# Patient Record
Sex: Female | Born: 1975 | Race: Black or African American | Hispanic: No | Marital: Single | Smoking: Current every day smoker
Health system: Southern US, Community
[De-identification: ages and names within clinical notes are randomized; demographics above are authoritative.]

## PROBLEM LIST (undated history)

## (undated) DIAGNOSIS — R51 Headache: Secondary | ICD-10-CM

## (undated) DIAGNOSIS — K859 Acute pancreatitis without necrosis or infection, unspecified: Secondary | ICD-10-CM

## (undated) DIAGNOSIS — K219 Gastro-esophageal reflux disease without esophagitis: Secondary | ICD-10-CM

## (undated) DIAGNOSIS — F32A Depression, unspecified: Secondary | ICD-10-CM

## (undated) DIAGNOSIS — F419 Anxiety disorder, unspecified: Secondary | ICD-10-CM

## (undated) DIAGNOSIS — D649 Anemia, unspecified: Secondary | ICD-10-CM

## (undated) DIAGNOSIS — R519 Headache, unspecified: Secondary | ICD-10-CM

## (undated) DIAGNOSIS — J45909 Unspecified asthma, uncomplicated: Secondary | ICD-10-CM

## (undated) DIAGNOSIS — M5430 Sciatica, unspecified side: Secondary | ICD-10-CM

## (undated) DIAGNOSIS — I10 Essential (primary) hypertension: Secondary | ICD-10-CM

## (undated) DIAGNOSIS — F40240 Claustrophobia: Secondary | ICD-10-CM

## (undated) DIAGNOSIS — M1990 Unspecified osteoarthritis, unspecified site: Secondary | ICD-10-CM

## (undated) DIAGNOSIS — C801 Malignant (primary) neoplasm, unspecified: Secondary | ICD-10-CM

## (undated) DIAGNOSIS — F329 Major depressive disorder, single episode, unspecified: Secondary | ICD-10-CM

## (undated) DIAGNOSIS — T7840XA Allergy, unspecified, initial encounter: Secondary | ICD-10-CM

## (undated) DIAGNOSIS — L0591 Pilonidal cyst without abscess: Secondary | ICD-10-CM

## (undated) HISTORY — DX: Allergy, unspecified, initial encounter: T78.40XA

## (undated) HISTORY — PX: SENTINEL LYMPH NODE BIOPSY: SHX2392

## (undated) HISTORY — PX: TUBAL LIGATION: SHX77

## (undated) HISTORY — PX: CYST EXCISION: SHX5701

## (undated) HISTORY — PX: CHOLECYSTECTOMY: SHX55

## (undated) HISTORY — DX: Essential (primary) hypertension: I10

## (undated) HISTORY — PX: ABDOMINAL HYSTERECTOMY: SHX81

## (undated) HISTORY — PX: HERNIA REPAIR: SHX51

---

## 1998-03-09 ENCOUNTER — Emergency Department (HOSPITAL_COMMUNITY): Admission: EM | Admit: 1998-03-09 | Discharge: 1998-03-09 | Payer: Self-pay | Admitting: Emergency Medicine

## 1998-03-09 ENCOUNTER — Encounter: Payer: Self-pay | Admitting: Emergency Medicine

## 1998-07-06 ENCOUNTER — Emergency Department (HOSPITAL_COMMUNITY): Admission: EM | Admit: 1998-07-06 | Discharge: 1998-07-06 | Payer: Self-pay | Admitting: Emergency Medicine

## 2008-11-05 ENCOUNTER — Emergency Department: Payer: Self-pay | Admitting: Emergency Medicine

## 2009-02-08 ENCOUNTER — Emergency Department: Payer: Self-pay | Admitting: Emergency Medicine

## 2009-05-13 ENCOUNTER — Emergency Department: Payer: Self-pay | Admitting: Emergency Medicine

## 2009-08-01 ENCOUNTER — Ambulatory Visit: Payer: Self-pay | Admitting: Family Medicine

## 2009-09-27 ENCOUNTER — Emergency Department: Payer: Self-pay | Admitting: Emergency Medicine

## 2012-02-03 ENCOUNTER — Emergency Department: Payer: Self-pay | Admitting: Emergency Medicine

## 2012-02-03 LAB — BASIC METABOLIC PANEL
Anion Gap: 6 — ABNORMAL LOW (ref 7–16)
BUN: 11 mg/dL (ref 7–18)
Calcium, Total: 9 mg/dL (ref 8.5–10.1)
Chloride: 105 mmol/L (ref 98–107)
Co2: 27 mmol/L (ref 21–32)
Creatinine: 1.14 mg/dL (ref 0.60–1.30)
EGFR (African American): >60
EGFR (Non-African Amer.): >60
Glucose: 89 mg/dL (ref 65–99)
Osmolality: 275 (ref 275–301)
Potassium: 3.4 mmol/L — ABNORMAL LOW (ref 3.5–5.1)
Sodium: 138 mmol/L (ref 136–145)

## 2012-02-03 LAB — CBC
HCT: 38.8 % (ref 35.0–47.0)
HGB: 13.1 g/dL (ref 12.0–16.0)
MCH: 29.4 pg (ref 26.0–34.0)
MCHC: 33.8 g/dL (ref 32.0–36.0)
MCV: 87 fL (ref 80–100)
Platelet: 503 10*3/uL — ABNORMAL HIGH (ref 150–440)
RBC: 4.46 10*6/uL (ref 3.80–5.20)
RDW: 13.5 % (ref 11.5–14.5)
WBC: 9.4 10*3/uL (ref 3.6–11.0)

## 2012-02-03 LAB — CK TOTAL AND CKMB (NOT AT ARMC)
CK, Total: 260 U/L — ABNORMAL HIGH (ref 21–215)
CK-MB: <0.5 ng/mL — ABNORMAL LOW (ref 0.5–3.6)

## 2012-02-03 LAB — TROPONIN I: Troponin-I: <0.02 ng/mL

## 2012-08-25 ENCOUNTER — Emergency Department: Payer: Self-pay | Admitting: Emergency Medicine

## 2012-08-25 LAB — URINALYSIS, COMPLETE
Bacteria: NONE SEEN
Bilirubin,UR: NEGATIVE
Blood: NEGATIVE
Glucose,UR: NEGATIVE mg/dL (ref 0–75)
Ketone: NEGATIVE
Leukocyte Esterase: NEGATIVE
Nitrite: NEGATIVE
Ph: 7 (ref 4.5–8.0)
Protein: NEGATIVE
RBC,UR: <1 /HPF (ref 0–5)
Specific Gravity: 1.004 (ref 1.003–1.030)
Squamous Epithelial: 1
WBC UR: <1 /HPF (ref 0–5)

## 2012-08-25 LAB — CBC
HCT: 35.9 % (ref 35.0–47.0)
HGB: 12.7 g/dL (ref 12.0–16.0)
MCH: 30.7 pg (ref 26.0–34.0)
MCHC: 35.3 g/dL (ref 32.0–36.0)
MCV: 87 fL (ref 80–100)
Platelet: 374 10*3/uL (ref 150–440)
RBC: 4.12 10*6/uL (ref 3.80–5.20)
RDW: 13 % (ref 11.5–14.5)
WBC: 7.6 10*3/uL (ref 3.6–11.0)

## 2012-08-25 LAB — COMPREHENSIVE METABOLIC PANEL
Albumin: 4.1 g/dL (ref 3.4–5.0)
Alkaline Phosphatase: 62 U/L (ref 50–136)
Anion Gap: 5 — ABNORMAL LOW (ref 7–16)
BUN: 9 mg/dL (ref 7–18)
Bilirubin,Total: 0.3 mg/dL (ref 0.2–1.0)
Calcium, Total: 9.4 mg/dL (ref 8.5–10.1)
Chloride: 103 mmol/L (ref 98–107)
Co2: 30 mmol/L (ref 21–32)
Creatinine: 0.8 mg/dL (ref 0.60–1.30)
EGFR (African American): >60
EGFR (Non-African Amer.): >60
Glucose: 77 mg/dL (ref 65–99)
Osmolality: 273 (ref 275–301)
Potassium: 3.2 mmol/L — ABNORMAL LOW (ref 3.5–5.1)
SGOT(AST): 32 U/L (ref 15–37)
SGPT (ALT): 27 U/L (ref 12–78)
Sodium: 138 mmol/L (ref 136–145)
Total Protein: 8.1 g/dL (ref 6.4–8.2)

## 2012-08-25 LAB — TSH: Thyroid Stimulating Horm: 1.16 u[IU]/mL

## 2012-08-25 LAB — TROPONIN I: Troponin-I: <0.02 ng/mL

## 2012-12-29 ENCOUNTER — Emergency Department: Payer: Self-pay | Admitting: Emergency Medicine

## 2013-06-19 ENCOUNTER — Emergency Department: Payer: Self-pay | Admitting: Emergency Medicine

## 2013-10-13 ENCOUNTER — Emergency Department: Payer: Self-pay

## 2014-01-30 ENCOUNTER — Emergency Department: Payer: Self-pay | Admitting: Emergency Medicine

## 2014-03-23 ENCOUNTER — Emergency Department: Payer: Self-pay | Admitting: Emergency Medicine

## 2014-06-03 ENCOUNTER — Emergency Department: Payer: Medicaid Other

## 2014-06-03 ENCOUNTER — Emergency Department
Admission: EM | Admit: 2014-06-03 | Discharge: 2014-06-03 | Disposition: A | Discharge status: Home / Self Care | Payer: Medicaid Other | Attending: Emergency Medicine | Admitting: Emergency Medicine

## 2014-06-03 DIAGNOSIS — S93401A Sprain of unspecified ligament of right ankle, initial encounter: Secondary | ICD-10-CM | POA: Insufficient documentation

## 2014-06-03 DIAGNOSIS — Y9289 Other specified places as the place of occurrence of the external cause: Secondary | ICD-10-CM | POA: Diagnosis not present

## 2014-06-03 DIAGNOSIS — Y998 Other external cause status: Secondary | ICD-10-CM | POA: Diagnosis not present

## 2014-06-03 DIAGNOSIS — Y9389 Activity, other specified: Secondary | ICD-10-CM | POA: Diagnosis not present

## 2014-06-03 DIAGNOSIS — S82445A Nondisplaced spiral fracture of shaft of left fibula, initial encounter for closed fracture: Secondary | ICD-10-CM | POA: Diagnosis not present

## 2014-06-03 DIAGNOSIS — W109XXA Fall (on) (from) unspecified stairs and steps, initial encounter: Secondary | ICD-10-CM | POA: Diagnosis not present

## 2014-06-03 DIAGNOSIS — S99912A Unspecified injury of left ankle, initial encounter: Secondary | ICD-10-CM | POA: Diagnosis present

## 2014-06-03 MED ORDER — OXYCODONE-ACETAMINOPHEN 5-325 MG PO TABS: 1.0000 | ORAL_TABLET | Freq: Four times a day (QID) | ORAL | Status: DC | PRN

## 2014-06-03 MED ORDER — OXYCODONE-ACETAMINOPHEN 5-325 MG PO TABS
ORAL_TABLET | ORAL | Status: AC
  Administered 2014-06-03: 1 via ORAL
  Filled 2014-06-03: qty 1

## 2014-06-03 MED ORDER — OXYCODONE-ACETAMINOPHEN 5-325 MG PO TABS
1.0000 | ORAL_TABLET | Freq: Once | ORAL | Status: AC
  Administered 2014-06-03: 1 via ORAL

## 2014-06-03 NOTE — Discharge Instructions (Signed)
Fibular Fracture, Ankle, Adult, Undisplaced, Treated With Immobilization °A simple fracture of the bone below the knee on the outside of your leg (fibula) usually heals without problems. °CAUSES °Typically, a fibular fracture occurs as a result of trauma. A blow to the side of your leg or a powerful twisting movement can cause a fracture. Fibular fractures are often seen as a result of football, soccer, or skiing injuries. °SYMPTOMS °Symptoms of a fibular fracture can include: °· Pain. °· Shortening or abnormal alignment of your lower leg (angulation). °DIAGNOSIS °A health care provider will need to examine the leg. X-ray exams will be ordered for further to confirm the fracture and evaluate the extent and of the injury. °TREATMENT  °Typically, a cast or immobilizer is applied. Sometimes a splint is placed on these fractures if it is needed for comfort or if the bones are badly out of place. Crutches may be needed to help you get around.  °HOME CARE INSTRUCTIONS  °· Apply ice to the injured area: °¨ Put ice in a plastic bag. °¨ Place a towel between your skin and the bag. °¨ Leave the ice on for 20 minutes, 2-3 times a day. °· Use crutches as directed. Resume walking without crutches as directed by your health care provider or when comfortable doing so. °· Only take over-the-counter or prescription medicines for pain, discomfort, or fever as directed by your health care provider. °· Keeping your leg raised may lessen swelling. °· If you have a removable splint or boot, do not remove the boot unless directed by your health care provider. °· Do not not drive a car or operate a motor vehicle until your health care provider specifically tells you it is safe to do so. °SEEK IMMEDIATE MEDICAL CARE IF:  °· Your cast gets damaged or breaks. °· You have continued severe pain or more swelling than you did before the cast was put on, or the pain is not controlled with medications. °· Your skin or nails below the injury turn  blue or grey, or feel cold or numb. °· There is a bad smell or pus coming from under the cast. °· You develop severe pain in ankle or foot. °MAKE SURE YOU:  °· Understand these instructions. °· Will watch your condition. °· Will get help right away if you are not doing well or get worse. °Document Released: 10/03/2001 Document Revised: 11/01/2012 Document Reviewed: 08/23/2012 °[employer]® Patient Information ©2015 [employer]. This information is not intended to replace advice given to you by your health care provider. Make sure you discuss any questions you have with your health care provider. ° °

## 2014-06-03 NOTE — ED Notes (Signed)
Called patient to go back to a room

## 2014-06-03 NOTE — ED Notes (Signed)
Pt c/o left ankle pain , states she fell down some step yesterday

## 2014-06-03 NOTE — ED Provider Notes (Signed)
[facility] Emergency Department Provider Note  ____________________________________________  Time seen: 1:45 PM  I have reviewed the triage vital signs and the nursing notes.   HISTORY  Chief Complaint Ankle Pain      HPI Aylin L Parrales is a 39 y.o. female who presents with bilateral ankle pain after reportedly falling down the stairs last night. She is able to walk but it is painful. Her left ankle is swollen and more painful than the right. She describes the pain as moderate to severe and sharp in nature.     No past medical history on file.  There are no active problems to display for this patient.   No past surgical history on file.  No current outpatient prescriptions on file.  Allergies Review of patient's allergies indicates not on file.  No family history on file.  Social History History  Substance Use Topics  . Smoking status: Not on file  . Smokeless tobacco: Not on file  . Alcohol Use: Not on file    Review of Systems  Constitutional: Negative for fever. Eyes: Negative for visual changes. ENT: Negative for sore throat. Cardiovascular: Negative for chest pain. Respiratory: Negative for shortness of breath. Gastrointestinal: Negative for abdominal pain, vomiting and diarrhea. Genitourinary: Negative for dysuria. Musculoskeletal: Negative for back pain. Positive for ankle pain Skin: Negative for rash. Neurological: Negative for headaches, focal weakness or numbness. Psychiatric: No anxiety  10-point ROS otherwise negative.  ____________________________________________   PHYSICAL EXAM:  VITAL SIGNS: ED Triage Vitals  Enc Vitals Group     BP 06/03/14 1218 138/97 mmHg     Pulse Rate 06/03/14 1218 68     Resp 06/03/14 1218 16     Temp 06/03/14 1218 98.6 F (37 C)     Temp Source 06/03/14 1218 Oral     SpO2 06/03/14 1218 97 %     Weight 06/03/14 1218 180 lb (81.647 kg)     Height 06/03/14 1218 5\' 5"  (1.651 m)      Head Cir --      Peak Flow --      Pain Score 06/03/14 1220 10     Pain Loc --      Pain Edu? --      Excl. in [place]? --      Constitutional: Alert and oriented. Well appearing and in no distress. Eyes: Conjunctivae are normal. PERRL. Normal extraocular movements. ENT   Head: Normocephalic and atraumatic.   Nose: No congestion/rhinnorhea.   Mouth/Throat: Mucous membranes are moist.   Neck: No stridor. Hematological/Lymphatic/Immunilogical: No cervical lymphadenopathy. Cardiovascular: Normal rate, regular rhythm. Normal and symmetric distal pulses are present in all extremities. No murmurs, rubs, or gallops. Respiratory: Normal respiratory effort without tachypnea nor retractions. Breath sounds are clear and equal bilaterally. No wheezes/rales/rhonchi. Gastrointestinal: Soft and nontender. No distention. There is no CVA tenderness. Genitourinary: deferred Musculoskeletal: Left ankle with swelling and tenderness to palpation over the  lateral ankle. Right ankle no swelling but tenderness to palpation of the lateral ankle. Full range of motion although painful able to bear weight. Neurologic:  Normal speech and language. No gross focal neurologic deficits are appreciated. Speech is normal.  Skin:  Skin is warm, dry and intact. No rash noted. Psychiatric: Mood and affect are normal. Speech and behavior are normal. Patient exhibits appropriate insight and judgment.  ____________________________________________    LABS (pertinent positives/negatives)    ____________________________________________   EKG    ____________________________________________    RADIOLOGY  Right ankle  no acute distress Left ankle spiral fracture  ____________________________________________   PROCEDURES  Procedure(s) performed: yes SPLINT APPLICATION Date/Time: 0:37 PM Authorized by: Lavonia Drafts Consent: Verbal consent obtained. Risks and benefits: risks, benefits and  alternatives were discussed Consent given by: patient Splint applied by: me Location details: left ankle Splint type: posterior and sugar tong Supplies used: orthoglass Post-procedure: The splinted body part was neurovascularly unchanged following the procedure. Patient tolerance: Patient tolerated the procedure well with no immediate complications.     Critical Care performed: no  ____________________________________________   INITIAL IMPRESSION / ASSESSMENT AND PLAN / ED COURSE  Pertinent labs & imaging results that were available during my care of the patient were reviewed by me and considered in my medical decision making (see chart for details).  Patient with left ankle fracture, will require splint and crutches and orthopedic follow-up as well as analgesics. Patient made aware  ____________________________________________   FINAL CLINICAL IMPRESSION(S) / ED DIAGNOSES  Final diagnoses:  Right ankle sprain, initial encounter  Nondisplaced spiral fracture of shaft of left fibula, closed, initial encounter     Lavonia Drafts, MD 06/03/14 1444

## 2014-06-03 NOTE — ED Notes (Signed)
Pt unable to sign d/c instructions.  No e signature available in room 30

## 2015-04-13 ENCOUNTER — Emergency Department: Payer: Medicaid Other

## 2015-04-13 ENCOUNTER — Emergency Department
Admission: EM | Admit: 2015-04-13 | Discharge: 2015-04-13 | Disposition: A | Discharge status: Home / Self Care | Payer: Medicaid Other | Attending: Emergency Medicine | Admitting: Emergency Medicine

## 2015-04-13 DIAGNOSIS — R1033 Periumbilical pain: Secondary | ICD-10-CM | POA: Diagnosis not present

## 2015-04-13 DIAGNOSIS — Y998 Other external cause status: Secondary | ICD-10-CM | POA: Insufficient documentation

## 2015-04-13 DIAGNOSIS — Y9389 Activity, other specified: Secondary | ICD-10-CM | POA: Insufficient documentation

## 2015-04-13 DIAGNOSIS — S6992XA Unspecified injury of left wrist, hand and finger(s), initial encounter: Secondary | ICD-10-CM | POA: Diagnosis not present

## 2015-04-13 DIAGNOSIS — W228XXA Striking against or struck by other objects, initial encounter: Secondary | ICD-10-CM | POA: Insufficient documentation

## 2015-04-13 DIAGNOSIS — R1013 Epigastric pain: Secondary | ICD-10-CM | POA: Insufficient documentation

## 2015-04-13 DIAGNOSIS — Z3202 Encounter for pregnancy test, result negative: Secondary | ICD-10-CM | POA: Diagnosis not present

## 2015-04-13 DIAGNOSIS — Y9289 Other specified places as the place of occurrence of the external cause: Secondary | ICD-10-CM | POA: Diagnosis not present

## 2015-04-13 LAB — COMPREHENSIVE METABOLIC PANEL
ALT: 19 U/L (ref 14–54)
ANION GAP: 5 (ref 5–15)
AST: 20 U/L (ref 15–41)
Albumin: 4.2 g/dL (ref 3.5–5.0)
Alkaline Phosphatase: 54 U/L (ref 38–126)
BUN: 9 mg/dL (ref 6–20)
CHLORIDE: 109 mmol/L (ref 101–111)
CO2: 23 mmol/L (ref 22–32)
CREATININE: 0.89 mg/dL (ref 0.44–1.00)
Calcium: 8.8 mg/dL — ABNORMAL LOW (ref 8.9–10.3)
GFR calc non Af Amer: >60 mL/min (ref >60)
Glucose, Bld: 90 mg/dL (ref 65–99)
POTASSIUM: 3.7 mmol/L (ref 3.5–5.1)
Sodium: 137 mmol/L (ref 135–145)
Total Bilirubin: <0.1 mg/dL — ABNORMAL LOW (ref 0.3–1.2)
Total Protein: 7.5 g/dL (ref 6.5–8.1)

## 2015-04-13 LAB — CBC
HCT: 34.9 % — ABNORMAL LOW (ref 35.0–47.0)
HEMOGLOBIN: 11.9 g/dL — AB (ref 12.0–16.0)
MCH: 30.3 pg (ref 26.0–34.0)
MCHC: 34.1 g/dL (ref 32.0–36.0)
MCV: 88.9 fL (ref 80.0–100.0)
Platelets: 352 10*3/uL (ref 150–440)
RBC: 3.93 MIL/uL (ref 3.80–5.20)
RDW: 13.8 % (ref 11.5–14.5)
WBC: 9.9 10*3/uL (ref 3.6–11.0)

## 2015-04-13 LAB — URINALYSIS COMPLETE WITH MICROSCOPIC (ARMC ONLY)
BACTERIA UA: NONE SEEN
Bilirubin Urine: NEGATIVE
Glucose, UA: NEGATIVE mg/dL
HGB URINE DIPSTICK: NEGATIVE
Ketones, ur: NEGATIVE mg/dL
Nitrite: NEGATIVE
PH: 6 (ref 5.0–8.0)
Protein, ur: NEGATIVE mg/dL
Specific Gravity, Urine: 1.019 (ref 1.005–1.030)

## 2015-04-13 LAB — POCT PREGNANCY, URINE: Preg Test, Ur: NEGATIVE

## 2015-04-13 LAB — LIPASE, BLOOD: LIPASE: 20 U/L (ref 11–51)

## 2015-04-13 NOTE — Discharge Instructions (Signed)

## 2015-04-13 NOTE — ED Provider Notes (Signed)
Time Seen: Approximately ----------------------------------------- 7:36 PM on 04/13/2015 -----------------------------------------    I have reviewed the triage notes  Chief Complaint: Abdominal Pain   History of Present Illness: Cassidy Mathis is a 40 y.o. female who presents with multiple concerns including some. Umbilical abdominal pain toward the epigastric area that's been occurring now for the last couple weeks. She states she's noticed the pain more with lifting and straining. She states she's also noticed a knot over this area which seems to come and go and was concerned because of some consistent pain. Patient states the pain was worse last night and actually at the time of our evaluation is feeling symptomatically improved. He denies any persistent nausea, vomiting, fever. She denies any lower abdominal pain.  Patient's secondary complaint is some pain in her middle finger. She states she punched something approximately a week ago and is noticed some persistent pain and swelling over the proximal interphalangeal joint of her left hand.   No past medical history on file.  There are no active problems to display for this patient.   No past surgical history on file. None No past surgical history on file.  Current Outpatient Rx  Name  Route  Sig  Dispense  Refill  . oxyCODONE-acetaminophen (PERCOCET/ROXICET) 5-325 MG per tablet   Oral   Take 1 tablet by mouth every 6 (six) hours as needed for severe pain.   24 tablet   0     Allergies:  Review of patient's allergies indicates no known allergies.  Family History: Mother had a history of gallstones Social History: Social History  Substance Use Topics  . Smoking status: Not on file  . Smokeless tobacco: Not on file  . Alcohol Use: Not on file    Review of Systems:   10 point review of systems was performed and was otherwise negative:  Constitutional: No fever Eyes: No visual disturbances ENT: No sore  throat, ear pain Cardiac: No chest pain Respiratory: No shortness of breath, wheezing, or stridor Abdomen: Abdominal pain is primarily toward an area above the umbilicus without any radiation of pain to the back or flank area. Endocrine: No weight loss, No night sweats Extremities: No peripheral edema, cyanosis Skin: No rashes, easy bruising Neurologic: No focal weakness, trouble with speech or swollowing Urologic: No dysuria, Hematuria, or urinary frequency   Physical Exam:  ED Triage Vitals  Enc Vitals Group     BP 04/13/15 1644 166/99 mmHg     Pulse Rate 04/13/15 1644 70     Resp 04/13/15 1644 18     Temp 04/13/15 1644 98.8 F (37.1 C)     Temp Source 04/13/15 1644 Oral     SpO2 04/13/15 1644 100 %     Weight 04/13/15 1644 180 lb (81.647 kg)     Height 04/13/15 1644 5\' 5"  (1.651 m)     Head Cir --      Peak Flow --      Pain Score 04/13/15 1645 8     Pain Loc --      Pain Edu? --      Excl. in [place]? --     General: Awake , Alert , and Oriented times 3; GCS 15 Head: Normal cephalic , atraumatic Eyes: Pupils equal , round, reactive to light Nose/Throat: No nasal drainage, patent upper airway without erythema or exudate.  Neck: Supple, Full range of motion, No anterior adenopathy or palpable thyroid masses Lungs: Clear to ascultation without wheezes , rhonchi,  or rales Heart: Regular rate, regular rhythm without murmurs , gallops , or rubs Abdomen: Soft, mild tenderness toward the left upper and epigastric area without  rebound, guarding , or rigidity; bowel sounds positive and symmetric in all 4 quadrants. No organomegaly .       Negative Murphy's sign. No tenderness over McBurney's point. No palpable hernia at this time Extremities: Patient has some tenderness over the left middle finger at the proximal joint without any crepitus or step-off noted and neurovascularly intact. Tenderness does extend to the distal interphalangeal joint. Neurologic: normal ambulation, Motor  symmetric without deficits, sensory intact Skin: warm, dry, no rashes   Labs:   All laboratory work was reviewed including any pertinent negatives or positives listed below:  Labs Reviewed  COMPREHENSIVE METABOLIC PANEL - Abnormal; Notable for the following:    Calcium 8.8 (*)    Total Bilirubin <0.1 (*)    All other components within normal limits  CBC - Abnormal; Notable for the following:    Hemoglobin 11.9 (*)    HCT 34.9 (*)    All other components within normal limits  URINALYSIS COMPLETEWITH MICROSCOPIC ([facility] ONLY) - Abnormal; Notable for the following:    Color, Urine YELLOW (*)    APPearance HAZY (*)    Leukocytes, UA TRACE (*)    Squamous Epithelial / LPF 6-30 (*)    All other components within normal limits  LIPASE, BLOOD  POC URINE PREG, ED  POCT PREGNANCY, URINE  Laboratory work was reviewed and showed no clinically significant abnormalities.     Radiology:  CLINICAL DATA: Epigastric pain  EXAM: US ABDOMEN LIMITED - RIGHT UPPER QUADRANT  COMPARISON: None.  FINDINGS: Gallbladder:  Increased echogenicity is noted along the margin of the gallbladder consistent with a contracted gallbladder with stones. No pericholecystic fluid is noted. Negative sonographic Murphy's sign.  Common bile duct:  Diameter: 4 mm.  Liver:  No focal lesion identified. Within normal limits in parenchymal echogenicity.  IMPRESSION: Echogenic gallstones within a decompressed gallbladder.   Electronically Signed By: Inez Catalina M.D. On: 04/13/2015 18:33          DG Finger Middle Left (Final result) Result time: 04/13/15 18:21:25   Final result by Rad Results In Interface (04/13/15 18:21:25)   Narrative:   CLINICAL DATA: Middle finger pain after punching a wall several days ago.  EXAM: LEFT MIDDLE FINGER 2+V  COMPARISON: None.  FINDINGS: Faint oblique linear lucency along the radial metaphysis of the middle phalanx of the middle finger on the  oblique view only is not corroborated on the other views and there is no significant adjacent soft tissue swelling, so I suspect this is likely incidental/artifact rather than a fracture. Correlate with point tenderness.  No dislocation or foreign body. No additional significant findings.  IMPRESSION: 1. I do not see a definite fracture. Faint linear lucency along the distal metaphysis of the middle phalanx is only suggested on a single view and not corroborated on the other views or by adjacent soft tissue swelling, so is probably artifact. If the patient happens to have point tenderness exactly in this location, then the possibility of a nondisplaced fracture might be increased in likelihood.           I personally reviewed the radiologic studies     ED Course: * Patient's stay was uneventful and she really didn't have much in way of significant pain on exam. I felt given her clinical history she may have an umbilical type hernia  that may be intermittent and reducible. There certainly doesn't appear to be any signs of entrapment or incarceration. Have gallstones on her ultrasound but no evidence of acute cholecystitis at this time. The patient was referred to general surgery due to both of her concerns. She was advised to return here if she develops a fever increased pain that's unrelieved with over-the-counter pain medications, bloody stool, or any other concerns. She was discharged with instructions on abdominal pain in a female patient along with instructions on hernia and gallstones.  Fourth finger if there is a fracture she was advised for pain control to buddy tape her fingers together at this point. He felt she did not require splint or casting. The bones are well aligned.    Assessment: * Acute unspecified epigastric abdominal pain Possible nondisplaced finger fracture  Final Clinical Impression  Final diagnoses:  Epigastric abdominal pain     Plan:    Outpatient management Patient was advised to return immediately if condition worsens. Patient was advised to follow up with their primary care physician or other specialized physicians involved in their outpatient care. The patient and/or family member/power of attorney had laboratory results reviewed at the bedside. All questions and concerns were addressed and appropriate discharge instructions were distributed by the nursing staff. Patient was consulted on the logistics of not prescribing narcotic pain medication just using over-the-counter pain meds at this time. We discussed the creation of constipation and side effects with using narcotic pain medication.           Daymon Larsen, MD 04/13/15 [phone]

## 2015-04-13 NOTE — ED Notes (Signed)
Pt states a couple weeks ago she noticed a large knot to the lower right side of her abd, pt states it has been painful intermittently. Pt also reports diarrhea intermittently.

## 2015-04-13 NOTE — ED Notes (Signed)
Pt back from x-ray.

## 2015-04-14 ENCOUNTER — Ambulatory Visit: Payer: Self-pay | Admitting: Surgery

## 2015-04-15 ENCOUNTER — Ambulatory Visit (INDEPENDENT_AMBULATORY_CARE_PROVIDER_SITE_OTHER): Payer: Medicaid Other | Admitting: Surgery

## 2015-04-15 ENCOUNTER — Encounter: Payer: Self-pay | Admitting: Surgery

## 2015-04-15 VITALS — BP 146/98 | HR 70 | Temp 98.4°F | Ht 65.0 in | Wt 188.0 lb

## 2015-04-15 DIAGNOSIS — K436 Other and unspecified ventral hernia with obstruction, without gangrene: Secondary | ICD-10-CM | POA: Diagnosis not present

## 2015-04-15 NOTE — Progress Notes (Signed)
Patient ID: Cassidy Mathis, female   DOB: 12-21-1975, 40 y.o.   MRN: [id number]  History of Present Illness Cassidy Mathis is a 40 y.o. female with acute onset of abdominal pain for last couple weeks. She recently went to the emergency room where workup revealed some evidence of gallstones. She describes that her pain is actually on top of her umbilicus, is sharp and constant and is moderate to severe in intensity. And there is increasing pain when she moves around and there is no specific alleviating factors. She is able to perform more than 4 Mets of activity without shortness of breath or chest pain. She denies any previous abdominal surgeries. She denies any evidence of biliary obstruction or gallbladder related symptoms.  Past Medical History Past Medical History  Diagnosis Date  . Hypertension      Past Surgical History  Procedure Laterality Date  . Cyst excision Left 1998    wrist  . Cyst excision  1998    tongue    No Known Allergies  Current Outpatient Prescriptions  Medication Sig Dispense Refill  . lisinopril-hydrochlorothiazide (PRINZIDE,ZESTORETIC) 20-25 MG tablet Take 1 tablet by mouth 1 day or 1 dose.  0   No current facility-administered medications for this visit.    Family History Family History  Problem Relation Age of Onset  . Hypertension Mother   . Kidney Stones Mother   . Gallstones Mother   . Hypertension Father   . Gout Father   . Cancer Paternal Uncle     lung  . Cancer Maternal Grandmother     not sure what type of cancer  . Cancer Maternal Grandfather     not sure what type of cancer  . Cancer Paternal Grandmother     not sure what type of cancer  . Cancer Paternal Grandfather     not sure what type of cancer     Social History Social History  Substance Use Topics  . Smoking status: Current Every Day Smoker  . Smokeless tobacco: Never Used  . Alcohol Use: 0.0 oz/week    0 Standard drinks or equivalent per week     Comment: Barely       ROS 10 point  review of system was performed otherwise negative other than what is stated in the history of present illness   Physical Exam Blood pressure 146/98, pulse 70, temperature 98.4 F (36.9 C), temperature source Oral, height 5\' 5"  (1.651 m), weight 85.276 kg (188 lb), last menstrual period 04/15/2015.  CONSTITUTIONAL: No acute distress awake alert well-developed EYES: Pupils equal, round, and reactive to light, Sclera non-icteric. EARS, NOSE, MOUTH AND THROAT: The oropharynx is clear. Oral mucosa is pink and moist. Hearing is intact to voice.  NECK: Trachea is midline, and there is no jugular venous distension. Thyroid is without palpable abnormalities. LYMPH NODES:  Lymph nodes in the neck are not enlarged. RESPIRATORY:  Lungs are clear, and breath sounds are equal bilaterally. Normal respiratory effort without pathologic use of accessory muscles. CARDIOVASCULAR: Heart is regular without murmurs, gallops, or rubs. GI: The abdomen is  soft, tender incarcerated Epigastric hernia , non distended, no peritonitis. There was no hepatosplenomegaly. There were normal bowel sounds. MUSCULOSKELETAL:  Normal muscle strength and tone in all four extremities.    SKIN: Skin turgor is normal. There are no pathologic skin lesions.  NEUROLOGIC:  Motor and sensation is grossly normal.  Cranial nerves are grossly intact. PSYCH:  Alert and oriented to person, place and time.  Affect is normal.  Data Reviewed  I have personally reviewed the patient's imaging and medical records.    Assessment/Plan Incarcerated epigastric hernia likely omentum or preperitoneal fat given her clinical presentation. Oh evidence of bowel compromise. She has been hurting for a couple weeks and had a normal white count. Now she does have a history of cholelithiasis but these are asymptomatic. Her current symptoms are definitely related to the epigastric hernia and not to her gallbladder. Had a lengthy discussion about  the patient about options and one of the doing simultaneous laparoscopic cholecystectomy and hernia repair versus doing only the hernia repair. I did explain to her that this symptomatic issue is the epigastric hernia and actually recommended to have this performed only so we can safely placed mesh. She and she understands that in the future her gallstones may become symptomatic at that time we'll we'll address that with a cholecystectomy. At this point I only thing that she needs the epigastric hernia repair. Since it counseling provided. I have explained the procedure, risks, and aftercare of  hernia repair to Cassidy Mathis.   Risks include but are not limited to bleeding, infection, wound problems, anesthesia, recurrence, bladder or intestine injury, urinary retention, chronic pain, mesh problems.  She  seems to understand and agrees to proceed.  Questions were answered to her stated satisfaction.   Cassidy Hamman, MD [place] 04/15/2015, 3:17 PM

## 2015-04-15 NOTE — Patient Instructions (Signed)
We will arrange for your surgery either 3/24 or 4/4. We are waiting to hear back from the Operating Room. As soon as your surgery date has been confirmed, our office will call you to let you know when to be here.  Please send your FMLA paperwork to our office. Fax: [phone]. We will fill this out the day of your surgery and return to your job. You are going to need to be out of work for 6 weeks following this surgery.  Please call with any questions or concerns.

## 2015-04-16 ENCOUNTER — Encounter: Payer: Self-pay | Admitting: *Deleted

## 2015-04-16 ENCOUNTER — Other Ambulatory Visit: Payer: Medicaid Other

## 2015-04-16 ENCOUNTER — Telehealth: Payer: Self-pay | Admitting: Surgery

## 2015-04-16 NOTE — Patient Instructions (Signed)
  Your procedure is scheduled on: 04-18-15 (FRIDAY) Report to [place] To find out your arrival time please call [phone] between 1PM - 3PM on 04-17-15 (THURSDAY)  Remember: Instructions that are not followed completely may result in serious medical risk, up to and including death, or upon the discretion of your surgeon and anesthesiologist your surgery may need to be rescheduled.    _X___ 1. Do not eat food or drink liquids after midnight. No gum chewing or hard candies.     _X___ 2. No Alcohol for 24 hours before or after surgery.   ____ 3. Bring all medications with you on the day of surgery if instructed.    _X___ 4. Notify your doctor if there is any change in your medical condition     (cold, fever, infections).     Do not wear jewelry, make-up, hairpins, clips or nail polish.  Do not wear lotions, powders, or perfumes. You may wear deodorant.  Do not shave 48 hours prior to surgery. Men may shave face and neck.  Do not bring valuables to the hospital.    [facility] is not responsible for any belongings or valuables.               Contacts, dentures or bridgework may not be worn into surgery.  Leave your suitcase in the car. After surgery it may be brought to your room.  For patients admitted to the hospital, discharge time is determined by your treatment team.   Patients discharged the day of surgery will not be allowed to drive home.   Please read over the following fact sheets that you were given:      ____ Take these medicines the morning of surgery with A SIP OF WATER:    1. NONE  2.   3.   4.  5.  6.  ____ Fleet Enema (as directed)   _X___ Use CHG Soap as directed  ____ Use inhalers on the day of surgery  ____ Stop metformin 2 days prior to surgery    ____ Take 1/2 of usual insulin dose the night before surgery and none on the morning of surgery.   ____ Stop Coumadin/Plavix/aspirin-N/A  ____ Stop Anti-inflammatories-NO  NSAIDS OR ASA PRODUCTS-TYLENOL OK TO TAKE   ____ Stop supplements until after surgery.    ____ Bring C-Pap to the hospital.

## 2015-04-16 NOTE — Telephone Encounter (Signed)
Pt advised of pre op date/time and sx date. Sx: 04/18/15 with Dr Carolin Coy epigastric hernia repair.  Pre op: 04/16/15 between 1-5:00pm--Phone.   Patient made aware to call [phone], between 1-3:00pm the day before surgery, to find out what time to arrive.

## 2015-04-17 ENCOUNTER — Encounter
Admission: RE | Admit: 2015-04-17 | Discharge: 2015-04-17 | Disposition: A | Discharge status: Home / Self Care | Payer: Medicaid Other | Source: Ambulatory Visit | Attending: Surgery | Admitting: Surgery

## 2015-04-17 DIAGNOSIS — I10 Essential (primary) hypertension: Secondary | ICD-10-CM | POA: Insufficient documentation

## 2015-04-17 DIAGNOSIS — Z01810 Encounter for preprocedural cardiovascular examination: Secondary | ICD-10-CM | POA: Insufficient documentation

## 2015-04-18 ENCOUNTER — Ambulatory Visit: Payer: Medicaid Other | Admitting: Anesthesiology

## 2015-04-18 ENCOUNTER — Encounter
Admission: RE | Disposition: A | Discharge status: Home / Self Care | Payer: Self-pay | Source: Ambulatory Visit | Attending: Surgery

## 2015-04-18 ENCOUNTER — Ambulatory Visit
Admission: RE | Admit: 2015-04-18 | Discharge: 2015-04-18 | Disposition: A | Discharge status: Home / Self Care | Payer: Medicaid Other | Source: Ambulatory Visit | Attending: Surgery | Admitting: Surgery

## 2015-04-18 ENCOUNTER — Encounter: Payer: Self-pay | Admitting: *Deleted

## 2015-04-18 DIAGNOSIS — Z8249 Family history of ischemic heart disease and other diseases of the circulatory system: Secondary | ICD-10-CM | POA: Insufficient documentation

## 2015-04-18 DIAGNOSIS — Z79899 Other long term (current) drug therapy: Secondary | ICD-10-CM | POA: Insufficient documentation

## 2015-04-18 DIAGNOSIS — K439 Ventral hernia without obstruction or gangrene: Secondary | ICD-10-CM | POA: Insufficient documentation

## 2015-04-18 DIAGNOSIS — Z841 Family history of disorders of kidney and ureter: Secondary | ICD-10-CM | POA: Insufficient documentation

## 2015-04-18 DIAGNOSIS — K436 Other and unspecified ventral hernia with obstruction, without gangrene: Secondary | ICD-10-CM | POA: Diagnosis not present

## 2015-04-18 DIAGNOSIS — Z801 Family history of malignant neoplasm of trachea, bronchus and lung: Secondary | ICD-10-CM | POA: Diagnosis not present

## 2015-04-18 DIAGNOSIS — Z809 Family history of malignant neoplasm, unspecified: Secondary | ICD-10-CM | POA: Insufficient documentation

## 2015-04-18 DIAGNOSIS — Z833 Family history of diabetes mellitus: Secondary | ICD-10-CM | POA: Insufficient documentation

## 2015-04-18 DIAGNOSIS — Z8349 Family history of other endocrine, nutritional and metabolic diseases: Secondary | ICD-10-CM | POA: Insufficient documentation

## 2015-04-18 DIAGNOSIS — F17200 Nicotine dependence, unspecified, uncomplicated: Secondary | ICD-10-CM | POA: Insufficient documentation

## 2015-04-18 DIAGNOSIS — K219 Gastro-esophageal reflux disease without esophagitis: Secondary | ICD-10-CM | POA: Diagnosis not present

## 2015-04-18 DIAGNOSIS — I10 Essential (primary) hypertension: Secondary | ICD-10-CM | POA: Insufficient documentation

## 2015-04-18 HISTORY — DX: Headache, unspecified: R51.9

## 2015-04-18 HISTORY — DX: Anemia, unspecified: D64.9

## 2015-04-18 HISTORY — DX: Major depressive disorder, single episode, unspecified: F32.9

## 2015-04-18 HISTORY — DX: Headache: R51

## 2015-04-18 HISTORY — DX: Depression, unspecified: F32.A

## 2015-04-18 HISTORY — PX: EPIGASTRIC HERNIA REPAIR: SHX404

## 2015-04-18 HISTORY — DX: Gastro-esophageal reflux disease without esophagitis: K21.9

## 2015-04-18 LAB — URINE DRUG SCREEN, QUALITATIVE (ARMC ONLY)
AMPHETAMINES, UR SCREEN: NOT DETECTED
BARBITURATES, UR SCREEN: NOT DETECTED
BENZODIAZEPINE, UR SCRN: NOT DETECTED
Cannabinoid 50 Ng, Ur ~~LOC~~: POSITIVE — AB
Cocaine Metabolite,Ur ~~LOC~~: NOT DETECTED
MDMA (Ecstasy)Ur Screen: NOT DETECTED
METHADONE SCREEN, URINE: NOT DETECTED
Opiate, Ur Screen: NOT DETECTED
Phencyclidine (PCP) Ur S: NOT DETECTED
TRICYCLIC, UR SCREEN: NOT DETECTED

## 2015-04-18 LAB — POCT PREGNANCY, URINE: PREG TEST UR: NEGATIVE

## 2015-04-18 SURGERY — REPAIR, HERNIA, EPIGASTRIC, ADULT
Anesthesia: General | Site: Abdomen | Wound class: Clean

## 2015-04-18 MED ORDER — FENTANYL CITRATE (PF) 100 MCG/2ML IJ SOLN
INTRAMUSCULAR | Status: DC | PRN
  Administered 2015-04-18: 100 ug via INTRAVENOUS

## 2015-04-18 MED ORDER — LIDOCAINE HCL (CARDIAC) 20 MG/ML IV SOLN
INTRAVENOUS | Status: DC | PRN
  Administered 2015-04-18: 60 mg via INTRAVENOUS

## 2015-04-18 MED ORDER — FENTANYL CITRATE (PF) 100 MCG/2ML IJ SOLN
25.0000 ug | INTRAMUSCULAR | Status: DC | PRN
  Administered 2015-04-18 (×3): 50 ug via INTRAVENOUS

## 2015-04-18 MED ORDER — BUPIVACAINE-EPINEPHRINE (PF) 0.25% -1:200000 IJ SOLN
INTRAMUSCULAR | Status: DC | PRN
  Administered 2015-04-18: 30 mL via PERINEURAL

## 2015-04-18 MED ORDER — CHLORHEXIDINE GLUCONATE 4 % EX LIQD: 1.0000 "application " | Freq: Once | CUTANEOUS | Status: DC

## 2015-04-18 MED ORDER — BUPIVACAINE-EPINEPHRINE (PF) 0.25% -1:200000 IJ SOLN
INTRAMUSCULAR | Status: AC
  Filled 2015-04-18: qty 30

## 2015-04-18 MED ORDER — MIDAZOLAM HCL 5 MG/5ML IJ SOLN
INTRAMUSCULAR | Status: DC | PRN
  Administered 2015-04-18: 2 mg via INTRAVENOUS

## 2015-04-18 MED ORDER — OXYCODONE-ACETAMINOPHEN 7.5-325 MG PO TABS
2.0000 | ORAL_TABLET | ORAL | Status: DC | PRN
Start: 2015-04-18 — End: 2015-05-07

## 2015-04-18 MED ORDER — KETOROLAC TROMETHAMINE 30 MG/ML IJ SOLN
INTRAMUSCULAR | Status: DC | PRN
  Administered 2015-04-18: 30 mg via INTRAVENOUS

## 2015-04-18 MED ORDER — CEFAZOLIN SODIUM-DEXTROSE 2-4 GM/100ML-% IV SOLN
2.0000 g | INTRAVENOUS | Status: DC
  Filled 2015-04-18: qty 100

## 2015-04-18 MED ORDER — LACTATED RINGERS IV SOLN
INTRAVENOUS | Status: DC
  Administered 2015-04-18 (×2): via INTRAVENOUS

## 2015-04-18 MED ORDER — ROCURONIUM BROMIDE 100 MG/10ML IV SOLN
INTRAVENOUS | Status: DC | PRN
  Administered 2015-04-18 (×2): 15 mg via INTRAVENOUS
  Administered 2015-04-18: 10 mg via INTRAVENOUS

## 2015-04-18 MED ORDER — FENTANYL CITRATE (PF) 100 MCG/2ML IJ SOLN
INTRAMUSCULAR | Status: AC
  Filled 2015-04-18: qty 2

## 2015-04-18 MED ORDER — ONDANSETRON HCL 4 MG/2ML IJ SOLN
INTRAMUSCULAR | Status: DC | PRN
  Administered 2015-04-18: 4 mg via INTRAVENOUS

## 2015-04-18 MED ORDER — SUGAMMADEX SODIUM 200 MG/2ML IV SOLN
INTRAVENOUS | Status: DC | PRN
  Administered 2015-04-18: 170 mg via INTRAVENOUS

## 2015-04-18 MED ORDER — FAMOTIDINE 20 MG PO TABS
20.0000 mg | ORAL_TABLET | Freq: Once | ORAL | Status: AC
Start: 2015-04-18 — End: 2015-04-18
  Administered 2015-04-18: 20 mg via ORAL

## 2015-04-18 MED ORDER — PROPOFOL 10 MG/ML IV BOLUS
INTRAVENOUS | Status: DC | PRN
  Administered 2015-04-18: 200 mg via INTRAVENOUS

## 2015-04-18 MED ORDER — FAMOTIDINE 20 MG PO TABS
ORAL_TABLET | ORAL | Status: AC
  Filled 2015-04-18: qty 1

## 2015-04-18 MED ORDER — SODIUM CHLORIDE 0.9 % IJ SOLN
INTRAMUSCULAR | Status: AC
  Filled 2015-04-18: qty 10

## 2015-04-18 MED ORDER — PROMETHAZINE HCL 25 MG/ML IJ SOLN
6.2500 mg | INTRAMUSCULAR | Status: DC | PRN
  Administered 2015-04-18: 12.5 mg via INTRAVENOUS

## 2015-04-18 MED ORDER — SUCCINYLCHOLINE CHLORIDE 20 MG/ML IJ SOLN
INTRAMUSCULAR | Status: DC | PRN
  Administered 2015-04-18: 120 mg via INTRAVENOUS

## 2015-04-18 MED ORDER — CEFAZOLIN SODIUM-DEXTROSE 2-3 GM-% IV SOLR
INTRAVENOUS | Status: AC
  Administered 2015-04-18: 2000 mg
  Filled 2015-04-18: qty 50

## 2015-04-18 MED ORDER — PROMETHAZINE HCL 25 MG/ML IJ SOLN
INTRAMUSCULAR | Status: AC
  Filled 2015-04-18: qty 1

## 2015-04-18 SURGICAL SUPPLY — 36 items
APPLIER CLIP 11 MED OPEN (CLIP)
APPLIER CLIP 13 LRG OPEN (CLIP)
APR CLP LRG 13 20 CLIP (CLIP)
APR CLP MED 11 20 MLT OPN (CLIP)
BLADE CLIPPER SURG (BLADE) ×2 IMPLANT
CANISTER SUCT 3000ML (MISCELLANEOUS) ×2 IMPLANT
CHLORAPREP W/TINT 26ML (MISCELLANEOUS) ×2 IMPLANT
CLIP APPLIE 11 MED OPEN (CLIP) ×1 IMPLANT
CLIP APPLIE 13 LRG OPEN (CLIP) ×1 IMPLANT
DRAPE INCISE IOBAN 66X45 STRL (DRAPES) ×1 IMPLANT
DRAPE LAPAROTOMY 100X77 ABD (DRAPES) ×3 IMPLANT
DRSG TELFA 3X8 NADH (GAUZE/BANDAGES/DRESSINGS) ×2 IMPLANT
ELECT REM PT RETURN 9FT ADLT (ELECTROSURGICAL) ×2
ELECTRODE REM PT RTRN 9FT ADLT (ELECTROSURGICAL) ×1 IMPLANT
GAUZE SPONGE 4X4 12PLY STRL (GAUZE/BANDAGES/DRESSINGS) ×2 IMPLANT
GLOVE BIO SURGEON STRL SZ7 (GLOVE) ×2 IMPLANT
GOWN STRL REUS W/ TWL LRG LVL3 (GOWN DISPOSABLE) ×2 IMPLANT
GOWN STRL REUS W/TWL LRG LVL3 (GOWN DISPOSABLE) ×4
LIQUID BAND (GAUZE/BANDAGES/DRESSINGS) ×1 IMPLANT
MESH VENTRALEX ST 1-7/10 CRC S (Mesh General) ×1 IMPLANT
NDL HYPO 25X1 1.5 SAFETY (NEEDLE) ×1 IMPLANT
NDL SPNL 22GX1.5 QUINCKE BK (NEEDLE) ×1 IMPLANT
NEEDLE HYPO 25X1 1.5 SAFETY (NEEDLE) ×2 IMPLANT
NEEDLE SPNL 22GX1.5 QUINCKE BK (NEEDLE) ×2 IMPLANT
PACK BASIN MINOR ARMC (MISCELLANEOUS) ×2 IMPLANT
PAD DRESSING TELFA 3X8 NADH (GAUZE/BANDAGES/DRESSINGS) ×1 IMPLANT
SPONGE LAP 18X18 5 PK (GAUZE/BANDAGES/DRESSINGS) ×1 IMPLANT
STAPLER SKIN PROX 35W (STAPLE) ×2 IMPLANT
SUT ETHIBOND 0 MO6 C/R (SUTURE) ×2 IMPLANT
SUT ETHIBOND NAB MO 7 #0 18IN (SUTURE) ×2 IMPLANT
SUT MNCRL AB 4-0 PS2 18 (SUTURE) ×1 IMPLANT
SUT VIC AB 2-0 SH 27 (SUTURE) ×4
SUT VIC AB 2-0 SH 27XBRD (SUTURE) ×2 IMPLANT
SYR 20CC LL (SYRINGE) ×2 IMPLANT
TAPE MICROFOAM 4IN (TAPE) ×2 IMPLANT
WATER STERILE IRR 1000ML POUR (IV SOLUTION) ×2 IMPLANT

## 2015-04-18 NOTE — Discharge Instructions (Signed)

## 2015-04-18 NOTE — Progress Notes (Signed)
Dr Rosey Bath aware re pt's advising dry cough x 2 days "like it's tickling in the back of my throat"  Also aware re temp.

## 2015-04-18 NOTE — Anesthesia Procedure Notes (Signed)
Procedure Name: Intubation Date/Time: 04/18/2015 5:25 PM Performed by: Dionne Bucy Pre-anesthesia Checklist: Patient identified, Patient being monitored, Timeout performed, Emergency Drugs available and Suction available Patient Re-evaluated:Patient Re-evaluated prior to inductionOxygen Delivery Method: Circle system utilized Preoxygenation: Pre-oxygenation with 100% oxygen Intubation Type: IV induction Ventilation: Mask ventilation without difficulty Laryngoscope Size: Mac and 3 Grade View: Grade I Tube type: Oral Tube size: 7.0 mm Number of attempts: 1 Airway Equipment and Method: Stylet Placement Confirmation: ETT inserted through vocal cords under direct vision,  positive ETCO2 and breath sounds checked- equal and bilateral Secured at: 21 cm Tube secured with: Tape Dental Injury: Teeth and Oropharynx as per pre-operative assessment

## 2015-04-18 NOTE — Interval H&P Note (Signed)
History and Physical Interval Note:  04/18/2015 5:05 PM  Nimrit L Gantt  has presented today for surgery, with the diagnosis of epigastric hernia  The various methods of treatment have been discussed with the patient and family. After consideration of risks, benefits and other options for treatment, the patient has consented to  Procedure(s): HERNIA REPAIR EPIGASTRIC ADULT (N/A) as a surgical intervention .  The patient's history has been reviewed, patient examined, no change in status, stable for surgery.  I have reviewed the patient's chart and labs.  Questions were answered to the patient's satisfaction.     [place]

## 2015-04-18 NOTE — Op Note (Addendum)
  Pre-operative Diagnosis: Epigastric Hernia  Post-operative Diagnosis: Same  Surgeon: Marjory Lies Mallorie Norrod   Anesthesia: General endotracheal anesthesia  ASA Class: 1  Surgeon: [name] , MD FACS  Anesthesia: Gen. with endotracheal tube  Procedure: Repair of Epigastric hernia with small ventralex patch  Findings: Epigastric hernia with incarcerated preperitoneal fat, defect 1.5 cms  Estimated Blood Loss: 5 cc         Drains: None         Specimens: Hernia sac       Complications: none         Condition: stable  Procedure Details  The patient was seen again in the Holding Room. The benefits, complications, treatment options, and expected outcomes were discussed with the patient. The risks of bleeding, infection, recurrence of symptoms, failure to resolve symptoms,  bowel injury, any of which could require further surgery were reviewed with the patient.   The patient was taken to Operating Room, identified as Cassidy Mathis and the procedure verified.  A Time Out was held and the above information confirmed.  Prior to the induction of general anesthesia, antibiotic prophylaxis was administered. VTE prophylaxis was in place. General endotracheal anesthesia was then administered and tolerated well. After the induction, the abdomen was prepped with Chloraprep and draped in the sterile fashion. The patient was positioned in the supine position.  A supraumbilical incision was created over the hernia sac and electrocautery was used to dissect through subcutaneous tissue. The hernia was dissected free from adjacent tissue and fascia. The hernia sac was entered and the sac was excised, preperitoneal fat was incarcerated. Care was taken to avoid any injury to the bowel. The ventralex mesh Small size was placed and attached to the fascia using interrupted 0 Ethibond sutures in the standard fashion. The cutaneous tissue was closed with 2-0 Vicryl and skin was closed with a 4-0 Monocryl in a  subcuticular fashion. Dermabond was used to coat the skin. Lidocaine 1% with epinephrine and Marcaine quarter percent was used to inject all the incision sites. Patient tolerated procedure well and there were no immediate complications. Needle and laparotomy counts were correct  [name], MD, FACS

## 2015-04-18 NOTE — Anesthesia Preprocedure Evaluation (Signed)
Anesthesia Evaluation  Patient identified by MRN, date of birth, ID band Patient awake    Reviewed: Allergy & Precautions, H&P , NPO status , Patient's Chart, lab work & pertinent test results, reviewed documented beta blocker date and time   History of Anesthesia Complications Negative for: history of anesthetic complications  Airway Mallampati: II  TM Distance: >3 FB Neck ROM: full    Dental no notable dental hx. (+) Teeth Intact   Pulmonary neg shortness of breath, neg sleep apnea, neg COPD, neg recent URI, Current Smoker,    Pulmonary exam normal breath sounds clear to auscultation       Cardiovascular Exercise Tolerance: Good hypertension, On Medications (-) angina(-) CAD, (-) Past MI, (-) Cardiac Stents and (-) CABG Normal cardiovascular exam(-) dysrhythmias (-) Valvular Problems/Murmurs Rhythm:regular Rate:Normal     Neuro/Psych PSYCHIATRIC DISORDERS (Depression) negative neurological ROS     GI/Hepatic Neg liver ROS, GERD  ,  Endo/Other  negative endocrine ROS  Renal/GU Renal disease (kidney stones)  negative genitourinary   Musculoskeletal   Abdominal   Peds  Hematology  (+) Blood dyscrasia, anemia ,   Anesthesia Other Findings Past Medical History:   Hypertension                                                 Depression                                                   Chronic kidney disease                                         Comment:KIDNEY STONES CURRENTLY   Headache                                                     Anemia                                                       GERD (gastroesophageal reflux disease)                         Comment:PT TAKES PPI PRN BUT DOES NOT KNOW THE               NAME-VERY RARE USE   Reproductive/Obstetrics negative OB ROS                             Anesthesia Physical Anesthesia Plan  ASA: II  Anesthesia Plan: General    Post-op Pain Management:    Induction:   Airway Management Planned:   Additional Equipment:   Intra-op Plan:   Post-operative Plan:   Informed Consent: I have reviewed the patients History and Physical, chart, labs and discussed  the procedure including the risks, benefits and alternatives for the proposed anesthesia with the patient or authorized representative who has indicated his/her understanding and acceptance.   Dental Advisory Given  Plan Discussed with: Anesthesiologist, CRNA and Surgeon  Anesthesia Plan Comments:         Anesthesia Quick Evaluation

## 2015-04-18 NOTE — H&P (View-Only) (Signed)
Patient ID: Cassidy Mathis, female   DOB: October 07, 1975, 40 y.o.   MRN: [id number]  History of Present Illness Cassidy Mathis is a 40 y.o. female with acute onset of abdominal pain for last couple weeks. She recently went to the emergency room where workup revealed some evidence of gallstones. She describes that her pain is actually on top of her umbilicus, is sharp and constant and is moderate to severe in intensity. And there is increasing pain when she moves around and there is no specific alleviating factors. She is able to perform more than 4 Mets of activity without shortness of breath or chest pain. She denies any previous abdominal surgeries. She denies any evidence of biliary obstruction or gallbladder related symptoms.  Past Medical History Past Medical History  Diagnosis Date  . Hypertension      Past Surgical History  Procedure Laterality Date  . Cyst excision Left 1998    wrist  . Cyst excision  1998    tongue    No Known Allergies  Current Outpatient Prescriptions  Medication Sig Dispense Refill  . lisinopril-hydrochlorothiazide (PRINZIDE,ZESTORETIC) 20-25 MG tablet Take 1 tablet by mouth 1 day or 1 dose.  0   No current facility-administered medications for this visit.    Family History Family History  Problem Relation Age of Onset  . Hypertension Mother   . Kidney Stones Mother   . Gallstones Mother   . Hypertension Father   . Gout Father   . Cancer Paternal Uncle     lung  . Cancer Maternal Grandmother     not sure what type of cancer  . Cancer Maternal Grandfather     not sure what type of cancer  . Cancer Paternal Grandmother     not sure what type of cancer  . Cancer Paternal Grandfather     not sure what type of cancer     Social History Social History  Substance Use Topics  . Smoking status: Current Every Day Smoker  . Smokeless tobacco: Never Used  . Alcohol Use: 0.0 oz/week    0 Standard drinks or equivalent per week     Comment: Barely       ROS 10 point  review of system was performed otherwise negative other than what is stated in the history of present illness   Physical Exam Blood pressure 146/98, pulse 70, temperature 98.4 F (36.9 C), temperature source Oral, height 5\' 5"  (1.651 m), weight 85.276 kg (188 lb), last menstrual period 04/15/2015.  CONSTITUTIONAL: No acute distress awake alert well-developed EYES: Pupils equal, round, and reactive to light, Sclera non-icteric. EARS, NOSE, MOUTH AND THROAT: The oropharynx is clear. Oral mucosa is pink and moist. Hearing is intact to voice.  NECK: Trachea is midline, and there is no jugular venous distension. Thyroid is without palpable abnormalities. LYMPH NODES:  Lymph nodes in the neck are not enlarged. RESPIRATORY:  Lungs are clear, and breath sounds are equal bilaterally. Normal respiratory effort without pathologic use of accessory muscles. CARDIOVASCULAR: Heart is regular without murmurs, gallops, or rubs. GI: The abdomen is  soft, tender incarcerated Epigastric hernia , non distended, no peritonitis. There was no hepatosplenomegaly. There were normal bowel sounds. MUSCULOSKELETAL:  Normal muscle strength and tone in all four extremities.    SKIN: Skin turgor is normal. There are no pathologic skin lesions.  NEUROLOGIC:  Motor and sensation is grossly normal.  Cranial nerves are grossly intact. PSYCH:  Alert and oriented to person, place and time.  Affect is normal.  Data Reviewed  I have personally reviewed the patient's imaging and medical records.    Assessment/Plan Incarcerated epigastric hernia likely omentum or preperitoneal fat given her clinical presentation. Oh evidence of bowel compromise. She has been hurting for a couple weeks and had a normal white count. Now she does have a history of cholelithiasis but these are asymptomatic. Her current symptoms are definitely related to the epigastric hernia and not to her gallbladder. Had a lengthy discussion about  the patient about options and one of the doing simultaneous laparoscopic cholecystectomy and hernia repair versus doing only the hernia repair. I did explain to her that this symptomatic issue is the epigastric hernia and actually recommended to have this performed only so we can safely placed mesh. She and she understands that in the future her gallstones may become symptomatic at that time we'll we'll address that with a cholecystectomy. At this point I only thing that she needs the epigastric hernia repair. Since it counseling provided. I have explained the procedure, risks, and aftercare of  hernia repair to [place].   Risks include but are not limited to bleeding, infection, wound problems, anesthesia, recurrence, bladder or intestine injury, urinary retention, chronic pain, mesh problems.  She  seems to understand and agrees to proceed.  Questions were answered to her stated satisfaction.   [name], MD [place] 04/15/2015, 3:17 PM

## 2015-04-18 NOTE — Progress Notes (Signed)
Dr. Rosey Bath in to see pt, aware re V/S, and UDS - advises ok to proceed

## 2015-04-18 NOTE — Transfer of Care (Signed)
Immediate Anesthesia Transfer of Care Note  Patient: Cassidy Mathis  Procedure(s) Performed: Procedure(s): HERNIA REPAIR EPIGASTRIC ADULT (N/A)  Patient Location: PACU  Anesthesia Type:General  Level of Consciousness: awake  Airway & Oxygen Therapy: Patient Spontanous Breathing and Patient connected to face mask oxygen  Post-op Assessment: Report given to RN and Post -op Vital signs reviewed and stable  Post vital signs: stable  Last Vitals:  Filed Vitals:   04/18/15 1815 04/18/15 1816  BP: 139/88 139/88  Pulse: 104 103  Temp: 36.8 C   Resp: 18 18    Complications: No apparent anesthesia complications

## 2015-04-19 NOTE — Anesthesia Postprocedure Evaluation (Signed)
Anesthesia Post Note  Patient: Cassidy Mathis  Procedure(s) Performed: Procedure(s) (LRB): HERNIA REPAIR EPIGASTRIC ADULT (N/A)  Patient location during evaluation: PACU Anesthesia Type: General Level of consciousness: awake and alert Pain management: pain level controlled Vital Signs Assessment: post-procedure vital signs reviewed and stable Respiratory status: spontaneous breathing, nonlabored ventilation, respiratory function stable and patient connected to nasal cannula oxygen Cardiovascular status: blood pressure returned to baseline and stable Postop Assessment: no signs of nausea or vomiting Anesthetic complications: no    Last Vitals:  Filed Vitals:   04/18/15 1915 04/18/15 1925  BP: 145/87 148/87  Pulse: 83 88  Temp:  36.7 C  Resp: 17     Last Pain:  Filed Vitals:   04/18/15 1940  PainSc: 2                  Martha Clan

## 2015-04-21 ENCOUNTER — Encounter: Payer: Self-pay | Admitting: Surgery

## 2015-04-22 ENCOUNTER — Telehealth: Payer: Self-pay

## 2015-04-22 ENCOUNTER — Telehealth: Payer: Self-pay | Admitting: Surgery

## 2015-04-22 LAB — SURGICAL PATHOLOGY

## 2015-04-22 NOTE — Telephone Encounter (Signed)
Patient's disability forms [employer]) was filled out and faxed.

## 2015-04-22 NOTE — Telephone Encounter (Signed)
Patient has paid her $25.00 disability paperwork fee.

## 2015-04-30 ENCOUNTER — Telehealth: Payer: Self-pay | Admitting: Surgery

## 2015-04-30 NOTE — Telephone Encounter (Signed)
Patient called and stated that since her surgery on 3/21 she has a knot under her incision that moves and it is sore. Her post op appointment is next week. Please call.

## 2015-05-01 NOTE — Telephone Encounter (Signed)
Denies drainage, redness, fever, and nausea/vomiting. She just states that she is having pain at the incision site and it feels like a knot under the skin. I reassured her that it is sutures that are causing this and it will become better as times goes on. I explained that she may use Ice and Ibuprofen PRN. Patient verbalizes understanding.

## 2015-05-07 ENCOUNTER — Encounter: Payer: Self-pay | Admitting: Surgery

## 2015-05-07 ENCOUNTER — Ambulatory Visit (INDEPENDENT_AMBULATORY_CARE_PROVIDER_SITE_OTHER): Payer: Medicaid Other | Admitting: Surgery

## 2015-05-07 VITALS — BP 144/98 | HR 68 | Temp 98.2°F | Ht 65.5 in | Wt 187.0 lb

## 2015-05-07 DIAGNOSIS — Z09 Encounter for follow-up examination after completed treatment for conditions other than malignant neoplasm: Secondary | ICD-10-CM

## 2015-05-07 MED ORDER — OXYCODONE-ACETAMINOPHEN 5-325 MG PO TABS: 1.0000 | ORAL_TABLET | ORAL | Status: DC | PRN

## 2015-05-07 NOTE — Addendum Note (Signed)
Addended by: Wayna Chalet on: 05/07/2015 11:15 AM   Modules accepted: Orders

## 2015-05-07 NOTE — Progress Notes (Signed)
S/p open epigastric hernia repair, doing well, with some intermittent pain. + PO, + BM, no fevers or chills  PE NAD Abd: soft, mild TTP , wound healing well, no recurrence  A/P doing well No heavy lifting Refill percocet x 1 only d/w her about transitioning to NSAIDS and applying ice RTC prn

## 2015-05-13 ENCOUNTER — Telehealth: Payer: Self-pay

## 2015-05-13 NOTE — Telephone Encounter (Signed)
Patient's Return to work certificate from [employer] was filled out and faxed.

## 2015-05-29 ENCOUNTER — Telehealth: Payer: Self-pay

## 2015-05-29 NOTE — Telephone Encounter (Signed)
Patient called in and needs a work note to go back to work on 05/30/15 without restrictions.  Note completed. Patient will pick up in [place] office.

## 2015-06-10 ENCOUNTER — Telehealth: Payer: Self-pay | Admitting: Surgery

## 2015-06-10 NOTE — Telephone Encounter (Signed)
Returned phone call to patient. Patient states that she has a knot under her incision that is approximately the size of her index finger pad. I explained that she does have a knot of absorbable suture under this incision and it could take up to 6 months for this to dissolve. Patient also asked if we could fill out intermittent FMLA forms for her work. I explained that as of 05/30/15, she has been released to return to work without restrictions and she would not need to have intermittent FMLA. Patient verbalizes understanding.

## 2015-06-10 NOTE — Telephone Encounter (Signed)
Please call patient. She had hernia repair epigastric surgery with Dr Dahlia Byes on 04/18/15. Patient states she has a knot under her incision and is concerned it may be another hernia. Please call her after 1pm today because she will be off work at [employer]. Thanks.

## 2015-09-27 ENCOUNTER — Encounter: Payer: Self-pay | Admitting: Emergency Medicine

## 2015-09-27 ENCOUNTER — Emergency Department
Admission: EM | Admit: 2015-09-27 | Discharge: 2015-09-27 | Disposition: A | Discharge status: Home / Self Care | Payer: Medicaid Other | Attending: Emergency Medicine | Admitting: Emergency Medicine

## 2015-09-27 DIAGNOSIS — L0231 Cutaneous abscess of buttock: Secondary | ICD-10-CM | POA: Insufficient documentation

## 2015-09-27 DIAGNOSIS — I129 Hypertensive chronic kidney disease with stage 1 through stage 4 chronic kidney disease, or unspecified chronic kidney disease: Secondary | ICD-10-CM | POA: Diagnosis not present

## 2015-09-27 DIAGNOSIS — Z79899 Other long term (current) drug therapy: Secondary | ICD-10-CM | POA: Diagnosis not present

## 2015-09-27 DIAGNOSIS — F17210 Nicotine dependence, cigarettes, uncomplicated: Secondary | ICD-10-CM | POA: Diagnosis not present

## 2015-09-27 DIAGNOSIS — N189 Chronic kidney disease, unspecified: Secondary | ICD-10-CM | POA: Diagnosis not present

## 2015-09-27 LAB — POCT PREGNANCY, URINE: PREG TEST UR: NEGATIVE

## 2015-09-27 MED ORDER — OXYCODONE-ACETAMINOPHEN 5-325 MG PO TABS
1.0000 | ORAL_TABLET | Freq: Once | ORAL | Status: AC
  Administered 2015-09-27: 1 via ORAL
  Filled 2015-09-27: qty 1

## 2015-09-27 MED ORDER — OXYCODONE-ACETAMINOPHEN 5-325 MG PO TABS: 1.0000 | ORAL_TABLET | ORAL | 0 refills | Status: DC | PRN

## 2015-09-27 MED ORDER — CLINDAMYCIN HCL 150 MG PO CAPS: 300.0000 mg | ORAL_CAPSULE | Freq: Three times a day (TID) | ORAL | 0 refills | Status: DC

## 2015-09-27 NOTE — Discharge Instructions (Signed)
Begin taking medication as directed. Clindamycin is for infection. Percocet is for pain. Warm compresses or sitz baths frequently. Call to make an appointment with the surgeon on call Dr. Jamal Collin. Return to the emergency room if any severe worsening of your symptoms.

## 2015-09-27 NOTE — ED Provider Notes (Signed)
[facility] Emergency Department Provider Note   ____________________________________________   First MD Initiated Contact with Patient 09/27/15 1408     (approximate)  I have reviewed the triage vital signs and the nursing notes.   HISTORY  Chief Complaint Abscess   HPI Cassidy Mathis is a 40 y.o. female is here with complaint of "boil on  butt" for 3 days. Patient states she has been using sitz baths as often as possible. She denies any fever or chills, she states that there is some nausea from the pain. Patient states that she has had an abscess in the same area prior to this event. Pain is increased with sitting or walking. Patient states that nothing is helped with her pain. Currently she rates her pain as a 10 over 10.   Past Medical History:  Diagnosis Date  . Allergy    seasonal  . Anemia   . Chronic kidney disease    KIDNEY STONES CURRENTLY  . Depression   . GERD (gastroesophageal reflux disease)    PT TAKES PPI PRN BUT DOES NOT KNOW THE NAME-VERY RARE USE  . Headache   . Hypertension     Patient Active Problem List   Diagnosis Date Noted  . Incarcerated epigastric hernia     Past Surgical History:  Procedure Laterality Date  . CYST EXCISION Left 1998   wrist  . CYST EXCISION  1998   tongue  . EPIGASTRIC HERNIA REPAIR N/A 04/18/2015   Procedure: HERNIA REPAIR EPIGASTRIC ADULT;  Surgeon: Jules Husbands, MD;  Location: [facility] ORS;  Service: General;  Laterality: N/A;  . TUBAL LIGATION      Prior to Admission medications   Medication Sig Start Date End Date Taking? Authorizing Provider  clindamycin (CLEOCIN) 150 MG capsule Take 2 capsules (300 mg total) by mouth 3 (three) times daily. 09/27/15   Johnn Hai, PA-C  lisinopril-hydrochlorothiazide (PRINZIDE,ZESTORETIC) 20-25 MG tablet Take 1 tablet by mouth 1 day or 1 dose. 01/10/15   Historical Provider, MD  oxyCODONE-acetaminophen (PERCOCET) 5-325 MG tablet Take 1 tablet by mouth  every 4 (four) hours as needed for severe pain. 09/27/15   Johnn Hai, PA-C    Allergies Review of patient's allergies indicates no known allergies.  Family History  Problem Relation Age of Onset  . Hypertension Mother   . Kidney Stones Mother   . Gallstones Mother   . Hypertension Father   . Gout Father   . Cancer Paternal Uncle     lung  . Cancer Maternal Grandmother     not sure what type of cancer  . Cancer Maternal Grandfather     not sure what type of cancer  . Cancer Paternal Grandmother     not sure what type of cancer  . Cancer Paternal Grandfather     not sure what type of cancer    Social History Social History  Substance Use Topics  . Smoking status: Current Every Day Smoker    Packs/day: 0.50    Years: 12.00    Types: Cigarettes  . Smokeless tobacco: Never Used  . Alcohol use 0.0 oz/week     Comment: Barely    Review of Systems Constitutional: No fever/chills Cardiovascular: Denies chest pain. Respiratory: Denies shortness of breath. Gastrointestinal:   No nausea, no vomiting.   Musculoskeletal: Negative for back pain. Skin:Positive for abscess. Neurological: Negative for headaches, focal weakness or numbness.  10-point ROS otherwise negative.  ____________________________________________   PHYSICAL EXAM:  VITAL SIGNS: ED Triage Vitals [09/27/15 1352]  Enc Vitals Group     BP (!) 144/100     Pulse Rate 100     Resp 20     Temp 99 F (37.2 C)     Temp Source Oral     SpO2 100 %     Weight 180 lb (81.6 kg)     Height 5\' 5"  (1.651 m)     Head Circumference      Peak Flow      Pain Score 10     Pain Loc      Pain Edu?      Excl. in [place]?     Constitutional: Alert and oriented. Well appearing and in no acute distress. Eyes: Conjunctivae are normal. PERRL. EOMI. Head: Atraumatic. Nose: No congestion/rhinnorhea. Neck: No stridor.   Cardiovascular: Normal rate, regular rhythm. Grossly normal heart sounds.  Good peripheral  circulation. Respiratory: Normal respiratory effort.  No retractions. Lungs CTAB. Gastrointestinal: Soft and nontender. No distention.  Musculoskeletal: Moves upper and lower extremities without any difficulty. Normal gait was noted. Neurologic:  Normal speech and language. No gross focal neurologic deficits are appreciated. No gait instability. Skin:  Skin is warm, dry. On examination of the buttocks there is erythema bilateral upper buttocks area without evidence of upon arrival cyst. There is warm to touch. There is no fluctuant area. Moderate tenderness. No drainage is present. Psychiatric: Mood and affect are normal. Speech and behavior are normal.  ____________________________________________   LABS (all labs ordered are listed, but only abnormal results are displayed)  Labs Reviewed  POC URINE PREG, ED  POCT PREGNANCY, URINE    PROCEDURES  Procedure(s) performed: None  Procedures  Critical Care performed: No  ____________________________________________   INITIAL IMPRESSION / ASSESSMENT AND PLAN / ED COURSE  Pertinent labs & imaging results that were available during my care of the patient were reviewed by me and considered in my medical decision making (see chart for details).    Clinical Course  Patient was given a prescription for Percocet as needed for pain. Patient is also given a prescription for clindamycin to be taken for the next 7 days. Patient is return to the emergency room over the weekend if any severe worsening of her symptoms, elevated temp for nausea or vomiting. She is also instructed to follow-up with Dr. Jamal Collin for an appointment or see her primary care doctor for referral. ___________________________________________   FINAL CLINICAL IMPRESSION(S) / ED DIAGNOSES  Final diagnoses:  Abscess of buttock, right  Abscess of left buttock      NEW MEDICATIONS STARTED DURING THIS VISIT:  New Prescriptions   CLINDAMYCIN (CLEOCIN) 150 MG CAPSULE     Take 2 capsules (300 mg total) by mouth 3 (three) times daily.   OXYCODONE-ACETAMINOPHEN (PERCOCET) 5-325 MG TABLET    Take 1 tablet by mouth every 4 (four) hours as needed for severe pain.     Note:  This document was prepared using Dragon voice recognition software and may include unintentional dictation errors.    Johnn Hai, PA-C 09/27/15 [place], MD 09/27/15 1535

## 2015-09-27 NOTE — ED Triage Notes (Signed)
Reports a "boil on her butt"

## 2015-09-27 NOTE — ED Notes (Signed)
See triage note, pt c/o "boil on butt" x 3 days.  Painful on palpation, no redness noted to area.

## 2015-09-30 ENCOUNTER — Encounter: Payer: Self-pay | Admitting: *Deleted

## 2015-10-01 ENCOUNTER — Ambulatory Visit: Payer: Self-pay | Admitting: General Surgery

## 2015-10-04 ENCOUNTER — Emergency Department
Admission: EM | Admit: 2015-10-04 | Discharge: 2015-10-04 | Disposition: A | Discharge status: Home / Self Care | Payer: Medicaid Other | Attending: Emergency Medicine | Admitting: Emergency Medicine

## 2015-10-04 ENCOUNTER — Emergency Department: Payer: Medicaid Other

## 2015-10-04 ENCOUNTER — Encounter: Payer: Self-pay | Admitting: Emergency Medicine

## 2015-10-04 DIAGNOSIS — N189 Chronic kidney disease, unspecified: Secondary | ICD-10-CM | POA: Diagnosis not present

## 2015-10-04 DIAGNOSIS — R1013 Epigastric pain: Secondary | ICD-10-CM | POA: Diagnosis present

## 2015-10-04 DIAGNOSIS — K819 Cholecystitis, unspecified: Secondary | ICD-10-CM | POA: Diagnosis not present

## 2015-10-04 DIAGNOSIS — F17210 Nicotine dependence, cigarettes, uncomplicated: Secondary | ICD-10-CM | POA: Diagnosis not present

## 2015-10-04 DIAGNOSIS — I129 Hypertensive chronic kidney disease with stage 1 through stage 4 chronic kidney disease, or unspecified chronic kidney disease: Secondary | ICD-10-CM | POA: Diagnosis not present

## 2015-10-04 DIAGNOSIS — Z79899 Other long term (current) drug therapy: Secondary | ICD-10-CM | POA: Diagnosis not present

## 2015-10-04 DIAGNOSIS — R109 Unspecified abdominal pain: Secondary | ICD-10-CM

## 2015-10-04 LAB — BASIC METABOLIC PANEL
Anion gap: 7 (ref 5–15)
BUN: 8 mg/dL (ref 6–20)
CO2: 26 mmol/L (ref 22–32)
CREATININE: 0.87 mg/dL (ref 0.44–1.00)
Calcium: 9 mg/dL (ref 8.9–10.3)
Chloride: 105 mmol/L (ref 101–111)
GFR calc Af Amer: >60 mL/min (ref >60)
GLUCOSE: 95 mg/dL (ref 65–99)
POTASSIUM: 3.8 mmol/L (ref 3.5–5.1)
SODIUM: 138 mmol/L (ref 135–145)

## 2015-10-04 LAB — HEPATIC FUNCTION PANEL
ALT: 179 U/L — ABNORMAL HIGH (ref 14–54)
AST: 73 U/L — AB (ref 15–41)
Albumin: 3.8 g/dL (ref 3.5–5.0)
Alkaline Phosphatase: 96 U/L (ref 38–126)
BILIRUBIN TOTAL: 0.5 mg/dL (ref 0.3–1.2)
Total Protein: 7.6 g/dL (ref 6.5–8.1)

## 2015-10-04 LAB — CBC
HEMATOCRIT: 38.6 % (ref 35.0–47.0)
Hemoglobin: 13.4 g/dL (ref 12.0–16.0)
MCH: 30.3 pg (ref 26.0–34.0)
MCHC: 34.7 g/dL (ref 32.0–36.0)
MCV: 87.2 fL (ref 80.0–100.0)
PLATELETS: 462 10*3/uL — AB (ref 150–440)
RBC: 4.43 MIL/uL (ref 3.80–5.20)
RDW: 13.6 % (ref 11.5–14.5)
WBC: 7.7 10*3/uL (ref 3.6–11.0)

## 2015-10-04 LAB — LIPASE, BLOOD: LIPASE: 26 U/L (ref 11–51)

## 2015-10-04 LAB — TROPONIN I: Troponin I: <0.03 ng/mL (ref <0.03)

## 2015-10-04 MED ORDER — ONDANSETRON HCL 4 MG PO TABS: 4.0000 mg | ORAL_TABLET | Freq: Three times a day (TID) | ORAL | 0 refills | Status: DC | PRN

## 2015-10-04 MED ORDER — OXYCODONE-ACETAMINOPHEN 5-325 MG PO TABS: 1.0000 | ORAL_TABLET | ORAL | 0 refills | Status: DC | PRN

## 2015-10-04 MED ORDER — ONDANSETRON HCL 4 MG/2ML IJ SOLN
4.0000 mg | Freq: Once | INTRAMUSCULAR | Status: AC
  Administered 2015-10-04: 4 mg via INTRAVENOUS
  Filled 2015-10-04: qty 2

## 2015-10-04 MED ORDER — HYDROMORPHONE HCL 1 MG/ML IJ SOLN
1.0000 mg | Freq: Once | INTRAMUSCULAR | Status: AC
  Administered 2015-10-04: 1 mg via INTRAVENOUS
  Filled 2015-10-04: qty 1

## 2015-10-04 MED ORDER — SODIUM CHLORIDE 0.9 % IV BOLUS (SEPSIS)
1000.0000 mL | Freq: Once | INTRAVENOUS | Status: AC
  Administered 2015-10-04: 1000 mL via INTRAVENOUS

## 2015-10-04 NOTE — ED Notes (Signed)
Pt resting in bed, resp even and unlabored, pt talking on phone in bed

## 2015-10-04 NOTE — ED Notes (Signed)
Pt given apple juice for PO challenge.

## 2015-10-04 NOTE — ED Triage Notes (Signed)
Pt states she has been out of bp meds for over a week.

## 2015-10-04 NOTE — ED Triage Notes (Signed)
Pt to ed with c/o chest pain and sob that started last night, pt states squeezing and pressure in center of chest.  Pt tearful during triage.

## 2015-10-04 NOTE — ED Notes (Signed)
Patient states that she has been having chest pressure since last night. Patient states that she feels like someone has their knee on her chest, pressure radiates through to the back. Patient also c/o vomiting 5 times since 4 am. Patient is also having shortness of breath.

## 2015-10-04 NOTE — Progress Notes (Signed)
Outpatient Surgical Follow Up  10/04/2015  Cassidy Mathis is an 40 y.o. female.   Chief Complaint  Patient presents with  . Chest Pain    HPI: 40 year old female who is well known to the surgery service ports to the ER for evaluation of chest pain. She reports that for the last week she's had subjective fevers and chills. 3 days ago she started having nausea and vomiting and then today she had abdominal pain with worsening chest pain and nausea and vomiting prompting her to come to the emergency department. Currently she reports she feels much better. She received some pain medication and nausea medication with near resolution of all of her symptoms. She is known for some time about gallstones and surgery was consult today for evaluation of possible gallbladder disease.  Past Medical History:  Diagnosis Date  . Allergy    seasonal  . Anemia   . Chronic kidney disease    KIDNEY STONES CURRENTLY  . Depression   . GERD (gastroesophageal reflux disease)    PT TAKES PPI PRN BUT DOES NOT KNOW THE NAME-VERY RARE USE  . Headache   . Hypertension     Past Surgical History:  Procedure Laterality Date  . CYST EXCISION Left 1998   wrist  . CYST EXCISION  1998   tongue  . EPIGASTRIC HERNIA REPAIR N/A 04/18/2015   Procedure: HERNIA REPAIR EPIGASTRIC ADULT;  Surgeon: Jules Husbands, MD;  Location: [facility] ORS;  Service: General;  Laterality: N/A;  . TUBAL LIGATION      Family History  Problem Relation Age of Onset  . Hypertension Mother   . Kidney Stones Mother   . Gallstones Mother   . Hypertension Father   . Gout Father   . Cancer Paternal Uncle     lung  . Cancer Maternal Grandmother     not sure what type of cancer  . Cancer Maternal Grandfather     not sure what type of cancer  . Cancer Paternal Grandmother     not sure what type of cancer  . Cancer Paternal Grandfather     not sure what type of cancer    Social History:  reports that she has been smoking Cigarettes.  She  has a 6.00 pack-year smoking history. She has never used smokeless tobacco. She reports that she drinks alcohol. She reports that she uses drugs, including Marijuana.  Allergies: No Known Allergies  Medications reviewed.    ROS A multipoint review of systems was completed, all pertinent positives and negatives are documented in the history of present illness remainder are negative.   BP (!) 140/92 (BP Location: Right Arm) Comment: Simultaneous filing. User may not have seen previous data.  Pulse (!) 56 Comment: Simultaneous filing. User may not have seen previous data.  Temp 98.6 F (37 C) (Oral)   Resp 13 Comment: Simultaneous filing. User may not have seen previous data.  Ht '5\' 5"'$  (1.651 m)   Wt 81.6 kg (180 lb)   LMP 10/04/2015 (Exact Date)   SpO2 99% Comment: Simultaneous filing. User may not have seen previous data.  BMI 29.95 kg/m   Physical Exam Gen.: Resting bed in no acute distress Neck: Supple and nontender Chest: Clear to auscultation Heart: Regular rate and rhythm Abdomen: Soft, minimal tenderness to deep palpation in the right upper quadrant, nondistended. Well-healed midline incision from hernia repair Extremities: No evidence of pedal edema or cyanosis    Results for orders placed or performed during the  hospital encounter of 10/04/15 (from the past 48 hour(s))  Basic metabolic panel     Status: None   Collection Time: 10/04/15  9:31 AM  Result Value Ref Range   Sodium 138 135 - 145 mmol/L   Potassium 3.8 3.5 - 5.1 mmol/L   Chloride 105 101 - 111 mmol/L   CO2 26 22 - 32 mmol/L   Glucose, Bld 95 65 - 99 mg/dL   BUN 8 6 - 20 mg/dL   Creatinine, Ser 0.87 0.44 - 1.00 mg/dL   Calcium 9.0 8.9 - 10.3 mg/dL   GFR calc non Af Amer >60 >60 mL/min   GFR calc Af Amer >60 >60 mL/min    Comment: (NOTE) The eGFR has been calculated using the CKD EPI equation. This calculation has not been validated in all clinical situations. eGFR's persistently <60 mL/min signify  possible Chronic Kidney Disease.    Anion gap 7 5 - 15  CBC     Status: Abnormal   Collection Time: 10/04/15  9:31 AM  Result Value Ref Range   WBC 7.7 3.6 - 11.0 K/uL   RBC 4.43 3.80 - 5.20 MIL/uL   Hemoglobin 13.4 12.0 - 16.0 g/dL   HCT 38.6 35.0 - 47.0 %   MCV 87.2 80.0 - 100.0 fL   MCH 30.3 26.0 - 34.0 pg   MCHC 34.7 32.0 - 36.0 g/dL   RDW 13.6 11.5 - 14.5 %   Platelets 462 (H) 150 - 440 K/uL  Troponin I     Status: None   Collection Time: 10/04/15  9:31 AM  Result Value Ref Range   Troponin I <0.03 <0.03 ng/mL  Hepatic function panel     Status: Abnormal   Collection Time: 10/04/15  9:31 AM  Result Value Ref Range   Total Protein 7.6 6.5 - 8.1 g/dL   Albumin 3.8 3.5 - 5.0 g/dL   AST 73 (H) 15 - 41 U/L   ALT 179 (H) 14 - 54 U/L   Alkaline Phosphatase 96 38 - 126 U/L   Total Bilirubin 0.5 0.3 - 1.2 mg/dL   Bilirubin, Direct <0.1 (L) 0.1 - 0.5 mg/dL   Indirect Bilirubin NOT CALCULATED 0.3 - 0.9 mg/dL  Lipase, blood     Status: None   Collection Time: 10/04/15  9:31 AM  Result Value Ref Range   Lipase 26 11 - 51 U/L   Dg Chest 2 View  Result Date: 10/04/2015 CLINICAL DATA:  Patient with chest pain for one night. Initial encounter. EXAM: CHEST  2 VIEW COMPARISON:  Chest radiograph 02/03/2012. FINDINGS: The heart size and mediastinal contours are within normal limits. Both lungs are clear. The visualized skeletal structures are unremarkable. IMPRESSION: No active cardiopulmonary disease. Electronically Signed   By: Lovey Newcomer M.D.   On: 10/04/2015 10:06   US Abdomen Limited Ruq  Result Date: 10/04/2015 CLINICAL DATA:  Abdominal pain since last evening. Nausea and vomiting. History of gallstones. EXAM: US ABDOMEN LIMITED - RIGHT UPPER QUADRANT COMPARISON:  Right upper quadrant abdominal ultrasound - 04/13/2015 FINDINGS: Gallbladder: Note is made of a punctate (approximately 0.4 cm) echogenic stone within the neck of the gallbladder (representative images 6 and 7). Multiple  additional echogenic stones are seen within the remainder of the gallbladder (image 10). These findings are associated with a minimal amount of pericholecystic fluid (image 9). No definite gallbladder wall thickening or definitive sonographic Murphy's sign. Common bile duct: Diameter: Normal in size measuring 4.7 mm in diameter Liver: Homogeneous hepatic  echotexture. No discrete hepatic lesions. No definite evidence of intrahepatic biliary ductal dilatation. No ascites. IMPRESSION: Cholelithiasis with findings worrisome for early acute cholecystitis including a stone lodged within the neck of the gallbladder and minimal amount of pericholecystic fluid. If the clinical diagnosis of acute cholecystitis remains uncertain, further evaluation nuclear medicine HIDA scan could be performed as indicated. Electronically Signed   By: Sandi Mariscal M.D.   On: 10/04/2015 11:42    Assessment/Plan:  Biliary colic: Patient seen in the ER for evaluation of cholecystitis versus biliary colic. At the time of consultation patient reports she is feeling much better and a strong desire not state Hospital. Discussed with the patient that we would evaluate whether or not she could tolerate a diet prior to discharge. So long as she can tolerate a diet surgeries okay with discharge with close follow-up. She already has an appointment on Monday morning with Dr. Dahlia Byes who performed her hernia repair. Should she be unable to tolerate a diet and we are willing to bring her into the hospital.    Clayburn Pert, MD [place] Surgeon  10/04/2015,1:03 PM

## 2015-10-04 NOTE — ED Provider Notes (Addendum)
[facility] Emergency Department Provider Note  ____________________________________________   I have reviewed the triage vital signs and the nursing notes.   HISTORY  Chief Complaint Chest Pain    HPI Cassidy Mathis is a 40 y.o. female with a history of known gallstones she states also hypertension which is poorly controlled. She's been off her blood pressure meds for some week or so. Patient does smoke. She has had actually epigastric and right upper quadrant pain since last night. Associated with vomiting. Nonbloody nonbilious. She is not having chest pain. She's had episodes like this before but has never lasted this long. Is always worse with food especially fatty food. She denies any fever or chills. She denies any headache or numbness or weakness.       Past Medical History:  Diagnosis Date  . Allergy    seasonal  . Anemia   . Chronic kidney disease    KIDNEY STONES CURRENTLY  . Depression   . GERD (gastroesophageal reflux disease)    PT TAKES PPI PRN BUT DOES NOT KNOW THE NAME-VERY RARE USE  . Headache   . Hypertension     Patient Active Problem List   Diagnosis Date Noted  . Incarcerated epigastric hernia     Past Surgical History:  Procedure Laterality Date  . CYST EXCISION Left 1998   wrist  . CYST EXCISION  1998   tongue  . EPIGASTRIC HERNIA REPAIR N/A 04/18/2015   Procedure: HERNIA REPAIR EPIGASTRIC ADULT;  Surgeon: Jules Husbands, MD;  Location: [facility] ORS;  Service: General;  Laterality: N/A;  . TUBAL LIGATION      Prior to Admission medications   Medication Sig Start Date End Date Taking? Authorizing Provider  clindamycin (CLEOCIN) 150 MG capsule Take 2 capsules (300 mg total) by mouth 3 (three) times daily. 09/27/15   Johnn Hai, PA-C  lisinopril-hydrochlorothiazide (PRINZIDE,ZESTORETIC) 20-25 MG tablet Take 1 tablet by mouth 1 day or 1 dose. 01/10/15   Historical Provider, MD  oxyCODONE-acetaminophen (PERCOCET) 5-325  MG tablet Take 1 tablet by mouth every 4 (four) hours as needed for severe pain. 09/27/15   Johnn Hai, PA-C    Allergies Review of patient's allergies indicates no known allergies.  Family History  Problem Relation Age of Onset  . Hypertension Mother   . Kidney Stones Mother   . Gallstones Mother   . Hypertension Father   . Gout Father   . Cancer Paternal Uncle     lung  . Cancer Maternal Grandmother     not sure what type of cancer  . Cancer Maternal Grandfather     not sure what type of cancer  . Cancer Paternal Grandmother     not sure what type of cancer  . Cancer Paternal Grandfather     not sure what type of cancer    Social History Social History  Substance Use Topics  . Smoking status: Current Every Day Smoker    Packs/day: 0.50    Years: 12.00    Types: Cigarettes  . Smokeless tobacco: Never Used  . Alcohol use 0.0 oz/week     Comment: Barely    Review of Systems Constitutional: No fever/chills Eyes: No visual changes. ENT: No sore throat. No stiff neck no neck pain Cardiovascular: Denies chest pain. Respiratory: Denies shortness of breath. Gastrointestinal:   + vomiting.  No diarrhea.  No constipation. Genitourinary: Negative for dysuria. Musculoskeletal: Negative lower extremity swelling Skin: Negative for rash. Neurological: Negative for  severe headaches, focal weakness or numbness. 10-point ROS otherwise negative.  ____________________________________________   PHYSICAL EXAM:  VITAL SIGNS: ED Triage Vitals [10/04/15 0918]  Enc Vitals Group     BP (!) 192/113     Pulse Rate 66     Resp 18     Temp 98.6 F (37 C)     Temp Source Oral     SpO2 100 %     Weight 180 lb (81.6 kg)     Height 5\' 5"  (1.651 m)     Head Circumference      Peak Flow      Pain Score 10     Pain Loc      Pain Edu?      Excl. in [place]?     Constitutional: Alert and oriented. Well appearing and in no acute distress. Eyes: Conjunctivae are normal. PERRL.  EOMI. Head: Atraumatic. Nose: No congestion/rhinnorhea. Mouth/Throat: Mucous membranes are moist.  Oropharynx non-erythematous. Neck: No stridor.   Nontender with no meningismus Cardiovascular: Normal rate, regular rhythm. Grossly normal heart sounds.  Good peripheral circulation. Respiratory: Normal respiratory effort.  No retractions. Lungs CTAB. Abdominal: There is epigastric and right upper quadrant tenderness and voluntary guarding no rebound No distention. Back:  There is no focal tenderness or step off.  there is no midline tenderness there are no lesions noted. there is no CVA tenderness Musculoskeletal: No lower extremity tenderness, no upper extremity tenderness. No joint effusions, no DVT signs strong distal pulses no edema Neurologic:  Normal speech and language. No gross focal neurologic deficits are appreciated.  Skin:  Skin is warm, dry and intact. No rash noted. Psychiatric: Mood and affect are normal. Speech and behavior are normal.  ____________________________________________   LABS (all labs ordered are listed, but only abnormal results are displayed)  Labs Reviewed  CBC - Abnormal; Notable for the following:       Result Value   Platelets 462 (*)    All other components within normal limits  HEPATIC FUNCTION PANEL - Abnormal; Notable for the following:    AST 73 (*)    ALT 179 (*)    Bilirubin, Direct <0.1 (*)    All other components within normal limits  BASIC METABOLIC PANEL  TROPONIN I  LIPASE, BLOOD   ____________________________________________  EKG  I personally interpreted any EKGs ordered by me or triage Sinus rhythm rate 69 bpm no acute ST elevation or acute ST depression normal axis, borderline LVH ____________________________________________  RADIOLOGY  I reviewed any imaging ordered by me or triage that were performed during my shift and, if possible, patient and/or family made aware of any abnormal  findings. ____________________________________________   PROCEDURES  Procedure(s) performed: None  Procedures  Critical Care performed: None  ____________________________________________   INITIAL IMPRESSION / ASSESSMENT AND PLAN / ED COURSE  Pertinent labs & imaging results that were available during my care of the patient were reviewed by me and considered in my medical decision making (see chart for details).  Patient with right upper quadrant pain abdominal discomfort and nausea and vomiting in the context of known gallstone disease. Ultrasound and her first set suggest that there is a primary goal blood pathology today. I do not believe this her percent ACS reaches and uninterrupted discomfort reproducibly in her abdomen associated with food and vomiting since last night with a negative troponin. I do not think serial enzymes aren't necessary. After pain medication her blood pressure is trending down as one would hope. This  time her blood pressures in the 140s over 90s. Patient was quite uncomfortable upon arrival and very anxious and I think that both those things did increase her blood pressure. I have discussed with surgery they'll come and evaluate the patient for possible surgical intervention for this biliary pathology.  ----------------------------------------- 12:44 PM on 10/04/2015 -----------------------------------------  Patient feeling much better, she was seen by Dr. Adonis Huguenin. He offered admission and surgery at this time she would prefer decline. We'll see if she can tolerate by mouth prior to leaving. We have advised that she stay. Risks alternatives to admission including risk benefits alternatives to departure given and understood. She understands that she change her mind or feels worse she can come back at any time.  ----------------------------------------- 1:29 PM on 10/04/2015 -----------------------------------------  Patient's firmly advised not to drive  after Dilaudid. She will call for a ride.  Clinical Course   ____________________________________________   FINAL CLINICAL IMPRESSION(S) / ED DIAGNOSES  Final diagnoses:  Abdominal pain      This chart was dictated using voice recognition software.  Despite best efforts to proofread,  errors can occur which can change meaning.       Schuyler Amor, MD 10/04/15 [place], MD 10/04/15 [place], MD 10/04/15 [phone]

## 2015-10-06 ENCOUNTER — Ambulatory Visit (INDEPENDENT_AMBULATORY_CARE_PROVIDER_SITE_OTHER): Payer: Medicaid Other | Admitting: Surgery

## 2015-10-06 ENCOUNTER — Encounter: Payer: Self-pay | Admitting: Surgery

## 2015-10-06 VITALS — BP 160/100 | HR 95 | Temp 98.7°F | Ht 65.0 in | Wt 180.0 lb

## 2015-10-06 DIAGNOSIS — K8020 Calculus of gallbladder without cholecystitis without obstruction: Secondary | ICD-10-CM | POA: Diagnosis not present

## 2015-10-06 NOTE — Progress Notes (Signed)
Patient ID: Cassidy Mathis, female   DOB: May 13, 1975, 40 y.o.   MRN: [id number]  HPI Cassidy Mathis is a 40 y.o. female seen for 2 different problems with the more concerning one is her intermittent epigastric and right upper quadrant pain that started about 2-3 days ago associated with nausea and exacerbated by heavy meals. She reports that the pain is intermittent and is moderate to severe in intensity. Pain is nonradiating and is sharp. SHe recently went to the emergency room where workup revealed evidence of gallstones by ultrasound without evidence of cholecystitis. Common bile duct was normal. I personally reviewed the images and the status myself. LFTs were normal.. Comprising would be the pilonidal cyst that and actually has been present for last 10 days or so and she has been prescribed clindamycin with good results. And out complains of some intermittent mild sacral pain that is sharp.  HPI  Past Medical History:  Diagnosis Date  . Allergy    seasonal  . Anemia   . Chronic kidney disease    KIDNEY STONES CURRENTLY  . Depression   . GERD (gastroesophageal reflux disease)    PT TAKES PPI PRN BUT DOES NOT KNOW THE NAME-VERY RARE USE  . Headache   . Hypertension     Past Surgical History:  Procedure Laterality Date  . CYST EXCISION Left 1998   wrist  . CYST EXCISION  1998   tongue  . EPIGASTRIC HERNIA REPAIR N/A 04/18/2015   Procedure: HERNIA REPAIR EPIGASTRIC ADULT;  Surgeon: Jules Husbands, MD;  Location: [facility] ORS;  Service: General;  Laterality: N/A;  . TUBAL LIGATION      Family History  Problem Relation Age of Onset  . Hypertension Mother   . Kidney Stones Mother   . Gallstones Mother   . Hypertension Father   . Gout Father   . Cancer Paternal Uncle     lung  . Cancer Maternal Grandmother     not sure what type of cancer  . Cancer Maternal Grandfather     not sure what type of cancer  . Cancer Paternal Grandmother     not sure what type of cancer  . Cancer  Paternal Grandfather     not sure what type of cancer    Social History Social History  Substance Use Topics  . Smoking status: Current Every Day Smoker    Packs/day: 0.50    Years: 12.00    Types: Cigarettes  . Smokeless tobacco: Never Used  . Alcohol use 0.0 oz/week     Comment: Barely    No Known Allergies  Current Outpatient Prescriptions  Medication Sig Dispense Refill  . clindamycin (CLEOCIN) 150 MG capsule Take 2 capsules (300 mg total) by mouth 3 (three) times daily. 60 capsule 0  . ondansetron (ZOFRAN) 4 MG tablet Take 1 tablet (4 mg total) by mouth every 8 (eight) hours as needed for nausea or vomiting. 8 tablet 0  . oxyCODONE-acetaminophen (ROXICET) 5-325 MG tablet Take 1 tablet by mouth every 4 (four) hours as needed for severe pain. 4 tablet 0   No current facility-administered medications for this visit.      Review of Systems A 10 point review of systems was asked and was negative except for the information on the HPI  Physical Exam Blood pressure (!) 160/100, pulse 95, temperature 98.7 F (37.1 C), temperature source Oral, height 5\' 5"  (1.651 m), weight 81.6 kg (180 lb), last menstrual period 10/04/2015. CONSTITUTIONAL: NAD, alert  EYES: Pupils are equal, round, and reactive to light, Sclera are non-icteric. EARS, NOSE, MOUTH AND THROAT: The oropharynx is clear. The oral mucosa is pink and moist. Hearing is intact to voice. LYMPH NODES:  Lymph nodes in the neck are normal. RESPIRATORY:  Lungs are clear. There is normal respiratory effort, with equal breath sounds bilaterally, and without pathologic use of accessory muscles. CARDIOVASCULAR: Heart is regular without murmurs, gallops, or rubs. GI: The abdomen is soft,Mild diffuse tenderness on RUQ and epigastric area. Previous epigastric hernia scar noted [id number] pilonidal Dz, no abscess or infection MUSCULOSKELETAL: Normal muscle strength and tone. No cyanosis or edema.   SKIN: Turgor is good and there  are no pathologic skin lesions or ulcers. NEUROLOGIC: Motor and sensation is grossly normal. Cranial nerves are grossly intact. PSYCH:  Oriented to person, place and time. Affect is normal.  Data Reviewed I have personally reviewed the patient's imaging, laboratory findings and medical records.    Assessment/ Plan   Symptomatic Cholelithiasis. Discussed with the patient about her disease process in detail and I do recommend laparoscopic cholecystectomy possible open. The risks, benefits, complications, treatment options, and expected outcomes were discussed with the patient. The possibilities of bleeding, recurrent infection, finding a normal gallbladder, perforation of viscus organs, damage to surrounding structures, bile leak, abscess formation, needing a drain placed, the need for additional procedures, reaction to medication, pulmonary aspiration,  failure to diagnose a condition, the possible need to convert to an open procedure, and creating a complication requiring transfusion or operation were discussed with the patient. The patient and/or family concurred with the proposed plan, giving informed consent.  We will look at the schedule S know when it is a good time for her. She wishes to have this done in the next 2 weeks if possible. I discussed with her that I was able to do it either this week or in about 10 days. And she will again check her calendar and let us know when his best timing for her    [name], MD FACS General Surgeon 10/06/2015, 3:29 PM

## 2015-10-06 NOTE — Patient Instructions (Signed)
You have requested to have your gallbladder removed. If you would like this to be done by Dr. Dahlia Byes, it can be done on 10/09/15 at [facility] or 9/25, 9/26, 9/27, or 9/28 at [facility]. Please call me as soon as you know what day you have available as OR time is First come, first serve.  You will most likely be out of work 1-2 weeks for this surgery. You will return after your post-op appointment with a lifting restriction for approximately 4 more weeks.  You will be able to eat anything you would like to following surgery. But, start by eating a bland diet and advance this as tolerated.  Please see the (blue)pre-care form that you have been given today. If you have any questions, please call our office.  Laparoscopic Cholecystectomy Laparoscopic cholecystectomy is surgery to remove the gallbladder. The gallbladder is located in the upper right part of the abdomen, behind the liver. It is a storage sac for bile, which is produced in the liver. Bile aids in the digestion and absorption of fats. Cholecystectomy is often done for inflammation of the gallbladder (cholecystitis). This condition is usually caused by a buildup of gallstones (cholelithiasis) in the gallbladder. Gallstones can block the flow of bile, and that can result in inflammation and pain. In severe cases, emergency surgery may be required. If emergency surgery is not required, you will have time to prepare for the procedure. Laparoscopic surgery is an alternative to open surgery. Laparoscopic surgery has a shorter recovery time. Your common bile duct may also need to be examined during the procedure. If stones are found in the common bile duct, they may be removed. LET [facility] CARE PROVIDER KNOW ABOUT:  Any allergies you have.  All medicines you are taking, including vitamins, herbs, eye drops, creams, and over-the-counter medicines.  Previous problems you or members of your family have had with the use of anesthetics.  Any  blood disorders you have.  Previous surgeries you have had.    Any medical conditions you have. RISKS AND COMPLICATIONS Generally, this is a safe procedure. However, problems may occur, including:  Infection.  Bleeding.  Allergic reactions to medicines.  Damage to other structures or organs.  A stone remaining in the common bile duct.  A bile leak from the cyst duct that is clipped when your gallbladder is removed.  The need to convert to open surgery, which requires a larger incision in the abdomen. This may be necessary if your surgeon thinks that it is not safe to continue with a laparoscopic procedure. BEFORE THE PROCEDURE  Ask your health care provider about:  Changing or stopping your regular medicines. This is especially important if you are taking diabetes medicines or blood thinners.  Taking medicines such as aspirin and ibuprofen. These medicines can thin your blood. Do not take these medicines before your procedure if your health care provider instructs you not to.  Follow instructions from your health care provider about eating or drinking restrictions.  Let your health care provider know if you develop a cold or an infection before surgery.  Plan to have someone take you home after the procedure.  Ask your health care provider how your surgical site will be marked or identified.  You may be given antibiotic medicine to help prevent infection. PROCEDURE  To reduce your risk of infection:  Your health care team will wash or sanitize their hands.  Your skin will be washed with soap.  An IV tube may  be inserted into one of your veins.  You will be given a medicine to make you fall asleep (general anesthetic).  A breathing tube will be placed in your mouth.  The surgeon will make several small cuts (incisions) in your abdomen.  A thin, lighted tube (laparoscope) that has a tiny camera on the end will be inserted through one of the small incisions. The  camera on the laparoscope will send a picture to a TV screen (monitor) in the operating room. This will give the surgeon a good view inside your abdomen.  A gas will be pumped into your abdomen. This will expand your abdomen to give the surgeon more room to perform the surgery.  Other tools that are needed for the procedure will be inserted through the other incisions. The gallbladder will be removed through one of the incisions.  After your gallbladder has been removed, the incisions will be closed with stitches (sutures), staples, or skin glue.  Your incisions may be covered with a bandage (dressing). The procedure may vary among health care providers and hospitals. AFTER THE PROCEDURE  Your blood pressure, heart rate, breathing rate, and blood oxygen level will be monitored often until the medicines you were given have worn off.  You will be given medicines as needed to control your pain.   This information is not intended to replace advice given to you by your health care provider. Make sure you discuss any questions you have with your health care provider.   Document Released: 01/11/2005 Document Revised: 10/02/2014 Document Reviewed: 08/23/2012 Elsevier Interactive Patient Education 2016 [place] Diet for Pancreatitis or Gallbladder Conditions A low-fat diet can be helpful if you have pancreatitis or a gallbladder condition. With these conditions, your pancreas and gallbladder have trouble digesting fats. A healthy eating plan with less fat will help rest your pancreas and gallbladder and reduce your symptoms. WHAT DO I NEED TO KNOW ABOUT THIS DIET?  Eat a low-fat diet.  Reduce your fat intake to less than 20-30% of your total daily calories. This is less than 50-60 g of fat per day.  Remember that you need some fat in your diet. Ask your dietician what your daily goal should be.  Choose nonfat and low-fat healthy foods. Look for the words "nonfat," "low fat," or  "fat free."  As a guide, look on the label and choose foods with less than 3 g of fat per serving. Eat only one serving.  Avoid alcohol.  Do not smoke. If you need help quitting, talk with your health care provider.  Eat small frequent meals instead of three large heavy meals. WHAT FOODS CAN I EAT? Grains Include healthy grains and starches such as potatoes, wheat bread, fiber-rich cereal, and brown rice. Choose whole grain options whenever possible. In adults, whole grains should account for 45-65% of your daily calories.  Fruits and Vegetables Eat plenty of fruits and vegetables. Fresh fruits and vegetables add fiber to your diet. Meats and Other Protein Sources Eat lean meat such as chicken and pork. Trim any fat off of meat before cooking it. Eggs, fish, and beans are other sources of protein. In adults, these foods should account for 10-35% of your daily calories. Dairy Choose low-fat milk and dairy options. Dairy includes fat and protein, as well as calcium.  Fats and Oils Limit high-fat foods such as fried foods, sweets, baked goods, sugary drinks.  Other Creamy sauces and condiments, such as mayonnaise, can add extra fat.  Think about whether or not you need to use them, or use smaller amounts or low fat options. WHAT FOODS ARE NOT RECOMMENDED?  High fat foods, such as:  [employer].  Ice cream.  [place] toast.  Sweet rolls.  Pizza.  Cheese bread.  Foods covered with batter, butter, creamy sauces, or cheese.  Fried foods.  Sugary drinks and desserts.  Foods that cause gas or bloating   This information is not intended to replace advice given to you by your health care provider. Make sure you discuss any questions you have with your health care provider.   Document Released: 01/16/2013 Document Reviewed: 01/16/2013 Elsevier Interactive Patient Education [employer].

## 2015-10-10 ENCOUNTER — Telehealth: Payer: Self-pay | Admitting: Surgery

## 2015-10-10 NOTE — Telephone Encounter (Signed)
Pt advised of pre op date/time and sx date. Sx: 10/21/15 with Dr Pabon--Laparoscopic cholecystectomy.  Pre op: 10/13/15 between 9-1:00pm--phone.   Patient made aware to call [phone], between 1-3:00pm the day before surgery, to find out what time to arrive.

## 2015-10-13 ENCOUNTER — Encounter
Admission: RE | Admit: 2015-10-13 | Discharge: 2015-10-13 | Disposition: A | Discharge status: Home / Self Care | Payer: Medicaid Other | Source: Ambulatory Visit | Attending: Surgery | Admitting: Surgery

## 2015-10-13 HISTORY — DX: Pilonidal cyst without abscess: L05.91

## 2015-10-13 NOTE — Patient Instructions (Signed)
  Your procedure is scheduled on: 10-21-15 Report to Same Day Surgery 2nd floor medical mall To find out your arrival time please call [phone] between 1PM - 3PM on 10-20-15  Remember: Instructions that are not followed completely may result in serious medical risk, up to and including death, or upon the discretion of your surgeon and anesthesiologist your surgery may need to be rescheduled.    _x___ 1. Do not eat food or drink liquids after midnight. No gum chewing or hard candies.     __x__ 2. No Alcohol for 24 hours before or after surgery.   __x__3. No Smoking for 24 prior to surgery.   ____  4. Bring all medications with you on the day of surgery if instructed.    __x__ 5. Notify your doctor if there is any change in your medical condition     (cold, fever, infections).     Do not wear jewelry, make-up, hairpins, clips or nail polish.  Do not wear lotions, powders, or perfumes. You may wear deodorant.  Do not shave 48 hours prior to surgery. Men may shave face and neck.  Do not bring valuables to the hospital.    [facility] is not responsible for any belongings or valuables.               Contacts, dentures or bridgework may not be worn into surgery.  Leave your suitcase in the car. After surgery it may be brought to your room.  For patients admitted to the hospital, discharge time is determined by your treatment team.   Patients discharged the day of surgery will not be allowed to drive home.    Please read over the following fact sheets that you were given:   [facility] Preparing for Surgery and or MRSA Information   ____ Take these medicines the morning of surgery with A SIP OF WATER:    1. NONE  2.  3.  4.  5.  6.  ____ Fleet Enema (as directed)   ____ Use CHG Soap or sage wipes as directed on instruction sheet   ____ Use inhalers on the day of surgery and bring to hospital day of surgery  ____ Stop metformin 2 days prior to surgery    ____ Take 1/2 of  usual insulin dose the night before surgery and none on the morning of  surgery.   ____ Stop aspirin or coumadin, or plavix  _x__ Stop Anti-inflammatories such as Advil, Aleve, Ibuprofen, Motrin, Naproxen,          Naprosyn, Goodies powders or aspirin products. Ok to continue [employer]   ____ Stop supplements until after surgery.    ____ Bring C-Pap to the hospital.

## 2015-10-14 MED ORDER — ROPIVACAINE HCL 5 MG/ML IJ SOLN
INTRAMUSCULAR | Status: AC
  Filled 2015-10-14: qty 40

## 2015-10-14 MED ORDER — MIDAZOLAM HCL 5 MG/5ML IJ SOLN
INTRAMUSCULAR | Status: AC
  Filled 2015-10-14: qty 5

## 2015-10-14 MED ORDER — LIDOCAINE HCL (PF) 1 % IJ SOLN
INTRAMUSCULAR | Status: AC
  Filled 2015-10-14: qty 5

## 2015-10-14 MED ORDER — FENTANYL CITRATE (PF) 100 MCG/2ML IJ SOLN
INTRAMUSCULAR | Status: AC
  Filled 2015-10-14: qty 2

## 2015-10-16 ENCOUNTER — Encounter: Payer: Self-pay | Admitting: *Deleted

## 2015-10-21 ENCOUNTER — Other Ambulatory Visit: Payer: Self-pay

## 2015-10-21 ENCOUNTER — Ambulatory Visit
Admission: RE | Admit: 2015-10-21 | Discharge: 2015-10-21 | Disposition: A | Discharge status: Home / Self Care | Payer: Medicaid Other | Source: Ambulatory Visit | Attending: Surgery | Admitting: Surgery

## 2015-10-21 ENCOUNTER — Encounter
Admission: RE | Disposition: A | Discharge status: Home / Self Care | Payer: Self-pay | Source: Ambulatory Visit | Attending: Surgery

## 2015-10-21 ENCOUNTER — Ambulatory Visit: Payer: Medicaid Other | Admitting: Anesthesiology

## 2015-10-21 ENCOUNTER — Encounter: Payer: Self-pay | Admitting: *Deleted

## 2015-10-21 ENCOUNTER — Ambulatory Visit: Payer: Medicaid Other

## 2015-10-21 ENCOUNTER — Telehealth: Payer: Self-pay

## 2015-10-21 DIAGNOSIS — K8010 Calculus of gallbladder with chronic cholecystitis without obstruction: Secondary | ICD-10-CM

## 2015-10-21 DIAGNOSIS — I10 Essential (primary) hypertension: Secondary | ICD-10-CM | POA: Insufficient documentation

## 2015-10-21 DIAGNOSIS — R51 Headache: Secondary | ICD-10-CM | POA: Diagnosis not present

## 2015-10-21 DIAGNOSIS — F17210 Nicotine dependence, cigarettes, uncomplicated: Secondary | ICD-10-CM | POA: Diagnosis not present

## 2015-10-21 DIAGNOSIS — Z841 Family history of disorders of kidney and ureter: Secondary | ICD-10-CM | POA: Insufficient documentation

## 2015-10-21 DIAGNOSIS — D649 Anemia, unspecified: Secondary | ICD-10-CM | POA: Diagnosis not present

## 2015-10-21 DIAGNOSIS — Z809 Family history of malignant neoplasm, unspecified: Secondary | ICD-10-CM | POA: Diagnosis not present

## 2015-10-21 DIAGNOSIS — N200 Calculus of kidney: Secondary | ICD-10-CM | POA: Insufficient documentation

## 2015-10-21 DIAGNOSIS — Z8249 Family history of ischemic heart disease and other diseases of the circulatory system: Secondary | ICD-10-CM | POA: Diagnosis not present

## 2015-10-21 DIAGNOSIS — Z8349 Family history of other endocrine, nutritional and metabolic diseases: Secondary | ICD-10-CM | POA: Insufficient documentation

## 2015-10-21 DIAGNOSIS — F329 Major depressive disorder, single episode, unspecified: Secondary | ICD-10-CM | POA: Diagnosis not present

## 2015-10-21 DIAGNOSIS — Z79899 Other long term (current) drug therapy: Secondary | ICD-10-CM | POA: Insufficient documentation

## 2015-10-21 DIAGNOSIS — Z419 Encounter for procedure for purposes other than remedying health state, unspecified: Secondary | ICD-10-CM

## 2015-10-21 DIAGNOSIS — R1011 Right upper quadrant pain: Secondary | ICD-10-CM

## 2015-10-21 HISTORY — PX: CHOLECYSTECTOMY: SHX55

## 2015-10-21 LAB — URINE DRUG SCREEN, QUALITATIVE (ARMC ONLY)
Amphetamines, Ur Screen: NOT DETECTED
BARBITURATES, UR SCREEN: NOT DETECTED
BENZODIAZEPINE, UR SCRN: NOT DETECTED
COCAINE METABOLITE, UR ~~LOC~~: NOT DETECTED
Cannabinoid 50 Ng, Ur ~~LOC~~: POSITIVE — AB
MDMA (Ecstasy)Ur Screen: NOT DETECTED
METHADONE SCREEN, URINE: NOT DETECTED
Opiate, Ur Screen: NOT DETECTED
Phencyclidine (PCP) Ur S: NOT DETECTED
TRICYCLIC, UR SCREEN: NOT DETECTED

## 2015-10-21 LAB — POCT PREGNANCY, URINE: PREG TEST UR: NEGATIVE

## 2015-10-21 SURGERY — LAPAROSCOPIC CHOLECYSTECTOMY WITH INTRAOPERATIVE CHOLANGIOGRAM
Anesthesia: General

## 2015-10-21 MED ORDER — CHLORHEXIDINE GLUCONATE CLOTH 2 % EX PADS: 6.0000 | MEDICATED_PAD | Freq: Once | CUTANEOUS | Status: DC

## 2015-10-21 MED ORDER — LACTATED RINGERS IV SOLN
INTRAVENOUS | Status: DC
  Administered 2015-10-21 (×2): via INTRAVENOUS

## 2015-10-21 MED ORDER — FAMOTIDINE 20 MG PO TABS
20.0000 mg | ORAL_TABLET | Freq: Once | ORAL | Status: AC
  Administered 2015-10-21: 20 mg via ORAL

## 2015-10-21 MED ORDER — IOPAMIDOL (ISOVUE-300) INJECTION 61%
INTRAVENOUS | Status: DC | PRN
  Administered 2015-10-21: 10 mL via INTRAVENOUS

## 2015-10-21 MED ORDER — SUCCINYLCHOLINE CHLORIDE 20 MG/ML IJ SOLN
INTRAMUSCULAR | Status: DC | PRN
  Administered 2015-10-21: 100 mg via INTRAVENOUS

## 2015-10-21 MED ORDER — PROPOFOL 10 MG/ML IV BOLUS
INTRAVENOUS | Status: DC | PRN
  Administered 2015-10-21: 200 mg via INTRAVENOUS

## 2015-10-21 MED ORDER — ONDANSETRON HCL 4 MG/2ML IJ SOLN
INTRAMUSCULAR | Status: DC | PRN
  Administered 2015-10-21: 4 mg via INTRAVENOUS

## 2015-10-21 MED ORDER — GLUCAGON HCL RDNA (DIAGNOSTIC) 1 MG IJ SOLR
INTRAMUSCULAR | Status: DC | PRN
  Administered 2015-10-21: 1 mg via INTRAVENOUS

## 2015-10-21 MED ORDER — MIDAZOLAM HCL 2 MG/2ML IJ SOLN
INTRAMUSCULAR | Status: DC | PRN
  Administered 2015-10-21: 2 mg via INTRAVENOUS

## 2015-10-21 MED ORDER — LIDOCAINE HCL (CARDIAC) 20 MG/ML IV SOLN
INTRAVENOUS | Status: DC | PRN
  Administered 2015-10-21: 100 mg via INTRAVENOUS

## 2015-10-21 MED ORDER — ACETAMINOPHEN 10 MG/ML IV SOLN
INTRAVENOUS | Status: DC | PRN
  Administered 2015-10-21: 1000 mg via INTRAVENOUS

## 2015-10-21 MED ORDER — ONDANSETRON HCL 4 MG/2ML IJ SOLN
INTRAMUSCULAR | Status: AC
  Filled 2015-10-21: qty 2

## 2015-10-21 MED ORDER — FAMOTIDINE 20 MG PO TABS
ORAL_TABLET | ORAL | Status: AC
  Filled 2015-10-21: qty 1

## 2015-10-21 MED ORDER — BUPIVACAINE-EPINEPHRINE 0.25% -1:200000 IJ SOLN
INTRAMUSCULAR | Status: DC | PRN
  Administered 2015-10-21: 30 mL

## 2015-10-21 MED ORDER — PHENYLEPHRINE HCL 10 MG/ML IJ SOLN
INTRAMUSCULAR | Status: DC | PRN
  Administered 2015-10-21 (×2): 100 ug via INTRAVENOUS

## 2015-10-21 MED ORDER — SUGAMMADEX SODIUM 200 MG/2ML IV SOLN
INTRAVENOUS | Status: DC | PRN
  Administered 2015-10-21: 163.2 mg via INTRAVENOUS

## 2015-10-21 MED ORDER — GABAPENTIN 300 MG PO CAPS
300.0000 mg | ORAL_CAPSULE | ORAL | Status: AC
  Administered 2015-10-21: 300 mg via ORAL

## 2015-10-21 MED ORDER — FENTANYL CITRATE (PF) 100 MCG/2ML IJ SOLN
INTRAMUSCULAR | Status: AC
  Filled 2015-10-21: qty 2

## 2015-10-21 MED ORDER — ROCURONIUM BROMIDE 100 MG/10ML IV SOLN
INTRAVENOUS | Status: DC | PRN
  Administered 2015-10-21: 20 mg via INTRAVENOUS
  Administered 2015-10-21: 10 mg via INTRAVENOUS
  Administered 2015-10-21: 40 mg via INTRAVENOUS
  Administered 2015-10-21: 10 mg via INTRAVENOUS

## 2015-10-21 MED ORDER — ONDANSETRON HCL 4 MG/2ML IJ SOLN
4.0000 mg | Freq: Once | INTRAMUSCULAR | Status: AC | PRN
  Administered 2015-10-21: 4 mg via INTRAVENOUS

## 2015-10-21 MED ORDER — CEFAZOLIN SODIUM-DEXTROSE 2-4 GM/100ML-% IV SOLN
INTRAVENOUS | Status: AC
  Filled 2015-10-21: qty 100

## 2015-10-21 MED ORDER — CEFAZOLIN SODIUM-DEXTROSE 2-4 GM/100ML-% IV SOLN
2.0000 g | INTRAVENOUS | Status: AC
  Administered 2015-10-21: 2 g via INTRAVENOUS

## 2015-10-21 MED ORDER — SODIUM CHLORIDE 0.9 % IJ SOLN
INTRAMUSCULAR | Status: AC
Start: 2015-10-21 — End: 2015-10-21
  Filled 2015-10-21: qty 50

## 2015-10-21 MED ORDER — KETOROLAC TROMETHAMINE 30 MG/ML IJ SOLN
INTRAMUSCULAR | Status: DC | PRN
  Administered 2015-10-21: 30 mg via INTRAVENOUS

## 2015-10-21 MED ORDER — ACETAMINOPHEN 500 MG PO TABS
ORAL_TABLET | ORAL | Status: AC
  Filled 2015-10-21: qty 2

## 2015-10-21 MED ORDER — ACETAMINOPHEN 10 MG/ML IV SOLN
INTRAVENOUS | Status: AC
  Filled 2015-10-21: qty 100

## 2015-10-21 MED ORDER — ACETAMINOPHEN 500 MG PO TABS
1000.0000 mg | ORAL_TABLET | ORAL | Status: AC
  Administered 2015-10-21: 1000 mg via ORAL

## 2015-10-21 MED ORDER — GLUCAGON HCL RDNA (DIAGNOSTIC) 1 MG IJ SOLR
INTRAMUSCULAR | Status: AC
  Filled 2015-10-21: qty 1

## 2015-10-21 MED ORDER — BUPIVACAINE-EPINEPHRINE (PF) 0.25% -1:200000 IJ SOLN
INTRAMUSCULAR | Status: AC
  Filled 2015-10-21: qty 30

## 2015-10-21 MED ORDER — FENTANYL CITRATE (PF) 100 MCG/2ML IJ SOLN
INTRAMUSCULAR | Status: DC | PRN
  Administered 2015-10-21 (×2): 50 ug via INTRAVENOUS
  Administered 2015-10-21: 100 ug via INTRAVENOUS

## 2015-10-21 MED ORDER — DEXAMETHASONE SODIUM PHOSPHATE 10 MG/ML IJ SOLN
INTRAMUSCULAR | Status: DC | PRN
  Administered 2015-10-21: 10 mg via INTRAVENOUS

## 2015-10-21 MED ORDER — GABAPENTIN 300 MG PO CAPS
ORAL_CAPSULE | ORAL | Status: AC
  Filled 2015-10-21: qty 1

## 2015-10-21 MED ORDER — OXYCODONE-ACETAMINOPHEN 7.5-325 MG PO TABS: 2.0000 | ORAL_TABLET | ORAL | 0 refills | Status: DC | PRN

## 2015-10-21 MED ORDER — FENTANYL CITRATE (PF) 100 MCG/2ML IJ SOLN
25.0000 ug | INTRAMUSCULAR | Status: AC | PRN
  Administered 2015-10-21 (×6): 25 ug via INTRAVENOUS

## 2015-10-21 SURGICAL SUPPLY — 46 items
APPLICATOR COTTON TIP 6IN STRL (MISCELLANEOUS) ×3 IMPLANT
APPLIER CLIP 5 13 M/L LIGAMAX5 (MISCELLANEOUS) ×6
APR CLP MED LRG 5 ANG JAW (MISCELLANEOUS) ×4
BLADE SURG 15 STRL LF DISP TIS (BLADE) ×2 IMPLANT
BLADE SURG 15 STRL SS (BLADE) ×3
CANISTER SUCT 1200ML W/VALVE (MISCELLANEOUS) ×3 IMPLANT
CHLORAPREP W/TINT 26ML (MISCELLANEOUS) ×3 IMPLANT
CHOLANGIOGRAM CATH TAUT (CATHETERS) ×2 IMPLANT
CLEANER CAUTERY TIP 5X5 PAD (MISCELLANEOUS) ×2 IMPLANT
CLIP APPLIE 5 13 M/L LIGAMAX5 (MISCELLANEOUS) ×3 IMPLANT
DECANTER SPIKE VIAL GLASS SM (MISCELLANEOUS) ×6 IMPLANT
DEVICE TROCAR PUNCTURE CLOSURE (ENDOMECHANICALS) IMPLANT
DRAPE C-ARM XRAY 36X54 (DRAPES) ×3 IMPLANT
ELECT REM PT RETURN 9FT ADLT (ELECTROSURGICAL) ×3
ELECTRODE REM PT RTRN 9FT ADLT (ELECTROSURGICAL) ×2 IMPLANT
ENDOPOUCH RETRIEVER 10 (MISCELLANEOUS) ×3 IMPLANT
GLOVE BIO SURGEON STRL SZ7 (GLOVE) ×3 IMPLANT
GOWN STRL REUS W/ TWL LRG LVL3 (GOWN DISPOSABLE) ×6 IMPLANT
GOWN STRL REUS W/TWL LRG LVL3 (GOWN DISPOSABLE) ×9
IRRIGATION STRYKERFLOW (MISCELLANEOUS) ×2 IMPLANT
IRRIGATOR STRYKERFLOW (MISCELLANEOUS) ×3
IV CATH ANGIO 12GX3 LT BLUE (NEEDLE) ×3 IMPLANT
IV NS 1000ML (IV SOLUTION) ×3
IV NS 1000ML BAXH (IV SOLUTION) ×2 IMPLANT
L-HOOK LAP DISP 36CM (ELECTROSURGICAL) ×3
LHOOK LAP DISP 36CM (ELECTROSURGICAL) ×2 IMPLANT
LIQUID BAND (GAUZE/BANDAGES/DRESSINGS) ×3 IMPLANT
NEEDLE HYPO 22GX1.5 SAFETY (NEEDLE) ×3 IMPLANT
PACK LAP CHOLECYSTECTOMY (MISCELLANEOUS) ×3 IMPLANT
PAD CLEANER CAUTERY TIP 5X5 (MISCELLANEOUS) ×1
PENCIL ELECTRO HAND CTR (MISCELLANEOUS) ×3 IMPLANT
SCISSORS METZENBAUM CVD 33 (INSTRUMENTS) ×3 IMPLANT
SLEEVE ENDOPATH XCEL 5M (ENDOMECHANICALS) ×6 IMPLANT
SOL ANTI-FOG 6CC FOG-OUT (MISCELLANEOUS) ×2 IMPLANT
SOL FOG-OUT ANTI-FOG 6CC (MISCELLANEOUS) ×1
SPONGE LAP 18X18 5 PK (GAUZE/BANDAGES/DRESSINGS) ×3 IMPLANT
STOPCOCK 3 WAY  REPLAC (MISCELLANEOUS) IMPLANT
SUT ETHIBOND 0 MO6 C/R (SUTURE) IMPLANT
SUT MNCRL AB 4-0 PS2 18 (SUTURE) ×3 IMPLANT
SUT VIC AB 0 CT2 27 (SUTURE) ×2 IMPLANT
SUT VICRYL 0 AB UR-6 (SUTURE) ×6 IMPLANT
SYR 20CC LL (SYRINGE) ×3 IMPLANT
TROCAR XCEL BLUNT TIP 100MML (ENDOMECHANICALS) ×3 IMPLANT
TROCAR XCEL NON-BLD 5MMX100MML (ENDOMECHANICALS) ×3 IMPLANT
TUBING INSUFFLATOR HI FLOW (MISCELLANEOUS) ×3 IMPLANT
WATER STERILE IRR 1000ML POUR (IV SOLUTION) ×3 IMPLANT

## 2015-10-21 NOTE — Transfer of Care (Signed)
Immediate Anesthesia Transfer of Care Note  Patient: Cassidy Mathis  Procedure(s) Performed: Procedure(s): LAPAROSCOPIC CHOLECYSTECTOMY WITH INTRAOPERATIVE CHOLANGIOGRAM  Patient Location: PACU  Anesthesia Type:General  Level of Consciousness: sedated  Airway & Oxygen Therapy: Patient Spontanous Breathing and Patient connected to face mask oxygen  Post-op Assessment: Report given to RN and Post -op Vital signs reviewed and stable  Post vital signs: Reviewed and stable  Last Vitals:  Vitals:   10/21/15 0852  BP: (!) 147/98  Pulse: 82  Resp: 16  Temp: 37.2 C    Last Pain:  Vitals:   10/21/15 0852  TempSrc: Tympanic         Complications: No apparent anesthesia complications

## 2015-10-21 NOTE — Anesthesia Preprocedure Evaluation (Signed)
Anesthesia Evaluation  Patient identified by MRN, date of birth, ID band Patient awake    Reviewed: Allergy & Precautions, H&P , NPO status , Patient's Chart, lab work & pertinent test results, reviewed documented beta blocker date and time   Airway Mallampati: II  TM Distance: >3 FB Neck ROM: full    Dental  (+) Teeth Intact   Pulmonary neg pulmonary ROS, Current Smoker,    Pulmonary exam normal        Cardiovascular hypertension, negative cardio ROS Normal cardiovascular exam Rhythm:regular Rate:Normal     Neuro/Psych  Headaches, PSYCHIATRIC DISORDERS negative neurological ROS  negative psych ROS   GI/Hepatic negative GI ROS, Neg liver ROS, GERD  ,  Endo/Other  negative endocrine ROS  Renal/GU Renal diseasenegative Renal ROS  negative genitourinary   Musculoskeletal   Abdominal   Peds  Hematology negative hematology ROS (+) anemia ,   Anesthesia Other Findings Past Medical History: No date: Allergy     Comment: seasonal No date: Anemia No date: Chronic kidney disease     Comment: KIDNEY STONES CURRENTLY No date: Depression No date: GERD (gastroesophageal reflux disease)     Comment: NO MEDS No date: Headache     Comment: MIGRAINES No date: Hypertension     Comment: PT STATES SHE IS SUPPOSED TO BE TAKING               LISINOPRIL-HCTZ BUT HAS BEEN OUT "FOR A WHILE"               NEEDS TO GET ANOTHER PRESCRIPTION FROM HER PCP No date: Pilonidal cyst     Comment: TOOK ANTIBIOTIC THIS MONTH AND IT IS RESOLVED Past Surgical History: 1998: CYST EXCISION Left     Comment: wrist 1998: CYST EXCISION     Comment: tongue 04/18/2015: EPIGASTRIC HERNIA REPAIR N/A     Comment: Procedure: HERNIA REPAIR EPIGASTRIC ADULT;                Surgeon: Jules Husbands, MD;  Location: [facility]               ORS;  Service: General;  Laterality: N/A; No date: TUBAL LIGATION BMI    Body Mass Index:  29.95 kg/m     Reproductive/Obstetrics negative OB ROS                             Anesthesia Physical Anesthesia Plan  ASA: III  Anesthesia Plan: General ETT   Post-op Pain Management:    Induction:   Airway Management Planned:   Additional Equipment:   Intra-op Plan:   Post-operative Plan:   Informed Consent: I have reviewed the patients History and Physical, chart, labs and discussed the procedure including the risks, benefits and alternatives for the proposed anesthesia with the patient or authorized representative who has indicated his/her understanding and acceptance.   Dental Advisory Given  Plan Discussed with: CRNA  Anesthesia Plan Comments:         Anesthesia Quick Evaluation

## 2015-10-21 NOTE — Discharge Instructions (Signed)

## 2015-10-21 NOTE — Op Note (Signed)
Laparoscopic Cholecystectomy with IOC  Pre-operative Diagnosis: Symptomatic cholelithiasis  Post-operative Diagnosis: same  Procedure: Laparoscopic cholecystectomy with intraoperative cholangiogram  Surgeon: [name], MD FACS  Anesthesia: Gen. with endotracheal tube   Findings: Gallbladder filled with stones Cholangiogram showing: Generous cystic duct, normal common bile duct no evidence of common bile duct injury , contrast did not reach the duodenum.  Estimated Blood Loss: 50cc         Drains: none         Specimens: Gallbladder           Complications: none   Procedure Details  The patient was seen again in the Holding Room. The benefits, complications, treatment options, and expected outcomes were discussed with the patient. The risks of bleeding, infection, recurrence of symptoms, failure to resolve symptoms, bile duct damage, bile duct leak, retained common bile duct stone, bowel injury, any of which could require further surgery and/or ERCP, stent, or papillotomy were reviewed with the patient. The likelihood of improving the patient's symptoms with return to their baseline status is good.  The patient and/or family concurred with the proposed plan, giving informed consent.  The patient was taken to Operating Room, identified as Cassidy Mathis and the procedure verified as Laparoscopic Cholecystectomy.  A Time Out was held and the above information confirmed.  Prior to the induction of general anesthesia, antibiotic prophylaxis was administered. VTE prophylaxis was in place. General endotracheal anesthesia was then administered and tolerated well. After the induction, the abdomen was prepped with Chloraprep and draped in the sterile fashion. The patient was positioned in the supine position.  Local anesthetic  was injected into the skin near the umbilicus and an incision made. Cut down technique was used to enter the abdominal cavity and a Hasson trochar was placed after  two vicryl stitches were anchored to the fascia. Pneumoperitoneum was then created with CO2 and tolerated well without any adverse changes in the patient's vital signs.  Three 5-mm ports were placed in the right upper quadrant all under direct vision. All skin incisions  were infiltrated with a local anesthetic agent before making the incision and placing the trocars.   The patient was positioned  in reverse Trendelenburg, tilted slightly to the patient's left.  The gallbladder was identified, the fundus grasped and retracted cephalad. Adhesions were lysed bluntly. The infundibulum was grasped and retracted laterally, exposing the peritoneum overlying the triangle of Calot. This was then divided and exposed in a blunt fashion. An extended critical view of the cystic duct and cystic artery was obtained.  The cystic duct was clearly identified and bluntly dissected. The cystic duct was long, tortuous and large and because of these reasons I decided to do an intraoperative cholangiogram. A cholangiogram was done inserted and a catheter into the cystic duct and injecting contrast. The contrast did not reach the duodenum and even after giving 1 mg of glucagon. I did flush the and cystic duct but repeat cholangiogram show no evidence of contrast into the duodenum. There was no evidence of common bile duct injury and there was normal anatomy from hepatic radicles and common bile duct. A cholangiogram was removed and the  Artery and duct were double clipped and divided.  The gallbladder was taken from the gallbladder fossa in a retrograde fashion with the electrocautery. The gallbladder was removed and placed in an Endocatch bag. The liver bed was irrigated and inspected. Hemostasis was achieved with the electrocautery. Copious irrigation was utilized and was repeatedly aspirated  until clear.  The gallbladder and Endocatch sac were then removed through the epigastric port site.   Inspection of the right upper  quadrant was performed. No bleeding, bile duct injury or leak, or bowel injury was noted. Pneumoperitoneum was released.  The periumbilical port site was closed with figure-of-eight 0 Vicryl sutures. 4-0 subcuticular Monocryl was used to close the skin. Dermabond was  applied.  The patient was then extubated and brought to the recovery room in stable condition. Sponge, lap, and needle counts were correct at closure and at the conclusion of the case.  D/w with Dr. Allen Norris in detail and likely cholangiogram findings consistent with some spasms of the common bile duct. There was no evidence of a meniscus sign or evidence of biliary obstruction by clinical exam or ultrasound findings. I will follow her LFTs and ultrasound as well as clinical symptoms. She may require postoperative MRCP and and if this is positive and ERCP. I will try noninvasive workup for now              [name], MD, FACS

## 2015-10-21 NOTE — OR Nursing (Signed)
Pt up to chair, states nausea "just a little", requests to go home, taking few sips water, up to bathroom for void and back to room, tolerated well, iv d/c'd, via w/c to vehicle for d/c to home with family

## 2015-10-21 NOTE — Anesthesia Postprocedure Evaluation (Signed)
Anesthesia Post Note  Patient: Cassidy Mathis  Procedure(s) Performed: Procedure(s): LAPAROSCOPIC CHOLECYSTECTOMY WITH INTRAOPERATIVE CHOLANGIOGRAM  Patient location during evaluation: PACU Anesthesia Type: General Level of consciousness: awake and alert Pain management: pain level controlled Vital Signs Assessment: post-procedure vital signs reviewed and stable Respiratory status: spontaneous breathing, nonlabored ventilation, respiratory function stable and patient connected to nasal cannula oxygen Cardiovascular status: blood pressure returned to baseline and stable Postop Assessment: no signs of nausea or vomiting Anesthetic complications: no    Last Vitals:  Vitals:   10/21/15 0852 10/21/15 1209  BP: (!) 147/98   Pulse: 82   Resp: 16   Temp: 37.2 C (!) 36 C    Last Pain:  Vitals:   10/21/15 1209  TempSrc:   PainSc: [place]

## 2015-10-21 NOTE — Telephone Encounter (Signed)
Spoke with Dr. Dahlia Byes at this time. He would like patient to have RUQ Ultrasound and labs prior to appointment on  Friday am.   [place] scheduled for 10/24/15 at [facility]. Pt is to arrive at 0815 and be NPO.  She will have her labs done while in [place] as well and then will be seen in the [place] at 0930am to evaluate plan of care.   Patient needs to remain NPO until seen by the Physician.  Will call patient with this information this afternoon as she is currently in recovery.

## 2015-10-21 NOTE — Anesthesia Procedure Notes (Signed)
Procedure Name: Intubation Date/Time: 10/21/2015 10:19 AM Performed by: Nelda Marseille Pre-anesthesia Checklist: Patient identified, Patient being monitored, Timeout performed, Emergency Drugs available and Suction available Patient Re-evaluated:Patient Re-evaluated prior to inductionOxygen Delivery Method: Circle system utilized Preoxygenation: Pre-oxygenation with 100% oxygen Intubation Type: IV induction Ventilation: Mask ventilation without difficulty Laryngoscope Size: Mac and 3 Grade View: Grade I Tube type: Oral Tube size: 7.0 mm Number of attempts: 1 Airway Equipment and Method: Stylet Placement Confirmation: ETT inserted through vocal cords under direct vision,  positive ETCO2 and breath sounds checked- equal and bilateral Secured at: 21 cm Tube secured with: Tape Dental Injury: Teeth and Oropharynx as per pre-operative assessment

## 2015-10-21 NOTE — Interval H&P Note (Signed)
History and Physical Interval Note:  10/21/2015 9:35 AM  Cassidy Mathis  has presented today for surgery, with the diagnosis of Symptomatic gallstones  The various methods of treatment have been discussed with the patient and family. After consideration of risks, benefits and other options for treatment, the patient has consented to  Procedure(s): LAPAROSCOPIC CHOLECYSTECTOMY (N/A) as a surgical intervention .  The patient's history has been reviewed, patient examined, no change in status, stable for surgery.  I have reviewed the patient's chart and labs.  Questions were answered to the patient's satisfaction.     [place]

## 2015-10-21 NOTE — H&P (View-Only) (Signed)
Patient ID: SAINT GOTTFRIED, female   DOB: 07/20/1975, 40 y.o.   MRN: [id number]  HPI Cassidy Mathis is a 40 y.o. female seen for 2 different problems with the more concerning one is her intermittent epigastric and right upper quadrant pain that started about 2-3 days ago associated with nausea and exacerbated by heavy meals. She reports that the pain is intermittent and is moderate to severe in intensity. Pain is nonradiating and is sharp. SHe recently went to the emergency room where workup revealed evidence of gallstones by ultrasound without evidence of cholecystitis. Common bile duct was normal. I personally reviewed the images and the status myself. LFTs were normal.. Comprising would be the pilonidal cyst that and actually has been present for last 10 days or so and she has been prescribed clindamycin with good results. And out complains of some intermittent mild sacral pain that is sharp.  HPI  Past Medical History:  Diagnosis Date  . Allergy    seasonal  . Anemia   . Chronic kidney disease    KIDNEY STONES CURRENTLY  . Depression   . GERD (gastroesophageal reflux disease)    PT TAKES PPI PRN BUT DOES NOT KNOW THE NAME-VERY RARE USE  . Headache   . Hypertension     Past Surgical History:  Procedure Laterality Date  . CYST EXCISION Left 1998   wrist  . CYST EXCISION  1998   tongue  . EPIGASTRIC HERNIA REPAIR N/A 04/18/2015   Procedure: HERNIA REPAIR EPIGASTRIC ADULT;  Surgeon: Jules Husbands, MD;  Location: [facility] ORS;  Service: General;  Laterality: N/A;  . TUBAL LIGATION      Family History  Problem Relation Age of Onset  . Hypertension Mother   . Kidney Stones Mother   . Gallstones Mother   . Hypertension Father   . Gout Father   . Cancer Paternal Uncle     lung  . Cancer Maternal Grandmother     not sure what type of cancer  . Cancer Maternal Grandfather     not sure what type of cancer  . Cancer Paternal Grandmother     not sure what type of cancer  . Cancer  Paternal Grandfather     not sure what type of cancer    Social History Social History  Substance Use Topics  . Smoking status: Current Every Day Smoker    Packs/day: 0.50    Years: 12.00    Types: Cigarettes  . Smokeless tobacco: Never Used  . Alcohol use 0.0 oz/week     Comment: Barely    No Known Allergies  Current Outpatient Prescriptions  Medication Sig Dispense Refill  . clindamycin (CLEOCIN) 150 MG capsule Take 2 capsules (300 mg total) by mouth 3 (three) times daily. 60 capsule 0  . ondansetron (ZOFRAN) 4 MG tablet Take 1 tablet (4 mg total) by mouth every 8 (eight) hours as needed for nausea or vomiting. 8 tablet 0  . oxyCODONE-acetaminophen (ROXICET) 5-325 MG tablet Take 1 tablet by mouth every 4 (four) hours as needed for severe pain. 4 tablet 0   No current facility-administered medications for this visit.      Review of Systems A 10 point review of systems was asked and was negative except for the information on the HPI  Physical Exam Blood pressure (!) 160/100, pulse 95, temperature 98.7 F (37.1 C), temperature source Oral, height 5\' 5"  (1.651 m), weight 81.6 kg (180 lb), last menstrual period 10/04/2015. CONSTITUTIONAL: NAD, alert  EYES: Pupils are equal, round, and reactive to light, Sclera are non-icteric. EARS, NOSE, MOUTH AND THROAT: The oropharynx is clear. The oral mucosa is pink and moist. Hearing is intact to voice. LYMPH NODES:  Lymph nodes in the neck are normal. RESPIRATORY:  Lungs are clear. There is normal respiratory effort, with equal breath sounds bilaterally, and without pathologic use of accessory muscles. CARDIOVASCULAR: Heart is regular without murmurs, gallops, or rubs. GI: The abdomen is soft,Mild diffuse tenderness on RUQ and epigastric area. Previous epigastric hernia scar noted [id number] pilonidal Dz, no abscess or infection MUSCULOSKELETAL: Normal muscle strength and tone. No cyanosis or edema.   SKIN: Turgor is good and there  are no pathologic skin lesions or ulcers. NEUROLOGIC: Motor and sensation is grossly normal. Cranial nerves are grossly intact. PSYCH:  Oriented to person, place and time. Affect is normal.  Data Reviewed I have personally reviewed the patient's imaging, laboratory findings and medical records.    Assessment/ Plan   Symptomatic Cholelithiasis. Discussed with the patient about her disease process in detail and I do recommend laparoscopic cholecystectomy possible open. The risks, benefits, complications, treatment options, and expected outcomes were discussed with the patient. The possibilities of bleeding, recurrent infection, finding a normal gallbladder, perforation of viscus organs, damage to surrounding structures, bile leak, abscess formation, needing a drain placed, the need for additional procedures, reaction to medication, pulmonary aspiration,  failure to diagnose a condition, the possible need to convert to an open procedure, and creating a complication requiring transfusion or operation were discussed with the patient. The patient and/or family concurred with the proposed plan, giving informed consent.  We will look at the schedule S know when it is a good time for her. She wishes to have this done in the next 2 weeks if possible. I discussed with her that I was able to do it either this week or in about 10 days. And she will again check her calendar and let us know when his best timing for her    [name], MD FACS General Surgeon 10/06/2015, 3:29 PM

## 2015-10-22 LAB — SURGICAL PATHOLOGY

## 2015-10-23 ENCOUNTER — Other Ambulatory Visit: Payer: Self-pay

## 2015-10-23 DIAGNOSIS — R1011 Right upper quadrant pain: Secondary | ICD-10-CM

## 2015-10-23 NOTE — Telephone Encounter (Signed)
Spoke with patient at this time. All information regarding testing and appointment has been given to patient. Patient states that she will remain NPO until seen by physician tomorrow.

## 2015-10-24 ENCOUNTER — Encounter: Payer: Self-pay | Admitting: Surgery

## 2015-10-24 ENCOUNTER — Telehealth: Payer: Self-pay

## 2015-10-24 ENCOUNTER — Ambulatory Visit
Admission: RE | Admit: 2015-10-24 | Discharge: 2015-10-24 | Disposition: A | Discharge status: Home / Self Care | Payer: Medicaid Other | Source: Ambulatory Visit | Attending: Surgery | Admitting: Surgery

## 2015-10-24 ENCOUNTER — Ambulatory Visit: Admission: RE | Admit: 2015-10-24 | Payer: Medicaid Other | Source: Ambulatory Visit | Admitting: Surgery

## 2015-10-24 ENCOUNTER — Other Ambulatory Visit
Admission: RE | Admit: 2015-10-24 | Discharge: 2015-10-24 | Disposition: A | Discharge status: Home / Self Care | Payer: Medicaid Other | Source: Ambulatory Visit | Attending: Surgery | Admitting: Surgery

## 2015-10-24 ENCOUNTER — Ambulatory Visit (INDEPENDENT_AMBULATORY_CARE_PROVIDER_SITE_OTHER): Payer: Medicaid Other | Admitting: Surgery

## 2015-10-24 VITALS — BP 166/93 | HR 61 | Temp 98.3°F | Wt 179.0 lb

## 2015-10-24 DIAGNOSIS — Z09 Encounter for follow-up examination after completed treatment for conditions other than malignant neoplasm: Secondary | ICD-10-CM

## 2015-10-24 DIAGNOSIS — R1011 Right upper quadrant pain: Secondary | ICD-10-CM | POA: Diagnosis not present

## 2015-10-24 LAB — COMPREHENSIVE METABOLIC PANEL
ALT: 236 U/L — ABNORMAL HIGH (ref 14–54)
ANION GAP: 1 — AB (ref 5–15)
AST: 64 U/L — AB (ref 15–41)
Albumin: 3.6 g/dL (ref 3.5–5.0)
Alkaline Phosphatase: 97 U/L (ref 38–126)
BUN: 11 mg/dL (ref 6–20)
CHLORIDE: 108 mmol/L (ref 101–111)
CO2: 29 mmol/L (ref 22–32)
Calcium: 8.9 mg/dL (ref 8.9–10.3)
Creatinine, Ser: 0.99 mg/dL (ref 0.44–1.00)
Glucose, Bld: 88 mg/dL (ref 65–99)
POTASSIUM: 3.8 mmol/L (ref 3.5–5.1)
Sodium: 138 mmol/L (ref 135–145)
Total Bilirubin: 0.6 mg/dL (ref 0.3–1.2)
Total Protein: 6.9 g/dL (ref 6.5–8.1)

## 2015-10-24 MED ORDER — ONDANSETRON 4 MG PO TBDP: 4.0000 mg | ORAL_TABLET | Freq: Three times a day (TID) | ORAL | 0 refills | Status: DC | PRN

## 2015-10-24 NOTE — Telephone Encounter (Signed)
Spoke with patient at this time to let her know that we will not call in Pain medication. She was instructed to take Tylenol or Ibuprofen or if the emergency room.  She has asked if we could call in something for nausea as well and she was instructed to check with her drug store later today. She requested we send it to [facility] on [address] [place].

## 2015-10-24 NOTE — Addendum Note (Signed)
Addended by: Wayna Chalet on: 10/24/2015 11:28 AM   Modules accepted: Orders

## 2015-10-24 NOTE — Patient Instructions (Addendum)
Please go to the [facility] and have your blood drawn before coming to your appointment.

## 2015-10-24 NOTE — Telephone Encounter (Signed)
Patient called in to ask if we could call in a refill on her pain medication. She was in the bathroom taking a pill, she sneezed and all the pills fell into the toilet.

## 2015-10-24 NOTE — Progress Notes (Signed)
S/p lap chole a few day ago. IOC showing no constrast on duodenum but no clinical evidence of obstruction. ? Spasm vs cbd stone. C/o some right back pain likely from muskuloskeletal etiology She has been doing well, taking po, no fevers, no evidence of obstrutive jaundice U/S some post op changes but nml CBD Mild increase AST ( preop she had mild increase) nml Bili  PE NAD, non toxic Abd: soft, mild incisional tenderness no peritonitis  A/P overall doing well F/u w me next week w CMP if persistent increase in LFT will do MRCP No immediate surgical intervention

## 2015-10-31 ENCOUNTER — Telehealth: Payer: Self-pay

## 2015-10-31 ENCOUNTER — Other Ambulatory Visit
Admission: RE | Admit: 2015-10-31 | Discharge: 2015-10-31 | Disposition: A | Discharge status: Home / Self Care | Payer: Medicaid Other | Source: Ambulatory Visit | Attending: Surgery | Admitting: Surgery

## 2015-10-31 ENCOUNTER — Ambulatory Visit (INDEPENDENT_AMBULATORY_CARE_PROVIDER_SITE_OTHER): Payer: Medicaid Other | Admitting: Surgery

## 2015-10-31 DIAGNOSIS — R1013 Epigastric pain: Secondary | ICD-10-CM | POA: Diagnosis not present

## 2015-10-31 DIAGNOSIS — Z09 Encounter for follow-up examination after completed treatment for conditions other than malignant neoplasm: Secondary | ICD-10-CM

## 2015-10-31 LAB — COMPREHENSIVE METABOLIC PANEL
ALT: 64 U/L — AB (ref 14–54)
AST: 27 U/L (ref 15–41)
Albumin: 3.7 g/dL (ref 3.5–5.0)
Alkaline Phosphatase: 75 U/L (ref 38–126)
Anion gap: 5 (ref 5–15)
BILIRUBIN TOTAL: 0.2 mg/dL — AB (ref 0.3–1.2)
BUN: 14 mg/dL (ref 6–20)
CO2: 27 mmol/L (ref 22–32)
CREATININE: 0.92 mg/dL (ref 0.44–1.00)
Calcium: 8.8 mg/dL — ABNORMAL LOW (ref 8.9–10.3)
Chloride: 106 mmol/L (ref 101–111)
GFR calc Af Amer: >60 mL/min (ref >60)
Glucose, Bld: 93 mg/dL (ref 65–99)
POTASSIUM: 3.9 mmol/L (ref 3.5–5.1)
Sodium: 138 mmol/L (ref 135–145)
TOTAL PROTEIN: 7.1 g/dL (ref 6.5–8.1)

## 2015-10-31 MED ORDER — OXYCODONE-ACETAMINOPHEN 7.5-325 MG PO TABS: 2.0000 | ORAL_TABLET | ORAL | 0 refills | Status: DC | PRN

## 2015-10-31 NOTE — Progress Notes (Signed)
S/p lap chole, IOC w no filling of duodenum We have done U/S showing no bil dilation, she feels better, taking PO, no evidence of bil obstruction Afebrile Only pain is in periumbilical incision Labs reviewed and TB and Alk phos is nml. AST slightly elevated but actually better than 1 week ago  PE NAD Abd: soft, mild appropiate incisional tenderness on periumbilical incision  A/P doing well no clinical evidence of biliary obstruction We will f/e 10 days if any more sxs will need MRP Doubt that she will come to this D./W the pt in detail and she understands Apparently she lost som of her pain medicines, counseling provided about my concern. We will only provide 20 pills and this will be my last and only narcotic prescription for her. She understands

## 2015-10-31 NOTE — Patient Instructions (Addendum)
Please call our office with any questions or concerns.  Please do not submerge in a tub, hot tub, or pool until incisions are completely sealed.  Use sun block to incision area over the next year if this area will be exposed to sun. This helps decrease scarring.  You may now resume your normal activities. Listen to your body when lifting, if you have pain when lifting, stop and then try again in a few days.  If you develop redness, drainage, or pain at incision sites- call our office immediately and speak with a nurse.   Directions to Medical Mall: When leaving our office, go right. Go all of the way down to the very end of the hallway. You will have a purple wall in front of you. You will now have a tunnel to the hospital on your left hand side. Go through this tunnel and the elevators will be on your left. Go down to the 1st floor and take a slight left. The very first desk on the right hand side is the registration desk.

## 2015-10-31 NOTE — Telephone Encounter (Signed)
Called patient and had to leave a message to call me back.  Patient just needs to know that her lab results were normal.

## 2015-11-03 NOTE — Telephone Encounter (Signed)
Patient called in regards to her lab results. I read Cassidy Mathis's telephone note and let her know that her labs are normal.   She had a question. She was told that she might need a procedure because she may have a gallstone. This was only mentioned to her once in her follow up appointment and she wants to know if she still needs the procedure because she hasn't heard any news about it.

## 2015-11-11 ENCOUNTER — Telehealth: Payer: Self-pay

## 2015-11-11 ENCOUNTER — Telehealth: Payer: Self-pay | Admitting: Surgery

## 2015-11-11 NOTE — Telephone Encounter (Signed)
Patient's disability form from [employer] was filled out and faxed.

## 2015-11-11 NOTE — Telephone Encounter (Signed)
Patient would like to talk to you regarding her disability papers

## 2015-11-12 NOTE — Telephone Encounter (Signed)
Called patient back and she stated that her disability company called her on 11/10/2015 to let her know that they had not received her disability paperwork. I told her that I had received confirmation of them receiving it on 11/11/2015. I asked patient to give them a call to confirm and if they had not received it, then to call me back so I could re-fax it. Patient understood and had no further questions.

## 2015-11-21 ENCOUNTER — Telehealth: Payer: Self-pay

## 2015-11-21 NOTE — Telephone Encounter (Signed)
Patient's Return to work certification was filled out and faxed to [employer].

## 2015-12-01 ENCOUNTER — Telehealth: Payer: Self-pay | Admitting: *Deleted

## 2015-12-01 ENCOUNTER — Telehealth: Payer: Self-pay

## 2015-12-01 NOTE — Telephone Encounter (Signed)
Called patient to let her know that I had faxed her disability form to [employer] as she requested. Patient had no further questions.

## 2015-12-01 NOTE — Telephone Encounter (Signed)
Patient's disability forms were faxed to the number that she provided. These documents were faxed previously on 11/21/2015.

## 2015-12-01 NOTE — Telephone Encounter (Signed)
Patient called and states she needs her Disability paperwork faxed to [employer] before she can return back to work. Patient starts back working tomorrow. The Human Resource Rep leaves at 3pm today. The fax number is [phone]. Please advise . Thank you

## 2016-01-26 DIAGNOSIS — J189 Pneumonia, unspecified organism: Secondary | ICD-10-CM

## 2016-01-26 HISTORY — DX: Pneumonia, unspecified organism: J18.9

## 2016-03-10 ENCOUNTER — Emergency Department: Payer: Medicaid Other

## 2016-03-10 ENCOUNTER — Encounter: Payer: Self-pay | Admitting: Medical Oncology

## 2016-03-10 ENCOUNTER — Emergency Department
Admission: EM | Admit: 2016-03-10 | Discharge: 2016-03-10 | Disposition: A | Discharge status: Home / Self Care | Payer: Medicaid Other | Attending: Emergency Medicine | Admitting: Emergency Medicine

## 2016-03-10 DIAGNOSIS — N189 Chronic kidney disease, unspecified: Secondary | ICD-10-CM | POA: Insufficient documentation

## 2016-03-10 DIAGNOSIS — F17210 Nicotine dependence, cigarettes, uncomplicated: Secondary | ICD-10-CM | POA: Diagnosis not present

## 2016-03-10 DIAGNOSIS — Z79899 Other long term (current) drug therapy: Secondary | ICD-10-CM | POA: Insufficient documentation

## 2016-03-10 DIAGNOSIS — M25571 Pain in right ankle and joints of right foot: Secondary | ICD-10-CM | POA: Diagnosis not present

## 2016-03-10 DIAGNOSIS — I129 Hypertensive chronic kidney disease with stage 1 through stage 4 chronic kidney disease, or unspecified chronic kidney disease: Secondary | ICD-10-CM | POA: Diagnosis not present

## 2016-03-10 MED ORDER — DICLOFENAC SODIUM 75 MG PO TBEC: 75.0000 mg | DELAYED_RELEASE_TABLET | Freq: Two times a day (BID) | ORAL | 0 refills | Status: DC

## 2016-03-10 NOTE — ED Provider Notes (Signed)
[facility] Emergency Department Provider Note  ____________________________________________   First MD Initiated Contact with Patient 03/10/16 1155     (approximate)  I have reviewed the triage vital signs and the nursing notes.   HISTORY  Chief Complaint Ankle Pain    HPI Cassidy Mathis is a 41 y.o. female is here complaining of right ankle pain for 2 weeks. Patient states that she is unaware of any recent injury. She states in the past she has had a fracture. Patient has been taking over-the-counter ibuprofen 2 tablets in (400 mg) each morning and 1 tablet in the evening. She states that this has not given her any relief. She denies any previous GI problems. Patient has not been seen by her primary care doctor about her ankle pain. He currently rates his pain as 9/10.   Past Medical History:  Diagnosis Date  . Allergy    seasonal  . Anemia   . Chronic kidney disease    KIDNEY STONES CURRENTLY  . Depression   . GERD (gastroesophageal reflux disease)    NO MEDS  . Headache    MIGRAINES  . Hypertension    PT STATES SHE IS SUPPOSED TO BE TAKING LISINOPRIL-HCTZ BUT HAS BEEN OUT "FOR A WHILE" NEEDS TO GET ANOTHER PRESCRIPTION FROM HER PCP  . Pilonidal cyst    TOOK ANTIBIOTIC THIS MONTH AND IT IS RESOLVED    Patient Active Problem List   Diagnosis Date Noted  . 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 Incarcerated epigastric hernia     Past Surgical History:  Procedure Laterality Date  . CHOLECYSTECTOMY  10/21/2015   Procedure: LAPAROSCOPIC CHOLECYSTECTOMY WITH INTRAOPERATIVE CHOLANGIOGRAM;  Surgeon: Jules Husbands, MD;  Location: [facility] ORS;  Service: General;;  . CYST EXCISION Left 1998   wrist  . CYST EXCISION  1998   tongue  . EPIGASTRIC HERNIA REPAIR N/A 04/18/2015   Procedure: HERNIA REPAIR EPIGASTRIC ADULT;  Surgeon: Jules Husbands, MD;  Location: [facility] ORS;  Service: General;  Laterality: N/A;  . TUBAL  LIGATION      Prior to Admission medications   Medication Sig Start Date End Date Taking? Authorizing Provider  diclofenac (VOLTAREN) 75 MG EC tablet Take 1 tablet (75 mg total) by mouth 2 (two) times daily. 03/10/16   Johnn Hai, PA-C  lisinopril-hydrochlorothiazide (PRINZIDE,ZESTORETIC) 20-25 MG tablet Take 1 tablet by mouth daily. 10/13/15   Historical Provider, MD    Allergies Patient has no known allergies.  Family History  Problem Relation Age of Onset  . Hypertension Mother   . Kidney Stones Mother   . Gallstones Mother   . Hypertension Father   . Gout Father   . Cancer Paternal Uncle     lung  . Cancer Maternal Grandmother     not sure what type of cancer  . Cancer Maternal Grandfather     not sure what type of cancer  . Cancer Paternal Grandmother     not sure what type of cancer  . Cancer Paternal Grandfather     not sure what type of cancer    Social History Social History  Substance Use Topics  . Smoking status: Current Every Day Smoker    Packs/day: 0.50    Years: 12.00    Types: Cigarettes  . Smokeless tobacco: Never Used  . Alcohol use 0.0 oz/week     Comment: Barely    Review of Systems Constitutional: No fever/chills Cardiovascular: Denies  chest pain. Respiratory: Denies shortness of breath. Gastrointestinal:  No nausea, no vomiting.   Musculoskeletal: Positive right ankle pain. Skin: Negative for rash. Neurological: Negative for headaches, focal weakness or numbness.  10-point ROS otherwise negative.  ____________________________________________   PHYSICAL EXAM:  VITAL SIGNS: ED Triage Vitals  Enc Vitals Group     BP 03/10/16 1052 (!) 149/92     Pulse Rate 03/10/16 1052 80     Resp 03/10/16 1052 16     Temp 03/10/16 1052 98.3 F (36.8 C)     Temp Source 03/10/16 1052 Oral     SpO2 03/10/16 1052 100 %     Weight 03/10/16 1050 179 lb (81.2 kg)     Height --      Head Circumference --      Peak Flow --      Pain Score  03/10/16 1050 9     Pain Loc --      Pain Edu? --      Excl. in [place]? --     Constitutional: Alert and oriented. Well appearing and in no acute distress. Eyes: Conjunctivae are normal. PERRL. EOMI. Head: Atraumatic. Nose: No congestion/rhinnorhea. Neck: No stridor.   Cardiovascular: Normal rate, regular rhythm. Grossly normal heart sounds.  Good peripheral circulation. Respiratory: Normal respiratory effort.  No retractions. Lungs CTAB. Musculoskeletal: Right ankle minimal soft tissue swelling present. Range of motion is without restriction. There is some minimal tenderness on palpation lateral malleolus without gross deformity. There is no erythema or ecchymosis noted. Pulses present. Neurologic:  Normal speech and language. No gross focal neurologic deficits are appreciated. No gait instability. Skin:  Skin is warm, dry and intact. As above. Psychiatric: Mood and affect are normal. Speech and behavior are normal.  ____________________________________________   LABS (all labs ordered are listed, but only abnormal results are displayed)  Labs Reviewed - No data to display  RADIOLOGY Right ankle x-ray per radiologist is negative. I, Johnn Hai, personally viewed and evaluated these images (plain radiographs) as part of my medical decision making, as well as reviewing the written report by the radiologist.  ____________________________________________   PROCEDURES  Procedure(s) performed: None  Procedures  Critical Care performed: No  ____________________________________________   INITIAL IMPRESSION / ASSESSMENT AND PLAN / ED COURSE  Pertinent labs & imaging results that were available during my care of the patient were reviewed by me and considered in my medical decision making (see chart for details).  Patient was discharged with instructions follow-up with Dr. Roland Rack if any continued problems with her ankle. She is given a prescription for Voltaren 75 mg twice a  day with food. No acute changes were seen on her x-ray and patient was made aware.      ____________________________________________   FINAL CLINICAL IMPRESSION(S) / ED DIAGNOSES  Final diagnoses:  Acute right ankle pain      NEW MEDICATIONS STARTED DURING THIS VISIT:  Discharge Medication List as of 03/10/2016  1:57 PM    START taking these medications   Details  diclofenac (VOLTAREN) 75 MG EC tablet Take 1 tablet (75 mg total) by mouth 2 (two) times daily., Starting Wed 03/10/2016, Print         Note:  This document was prepared using Dragon voice recognition software and may include unintentional dictation errors.    Johnn Hai, PA-C 03/10/16 1508    Schuyler Amor, MD 03/10/16 [phone]

## 2016-03-10 NOTE — Discharge Instructions (Signed)
Follow-up with your primary care doctor or Dr. Roland Rack if any continued positives with her ankle. Begin taking Voltaren 75 mg twice a day with food. Follow-up with your primary care doctor if this medicine relieved your pain and you are in need of any continued medication.

## 2016-03-10 NOTE — ED Triage Notes (Signed)
Pt c/o rt ankle pain for about 2 weeks, pt reports she broke her ankle 1 year ago, denies new injury.

## 2016-12-15 ENCOUNTER — Emergency Department: Payer: Medicaid Other

## 2016-12-15 ENCOUNTER — Other Ambulatory Visit: Payer: Self-pay

## 2016-12-15 ENCOUNTER — Inpatient Hospital Stay
Admission: EM | Admit: 2016-12-15 | Discharge: 2016-12-17 | DRG: 444 | Disposition: A | Discharge status: Home / Self Care | Payer: Medicaid Other | Attending: Internal Medicine | Admitting: Internal Medicine

## 2016-12-15 ENCOUNTER — Encounter: Payer: Self-pay | Admitting: Emergency Medicine

## 2016-12-15 DIAGNOSIS — R740 Nonspecific elevation of levels of transaminase and lactic acid dehydrogenase [LDH]: Secondary | ICD-10-CM | POA: Diagnosis present

## 2016-12-15 DIAGNOSIS — Z791 Long term (current) use of non-steroidal anti-inflammatories (NSAID): Secondary | ICD-10-CM

## 2016-12-15 DIAGNOSIS — G8929 Other chronic pain: Secondary | ICD-10-CM | POA: Diagnosis present

## 2016-12-15 DIAGNOSIS — Z87442 Personal history of urinary calculi: Secondary | ICD-10-CM

## 2016-12-15 DIAGNOSIS — E876 Hypokalemia: Secondary | ICD-10-CM | POA: Diagnosis present

## 2016-12-15 DIAGNOSIS — K8050 Calculus of bile duct without cholangitis or cholecystitis without obstruction: Principal | ICD-10-CM | POA: Diagnosis present

## 2016-12-15 DIAGNOSIS — I10 Essential (primary) hypertension: Secondary | ICD-10-CM | POA: Diagnosis present

## 2016-12-15 DIAGNOSIS — Z9049 Acquired absence of other specified parts of digestive tract: Secondary | ICD-10-CM

## 2016-12-15 DIAGNOSIS — K8500 Idiopathic acute pancreatitis without necrosis or infection: Secondary | ICD-10-CM

## 2016-12-15 DIAGNOSIS — F410 Panic disorder [episodic paroxysmal anxiety] without agoraphobia: Secondary | ICD-10-CM | POA: Diagnosis present

## 2016-12-15 DIAGNOSIS — F17210 Nicotine dependence, cigarettes, uncomplicated: Secondary | ICD-10-CM | POA: Diagnosis present

## 2016-12-15 DIAGNOSIS — K859 Acute pancreatitis without necrosis or infection, unspecified: Secondary | ICD-10-CM | POA: Diagnosis present

## 2016-12-15 DIAGNOSIS — Z79899 Other long term (current) drug therapy: Secondary | ICD-10-CM

## 2016-12-15 DIAGNOSIS — K219 Gastro-esophageal reflux disease without esophagitis: Secondary | ICD-10-CM | POA: Diagnosis present

## 2016-12-15 DIAGNOSIS — R12 Heartburn: Secondary | ICD-10-CM | POA: Diagnosis present

## 2016-12-15 DIAGNOSIS — Z801 Family history of malignant neoplasm of trachea, bronchus and lung: Secondary | ICD-10-CM

## 2016-12-15 DIAGNOSIS — Z8249 Family history of ischemic heart disease and other diseases of the circulatory system: Secondary | ICD-10-CM

## 2016-12-15 LAB — COMPREHENSIVE METABOLIC PANEL
ALT: 410 U/L — AB (ref 14–54)
AST: 335 U/L — AB (ref 15–41)
Albumin: 4 g/dL (ref 3.5–5.0)
Alkaline Phosphatase: 251 U/L — ABNORMAL HIGH (ref 38–126)
Anion gap: 8 (ref 5–15)
BILIRUBIN TOTAL: 1.5 mg/dL — AB (ref 0.3–1.2)
BUN: 12 mg/dL (ref 6–20)
CO2: 23 mmol/L (ref 22–32)
CREATININE: 0.81 mg/dL (ref 0.44–1.00)
Calcium: 9 mg/dL (ref 8.9–10.3)
Chloride: 107 mmol/L (ref 101–111)
GFR calc Af Amer: >60 mL/min (ref >60)
Glucose, Bld: 84 mg/dL (ref 65–99)
Potassium: 3.4 mmol/L — ABNORMAL LOW (ref 3.5–5.1)
Sodium: 138 mmol/L (ref 135–145)
TOTAL PROTEIN: 7.4 g/dL (ref 6.5–8.1)

## 2016-12-15 LAB — URINALYSIS, COMPLETE (UACMP) WITH MICROSCOPIC
BILIRUBIN URINE: NEGATIVE
Bacteria, UA: NONE SEEN
Glucose, UA: NEGATIVE mg/dL
Ketones, ur: 80 mg/dL — AB
NITRITE: NEGATIVE
PH: 5 (ref 5.0–8.0)
Protein, ur: 100 mg/dL — AB
SPECIFIC GRAVITY, URINE: 1.034 — AB (ref 1.005–1.030)

## 2016-12-15 LAB — CBC
HCT: 41.8 % (ref 35.0–47.0)
Hemoglobin: 14.4 g/dL (ref 12.0–16.0)
MCH: 30.6 pg (ref 26.0–34.0)
MCHC: 34.4 g/dL (ref 32.0–36.0)
MCV: 89 fL (ref 80.0–100.0)
PLATELETS: 387 10*3/uL (ref 150–440)
RBC: 4.69 MIL/uL (ref 3.80–5.20)
RDW: 13.4 % (ref 11.5–14.5)
WBC: 13.3 10*3/uL — AB (ref 3.6–11.0)

## 2016-12-15 LAB — PREGNANCY, URINE: Preg Test, Ur: NEGATIVE

## 2016-12-15 LAB — LIPASE, BLOOD: Lipase: 1157 U/L — ABNORMAL HIGH (ref 11–51)

## 2016-12-15 LAB — POCT PREGNANCY, URINE: PREG TEST UR: NEGATIVE

## 2016-12-15 MED ORDER — MORPHINE SULFATE (PF) 4 MG/ML IV SOLN
4.0000 mg | Freq: Once | INTRAVENOUS | Status: AC
  Administered 2016-12-15: 4 mg via INTRAVENOUS
  Filled 2016-12-15: qty 1

## 2016-12-15 MED ORDER — ONDANSETRON HCL 4 MG/2ML IJ SOLN
4.0000 mg | Freq: Once | INTRAMUSCULAR | Status: AC
  Administered 2016-12-15: 4 mg via INTRAVENOUS
  Filled 2016-12-15: qty 2

## 2016-12-15 MED ORDER — IOPAMIDOL (ISOVUE-300) INJECTION 61%
100.0000 mL | Freq: Once | INTRAVENOUS | Status: AC | PRN
  Administered 2016-12-15: 100 mL via INTRAVENOUS

## 2016-12-15 MED ORDER — GI COCKTAIL ~~LOC~~
30.0000 mL | Freq: Once | ORAL | Status: AC
  Administered 2016-12-15: 30 mL via ORAL
  Filled 2016-12-15: qty 30

## 2016-12-15 MED ORDER — IOPAMIDOL (ISOVUE-300) INJECTION 61%
30.0000 mL | Freq: Once | INTRAVENOUS | Status: AC | PRN
  Administered 2016-12-15: 30 mL via ORAL

## 2016-12-15 MED ORDER — HYDROMORPHONE HCL 1 MG/ML IJ SOLN
0.5000 mg | Freq: Once | INTRAMUSCULAR | Status: AC
  Administered 2016-12-15: 0.5 mg via INTRAVENOUS
  Filled 2016-12-15: qty 1

## 2016-12-15 MED ORDER — SODIUM CHLORIDE 0.9 % IV SOLN
Freq: Once | INTRAVENOUS | Status: AC
  Administered 2016-12-15: 19:00:00 via INTRAVENOUS

## 2016-12-15 NOTE — ED Notes (Signed)
Pt requesting ice chips. MD notified and approved ice chips for patient.

## 2016-12-15 NOTE — ED Provider Notes (Addendum)
[facility] Emergency Department Provider Note   ____________________________________________   None    (approximate)  I have reviewed the triage vital signs and the nursing notes.   HISTORY  Chief Complaint Abdominal Pain   HPI Cassidy Mathis is a 41 y.o. female Who reports sudden onset of severe epigastric pain last night. She reports sometimes she gets chest pressure that her doctors told her to panic attacks but this does not feel the same at all. The pain is sharp and severe worse with deep breathing movement or palpation. She had some nausea. He does not have any diarrhea. She has not had this kind of pain before.patient has had an epigastric hernia according the old records.   Past Medical History:  Diagnosis Date  . Allergy    seasonal  . Anemia   . Chronic kidney disease    KIDNEY STONES CURRENTLY  . Depression   . GERD (gastroesophageal reflux disease)    NO MEDS  . Headache    MIGRAINES  . Hypertension    PT STATES SHE IS SUPPOSED TO BE TAKING LISINOPRIL-HCTZ BUT HAS BEEN OUT "FOR A WHILE" NEEDS TO GET ANOTHER PRESCRIPTION FROM HER PCP  . Pilonidal cyst    TOOK ANTIBIOTIC THIS MONTH AND IT IS RESOLVED    Patient Active Problem List   Diagnosis Date Noted  . 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 Incarcerated epigastric hernia     Past Surgical History:  Procedure Laterality Date  . CHOLECYSTECTOMY  10/21/2015   Procedure: LAPAROSCOPIC CHOLECYSTECTOMY WITH INTRAOPERATIVE CHOLANGIOGRAM;  Surgeon: Jules Husbands, MD;  Location: [facility] ORS;  Service: General;;  . CYST EXCISION Left 1998   wrist  . CYST EXCISION  1998   tongue  . EPIGASTRIC HERNIA REPAIR N/A 04/18/2015   Procedure: HERNIA REPAIR EPIGASTRIC ADULT;  Surgeon: Jules Husbands, MD;  Location: [facility] ORS;  Service: General;  Laterality: N/A;  . TUBAL LIGATION      Prior to Admission medications   Medication Sig Start Date End Date  Taking? Authorizing Provider  ibuprofen (ADVIL,MOTRIN) 200 MG tablet Take 200-400 mg by mouth every 6 (six) hours as needed.   Yes [provider]  lisinopril-hydrochlorothiazide (PRINZIDE,ZESTORETIC) 20-25 MG tablet Take 1 tablet by mouth daily. 10/13/15  Yes [provider]  diclofenac (VOLTAREN) 75 MG EC tablet Take 1 tablet (75 mg total) by mouth 2 (two) times daily. Patient not taking: Reported on 12/15/2016 03/10/16   Johnn Hai, PA-C    Allergies Patient has no known allergies.  Family History  Problem Relation Age of Onset  . Hypertension Mother   . Kidney Stones Mother   . Gallstones Mother   . Hypertension Father   . Gout Father   . Cancer Paternal Uncle        lung  . Cancer Maternal Grandmother        not sure what type of cancer  . Cancer Maternal Grandfather        not sure what type of cancer  . Cancer Paternal Grandmother        not sure what type of cancer  . Cancer Paternal Grandfather        not sure what type of cancer    Social History Social History   Tobacco Use  . Smoking status: Current Every Day Smoker    Packs/day: 0.50    Years: 12.00    Pack years: 6.00  Types: Cigarettes  . Smokeless tobacco: Never Used  Substance Use Topics  . Alcohol use: Yes    Alcohol/week: 0.0 oz    Comment: Barely  . Drug use: Yes    Types: Marijuana    Comment: "not often"    Review of Systems  Constitutional: No fever/chills Eyes: No visual changes. ENT: No sore throat. Cardiovascular: Denies chest pain. Respiratory: see history of present illness Genitourinary: Negative for dysuria. Musculoskeletal: Negative for back pain. Skin: Negative for rash. Neurological: Negative for headaches, focal weakness    ____________________________________________   PHYSICAL EXAM:  VITAL SIGNS: ED Triage Vitals  Enc Vitals Group     BP 12/15/16 1738 (!) 149/104     Pulse Rate 12/15/16 1738 (!) 121     Resp 12/15/16 1738 18     Temp  12/15/16 1738 99.2 F (37.3 C)     Temp Source 12/15/16 1738 Oral     SpO2 12/15/16 1738 99 %     Weight 12/15/16 1739 180 lb (81.6 kg)     Height 12/15/16 1739 5\' 5"  (1.651 m)     Head Circumference --      Peak Flow --      Pain Score 12/15/16 1737 10     Pain Loc --      Pain Edu? --      Excl. in [place]? --     Constitutional: Alert and oriented. Well appearing appears to be in pain though Eyes: Conjunctivae are normal. . Head: Atraumatic. Nose: No congestion/rhinnorhea. Mouth/Throat: Mucous membranes are moist.  Oropharynx non-erythematous. Neck: No stridor.  Cardiovascular: Normal rate, regular rhythm. Grossly normal heart sounds.  Good peripheral circulation. Respiratory: Normal respiratory effort.  No retractions. Lungs CTAB. Gastrointestinal: Soft tender to palpation percussion in the epigastric area and slightly in the right upper quadrant No distention. No abdominal bruits. No CVA tenderness. Musculoskeletal: No lower extremity tenderness nor edema.  No joint effusions. Neurologic:  Normal speech and language. No gross focal neurologic deficits are appreciated. Skin:  Skin is warm, dry and intact. No rash noted. Psychiatric: Mood and affect are normal. Speech and behavior are normal.  ____________________________________________   LABS (all labs ordered are listed, but only abnormal results are displayed)  Labs Reviewed  LIPASE, BLOOD - Abnormal; Notable for the following components:      Result Value   Lipase 1,157 (*)    All other components within normal limits  COMPREHENSIVE METABOLIC PANEL - Abnormal; Notable for the following components:   Potassium 3.4 (*)    AST 335 (*)    ALT 410 (*)    Alkaline Phosphatase 251 (*)    Total Bilirubin 1.5 (*)    All other components within normal limits  CBC - Abnormal; Notable for the following components:   WBC 13.3 (*)    All other components within normal limits  URINALYSIS, COMPLETE (UACMP) WITH MICROSCOPIC -  Abnormal; Notable for the following components:   Color, Urine AMBER (*)    APPearance CLEAR (*)    Specific Gravity, Urine 1.034 (*)    Hgb urine dipstick MODERATE (*)    Ketones, ur 80 (*)    Protein, ur 100 (*)    Leukocytes, UA TRACE (*)    Squamous Epithelial / LPF 6-30 (*)    All other components within normal limits  PREGNANCY, URINE  POCT PREGNANCY, URINE   ____________________________________________  EKG   ____________________________________________  RADIOLOGY  CT shows acute edematous pancreatitis. Her some dilatation of  the bile duct. Course she is postcholecystectomy. ____________________________________________   PROCEDURES  Procedure(s) performed:   Procedures  Critical Care performed:   ____________________________________________   INITIAL IMPRESSION / ASSESSMENT AND PLAN / ED COURSE   patient's pain is improving with medication. We will have to see how she does and decide if she needs an ERCP. GI is on duty.      ____________________________________________   FINAL CLINICAL IMPRESSION(S) / ED DIAGNOSES  Final diagnoses:  Idiopathic acute pancreatitis without infection or necrosis     ED Discharge Orders    None       Note:  This document was prepared using Dragon voice recognition software and may include unintentional dictation errors.    Nena Polio, MD 12/15/16 2022    Nena Polio, MD 12/15/16 2023 discussed with GI on-call at [facility]. He feels that the bilirubin is not high enough to be obstructive. He thinks that the patient should be able to wait even up until Friday to get an ERCP if need be. He recommends just doing an MRCP for now.hospitalist will agree to this.   Nena Polio, MD 12/15/16 2118

## 2016-12-15 NOTE — ED Notes (Signed)
Lab called to say they have to dilute the alt and ast and they would post the results when done - as of now they are reading the the 200's and 300's respectively

## 2016-12-15 NOTE — ED Notes (Signed)
Pt here for abd pain and nausea - hx of tubal ligation. Pt did not take her bp meds today. Lab work sent from triage. Pt medicated with gi cocktail per dr order - iv inserted for ct. Pt medicated with nausea medication and drinking her contrast.

## 2016-12-15 NOTE — ED Triage Notes (Signed)
Pt presents with epigastric pain since last night. States she did not eat dinner. States she has had n/v, denies diarrhea. Pt tearful during triage.

## 2016-12-15 NOTE — ED Notes (Addendum)
Pt had large emesis

## 2016-12-15 NOTE — ED Notes (Signed)
Pt requesting something to drink, and states her mouth is extremely dry. MD requesting pt remain NPO at present.

## 2016-12-15 NOTE — ED Notes (Signed)
Patient transported to CT 

## 2016-12-16 ENCOUNTER — Other Ambulatory Visit: Payer: Self-pay

## 2016-12-16 ENCOUNTER — Inpatient Hospital Stay: Payer: Medicaid Other

## 2016-12-16 DIAGNOSIS — K8500 Idiopathic acute pancreatitis without necrosis or infection: Secondary | ICD-10-CM | POA: Diagnosis present

## 2016-12-16 DIAGNOSIS — K219 Gastro-esophageal reflux disease without esophagitis: Secondary | ICD-10-CM | POA: Diagnosis present

## 2016-12-16 DIAGNOSIS — G8929 Other chronic pain: Secondary | ICD-10-CM | POA: Diagnosis present

## 2016-12-16 DIAGNOSIS — Z791 Long term (current) use of non-steroidal anti-inflammatories (NSAID): Secondary | ICD-10-CM | POA: Diagnosis not present

## 2016-12-16 DIAGNOSIS — Z9049 Acquired absence of other specified parts of digestive tract: Secondary | ICD-10-CM | POA: Diagnosis not present

## 2016-12-16 DIAGNOSIS — Z8249 Family history of ischemic heart disease and other diseases of the circulatory system: Secondary | ICD-10-CM | POA: Diagnosis not present

## 2016-12-16 DIAGNOSIS — Z87442 Personal history of urinary calculi: Secondary | ICD-10-CM | POA: Diagnosis not present

## 2016-12-16 DIAGNOSIS — F410 Panic disorder [episodic paroxysmal anxiety] without agoraphobia: Secondary | ICD-10-CM | POA: Diagnosis present

## 2016-12-16 DIAGNOSIS — F17210 Nicotine dependence, cigarettes, uncomplicated: Secondary | ICD-10-CM | POA: Diagnosis present

## 2016-12-16 DIAGNOSIS — Z79899 Other long term (current) drug therapy: Secondary | ICD-10-CM | POA: Diagnosis not present

## 2016-12-16 DIAGNOSIS — K851 Biliary acute pancreatitis without necrosis or infection: Secondary | ICD-10-CM | POA: Diagnosis not present

## 2016-12-16 DIAGNOSIS — K8050 Calculus of bile duct without cholangitis or cholecystitis without obstruction: Secondary | ICD-10-CM | POA: Diagnosis present

## 2016-12-16 DIAGNOSIS — E876 Hypokalemia: Secondary | ICD-10-CM | POA: Diagnosis present

## 2016-12-16 DIAGNOSIS — I10 Essential (primary) hypertension: Secondary | ICD-10-CM | POA: Diagnosis present

## 2016-12-16 DIAGNOSIS — R740 Nonspecific elevation of levels of transaminase and lactic acid dehydrogenase [LDH]: Secondary | ICD-10-CM | POA: Diagnosis present

## 2016-12-16 DIAGNOSIS — Z801 Family history of malignant neoplasm of trachea, bronchus and lung: Secondary | ICD-10-CM | POA: Diagnosis not present

## 2016-12-16 DIAGNOSIS — K859 Acute pancreatitis without necrosis or infection, unspecified: Secondary | ICD-10-CM | POA: Diagnosis present

## 2016-12-16 DIAGNOSIS — R12 Heartburn: Secondary | ICD-10-CM | POA: Diagnosis present

## 2016-12-16 LAB — CBC
HEMATOCRIT: 36.1 % (ref 35.0–47.0)
HEMOGLOBIN: 12.2 g/dL (ref 12.0–16.0)
MCH: 30.2 pg (ref 26.0–34.0)
MCHC: 33.9 g/dL (ref 32.0–36.0)
MCV: 89 fL (ref 80.0–100.0)
Platelets: 326 10*3/uL (ref 150–440)
RBC: 4.05 MIL/uL (ref 3.80–5.20)
RDW: 13.6 % (ref 11.5–14.5)
WBC: 15.5 10*3/uL — AB (ref 3.6–11.0)

## 2016-12-16 LAB — HEPATIC FUNCTION PANEL
ALBUMIN: 3.2 g/dL — AB (ref 3.5–5.0)
ALK PHOS: 182 U/L — AB (ref 38–126)
ALT: 204 U/L — ABNORMAL HIGH (ref 14–54)
AST: 102 U/L — AB (ref 15–41)
BILIRUBIN TOTAL: 1.1 mg/dL (ref 0.3–1.2)
Bilirubin, Direct: 0.2 mg/dL (ref 0.1–0.5)
Indirect Bilirubin: 0.9 mg/dL (ref 0.3–0.9)
TOTAL PROTEIN: 6.4 g/dL — AB (ref 6.5–8.1)

## 2016-12-16 LAB — LIPID PANEL
Cholesterol: 133 mg/dL (ref 0–200)
HDL: 68 mg/dL (ref >40)
LDL Cholesterol: 59 mg/dL (ref 0–99)
Total CHOL/HDL Ratio: 2 RATIO
Triglycerides: 32 mg/dL (ref <150)
VLDL: 6 mg/dL (ref 0–40)

## 2016-12-16 LAB — TSH: TSH: 0.683 u[IU]/mL (ref 0.350–4.500)

## 2016-12-16 LAB — HEMOGLOBIN A1C
Hgb A1c MFr Bld: 5.5 % (ref 4.8–5.6)
Mean Plasma Glucose: 111.15 mg/dL

## 2016-12-16 MED ORDER — PROCHLORPERAZINE EDISYLATE 5 MG/ML IJ SOLN
10.0000 mg | Freq: Four times a day (QID) | INTRAMUSCULAR | Status: DC | PRN
  Filled 2016-12-16: qty 2

## 2016-12-16 MED ORDER — ONDANSETRON HCL 4 MG/2ML IJ SOLN
4.0000 mg | Freq: Four times a day (QID) | INTRAMUSCULAR | Status: DC | PRN
  Administered 2016-12-16 – 2016-12-17 (×3): 4 mg via INTRAVENOUS
  Filled 2016-12-16 (×3): qty 2

## 2016-12-16 MED ORDER — MORPHINE SULFATE (PF) 2 MG/ML IV SOLN
2.0000 mg | INTRAVENOUS | Status: DC | PRN
  Administered 2016-12-16 (×2): 2 mg via INTRAVENOUS
  Filled 2016-12-16 (×2): qty 1

## 2016-12-16 MED ORDER — DOCUSATE SODIUM 100 MG PO CAPS
100.0000 mg | ORAL_CAPSULE | Freq: Two times a day (BID) | ORAL | Status: DC
  Administered 2016-12-16 – 2016-12-17 (×3): 100 mg via ORAL
  Filled 2016-12-16 (×3): qty 1

## 2016-12-16 MED ORDER — LACTATED RINGERS IV BOLUS (SEPSIS)
500.0000 mL | Freq: Once | INTRAVENOUS | Status: AC
Start: 2016-12-16 — End: 2016-12-16
  Administered 2016-12-16: 500 mL via INTRAVENOUS

## 2016-12-16 MED ORDER — ENOXAPARIN SODIUM 40 MG/0.4ML ~~LOC~~ SOLN: 40.0000 mg | SUBCUTANEOUS | Status: DC

## 2016-12-16 MED ORDER — ONDANSETRON HCL 4 MG PO TABS: 4.0000 mg | ORAL_TABLET | Freq: Four times a day (QID) | ORAL | Status: DC | PRN

## 2016-12-16 MED ORDER — ACETAMINOPHEN 650 MG RE SUPP: 650.0000 mg | Freq: Four times a day (QID) | RECTAL | Status: DC | PRN

## 2016-12-16 MED ORDER — LORAZEPAM 2 MG/ML IJ SOLN: 1.0000 mg | Freq: Once | INTRAMUSCULAR | Status: DC

## 2016-12-16 MED ORDER — PANTOPRAZOLE SODIUM 40 MG IV SOLR
40.0000 mg | Freq: Two times a day (BID) | INTRAVENOUS | Status: DC
  Administered 2016-12-16 – 2016-12-17 (×4): 40 mg via INTRAVENOUS
  Filled 2016-12-16 (×4): qty 40

## 2016-12-16 MED ORDER — POTASSIUM CHLORIDE 10 MEQ/100ML IV SOLN: 10.0000 meq | INTRAVENOUS | Status: DC

## 2016-12-16 MED ORDER — ACETAMINOPHEN 325 MG PO TABS
650.0000 mg | ORAL_TABLET | Freq: Four times a day (QID) | ORAL | Status: DC | PRN
  Administered 2016-12-16: 650 mg via ORAL
  Filled 2016-12-16: qty 2

## 2016-12-16 MED ORDER — POTASSIUM CHLORIDE IN NACL 40-0.9 MEQ/L-% IV SOLN
INTRAVENOUS | Status: DC
  Administered 2016-12-16 – 2016-12-17 (×3): 125 mL/h via INTRAVENOUS
  Filled 2016-12-16 (×7): qty 1000

## 2016-12-16 MED ORDER — LORAZEPAM 2 MG/ML IJ SOLN
1.0000 mg | Freq: Once | INTRAMUSCULAR | Status: AC
  Administered 2016-12-16: 1 mg via INTRAVENOUS
  Filled 2016-12-16: qty 1

## 2016-12-16 MED ORDER — DOCUSATE SODIUM 100 MG PO CAPS: 100.0000 mg | ORAL_CAPSULE | Freq: Two times a day (BID) | ORAL | Status: DC

## 2016-12-16 MED ORDER — MORPHINE SULFATE (PF) 2 MG/ML IV SOLN
2.0000 mg | Freq: Four times a day (QID) | INTRAVENOUS | Status: DC | PRN
  Administered 2016-12-17 (×2): 2 mg via INTRAVENOUS
  Filled 2016-12-16 (×2): qty 1

## 2016-12-16 MED ORDER — ENOXAPARIN SODIUM 40 MG/0.4ML ~~LOC~~ SOLN
40.0000 mg | SUBCUTANEOUS | Status: DC
  Administered 2016-12-16 – 2016-12-17 (×2): 40 mg via SUBCUTANEOUS
  Filled 2016-12-16 (×2): qty 0.4

## 2016-12-16 MED ORDER — LABETALOL HCL 5 MG/ML IV SOLN: 5.0000 mg | INTRAVENOUS | Status: DC | PRN

## 2016-12-16 MED ORDER — OXYCODONE-ACETAMINOPHEN 5-325 MG PO TABS
1.0000 | ORAL_TABLET | ORAL | Status: DC | PRN
  Administered 2016-12-16 (×2): 1 via ORAL
  Filled 2016-12-16 (×2): qty 1

## 2016-12-16 NOTE — ED Notes (Signed)
Transporting pt to room 226 via stretcher at this time.

## 2016-12-16 NOTE — Plan of Care (Signed)
Patient verbalized understanding, will continue to monitor for needs and concerns.

## 2016-12-16 NOTE — H&P (Signed)
Cassidy Mathis is an 41 y.o. female.   Chief Complaint: Abdominal pain HPI: The patient with past medical history of kidney stones, hypertension and recent cholecystectomy presents to the emergency department complaining of abdominal pain.  Pain began last night and has gradually worsened.  The patient has also had multiple episodes of nonbloody nonbilious emesis.  She continues to be nauseous and have abdominal pain.  Laboratory evaluation revealed elevated lipase and CT of her abdomen showed an edematous pancreas without necrosis consistent with pancreatitis.  Common bile duct was also dilated.  Thus the emergency department staff called the hospitalist service for further evaluation and management.  Past Medical History:  Diagnosis Date  . Allergy    seasonal  . Anemia   . Chronic kidney disease    KIDNEY STONES CURRENTLY  . Depression   . GERD (gastroesophageal reflux disease)    NO MEDS  . Headache    MIGRAINES  . Hypertension    PT STATES SHE IS SUPPOSED TO BE TAKING LISINOPRIL-HCTZ BUT HAS BEEN OUT "FOR A WHILE" NEEDS TO GET ANOTHER PRESCRIPTION FROM HER PCP  . Pilonidal cyst    TOOK ANTIBIOTIC THIS MONTH AND IT IS RESOLVED    Past Surgical History:  Procedure Laterality Date  . CHOLECYSTECTOMY  10/21/2015   Procedure: LAPAROSCOPIC CHOLECYSTECTOMY WITH INTRAOPERATIVE CHOLANGIOGRAM;  Surgeon: Jules Husbands, MD;  Location: [facility] ORS;  Service: General;;  . CYST EXCISION Left 1998   wrist  . CYST EXCISION  1998   tongue  . EPIGASTRIC HERNIA REPAIR N/A 04/18/2015   Procedure: HERNIA REPAIR EPIGASTRIC ADULT;  Surgeon: Jules Husbands, MD;  Location: [facility] ORS;  Service: General;  Laterality: N/A;  . TUBAL LIGATION      Family History  Problem Relation Age of Onset  . Hypertension Mother   . Kidney Stones Mother   . Gallstones Mother   . Hypertension Father   . Gout Father   . Cancer Paternal Uncle        lung  . Cancer Maternal Grandmother        not sure what type of  cancer  . Cancer Maternal Grandfather        not sure what type of cancer  . Cancer Paternal Grandmother        not sure what type of cancer  . Cancer Paternal Grandfather        not sure what type of cancer   Social History:  reports that she has been smoking cigarettes.  She has a 6.00 pack-year smoking history. she has never used smokeless tobacco. She reports that she drinks alcohol. She reports that she uses drugs. Drug: Marijuana.  Allergies: No Known Allergies   (Not in a hospital admission)  Results for orders placed or performed during the hospital encounter of 12/15/16 (from the past 48 hour(s))  Lipase, blood     Status: Abnormal   Collection Time: 12/15/16  5:36 PM  Result Value Ref Range   Lipase 1,157 (H) 11 - 51 U/L    Comment: RESULT CONFIRMED BY MANUAL DILUTION. TCH.  Comprehensive metabolic panel     Status: Abnormal   Collection Time: 12/15/16  5:36 PM  Result Value Ref Range   Sodium 138 135 - 145 mmol/L   Potassium 3.4 (L) 3.5 - 5.1 mmol/L   Chloride 107 101 - 111 mmol/L   CO2 23 22 - 32 mmol/L   Glucose, Bld 84 65 - 99 mg/dL   BUN 12 6 -  20 mg/dL   Creatinine, Ser 0.81 0.44 - 1.00 mg/dL   Calcium 9.0 8.9 - 10.3 mg/dL   Total Protein 7.4 6.5 - 8.1 g/dL   Albumin 4.0 3.5 - 5.0 g/dL   AST 335 (H) 15 - 41 U/L   ALT 410 (H) 14 - 54 U/L    Comment: RESULT CONFIRMED BY MANUAL DILUTION. TCH.   Alkaline Phosphatase 251 (H) 38 - 126 U/L   Total Bilirubin 1.5 (H) 0.3 - 1.2 mg/dL   GFR calc non Af Amer >60 >60 mL/min   GFR calc Af Amer >60 >60 mL/min    Comment: (NOTE) The eGFR has been calculated using the CKD EPI equation. This calculation has not been validated in all clinical situations. eGFR's persistently <60 mL/min signify possible Chronic Kidney Disease.    Anion gap 8 5 - 15  CBC     Status: Abnormal   Collection Time: 12/15/16  5:36 PM  Result Value Ref Range   WBC 13.3 (H) 3.6 - 11.0 K/uL   RBC 4.69 3.80 - 5.20 MIL/uL   Hemoglobin 14.4 12.0 -  16.0 g/dL   HCT 41.8 35.0 - 47.0 %   MCV 89.0 80.0 - 100.0 fL   MCH 30.6 26.0 - 34.0 pg   MCHC 34.4 32.0 - 36.0 g/dL   RDW 13.4 11.5 - 14.5 %   Platelets 387 150 - 440 K/uL  Urinalysis, Complete w Microscopic     Status: Abnormal   Collection Time: 12/15/16  5:36 PM  Result Value Ref Range   Color, Urine AMBER (A) YELLOW    Comment: BIOCHEMICALS MAY BE AFFECTED BY COLOR   APPearance CLEAR (A) CLEAR   Specific Gravity, Urine 1.034 (H) 1.005 - 1.030   pH 5.0 5.0 - 8.0   Glucose, UA NEGATIVE NEGATIVE mg/dL   Hgb urine dipstick MODERATE (A) NEGATIVE   Bilirubin Urine NEGATIVE NEGATIVE   Ketones, ur 80 (A) NEGATIVE mg/dL   Protein, ur 100 (A) NEGATIVE mg/dL   Nitrite NEGATIVE NEGATIVE   Leukocytes, UA TRACE (A) NEGATIVE   RBC / HPF TOO NUMEROUS TO COUNT 0 - 5 RBC/hpf   WBC, UA 6-30 0 - 5 WBC/hpf   Bacteria, UA NONE SEEN NONE SEEN   Squamous Epithelial / LPF 6-30 (A) NONE SEEN   Mucus PRESENT   Pregnancy, urine     Status: None   Collection Time: 12/15/16  5:36 PM  Result Value Ref Range   Preg Test, Ur NEGATIVE NEGATIVE  Pregnancy, urine POC     Status: None   Collection Time: 12/15/16  6:15 PM  Result Value Ref Range   Preg Test, Ur NEGATIVE NEGATIVE    Comment:        THE SENSITIVITY OF THIS METHODOLOGY IS >24 mIU/mL    Ct Abdomen Pelvis W Contrast  Result Date: 12/15/2016 CLINICAL DATA:  Epigastric pain since last night. EXAM: CT ABDOMEN AND PELVIS WITH CONTRAST TECHNIQUE: Multidetector CT imaging of the abdomen and pelvis was performed using the standard protocol following bolus administration of intravenous contrast. CONTRAST:  157m ISOVUE-300 IOPAMIDOL (ISOVUE-300) INJECTION 61% COMPARISON:  None. FINDINGS: Lower chest:  No acute finding. Hepatobiliary: Possible steatosis. No focal lesion.Cholecystectomy. 8 mm common bile duct, prominent given there is pancreatitis. No calcified choledocholithiasis. Prominent angular projecting into the duodenal lumen, well seen given the  duodenal distension and contrast filling. Patient does have elevated bilirubin. Pancreas: Expanded parenchyma with diffuse retroperitoneal edema centered around the pancreas. No organized collection or necrosis.  No acute vascular compromise. Spleen: Negative.  No enlargement or infarct. Adrenals/Urinary Tract: Negative adrenals. No hydronephrosis or stone. Unremarkable bladder. Stomach/Bowel:  No obstruction. No appendicitis. Vascular/Lymphatic: No acute vascular abnormality. Atheromatous wall thickening of the infrarenal abdominal aorta. No acute vascular finding. No mass or adenopathy. Reproductive:No pathologic findings. Other: Small volume pelvic ascites considered reactive in this setting. Musculoskeletal: Sacroiliac sclerosis and mild spurring with bilateral vacuum phenomenon, overall degenerative appearing. IMPRESSION: 1. Acute edematous pancreatitis. No necrosis or organized collection. 2. Cholecystectomy with prominent common bile duct (8 mm) and ampulla but no calcified choledocholithiasis. Depending on level of suspicion for stone, an ERCP or MRCP could be obtained. Electronically Signed   By: Monte Fantasia M.D.   On: 12/15/2016 19:51    Review of Systems  Constitutional: Negative for chills and fever.  HENT: Negative for sore throat and tinnitus.   Eyes: Negative for blurred vision and redness.  Respiratory: Negative for cough and shortness of breath.   Cardiovascular: Negative for chest pain, palpitations, orthopnea and PND.  Gastrointestinal: Positive for abdominal pain, nausea and vomiting. Negative for diarrhea.  Genitourinary: Negative for dysuria, frequency and urgency.  Musculoskeletal: Negative for joint pain and myalgias.  Skin: Negative for rash.       No lesions  Neurological: Negative for speech change, focal weakness and weakness.  Endo/Heme/Allergies: Does not bruise/bleed easily.       No temperature intolerance  Psychiatric/Behavioral: Negative for depression and  suicidal ideas.    Blood pressure (!) 135/91, pulse 69, temperature 99.2 F (37.3 C), temperature source Oral, resp. rate 18, height '5\' 5"'$  (1.651 m), weight 81.6 kg (180 lb), last menstrual period 12/13/2016, SpO2 95 %. Physical Exam  Vitals reviewed. Constitutional: She is oriented to person, place, and time. She appears well-developed and well-nourished. No distress.  HENT:  Head: Normocephalic and atraumatic.  Mouth/Throat: Oropharynx is clear and moist.  Eyes: Conjunctivae and EOM are normal. Pupils are equal, round, and reactive to light. No scleral icterus.  Neck: Normal range of motion. Neck supple. No JVD present. No tracheal deviation present. No thyromegaly present.  Cardiovascular: Normal rate, regular rhythm and normal heart sounds. Exam reveals no gallop and no friction rub.  No murmur heard. Respiratory: Effort normal and breath sounds normal.  GI: Soft. Bowel sounds are normal. She exhibits no distension and no mass. There is tenderness. There is no rebound and no guarding.  Genitourinary:  Genitourinary Comments: Deferred  Musculoskeletal: Normal range of motion. She exhibits no edema.  Lymphadenopathy:    She has no cervical adenopathy.  Neurological: She is alert and oriented to person, place, and time. No cranial nerve deficit. She exhibits normal muscle tone.  Skin: Skin is warm and dry. No rash noted. No erythema.  Psychiatric: She has a normal mood and affect. Her behavior is normal. Judgment and thought content normal.     Assessment/Plan This is a 41 year old female admitted for pancreatitis. 1.  Pancreatitis: N.p.o.; hydrate aggressively with intravenous fluid.  Manage pain.  Check lipids. 2.  Transaminitis: Suspect intra-hepatic biliary stones; CT scan shows dilated common bile duct.  Bilirubin is not very elevated at this time thus no need for transfer.  Will obtain MRCP due to dilated biliary ducts. 3.  Hypertension: Controlled; will hold oral  antihypertensive medication.  Labetalol as needed. 4.  Hypokalemia: Replete potassium. 5.  DVT prophylaxis: Lovenox 6.  GI prophylaxis: PPI The patient is a full code.  Time spent on admission orders and patient care approximately  80 minutes  Harrie Foreman, MD 12/16/2016, 12:26 AM

## 2016-12-16 NOTE — Progress Notes (Signed)
Per MD okay for RN to place 2 different one dose orders for ativan. Pt will be going to MRI. Give second dose of Ativan only if pt still needs it after first dose. If pt does not need second dose Per MD okay for nurse to DC order.

## 2016-12-16 NOTE — Consult Note (Addendum)
Cephas Darby, MD [address]  [place]  [place], [place] [address]  Main: [phone]  Fax: [phone] Pager: [phone]   Consultation  Referring Provider:     No ref. provider found Primary Care Physician:  Letta Median, MD Primary Gastroenterologist: None      Reason for Consultation:   Acute pancreatitis, abnormal LFTs  Date of Admission:  12/15/2016 Date of Consultation:  12/16/2016         HPI:   Cassidy Mathis is a 41 y.o. female with history of hypertension, nephrolithiasis, underwent laparoscopic cholecystectomy in 09/2015 by Dr. Dahlia Byes secondary to symptomatic cholelithiasis.  She presented to ER yesterday because of sudden onset of epigastric pain that started yesterday evening associated with nausea and nonbloody emesis.  She was found to have mild leukocytosis, elevated lipase greater than 1000, elevated LFTs with cholestatic picture.  She had a CT last night which revealed edematous pancreas, and prominent ampulla, CBD.  There was no evidence of choledocholithiasis.  Patient denied having fever, chills, diarrhea. She reports having regular BMs  She has been having chest pain past 1 year and has been taking NSAIDs about 4days/week. She does have intermittent heart burn and has been taking PPI which does not provide relief. She reports that the chest pain is similar to epigastric pain. She was told that her chest pains are due to panic attacks Of note, Intra-Op cholangiogram revealed Generous cystic duct, normal common bile duct no evidence of common bile duct injury , contrast did not reach the duodenum. She is kept n.p.o., receiving IV fluids and not on any antibiotics She has been afebrile and hemodynamically stable  NSAIDs: Ibuprofen and diclofenac as needed  Antiplts/Anticoagulants/Anti thrombotics: advil, aleve  She smokes cigarettes and marijuana ETOH occasional She has 3 teen age kids and worried about them and there is no one home to  take care of them She works in a Medical laboratory scientific officer Unknown family history  GI Procedures: none  Past Medical History:  Diagnosis Date  . Allergy    seasonal  . Anemia   . Chronic kidney disease    KIDNEY STONES CURRENTLY  . Depression   . GERD (gastroesophageal reflux disease)    NO MEDS  . Headache    MIGRAINES  . Hypertension    PT STATES SHE IS SUPPOSED TO BE TAKING LISINOPRIL-HCTZ BUT HAS BEEN OUT "FOR A WHILE" NEEDS TO GET ANOTHER PRESCRIPTION FROM HER PCP  . Pilonidal cyst    TOOK ANTIBIOTIC THIS MONTH AND IT IS RESOLVED    Past Surgical History:  Procedure Laterality Date  . CHOLECYSTECTOMY  10/21/2015   Procedure: LAPAROSCOPIC CHOLECYSTECTOMY WITH INTRAOPERATIVE CHOLANGIOGRAM;  Surgeon: Jules Husbands, MD;  Location: [facility] ORS;  Service: General;;  . CYST EXCISION Left 1998   wrist  . CYST EXCISION  1998   tongue  . EPIGASTRIC HERNIA REPAIR N/A 04/18/2015   Procedure: HERNIA REPAIR EPIGASTRIC ADULT;  Surgeon: Jules Husbands, MD;  Location: [facility] ORS;  Service: General;  Laterality: N/A;  . TUBAL LIGATION      Prior to Admission medications   Medication Sig Start Date End Date Taking? Authorizing Provider  ibuprofen (ADVIL,MOTRIN) 200 MG tablet Take 200-400 mg by mouth every 6 (six) hours as needed.   Yes [provider]  lisinopril-hydrochlorothiazide (PRINZIDE,ZESTORETIC) 20-25 MG tablet Take 1 tablet by mouth daily. 10/13/15  Yes [provider]  diclofenac (VOLTAREN) 75 MG EC tablet Take 1 tablet (75 mg  total) by mouth 2 (two) times daily. Patient not taking: Reported on 12/15/2016 03/10/16   Johnn Hai, PA-C    Family History  Problem Relation Age of Onset  . Hypertension Mother   . Kidney Stones Mother   . Gallstones Mother   . Hypertension Father   . Gout Father   . Cancer Paternal Uncle        lung  . Cancer Maternal Grandmother        not sure what type of cancer  . Cancer Maternal Grandfather        not sure what type of cancer  .  Cancer Paternal Grandmother        not sure what type of cancer  . Cancer Paternal Grandfather        not sure what type of cancer     Social History   Tobacco Use  . Smoking status: Current Every Day Smoker    Packs/day: 0.50    Years: 12.00    Pack years: 6.00    Types: Cigarettes  . Smokeless tobacco: Never Used  Substance Use Topics  . Alcohol use: Yes    Alcohol/week: 0.0 oz    Comment: Barely  . Drug use: Yes    Types: Marijuana    Comment: "not often"    Allergies as of 12/15/2016  . (No Known Allergies)    Review of Systems:    All systems reviewed and negative except where noted in HPI.   Physical Exam:  Vital signs in last 24 hours: Temp:  [97.7 F (36.5 C)-99.2 F (37.3 C)] 98.6 F (37 C) (11/22 1300) Pulse Rate:  [65-130] 95 (11/22 1300) Resp:  [16-20] 16 (11/22 1300) BP: (122-179)/(68-118) 131/68 (11/22 1300) SpO2:  [93 %-100 %] 99 % (11/22 1300) Weight:  [180 lb (81.6 kg)-184 lb 14.4 oz (83.9 kg)] 184 lb 14.4 oz (83.9 kg) (11/22 0153) Last BM Date: 12/15/16 General:   Pleasant, cooperative in NAD Head:  Normocephalic and atraumatic. Eyes:   No icterus.   Conjunctiva pink. PERRLA. Ears:  Normal auditory acuity. Neck:  Supple; no masses or thyroidomegaly Lungs: Respirations even and unlabored. Lungs clear to auscultation bilaterally.   No wheezes, crackles, or rhonchi.  Heart:  Regular rate and rhythm;  Without murmur, clicks, rubs or gallops Abdomen:  Soft, nondistended, moderate epigastric tenderness. Normal bowel sounds. No appreciable masses or hepatomegaly.  No rebound or guarding.  Rectal:  Not performed. Msk:  Symmetrical without gross deformities.  Strength normal  Extremities:  Without edema, cyanosis or clubbing. Neurologic:  Alert and oriented x3;  grossly normal neurologically. Skin:  Intact without significant lesions or rashes. Cervical Nodes:  No significant cervical adenopathy. Psych:  Alert and cooperative. Normal affect.  LAB  RESULTS: CBC Latest Ref Rng & Units 12/15/2016 10/04/2015 04/13/2015  WBC 3.6 - 11.0 K/uL 13.3(H) 7.7 9.9  Hemoglobin 12.0 - 16.0 g/dL 14.4 13.4 11.9(L)  Hematocrit 35.0 - 47.0 % 41.8 38.6 34.9(L)  Platelets 150 - 440 K/uL 387 462(H) 352    BMET BMP Latest Ref Rng & Units 12/15/2016 10/31/2015 10/24/2015  Glucose 65 - 99 mg/dL 84 93 88  BUN 6 - 20 mg/dL _0 Creatinine 0.44 - 1.00 mg/dL 0.81 0.92 0.99  Sodium 135 - 145 mmol/L 138 138 138  Potassium 3.5 - 5.1 mmol/L 3.4(L) 3.9 3.8  Chloride 101 - 111 mmol/L 107 106 108  CO2 22 - 32 mmol/L _1 Calcium 8.9 - 10.3  mg/dL 9.0 8.8(L) 8.9    LFT Hepatic Function Latest Ref Rng & Units 12/15/2016 10/31/2015 10/24/2015  Total Protein 6.5 - 8.1 g/dL 7.4 7.1 6.9  Albumin 3.5 - 5.0 g/dL 4.0 3.7 3.6  AST 15 - 41 U/L 335(H) 27 64(H)  ALT 14 - 54 U/L 410(H) 64(H) 236(H)  Alk Phosphatase 38 - 126 U/L 251(H) 75 97  Total Bilirubin 0.3 - 1.2 mg/dL 1.5(H) 0.2(L) 0.6  Bilirubin, Direct 0.1 - 0.5 mg/dL - - -     STUDIES: Ct Abdomen Pelvis W Contrast  Result Date: 12/15/2016 CLINICAL DATA:  Epigastric pain since last night. EXAM: CT ABDOMEN AND PELVIS WITH CONTRAST TECHNIQUE: Multidetector CT imaging of the abdomen and pelvis was performed using the standard protocol following bolus administration of intravenous contrast. CONTRAST:  119m ISOVUE-300 IOPAMIDOL (ISOVUE-300) INJECTION 61% COMPARISON:  None. FINDINGS: Lower chest:  No acute finding. Hepatobiliary: Possible steatosis. No focal lesion.Cholecystectomy. 8 mm common bile duct, prominent given there is pancreatitis. No calcified choledocholithiasis. Prominent angular projecting into the duodenal lumen, well seen given the duodenal distension and contrast filling. Patient does have elevated bilirubin. Pancreas: Expanded parenchyma with diffuse retroperitoneal edema centered around the pancreas. No organized collection or necrosis. No acute vascular compromise. Spleen: Negative.  No enlargement  or infarct. Adrenals/Urinary Tract: Negative adrenals. No hydronephrosis or stone. Unremarkable bladder. Stomach/Bowel:  No obstruction. No appendicitis. Vascular/Lymphatic: No acute vascular abnormality. Atheromatous wall thickening of the infrarenal abdominal aorta. No acute vascular finding. No mass or adenopathy. Reproductive:No pathologic findings. Other: Small volume pelvic ascites considered reactive in this setting. Musculoskeletal: Sacroiliac sclerosis and mild spurring with bilateral vacuum phenomenon, overall degenerative appearing. IMPRESSION: 1. Acute edematous pancreatitis. No necrosis or organized collection. 2. Cholecystectomy with prominent common bile duct (8 mm) and ampulla but no calcified choledocholithiasis. Depending on level of suspicion for stone, an ERCP or MRCP could be obtained. Electronically Signed   By: JMonte FantasiaM.D.   On: 12/15/2016 19:51      Impression / Plan:   TLITTLE BASHOREis a 41y.o. female with presents with 1 day history of epigastric pain, nausea,lap chole secondary to symptomatic cholelithiasis in 09/2015 vomiting found to have acute pancreatitis and abnormal LFTs with cholestatic picture.  CT revealed prominent CBD and ampulla.  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  She does not have evidence of ascending cholangitis or necrotizing pancreatitis. She also has chronic chest pain, heart burn on chronic NSAID use  -No urgent indication for ERCP  -Recommend MRCP -Check CBC, LFTs today and daily  -Acute viral hepatitis panel -Continue IV fluids, LR 5048mbolus -Agree with n.p.o. status -Monitor electrolytes, creatinine, replete lytes as needed -We will determine the need for ERCP based on the MRCP findings and after her acute pancreatitis improves -Also, recommend non-urgent EGD to evaluate chest pain - Avoid NSAIDs - Continue PPI BID  Thank you for involving me in the care of  this patient.      LOS: 0 days   RoSherri SearMD  12/16/2016, 1:11 PM   Note: This dictation was prepared with Dragon dictation along with smaller phrase technology. Any transcriptional errors that result from this process are unintentional.

## 2016-12-16 NOTE — Progress Notes (Addendum)
[place] [facility] [place] NAME: Cassidy Mathis    MR#:  [id number]  DATE OF BIRTH:  Aug 03, 1975  SUBJECTIVE:  Came in with Abdominal pain Had some ETOH to drink 1 days ago No vomiting  REVIEW OF SYSTEMS:   Review of Systems  Constitutional: Negative for chills, fever and weight loss.  HENT: Negative for ear discharge, ear pain and nosebleeds.   Eyes: Negative for blurred vision, pain and discharge.  Respiratory: Negative for sputum production, shortness of breath, wheezing and stridor.   Cardiovascular: Negative for chest pain, palpitations, orthopnea and PND.  Gastrointestinal: Positive for abdominal pain and nausea. Negative for diarrhea and vomiting.  Genitourinary: Negative for frequency and urgency.  Musculoskeletal: Negative for back pain and joint pain.  Neurological: Positive for weakness. Negative for sensory change, speech change and focal weakness.  Psychiatric/Behavioral: Negative for depression and hallucinations. The patient is not nervous/anxious.    Tolerating Diet:npo Tolerating PT: ambulatory  DRUG ALLERGIES:  No Known Allergies  VITALS:  Blood pressure 122/72, pulse 82, temperature 98.5 F (36.9 C), temperature source Oral, resp. rate 20, height 5\' 5"  (1.651 m), weight 83.9 kg (184 lb 14.4 oz), last menstrual period 12/13/2016, SpO2 100 %.  PHYSICAL EXAMINATION:   Physical Exam  GENERAL:  41 y.o.-year-old patient lying in the bed with no acute distress.  EYES: Pupils equal, round, reactive to light and accommodation. No scleral icterus. Extraocular muscles intact.  HEENT: Head atraumatic, normocephalic. Oropharynx and nasopharynx clear.  NECK:  Supple, no jugular venous distention. No thyroid enlargement, no tenderness.  LUNGS: Normal breath sounds bilaterally, no wheezing, rales, rhonchi. No use of accessory muscles of respiration.  CARDIOVASCULAR: S1, S2 normal. No murmurs, rubs, or gallops.  ABDOMEN: Soft,  mild epigastric tenderness, nondistended. Bowel sounds present. No organomegaly or mass.  EXTREMITIES: No cyanosis, clubbing or edema b/l.    NEUROLOGIC: Cranial nerves II through XII are intact. No focal Motor or sensory deficits b/l.   PSYCHIATRIC:  patient is alert and oriented x 3.  SKIN: No obvious rash, lesion, or ulcer.   LABORATORY PANEL:  CBC Recent Labs  Lab 12/15/16 1736  WBC 13.3*  HGB 14.4  HCT 41.8  PLT 387    Chemistries  Recent Labs  Lab 12/15/16 1736  NA 138  K 3.4*  CL 107  CO2 23  GLUCOSE 84  BUN 12  CREATININE 0.81  CALCIUM 9.0  AST 335*  ALT 410*  ALKPHOS 251*  BILITOT 1.5*   Cardiac Enzymes No results for input(s): TROPONINI in the last 168 hours. RADIOLOGY:  Ct Abdomen Pelvis W Contrast  Result Date: 12/15/2016 CLINICAL DATA:  Epigastric pain since last night. EXAM: CT ABDOMEN AND PELVIS WITH CONTRAST TECHNIQUE: Multidetector CT imaging of the abdomen and pelvis was performed using the standard protocol following bolus administration of intravenous contrast. CONTRAST:  180mL ISOVUE-300 IOPAMIDOL (ISOVUE-300) INJECTION 61% COMPARISON:  None. FINDINGS: Lower chest:  No acute finding. Hepatobiliary: Possible steatosis. No focal lesion.Cholecystectomy. 8 mm common bile duct, prominent given there is pancreatitis. No calcified choledocholithiasis. Prominent angular projecting into the duodenal lumen, well seen given the duodenal distension and contrast filling. Patient does have elevated bilirubin. Pancreas: Expanded parenchyma with diffuse retroperitoneal edema centered around the pancreas. No organized collection or necrosis. No acute vascular compromise. Spleen: Negative.  No enlargement or infarct. Adrenals/Urinary Tract: Negative adrenals. No hydronephrosis or stone. Unremarkable bladder. Stomach/Bowel:  No obstruction. No appendicitis. Vascular/Lymphatic: No acute vascular abnormality. Atheromatous wall  thickening of the infrarenal abdominal aorta. No  acute vascular finding. No mass or adenopathy. Reproductive:No pathologic findings. Other: Small volume pelvic ascites considered reactive in this setting. Musculoskeletal: Sacroiliac sclerosis and mild spurring with bilateral vacuum phenomenon, overall degenerative appearing. IMPRESSION: 1. Acute edematous pancreatitis. No necrosis or organized collection. 2. Cholecystectomy with prominent common bile duct (8 mm) and ampulla but no calcified choledocholithiasis. Depending on level of suspicion for stone, an ERCP or MRCP could be obtained. Electronically Signed   By: Monte Fantasia M.D.   On: 12/15/2016 19:51   ASSESSMENT AND PLAN:  41 year old female admitted for pancreatitis. 1. Acute Pancreatitis:?ETOH ?CBD stone - N.p.o.; hydrate aggressively with intravenous fluid.  - CT abdomen s/p pancreatitis -IVF, prn pain meds -GI consult pending -total bili 1.5  2.  Transaminitis: Suspect intra-hepatic biliary stones; CT scan shows dilated common bile duct.  Bilirubin is not very elevated at this time thus no need for transfer. - ? obtain MRCP due to dilated biliary ducts--await till GI sees her  3.  Hypertension: Controlled; will hold oral antihypertensive medication.  Labetalol as needed.  4.  Hypokalemia: Replete potassium.  5.  DVT prophylaxis: Lovenox  Case discussed with Care Management/Social Worker. Management plans discussed with the patient, family and they are in agreement.  CODE STATUS: FULL  DVT Prophylaxis: lovenox  TOTAL TIME TAKING CARE OF THIS PATIENT: *25* minutes.  >50% time spent on counselling and coordination of care  POSSIBLE D/C IN **1-2 DAYS, DEPENDING ON CLINICAL CONDITION.  Note: This dictation was prepared with Dragon dictation along with smaller phrase technology. Any transcriptional errors that result from this process are unintentional.  Fritzi Mandes M.D on 12/16/2016 at 12:58 PM  Between 7am to 6pm - Pager - [phone]  After 6pm go to [email or web address] -  password [email or web address] [facility]  Office  [phone]  CC: Primary care physician; Letta Median, MD

## 2016-12-17 DIAGNOSIS — K851 Biliary acute pancreatitis without necrosis or infection: Secondary | ICD-10-CM

## 2016-12-17 DIAGNOSIS — K8050 Calculus of bile duct without cholangitis or cholecystitis without obstruction: Principal | ICD-10-CM

## 2016-12-17 LAB — LIPASE, BLOOD: Lipase: 108 U/L — ABNORMAL HIGH (ref 11–51)

## 2016-12-17 LAB — HEPATIC FUNCTION PANEL
ALBUMIN: 3 g/dL — AB (ref 3.5–5.0)
ALK PHOS: 157 U/L — AB (ref 38–126)
ALT: 164 U/L — ABNORMAL HIGH (ref 14–54)
AST: 70 U/L — AB (ref 15–41)
BILIRUBIN TOTAL: 1 mg/dL (ref 0.3–1.2)
Bilirubin, Direct: 0.2 mg/dL (ref 0.1–0.5)
Indirect Bilirubin: 0.8 mg/dL (ref 0.3–0.9)
Total Protein: 5.9 g/dL — ABNORMAL LOW (ref 6.5–8.1)

## 2016-12-17 MED ORDER — OXYCODONE-ACETAMINOPHEN 5-325 MG PO TABS: 1.0000 | ORAL_TABLET | ORAL | 0 refills | Status: DC | PRN

## 2016-12-17 MED ORDER — SODIUM CHLORIDE 0.9 % IV SOLN
INTRAVENOUS | Status: DC
  Administered 2016-12-17: 09:00:00 via INTRAVENOUS

## 2016-12-17 MED ORDER — ONDANSETRON HCL 4 MG PO TABS: 4.0000 mg | ORAL_TABLET | Freq: Three times a day (TID) | ORAL | 0 refills | Status: DC | PRN

## 2016-12-17 NOTE — Discharge Summary (Signed)
[place] [facility] [place] NAME: Cassidy Mathis    MR#:  [id number]  DATE OF BIRTH:  [birth year]  DATE OF ADMISSION:  12/15/2016 ADMITTING PHYSICIAN: Harrie Foreman, MD  DATE OF DISCHARGE: 12/17/2016  PRIMARY CARE PHYSICIAN: Letta Median, MD    ADMISSION DIAGNOSIS:  Idiopathic acute pancreatitis without infection or necrosis [K85.00]  DISCHARGE DIAGNOSIS:  Acute Pancreatitis due to Choledocholithiasis---ERCP to be done as out pt  SECONDARY DIAGNOSIS:   Past Medical History:  Diagnosis Date  . Allergy    seasonal  . Anemia   . Chronic kidney disease    KIDNEY STONES CURRENTLY  . Depression   . GERD (gastroesophageal reflux disease)    NO MEDS  . Headache    MIGRAINES  . Hypertension    PT STATES SHE IS SUPPOSED TO BE TAKING LISINOPRIL-HCTZ BUT HAS BEEN OUT "FOR A WHILE" NEEDS TO GET ANOTHER PRESCRIPTION FROM HER PCP  . Pilonidal cyst    TOOK ANTIBIOTIC THIS MONTH AND IT IS RESOLVED    HOSPITAL COURSE:   41 year old female admitted for pancreatitis. 1.Acute Pancreatitis due to CBD stone -  Hydration with intravenous fluid.  - CT abdomen s/p pancreatitis -received IVF, prn pain meds -GI consult with Dr Marius Ditch appreciated---Unable to ERCP here at [facility] or [facility] due to holiday weekend -total bili 1.5-- 1.0, LFT's improved a lot and lipase down to 108 -plan is to start CLD--d/c pt home later and f/u as out pt with GI next week to get ERCP done. -pt agreeable with the plan  2. Transaminitis: Suspect intra-hepatic biliary stones;CT scanshows dilated common bile duct. Bilirubin is not veryelevated at this time thus no need for transfer. - LFT's trending down -MRCP positive for CBD stone  3. Hypertension: Controlled -resume oral antihypertensive medication.   4. Hypokalemia: Replete potassium.  5. DVT prophylaxis: Lovenox  D/c home later today  CONSULTS OBTAINED:  Treatment Team:  Lin Landsman, MD  DRUG ALLERGIES:  No Known Allergies  DISCHARGE MEDICATIONS:   Current Discharge Medication List    START taking these medications   Details  ondansetron (ZOFRAN) 4 MG tablet Take 1 tablet (4 mg total) by mouth every 8 (eight) hours as needed for nausea or vomiting. Qty: 20 tablet, Refills: 0    oxyCODONE-acetaminophen (PERCOCET/ROXICET) 5-325 MG tablet Take 1-2 tablets by mouth every 4 (four) hours as needed for moderate pain. Qty: 25 tablet, Refills: 0      CONTINUE these medications which have NOT CHANGED   Details  lisinopril-hydrochlorothiazide (PRINZIDE,ZESTORETIC) 20-25 MG tablet Take 1 tablet by mouth daily. Refills: 0      STOP taking these medications     ibuprofen (ADVIL,MOTRIN) 200 MG tablet      diclofenac (VOLTAREN) 75 MG EC tablet         If you experience worsening of your admission symptoms, develop shortness of breath, life threatening emergency, suicidal or homicidal thoughts you must seek medical attention immediately by calling 911 or calling your MD immediately  if symptoms less severe.  You Must read complete instructions/literature along with all the possible adverse reactions/side effects for all the Medicines you take and that have been prescribed to you. Take any new Medicines after you have completely understood and accept all the possible adverse reactions/side effects.   Please note  You were cared for by a hospitalist during your hospital stay. If you have any questions about your discharge medications or the care  you received while you were in the hospital after you are discharged, you can call the unit and asked to speak with the hospitalist on call if the hospitalist that took care of you is not available. Once you are discharged, your primary care physician will handle any further medical issues. Please note that NO REFILLS for any discharge medications will be authorized once you are discharged, as it is imperative that you  return to your primary care physician (or establish a relationship with a primary care physician if you do not have one) for your aftercare needs so that they can reassess your need for medications and monitor your lab values. Today   SUBJECTIVE  Some mild abdominal pain last nite   VITAL SIGNS:  Blood pressure 131/70, pulse 82, temperature 98.4 F (36.9 C), temperature source Oral, resp. rate 20, height 5\' 5"  (1.651 m), weight 86.6 kg (190 lb 14.4 oz), last menstrual period 12/13/2016, SpO2 100 %.  I/O:    Intake/Output Summary (Last 24 hours) at 12/17/2016 1004 Last data filed at 12/17/2016 0956 Gross per 24 hour  Intake 2670 ml  Output 850 ml  Net 1820 ml    PHYSICAL EXAMINATION:  GENERAL:  41 y.o.-year-old patient lying in the bed with no acute distress.  EYES: Pupils equal, round, reactive to light and accommodation. No scleral icterus. Extraocular muscles intact.  HEENT: Head atraumatic, normocephalic. Oropharynx and nasopharynx clear.  NECK:  Supple, no jugular venous distention. No thyroid enlargement, no tenderness.  LUNGS: Normal breath sounds bilaterally, no wheezing, rales,rhonchi or crepitation. No use of accessory muscles of respiration.  CARDIOVASCULAR: S1, S2 normal. No murmurs, rubs, or gallops.  ABDOMEN: Soft, non-tender, non-distended. Bowel sounds present. No organomegaly or mass.  EXTREMITIES: No pedal edema, cyanosis, or clubbing.  NEUROLOGIC: Cranial nerves II through XII are intact. Muscle strength 5/5 in all extremities. Sensation intact. Gait not checked.  PSYCHIATRIC: The patient is alert and oriented x 3.  SKIN: No obvious rash, lesion, or ulcer.   DATA REVIEW:   CBC  Recent Labs  Lab 12/16/16 1410  WBC 15.5*  HGB 12.2  HCT 36.1  PLT 326    Chemistries  Recent Labs  Lab 12/15/16 1736  12/17/16 0251  NA 138  --   --   K 3.4*  --   --   CL 107  --   --   CO2 23  --   --   GLUCOSE 84  --   --   BUN 12  --   --   CREATININE 0.81  --    --   CALCIUM 9.0  --   --   AST 335*   < > 70*  ALT 410*   < > 164*  ALKPHOS 251*   < > 157*  BILITOT 1.5*   < > 1.0   < > = values in this interval not displayed.    Microbiology Results   No results found for this or any previous visit (from the past 240 hour(s)).  RADIOLOGY:  Ct Abdomen Pelvis W Contrast  Result Date: 12/15/2016 CLINICAL DATA:  Epigastric pain since last night. EXAM: CT ABDOMEN AND PELVIS WITH CONTRAST TECHNIQUE: Multidetector CT imaging of the abdomen and pelvis was performed using the standard protocol following bolus administration of intravenous contrast. CONTRAST:  119mL ISOVUE-300 IOPAMIDOL (ISOVUE-300) INJECTION 61% COMPARISON:  None. FINDINGS: Lower chest:  No acute finding. Hepatobiliary: Possible steatosis. No focal lesion.Cholecystectomy. 8 mm common bile duct, prominent given there is  pancreatitis. No calcified choledocholithiasis. Prominent angular projecting into the duodenal lumen, well seen given the duodenal distension and contrast filling. Patient does have elevated bilirubin. Pancreas: Expanded parenchyma with diffuse retroperitoneal edema centered around the pancreas. No organized collection or necrosis. No acute vascular compromise. Spleen: Negative.  No enlargement or infarct. Adrenals/Urinary Tract: Negative adrenals. No hydronephrosis or stone. Unremarkable bladder. Stomach/Bowel:  No obstruction. No appendicitis. Vascular/Lymphatic: No acute vascular abnormality. Atheromatous wall thickening of the infrarenal abdominal aorta. No acute vascular finding. No mass or adenopathy. Reproductive:No pathologic findings. Other: Small volume pelvic ascites considered reactive in this setting. Musculoskeletal: Sacroiliac sclerosis and mild spurring with bilateral vacuum phenomenon, overall degenerative appearing. IMPRESSION: 1. Acute edematous pancreatitis. No necrosis or organized collection. 2. Cholecystectomy with prominent common bile duct (8 mm) and ampulla but  no calcified choledocholithiasis. Depending on level of suspicion for stone, an ERCP or MRCP could be obtained. Electronically Signed   By: Monte Fantasia M.D.   On: 12/15/2016 19:51   Mr Abdomen Mrcp Wo Contrast  Result Date: 12/17/2016 CLINICAL DATA:  Acute onset of nausea, vomiting and epigastric pain. History of laparoscopic cholecystectomy 14 months ago. Elevated serum lipase and liver function studies. EXAM: MRI ABDOMEN WITHOUT CONTRAST  (INCLUDING MRCP) TECHNIQUE: Multiplanar multisequence MR imaging of the abdomen was performed. Heavily T2-weighted images of the biliary and pancreatic ducts were obtained, and three-dimensional MRCP images were rendered by post processing. COMPARISON:  Abdominopelvic CT 12/15/2016. FINDINGS: Lower chest: Small left-greater-than-right pleural effusions. The visualized lower chest otherwise appears unremarkable. Hepatobiliary: Probable tiny hepatic cyst peripherally in the right lobe. No suspicious hepatic findings. Status post cholecystectomy. There is no significant biliary dilatation. However, there are 2 small calculi within the distal common bile duct, best seen on the coronal images. These measure up to 4 mm in diameter. Pancreas: The pancreas appears mildly edematous and enlarged. No focal abnormality is seen. There is retroperitoneal inflammation and ill-defined fluid, similar to recent CT. No significant pancreatic ductal dilatation. The pancreatic ductal anatomy is not well delineated. No definite pancreas divisum. Spleen: Normal in size without focal abnormality. Adrenals/Urinary Tract: Both adrenal glands appear normal. Both kidneys appear normal. No evidence of renal mass or hydronephrosis. Stomach/Bowel: No evidence of bowel wall thickening, distention or surrounding inflammatory change. Vascular/Lymphatic: There are no enlarged abdominal lymph nodes. No significant vascular findings are present. Other: Mesenteric edema and a small to moderate amount ascites  again noted. Most of the fluid is in the pelvis. No focal extraluminal fluid collection. Musculoskeletal: No acute or significant osseous findings. IMPRESSION: 1. Choledocholithiasis without significant biliary dilatation status post cholecystectomy. There are 2 small calculi in the distal common bile duct, measuring up to 4 mm in diameter. 2. Acute pancreatitis without evidence of hemorrhage or necrosis on noncontrast imaging associated mesenteric and retroperitoneal inflammation and ascites, similar to recent CT. Electronically Signed   By: Richardean Sale M.D.   On: 12/17/2016 07:51   Mr 3d Recon At Scanner  Result Date: 12/17/2016 CLINICAL DATA:  Acute onset of nausea, vomiting and epigastric pain. History of laparoscopic cholecystectomy 14 months ago. Elevated serum lipase and liver function studies. EXAM: MRI ABDOMEN WITHOUT CONTRAST  (INCLUDING MRCP) TECHNIQUE: Multiplanar multisequence MR imaging of the abdomen was performed. Heavily T2-weighted images of the biliary and pancreatic ducts were obtained, and three-dimensional MRCP images were rendered by post processing. COMPARISON:  Abdominopelvic CT 12/15/2016. FINDINGS: Lower chest: Small left-greater-than-right pleural effusions. The visualized lower chest otherwise appears unremarkable. Hepatobiliary: Probable tiny hepatic cyst peripherally  in the right lobe. No suspicious hepatic findings. Status post cholecystectomy. There is no significant biliary dilatation. However, there are 2 small calculi within the distal common bile duct, best seen on the coronal images. These measure up to 4 mm in diameter. Pancreas: The pancreas appears mildly edematous and enlarged. No focal abnormality is seen. There is retroperitoneal inflammation and ill-defined fluid, similar to recent CT. No significant pancreatic ductal dilatation. The pancreatic ductal anatomy is not well delineated. No definite pancreas divisum. Spleen: Normal in size without focal abnormality.  Adrenals/Urinary Tract: Both adrenal glands appear normal. Both kidneys appear normal. No evidence of renal mass or hydronephrosis. Stomach/Bowel: No evidence of bowel wall thickening, distention or surrounding inflammatory change. Vascular/Lymphatic: There are no enlarged abdominal lymph nodes. No significant vascular findings are present. Other: Mesenteric edema and a small to moderate amount ascites again noted. Most of the fluid is in the pelvis. No focal extraluminal fluid collection. Musculoskeletal: No acute or significant osseous findings. IMPRESSION: 1. Choledocholithiasis without significant biliary dilatation status post cholecystectomy. There are 2 small calculi in the distal common bile duct, measuring up to 4 mm in diameter. 2. Acute pancreatitis without evidence of hemorrhage or necrosis on noncontrast imaging associated mesenteric and retroperitoneal inflammation and ascites, similar to recent CT. Electronically Signed   By: Richardean Sale M.D.   On: 12/17/2016 07:51     Management plans discussed with the patient, family and they are in agreement.  CODE STATUS:     Code Status Orders  (From admission, onward)        Start     Ordered   12/16/16 0149  Full code  Continuous     12/16/16 0148    Code Status History    Date Active Date Inactive Code Status Order ID Comments User Context   This patient has a current code status but no historical code status.      TOTAL TIME TAKING CARE OF THIS PATIENT: *40* minutes.    Fritzi Mandes M.D on 12/17/2016 at 10:04 AM  Between 7am to 6pm - Pager - [phone] After 6pm go to [email or web address] - password [email or web address]  Office  [phone]  CC: Primary care physician; Letta Median, MD

## 2016-12-17 NOTE — Progress Notes (Signed)
[place] at [place] NAME: Cassidy Mathis    MR#:  [id number]  DATE OF BIRTH:  07-09-1975  SUBJECTIVE:  Came in with Abdominal pain nausea No vomiting  REVIEW OF SYSTEMS:   Review of Systems  Constitutional: Negative for chills, fever and weight loss.  HENT: Negative for ear discharge, ear pain and nosebleeds.   Eyes: Negative for blurred vision, pain and discharge.  Respiratory: Negative for sputum production, shortness of breath, wheezing and stridor.   Cardiovascular: Negative for chest pain, palpitations, orthopnea and PND.  Gastrointestinal: Positive for abdominal pain and nausea. Negative for diarrhea and vomiting.  Genitourinary: Negative for frequency and urgency.  Musculoskeletal: Negative for back pain and joint pain.  Neurological: Positive for weakness. Negative for sensory change, speech change and focal weakness.  Psychiatric/Behavioral: Negative for depression and hallucinations. The patient is not nervous/anxious.    Tolerating Diet:npo Tolerating PT: ambulatory  DRUG ALLERGIES:  No Known Allergies  VITALS:  Blood pressure 131/70, pulse 82, temperature 98.4 F (36.9 C), temperature source Oral, resp. rate 20, height 5\' 5"  (1.651 m), weight 86.6 kg (190 lb 14.4 oz), last menstrual period 12/13/2016, SpO2 100 %.  PHYSICAL EXAMINATION:   Physical Exam  GENERAL:  41 y.o.-year-old patient lying in the bed with no acute distress.  EYES: Pupils equal, round, reactive to light and accommodation. No scleral icterus. Extraocular muscles intact.  HEENT: Head atraumatic, normocephalic. Oropharynx and nasopharynx clear.  NECK:  Supple, no jugular venous distention. No thyroid enlargement, no tenderness.  LUNGS: Normal breath sounds bilaterally, no wheezing, rales, rhonchi. No use of accessory muscles of respiration.  CARDIOVASCULAR: S1, S2 normal. No murmurs, rubs, or gallops.  ABDOMEN: Soft, mild epigastric  tenderness, nondistended. Bowel sounds present. No organomegaly or mass.  EXTREMITIES: No cyanosis, clubbing or edema b/l.    NEUROLOGIC: Cranial nerves II through XII are intact. No focal Motor or sensory deficits b/l.   PSYCHIATRIC:  patient is alert and oriented x 3.  SKIN: No obvious rash, lesion, or ulcer.   LABORATORY PANEL:  CBC Recent Labs  Lab 12/16/16 1410  WBC 15.5*  HGB 12.2  HCT 36.1  PLT 326    Chemistries  Recent Labs  Lab 12/15/16 1736  12/17/16 0251  NA 138  --   --   K 3.4*  --   --   CL 107  --   --   CO2 23  --   --   GLUCOSE 84  --   --   BUN 12  --   --   CREATININE 0.81  --   --   CALCIUM 9.0  --   --   AST 335*   < > 70*  ALT 410*   < > 164*  ALKPHOS 251*   < > 157*  BILITOT 1.5*   < > 1.0   < > = values in this interval not displayed.   Cardiac Enzymes No results for input(s): TROPONINI in the last 168 hours. RADIOLOGY:  Ct Abdomen Pelvis W Contrast  Result Date: 12/15/2016 CLINICAL DATA:  Epigastric pain since last night. EXAM: CT ABDOMEN AND PELVIS WITH CONTRAST TECHNIQUE: Multidetector CT imaging of the abdomen and pelvis was performed using the standard protocol following bolus administration of intravenous contrast. CONTRAST:  166mL ISOVUE-300 IOPAMIDOL (ISOVUE-300) INJECTION 61% COMPARISON:  None. FINDINGS: Lower chest:  No acute finding. Hepatobiliary: Possible steatosis. No focal lesion.Cholecystectomy. 8 mm common bile duct, prominent given there is  pancreatitis. No calcified choledocholithiasis. Prominent angular projecting into the duodenal lumen, well seen given the duodenal distension and contrast filling. Patient does have elevated bilirubin. Pancreas: Expanded parenchyma with diffuse retroperitoneal edema centered around the pancreas. No organized collection or necrosis. No acute vascular compromise. Spleen: Negative.  No enlargement or infarct. Adrenals/Urinary Tract: Negative adrenals. No hydronephrosis or stone. Unremarkable  bladder. Stomach/Bowel:  No obstruction. No appendicitis. Vascular/Lymphatic: No acute vascular abnormality. Atheromatous wall thickening of the infrarenal abdominal aorta. No acute vascular finding. No mass or adenopathy. Reproductive:No pathologic findings. Other: Small volume pelvic ascites considered reactive in this setting. Musculoskeletal: Sacroiliac sclerosis and mild spurring with bilateral vacuum phenomenon, overall degenerative appearing. IMPRESSION: 1. Acute edematous pancreatitis. No necrosis or organized collection. 2. Cholecystectomy with prominent common bile duct (8 mm) and ampulla but no calcified choledocholithiasis. Depending on level of suspicion for stone, an ERCP or MRCP could be obtained. Electronically Signed   By: Monte Fantasia M.D.   On: 12/15/2016 19:51   Mr Abdomen Mrcp Wo Contrast  Result Date: 12/17/2016 CLINICAL DATA:  Acute onset of nausea, vomiting and epigastric pain. History of laparoscopic cholecystectomy 14 months ago. Elevated serum lipase and liver function studies. EXAM: MRI ABDOMEN WITHOUT CONTRAST  (INCLUDING MRCP) TECHNIQUE: Multiplanar multisequence MR imaging of the abdomen was performed. Heavily T2-weighted images of the biliary and pancreatic ducts were obtained, and three-dimensional MRCP images were rendered by post processing. COMPARISON:  Abdominopelvic CT 12/15/2016. FINDINGS: Lower chest: Small left-greater-than-right pleural effusions. The visualized lower chest otherwise appears unremarkable. Hepatobiliary: Probable tiny hepatic cyst peripherally in the right lobe. No suspicious hepatic findings. Status post cholecystectomy. There is no significant biliary dilatation. However, there are 2 small calculi within the distal common bile duct, best seen on the coronal images. These measure up to 4 mm in diameter. Pancreas: The pancreas appears mildly edematous and enlarged. No focal abnormality is seen. There is retroperitoneal inflammation and ill-defined  fluid, similar to recent CT. No significant pancreatic ductal dilatation. The pancreatic ductal anatomy is not well delineated. No definite pancreas divisum. Spleen: Normal in size without focal abnormality. Adrenals/Urinary Tract: Both adrenal glands appear normal. Both kidneys appear normal. No evidence of renal mass or hydronephrosis. Stomach/Bowel: No evidence of bowel wall thickening, distention or surrounding inflammatory change. Vascular/Lymphatic: There are no enlarged abdominal lymph nodes. No significant vascular findings are present. Other: Mesenteric edema and a small to moderate amount ascites again noted. Most of the fluid is in the pelvis. No focal extraluminal fluid collection. Musculoskeletal: No acute or significant osseous findings. IMPRESSION: 1. Choledocholithiasis without significant biliary dilatation status post cholecystectomy. There are 2 small calculi in the distal common bile duct, measuring up to 4 mm in diameter. 2. Acute pancreatitis without evidence of hemorrhage or necrosis on noncontrast imaging associated mesenteric and retroperitoneal inflammation and ascites, similar to recent CT. Electronically Signed   By: Richardean Sale M.D.   On: 12/17/2016 07:51   Mr 3d Recon At Scanner  Result Date: 12/17/2016 CLINICAL DATA:  Acute onset of nausea, vomiting and epigastric pain. History of laparoscopic cholecystectomy 14 months ago. Elevated serum lipase and liver function studies. EXAM: MRI ABDOMEN WITHOUT CONTRAST  (INCLUDING MRCP) TECHNIQUE: Multiplanar multisequence MR imaging of the abdomen was performed. Heavily T2-weighted images of the biliary and pancreatic ducts were obtained, and three-dimensional MRCP images were rendered by post processing. COMPARISON:  Abdominopelvic CT 12/15/2016. FINDINGS: Lower chest: Small left-greater-than-right pleural effusions. The visualized lower chest otherwise appears unremarkable. Hepatobiliary: Probable tiny hepatic cyst peripherally  in the  right lobe. No suspicious hepatic findings. Status post cholecystectomy. There is no significant biliary dilatation. However, there are 2 small calculi within the distal common bile duct, best seen on the coronal images. These measure up to 4 mm in diameter. Pancreas: The pancreas appears mildly edematous and enlarged. No focal abnormality is seen. There is retroperitoneal inflammation and ill-defined fluid, similar to recent CT. No significant pancreatic ductal dilatation. The pancreatic ductal anatomy is not well delineated. No definite pancreas divisum. Spleen: Normal in size without focal abnormality. Adrenals/Urinary Tract: Both adrenal glands appear normal. Both kidneys appear normal. No evidence of renal mass or hydronephrosis. Stomach/Bowel: No evidence of bowel wall thickening, distention or surrounding inflammatory change. Vascular/Lymphatic: There are no enlarged abdominal lymph nodes. No significant vascular findings are present. Other: Mesenteric edema and a small to moderate amount ascites again noted. Most of the fluid is in the pelvis. No focal extraluminal fluid collection. Musculoskeletal: No acute or significant osseous findings. IMPRESSION: 1. Choledocholithiasis without significant biliary dilatation status post cholecystectomy. There are 2 small calculi in the distal common bile duct, measuring up to 4 mm in diameter. 2. Acute pancreatitis without evidence of hemorrhage or necrosis on noncontrast imaging associated mesenteric and retroperitoneal inflammation and ascites, similar to recent CT. Electronically Signed   By: Richardean Sale M.D.   On: 12/17/2016 07:51   ASSESSMENT AND PLAN:  41 year old female admitted for pancreatitis. 1. Acute Pancreatitis:due to CBD stone - N.p.o.; hydrate aggressively with intravenous fluid.  - CT abdomen s/p pancreatitis -IVF, prn pain meds -GI consult appreciated. Dr Allen Norris unable to do ERCP. GI recommends transfer to [facility]. Dr Marius Ditch to call [facility]  GI -total bili 1.5  2.  Transaminitis: Suspect intra-hepatic biliary stones; CT scan shows dilated common bile duct.  - MRCP confirmed CDB stone  3.  Hypertension: Controlled; will hold oral antihypertensive medication.  Labetalol as needed.  4.  Hypokalemia: Replete potassium.  5.  DVT prophylaxis: Lovenox   Await transfer to [facility]  Case discussed with Care Management/Social Worker. Management plans discussed with the patient, family and they are in agreement.  CODE STATUS: FULL  DVT Prophylaxis: lovenox--on hold for anticipated GI procedure.  TOTAL TIME TAKING CARE OF THIS PATIENT: *25* minutes.  >50% time spent on counselling and coordination of care  POSSIBLE D/C IN *1-2 DAYS, DEPENDING ON CLINICAL CONDITION.  Note: This dictation was prepared with Dragon dictation along with smaller phrase technology. Any transcriptional errors that result from this process are unintentional.  Fritzi Mandes M.D on 12/17/2016 at 8:19 AM  Between 7am to 6pm - Pager - [phone]  After 6pm go to [email or web address] - password [email or web address] [facility]  Office  [phone]  CC: Primary care physician; Letta Median, MD

## 2016-12-17 NOTE — Discharge Instructions (Signed)
Clear liquid diet at home till ERCP completed

## 2016-12-17 NOTE — Progress Notes (Signed)
MD ordered patient to be discharged home.  Discharge instructions were reviewed with the patient and she voiced understanding.  Patient instructed on making sure Dr. Verlin Grills office would call her for follow-up ERCP.  Prescription given to the patient.  IVs were removed with catheter intact.  All patients questions were answered.  Patient having some abdominal pain but wanting to go home and get her prescription filled.  Patient left via wheelchair escorted by NT.

## 2016-12-17 NOTE — Progress Notes (Signed)
Cassidy Darby, MD [address]  [place] Math[place]  [place], [place] [address]  Main: [phone]  Fax: [phone] Pager: [phone]   Subjective: Feeling better this morning. Still have epigastric pain, Was able to drink some water, denies f/c/n/v Underwent MRCP last night  Objective: Vital signs in last 24 hours: Vitals:   12/16/16 0500 12/16/16 1300 12/16/16 2028 12/17/16 0445  BP: 122/72 131/68 136/79 131/70  Pulse: 82 95 73 82  Resp: '20 16 20 20  '$ Temp: 98.5 F (36.9 C) 98.6 F (37 C) 98.9 F (37.2 C) 98.4 F (36.9 C)  TempSrc: Oral Oral Oral Oral  SpO2: 100% 99% 99% 100%  Weight:    190 lb 14.4 oz (86.6 kg)  Height:       Weight change: 10 lb 14.4 oz (4.944 kg)  Intake/Output Summary (Last 24 hours) at 12/17/2016 0925 Last data filed at 12/17/2016 0841 Gross per 24 hour  Intake 2580 ml  Output 850 ml  Net 1730 ml     Exam: Heart:: Regular rate and rhythm, S1S2 present or without murmur or extra heart sounds Lungs: clear to auscultation Abdomen: soft, mild epigastric tenderness, normal bowel sounds   Lab Results: CBC Latest Ref Rng & Units 12/16/2016 12/15/2016 10/04/2015  WBC 3.6 - 11.0 K/uL 15.5(H) 13.3(H) 7.7  Hemoglobin 12.0 - 16.0 g/dL 12.2 14.4 13.4  Hematocrit 35.0 - 47.0 % 36.1 41.8 38.6  Platelets 150 - 440 K/uL 326 387 462(H)   Hepatic Function Latest Ref Rng & Units 12/17/2016 12/16/2016 12/15/2016  Total Protein 6.5 - 8.1 g/dL 5.9(L) 6.4(L) 7.4  Albumin 3.5 - 5.0 g/dL 3.0(L) 3.2(L) 4.0  AST 15 - 41 U/L 70(H) 102(H) 335(H)  ALT 14 - 54 U/L 164(H) 204(H) 410(H)  Alk Phosphatase 38 - 126 U/L 157(H) 182(H) 251(H)  Total Bilirubin 0.3 - 1.2 mg/dL 1.0 1.1 1.5(H)  Bilirubin, Direct 0.1 - 0.5 mg/dL 0.2 0.2 -   BMP Latest Ref Rng & Units 12/15/2016 10/31/2015 10/24/2015  Glucose 65 - 99 mg/dL 84 93 88  BUN 6 - 20 mg/dL '12 14 11  '$ Creatinine 0.44 - 1.00 mg/dL 0.81 0.92 0.99  Sodium 135 - 145 mmol/L 138 138 138  Potassium 3.5 - 5.1 mmol/L  3.4(L) 3.9 3.8  Chloride 101 - 111 mmol/L 107 106 108  CO2 22 - 32 mmol/L '23 27 29  '$ Calcium 8.9 - 10.3 mg/dL 9.0 8.8(L) 8.9   Micro Results: No results found for this or any previous visit (from the past 240 hour(s)). Studies/Results: Ct Abdomen Pelvis W Contrast  Result Date: 12/15/2016 CLINICAL DATA:  Epigastric pain since last night. EXAM: CT ABDOMEN AND PELVIS WITH CONTRAST TECHNIQUE: Multidetector CT imaging of the abdomen and pelvis was performed using the standard protocol following bolus administration of intravenous contrast. CONTRAST:  172m ISOVUE-300 IOPAMIDOL (ISOVUE-300) INJECTION 61% COMPARISON:  None. FINDINGS: Lower chest:  No acute finding. Hepatobiliary: Possible steatosis. No focal lesion.Cholecystectomy. 8 mm common bile duct, prominent given there is pancreatitis. No calcified choledocholithiasis. Prominent angular projecting into the duodenal lumen, well seen given the duodenal distension and contrast filling. Patient does have elevated bilirubin. Pancreas: Expanded parenchyma with diffuse retroperitoneal edema centered around the pancreas. No organized collection or necrosis. No acute vascular compromise. Spleen: Negative.  No enlargement or infarct. Adrenals/Urinary Tract: Negative adrenals. No hydronephrosis or stone. Unremarkable bladder. Stomach/Bowel:  No obstruction. No appendicitis. Vascular/Lymphatic: No acute vascular abnormality. Atheromatous wall thickening of the infrarenal abdominal aorta. No acute vascular finding. No mass  or adenopathy. Reproductive:No pathologic findings. Other: Small volume pelvic ascites considered reactive in this setting. Musculoskeletal: Sacroiliac sclerosis and mild spurring with bilateral vacuum phenomenon, overall degenerative appearing. IMPRESSION: 1. Acute edematous pancreatitis. No necrosis or organized collection. 2. Cholecystectomy with prominent common bile duct (8 mm) and ampulla but no calcified choledocholithiasis. Depending on  level of suspicion for stone, an ERCP or MRCP could be obtained. Electronically Signed   By: Monte Fantasia M.D.   On: 12/15/2016 19:51   Mr Abdomen Mrcp Wo Contrast  Result Date: 12/17/2016 CLINICAL DATA:  Acute onset of nausea, vomiting and epigastric pain. History of laparoscopic cholecystectomy 14 months ago. Elevated serum lipase and liver function studies. EXAM: MRI ABDOMEN WITHOUT CONTRAST  (INCLUDING MRCP) TECHNIQUE: Multiplanar multisequence MR imaging of the abdomen was performed. Heavily T2-weighted images of the biliary and pancreatic ducts were obtained, and three-dimensional MRCP images were rendered by post processing. COMPARISON:  Abdominopelvic CT 12/15/2016. FINDINGS: Lower chest: Small left-greater-than-right pleural effusions. The visualized lower chest otherwise appears unremarkable. Hepatobiliary: Probable tiny hepatic cyst peripherally in the right lobe. No suspicious hepatic findings. Status post cholecystectomy. There is no significant biliary dilatation. However, there are 2 small calculi within the distal common bile duct, best seen on the coronal images. These measure up to 4 mm in diameter. Pancreas: The pancreas appears mildly edematous and enlarged. No focal abnormality is seen. There is retroperitoneal inflammation and ill-defined fluid, similar to recent CT. No significant pancreatic ductal dilatation. The pancreatic ductal anatomy is not well delineated. No definite pancreas divisum. Spleen: Normal in size without focal abnormality. Adrenals/Urinary Tract: Both adrenal glands appear normal. Both kidneys appear normal. No evidence of renal mass or hydronephrosis. Stomach/Bowel: No evidence of bowel wall thickening, distention or surrounding inflammatory change. Vascular/Lymphatic: There are no enlarged abdominal lymph nodes. No significant vascular findings are present. Other: Mesenteric edema and a small to moderate amount ascites again noted. Most of the fluid is in the  pelvis. No focal extraluminal fluid collection. Musculoskeletal: No acute or significant osseous findings. IMPRESSION: 1. Choledocholithiasis without significant biliary dilatation status post cholecystectomy. There are 2 small calculi in the distal common bile duct, measuring up to 4 mm in diameter. 2. Acute pancreatitis without evidence of hemorrhage or necrosis on noncontrast imaging associated mesenteric and retroperitoneal inflammation and ascites, similar to recent CT. Electronically Signed   By: Richardean Sale M.D.   On: 12/17/2016 07:51   Mr 3d Recon At Scanner  Result Date: 12/17/2016 CLINICAL DATA:  Acute onset of nausea, vomiting and epigastric pain. History of laparoscopic cholecystectomy 14 months ago. Elevated serum lipase and liver function studies. EXAM: MRI ABDOMEN WITHOUT CONTRAST  (INCLUDING MRCP) TECHNIQUE: Multiplanar multisequence MR imaging of the abdomen was performed. Heavily T2-weighted images of the biliary and pancreatic ducts were obtained, and three-dimensional MRCP images were rendered by post processing. COMPARISON:  Abdominopelvic CT 12/15/2016. FINDINGS: Lower chest: Small left-greater-than-right pleural effusions. The visualized lower chest otherwise appears unremarkable. Hepatobiliary: Probable tiny hepatic cyst peripherally in the right lobe. No suspicious hepatic findings. Status post cholecystectomy. There is no significant biliary dilatation. However, there are 2 small calculi within the distal common bile duct, best seen on the coronal images. These measure up to 4 mm in diameter. Pancreas: The pancreas appears mildly edematous and enlarged. No focal abnormality is seen. There is retroperitoneal inflammation and ill-defined fluid, similar to recent CT. No significant pancreatic ductal dilatation. The pancreatic ductal anatomy is not well delineated. No definite pancreas divisum. Spleen: Normal in  size without focal abnormality. Adrenals/Urinary Tract: Both adrenal  glands appear normal. Both kidneys appear normal. No evidence of renal mass or hydronephrosis. Stomach/Bowel: No evidence of bowel wall thickening, distention or surrounding inflammatory change. Vascular/Lymphatic: There are no enlarged abdominal lymph nodes. No significant vascular findings are present. Other: Mesenteric edema and a small to moderate amount ascites again noted. Most of the fluid is in the pelvis. No focal extraluminal fluid collection. Musculoskeletal: No acute or significant osseous findings. IMPRESSION: 1. Choledocholithiasis without significant biliary dilatation status post cholecystectomy. There are 2 small calculi in the distal common bile duct, measuring up to 4 mm in diameter. 2. Acute pancreatitis without evidence of hemorrhage or necrosis on noncontrast imaging associated mesenteric and retroperitoneal inflammation and ascites, similar to recent CT. Electronically Signed   By: Richardean Sale M.D.   On: 12/17/2016 07:51   Medications: I have reviewed the patient's current medications. Scheduled Meds: . docusate sodium  100 mg Oral BID  . LORazepam  1 mg Intravenous Once  . pantoprazole (PROTONIX) IV  40 mg Intravenous Q12H   Continuous Infusions: . sodium chloride 75 mL/hr at 12/17/16 0841   PRN Meds:.acetaminophen **OR** acetaminophen, labetalol, morphine injection, ondansetron **OR** ondansetron (ZOFRAN) IV, oxyCODONE-acetaminophen, prochlorperazine   Assessment: Active Problems:   Pancreatitis  Cassidy Mathis is a 41 y.o. female with presents with 1 day history of epigastric pain, nausea,lap chole secondary to symptomatic cholelithiasis in 09/2015 vomiting found to have acute pancreatitis and abnormal LFTs with cholestatic picture.  CT revealed prominent CBD and ampulla.  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  She does not have evidence of ascending cholangitis or necrotizing pancreatitis. She also  has chronic chest pain, heart burn on chronic NSAID use. MRCP confirmed distal CBD stones. LFTs have significantly improved. T Bili normal. Lipase has normalized  Plan: - There is no ERCP coverage during weekend at [facility] on call GI physician Dr Therisa Doyne who already has 3 ERCPs scheduled for today, she is not available to do today - Given that LFTs improved and she is clinically better, discussed with pt that we will discharge her today and schedule out pt ERCP early next week with Dr Allen Norris. She will also need EGD - She expressed understanding of the plan and told her to go to ER if her symptoms get worse again - Avoid NSAIDs   LOS: 1 day   [place] Mathis 12/17/2016, 9:25 AM

## 2016-12-18 LAB — HEPATITIS PANEL, ACUTE
HCV Ab: 0.1 s/co ratio (ref 0.0–0.9)
HEP B S AG: NEGATIVE
Hep A IgM: NEGATIVE
Hep B C IgM: NEGATIVE

## 2016-12-20 ENCOUNTER — Other Ambulatory Visit: Payer: Self-pay

## 2016-12-20 ENCOUNTER — Telehealth: Payer: Self-pay | Admitting: Gastroenterology

## 2016-12-20 ENCOUNTER — Encounter: Payer: Self-pay | Admitting: *Deleted

## 2016-12-20 DIAGNOSIS — K8050 Calculus of bile duct without cholangitis or cholecystitis without obstruction: Secondary | ICD-10-CM

## 2016-12-20 NOTE — Telephone Encounter (Signed)
Patient states she was seen in hosp and was told someone would call her to sch ERCP this week. Please advise.

## 2016-12-20 NOTE — Telephone Encounter (Signed)
Pt scheduled for an ERCP at [facility] with [place]. Pt has been notified of instructions, location and time.

## 2016-12-21 ENCOUNTER — Ambulatory Visit (HOSPITAL_BASED_OUTPATIENT_CLINIC_OR_DEPARTMENT_OTHER)
Admission: RE | Admit: 2016-12-21 | Discharge: 2016-12-21 | Disposition: A | Discharge status: Home / Self Care | Payer: Medicaid Other | Source: Ambulatory Visit | Attending: Gastroenterology | Admitting: Gastroenterology

## 2016-12-21 ENCOUNTER — Encounter: Payer: Self-pay | Admitting: *Deleted

## 2016-12-21 ENCOUNTER — Ambulatory Visit: Payer: Medicaid Other

## 2016-12-21 ENCOUNTER — Encounter
Admission: RE | Disposition: A | Discharge status: Home / Self Care | Payer: Self-pay | Source: Ambulatory Visit | Attending: Gastroenterology

## 2016-12-21 ENCOUNTER — Ambulatory Visit: Payer: Medicaid Other | Admitting: Anesthesiology

## 2016-12-21 DIAGNOSIS — N189 Chronic kidney disease, unspecified: Secondary | ICD-10-CM | POA: Insufficient documentation

## 2016-12-21 DIAGNOSIS — K219 Gastro-esophageal reflux disease without esophagitis: Secondary | ICD-10-CM | POA: Insufficient documentation

## 2016-12-21 DIAGNOSIS — I129 Hypertensive chronic kidney disease with stage 1 through stage 4 chronic kidney disease, or unspecified chronic kidney disease: Secondary | ICD-10-CM

## 2016-12-21 DIAGNOSIS — F17210 Nicotine dependence, cigarettes, uncomplicated: Secondary | ICD-10-CM

## 2016-12-21 DIAGNOSIS — K8050 Calculus of bile duct without cholangitis or cholecystitis without obstruction: Secondary | ICD-10-CM

## 2016-12-21 HISTORY — PX: ERCP: SHX5425

## 2016-12-21 LAB — POCT PREGNANCY, URINE: Preg Test, Ur: NEGATIVE

## 2016-12-21 SURGERY — ERCP, WITH INTERVENTION IF INDICATED
Anesthesia: General

## 2016-12-21 MED ORDER — SUCCINYLCHOLINE CHLORIDE 20 MG/ML IJ SOLN
INTRAMUSCULAR | Status: DC | PRN
  Administered 2016-12-21: 100 mg via INTRAVENOUS

## 2016-12-21 MED ORDER — INDOMETHACIN 50 MG RE SUPP
RECTAL | Status: DC | PRN
  Administered 2016-12-21: 100 mg via RECTAL

## 2016-12-21 MED ORDER — MIDAZOLAM HCL 2 MG/2ML IJ SOLN
INTRAMUSCULAR | Status: AC
  Filled 2016-12-21: qty 2

## 2016-12-21 MED ORDER — DEXAMETHASONE SODIUM PHOSPHATE 10 MG/ML IJ SOLN
INTRAMUSCULAR | Status: AC
  Filled 2016-12-21: qty 1

## 2016-12-21 MED ORDER — FENTANYL CITRATE (PF) 100 MCG/2ML IJ SOLN: 25.0000 ug | INTRAMUSCULAR | Status: DC | PRN

## 2016-12-21 MED ORDER — FENTANYL CITRATE (PF) 100 MCG/2ML IJ SOLN
INTRAMUSCULAR | Status: AC
  Filled 2016-12-21: qty 2

## 2016-12-21 MED ORDER — LIDOCAINE HCL (CARDIAC) 20 MG/ML IV SOLN
INTRAVENOUS | Status: DC | PRN
  Administered 2016-12-21: 50 mg via INTRAVENOUS

## 2016-12-21 MED ORDER — PROPOFOL 10 MG/ML IV BOLUS
INTRAVENOUS | Status: DC | PRN
  Administered 2016-12-21: 150 mg via INTRAVENOUS
  Administered 2016-12-21: 50 mg via INTRAVENOUS

## 2016-12-21 MED ORDER — FENTANYL CITRATE (PF) 100 MCG/2ML IJ SOLN
INTRAMUSCULAR | Status: DC | PRN
  Administered 2016-12-21: 50 ug via INTRAVENOUS

## 2016-12-21 MED ORDER — DEXAMETHASONE SODIUM PHOSPHATE 10 MG/ML IJ SOLN
INTRAMUSCULAR | Status: DC | PRN
  Administered 2016-12-21: 10 mg via INTRAVENOUS

## 2016-12-21 MED ORDER — ONDANSETRON HCL 4 MG/2ML IJ SOLN: 4.0000 mg | Freq: Once | INTRAMUSCULAR | Status: DC | PRN

## 2016-12-21 MED ORDER — SUCCINYLCHOLINE CHLORIDE 20 MG/ML IJ SOLN
INTRAMUSCULAR | Status: AC
Start: 2016-12-21 — End: 2016-12-21
  Filled 2016-12-21: qty 1

## 2016-12-21 MED ORDER — INDOMETHACIN 50 MG RE SUPP
RECTAL | Status: AC
  Administered 2016-12-21: 100 mg
  Filled 2016-12-21: qty 2

## 2016-12-21 MED ORDER — ONDANSETRON HCL 4 MG/2ML IJ SOLN
INTRAMUSCULAR | Status: DC | PRN
  Administered 2016-12-21: 4 mg via INTRAVENOUS

## 2016-12-21 MED ORDER — MIDAZOLAM HCL 2 MG/2ML IJ SOLN
INTRAMUSCULAR | Status: DC | PRN
  Administered 2016-12-21: 2 mg via INTRAVENOUS

## 2016-12-21 MED ORDER — LIDOCAINE HCL (PF) 2 % IJ SOLN
INTRAMUSCULAR | Status: AC
  Filled 2016-12-21: qty 10

## 2016-12-21 MED ORDER — PROPOFOL 500 MG/50ML IV EMUL
INTRAVENOUS | Status: AC
  Filled 2016-12-21: qty 50

## 2016-12-21 MED ORDER — SODIUM CHLORIDE 0.9 % IV SOLN
INTRAVENOUS | Status: DC
  Administered 2016-12-21: 1000 mL via INTRAVENOUS

## 2016-12-21 MED ORDER — ONDANSETRON HCL 4 MG/2ML IJ SOLN
INTRAMUSCULAR | Status: AC
  Filled 2016-12-21: qty 2

## 2016-12-21 NOTE — Op Note (Addendum)
[facility] Gastroenterology Patient Name: Cassidy Mathis Procedure Date: 12/21/2016 11:41 AM MRN: [id number] Account #: [id number] Date of Birth: 19-Jan-1976 Admit Type: Outpatient Age: 41 Room: [facility] ENDO ROOM 4 Gender: Female Note Status: Finalized Procedure:            ERCP Indications:          Bile duct stone(s) Providers:            [name] MD, MD Medicines:            Propofol per Anesthesia Complications:        No immediate complications. Procedure:            Pre-Anesthesia Assessment:                       - Prior to the procedure, a History and Physical was                        performed, and patient medications and allergies were                        reviewed. The patient's tolerance of previous                        anesthesia was also reviewed. The risks and benefits of                        the procedure and the sedation options and risks were                        discussed with the patient. All questions were                        answered, and informed consent was obtained. Prior                        Anticoagulants: The patient has taken no previous                        anticoagulant or antiplatelet agents. ASA Grade                        Assessment: II - A patient with mild systemic disease.                        After reviewing the risks and benefits, the patient was                        deemed in satisfactory condition to undergo the                        procedure.                       After obtaining informed consent, the scope was passed                        under direct vision. Throughout the procedure, the                        patient's blood pressure, pulse, and  oxygen saturations                        were monitored continuously. The Endoscope was                        introduced through the mouth, and used to inject                        contrast into and used to inject contrast into the bile              duct. The ERCP was accomplished without difficulty. The                        patient tolerated the procedure well. Findings:      A scout film of the abdomen was obtained. Surgical clips, consistent       with previous cholecystectomy, were seen in the area of the cystic duct.       The esophagus was successfully intubated under direct vision. The scope       was advanced to a normal major papilla in the descending duodenum       without detailed examination of the pharynx, larynx and associated       structures, and upper GI tract. The upper GI tract was grossly normal.       The bile duct was deeply cannulated with the short-nosed traction       sphincterotome. Contrast was injected. I personally interpreted the bile       duct images. There was brisk flow of contrast through the ducts. Image       quality was excellent. Contrast extended to the entire biliary tree. The       lower third of the main bile duct contained one stone, which was 2 mm in       diameter. A wire was passed into the biliary tree. An 8 mm biliary       sphincterotomy was made with a traction (standard) sphincterotome using       ERBE electrocautery. There was no post-sphincterotomy bleeding. The       biliary tree was swept with a 15 mm balloon starting at the bifurcation.       One stone was removed. No stones remained. Impression:           - Choledocholithiasis was found. Complete removal was                        accomplished by biliary sphincterotomy and balloon                        extraction.                       - A biliary sphincterotomy was performed.                       - The biliary tree was swept. Recommendation:       - Watch for pancreatitis, bleeding, perforation, and                        cholangitis.                       -  Clear liquid diet today.                       - Discharge patient to home.                       - Continue present medications. Procedure Code(s):     --- Professional ---                       [id number], Endoscopic retrograde cholangiopancreatography                        (ERCP); with removal of calculi/debris from                        biliary/pancreatic duct(s)                       43262, Endoscopic retrograde cholangiopancreatography                        (ERCP); with sphincterotomy/papillotomy                       [phone], Endoscopic catheterization of the biliary ductal                        system, radiological supervision and interpretation Diagnosis Code(s):    --- Professional ---                       K80.50, Calculus of bile duct without cholangitis or                        cholecystitis without obstruction CPT copyright 2016 American Medical Association. All rights reserved. The codes documented in this report are preliminary and upon coder review may  be revised to meet current compliance requirements. [name] MD, MD 12/21/2016 12:19:09 PM This report has been signed electronically. Number of Addenda: 0 Note Initiated On: 12/21/2016 11:41 AM      [facility]

## 2016-12-21 NOTE — H&P (Signed)
Cassidy Lame, MD [address] [place], [place] [address] Phone:[phone] Fax : [phone]  Primary Care Physician:  Cassidy Median, MD Primary Gastroenterologist:  Dr. Allen Mathis  Pre-Procedure History & Physical: HPI:  Cassidy Mathis is a 41 y.o. female is here for an ERCP.   Past Medical History:  Diagnosis Date  . Allergy    seasonal  . Anemia   . Chronic kidney disease    KIDNEY STONES CURRENTLY  . Depression   . GERD (gastroesophageal reflux disease)    NO MEDS  . Headache    MIGRAINES  . Hypertension    PT STATES SHE IS SUPPOSED TO BE TAKING LISINOPRIL-HCTZ BUT HAS BEEN OUT "FOR A WHILE" NEEDS TO GET ANOTHER PRESCRIPTION FROM HER PCP  . Pilonidal cyst    TOOK ANTIBIOTIC THIS MONTH AND IT IS RESOLVED    Past Surgical History:  Procedure Laterality Date  . CHOLECYSTECTOMY  10/21/2015   Procedure: LAPAROSCOPIC CHOLECYSTECTOMY WITH INTRAOPERATIVE CHOLANGIOGRAM;  Surgeon: Jules Husbands, MD;  Location: [facility] ORS;  Service: General;;  . CYST EXCISION Left 1998   wrist  . CYST EXCISION  1998   tongue  . EPIGASTRIC HERNIA REPAIR N/A 04/18/2015   Procedure: HERNIA REPAIR EPIGASTRIC ADULT;  Surgeon: Jules Husbands, MD;  Location: [facility] ORS;  Service: General;  Laterality: N/A;  . HERNIA REPAIR    . TUBAL LIGATION      Prior to Admission medications   Medication Sig Start Date End Date Taking? Authorizing Provider  lisinopril-hydrochlorothiazide (PRINZIDE,ZESTORETIC) 20-25 MG tablet Take 1 tablet by mouth daily. 10/13/15   [provider]  ondansetron (ZOFRAN) 4 MG tablet Take 1 tablet (4 mg total) by mouth every 8 (eight) hours as needed for nausea or vomiting. 12/17/16   Fritzi Mandes, MD  oxyCODONE-acetaminophen (PERCOCET/ROXICET) 5-325 MG tablet Take 1-2 tablets by mouth every 4 (four) hours as needed for moderate pain. 12/17/16   Fritzi Mandes, MD    Allergies as of 12/20/2016  . (No Known Allergies)    Family History  Problem Relation  Age of Onset  . Hypertension Mother   . Kidney Stones Mother   . Gallstones Mother   . Hypertension Father   . Gout Father   . Cancer Paternal Uncle        lung  . Cancer Maternal Grandmother        not sure what type of cancer  . Cancer Maternal Grandfather        not sure what type of cancer  . Cancer Paternal Grandmother        not sure what type of cancer  . Cancer Paternal Grandfather        not sure what type of cancer    Social History   Socioeconomic History  . Marital status: Single    Spouse name: Not on file  . Number of children: Not on file  . Years of education: Not on file  . Highest education level: Not on file  Social Needs  . Financial resource strain: Not on file  . Food insecurity - worry: Not on file  . Food insecurity - inability: Not on file  . Transportation needs - medical: Not on file  . Transportation needs - non-medical: Not on file  Occupational History  . Not on file  Tobacco Use  . Smoking status: Current Every Day Smoker    Packs/day: 0.50    Years: 12.00    Pack years: 6.00  Types: Cigarettes  . Smokeless tobacco: Never Used  Substance and Sexual Activity  . Alcohol use: Yes    Alcohol/week: 0.0 oz    Comment: Barely  . Drug use: Yes    Types: Marijuana    Comment: "not often"  . Sexual activity: Not on file  Other Topics Concern  . Not on file  Social History Narrative  . Not on file    Review of Systems: See HPI, otherwise negative ROS  Physical Exam: BP (!) 147/88   Pulse 68   Temp (!) 97.1 F (36.2 C) (Tympanic)   Resp 18   Ht 5\' 5"  (1.651 m)   Wt 180 lb (81.6 kg)   LMP 12/13/2016 (Approximate) Comment: neg preg test 12/15/16  SpO2 100%   BMI 29.95 kg/m  General:   Alert,  pleasant and cooperative in NAD Head:  Normocephalic and atraumatic. Neck:  Supple; no masses or thyromegaly. Lungs:  Clear throughout to auscultation.    Heart:  Regular rate and rhythm. Abdomen:  Soft, nontender and nondistended.  Normal bowel sounds, without guarding, and without rebound.   Neurologic:  Alert and  oriented x4;  grossly normal neurologically.  Impression/Plan: Cassidy Mathis is here for an ERCP to be performed for CBD stone  Risks, benefits, limitations, and alternatives regarding  ERCP have been reviewed with the patient.  Questions have been answered.  All parties agreeable.   Cassidy Lame, MD  12/21/2016, 11:38 AM

## 2016-12-21 NOTE — Anesthesia Post-op Follow-up Note (Signed)
Anesthesia QCDR form completed.        

## 2016-12-21 NOTE — Anesthesia Postprocedure Evaluation (Signed)
Anesthesia Post Note  Patient: Cassidy Mathis  Procedure(s) Performed: ENDOSCOPIC RETROGRADE CHOLANGIOPANCREATOGRAPHY (ERCP) (N/A )  Patient location during evaluation: PACU Anesthesia Type: General Level of consciousness: awake Pain management: pain level controlled Vital Signs Assessment: post-procedure vital signs reviewed and stable Respiratory status: nonlabored ventilation Cardiovascular status: stable Anesthetic complications: no     Last Vitals:  Vitals:   12/21/16 1302 12/21/16 1312  BP:  134/84  Pulse: 72 68  Resp:  16  Temp: 36.6 C   SpO2:  99%    Last Pain:  Vitals:   12/21/16 1232  TempSrc:   PainSc: Asleep                 VAN STAVEREN,Iver Fehrenbach

## 2016-12-21 NOTE — Anesthesia Procedure Notes (Signed)
Procedure Name: Intubation Date/Time: 12/21/2016 11:54 AM Performed by: Eben Burow, CRNA Pre-anesthesia Checklist: Patient identified, Emergency Drugs available, Suction available and Patient being monitored Patient Re-evaluated:Patient Re-evaluated prior to induction Oxygen Delivery Method: Circle system utilized Preoxygenation: Pre-oxygenation with 100% oxygen Induction Type: IV induction Ventilation: Mask ventilation without difficulty Laryngoscope Size: Miller and 2 Grade View: Grade I Tube type: Oral Tube size: 7.0 mm Number of attempts: 1 Airway Equipment and Method: Stylet Placement Confirmation: ETT inserted through vocal cords under direct vision,  positive ETCO2 and breath sounds checked- equal and bilateral Secured at: 21 cm Tube secured with: Tape Dental Injury: Teeth and Oropharynx as per pre-operative assessment

## 2016-12-21 NOTE — Transfer of Care (Signed)
Immediate Anesthesia Transfer of Care Note  Patient: Cassidy Mathis  Procedure(s) Performed: ENDOSCOPIC RETROGRADE CHOLANGIOPANCREATOGRAPHY (ERCP) (N/A )  Patient Location: PACU  Anesthesia Type:General  Level of Consciousness: drowsy  Airway & Oxygen Therapy: Patient Spontanous Breathing and Patient connected to face mask oxygen  Post-op Assessment: Report given to RN and Post -op Vital signs reviewed and stable  Post vital signs: Reviewed and stable  Last Vitals:  Vitals:   12/21/16 1046 12/21/16 1232  BP: (!) 147/88   Pulse: 68   Resp: 18   Temp: (!) 36.2 C (P) 36.6 C  SpO2: 100%     Last Pain:  Vitals:   12/21/16 1232  TempSrc:   PainSc: (P) Asleep         Complications: No apparent anesthesia complications

## 2016-12-21 NOTE — Anesthesia Preprocedure Evaluation (Signed)
Anesthesia Evaluation  Patient identified by MRN, date of birth, ID band Patient awake    Reviewed: Allergy & Precautions, NPO status , Patient's Chart, lab work & pertinent test results  Airway Mallampati: II       Dental  (+) Teeth Intact   Pulmonary Current Smoker,    breath sounds clear to auscultation       Cardiovascular Exercise Tolerance: Good hypertension, Pt. on medications  Rhythm:Regular Rate:Normal     Neuro/Psych    GI/Hepatic Neg liver ROS, GERD  Medicated,  Endo/Other  negative endocrine ROS  Renal/GU negative Renal ROS     Musculoskeletal   Abdominal Normal abdominal exam  (+)   Peds negative pediatric ROS (+)  Hematology  (+) anemia ,   Anesthesia Other Findings   Reproductive/Obstetrics                             Anesthesia Physical Anesthesia Plan  ASA: II  Anesthesia Plan: General   Post-op Pain Management:    Induction: Intravenous  PONV Risk Score and Plan:   Airway Management Planned: Oral ETT  Additional Equipment:   Intra-op Plan:   Post-operative Plan: Extubation in OR  Informed Consent: I have reviewed the patients History and Physical, chart, labs and discussed the procedure including the risks, benefits and alternatives for the proposed anesthesia with the patient or authorized representative who has indicated his/her understanding and acceptance.     Plan Discussed with: CRNA  Anesthesia Plan Comments:         Anesthesia Quick Evaluation

## 2016-12-22 ENCOUNTER — Inpatient Hospital Stay
Admission: EM | Admit: 2016-12-22 | Discharge: 2016-12-23 | DRG: 393 | Disposition: A | Discharge status: Home / Self Care | Payer: Medicaid Other | Attending: Internal Medicine | Admitting: Internal Medicine

## 2016-12-22 ENCOUNTER — Inpatient Hospital Stay: Payer: Medicaid Other

## 2016-12-22 ENCOUNTER — Other Ambulatory Visit: Payer: Self-pay

## 2016-12-22 DIAGNOSIS — I129 Hypertensive chronic kidney disease with stage 1 through stage 4 chronic kidney disease, or unspecified chronic kidney disease: Secondary | ICD-10-CM | POA: Diagnosis present

## 2016-12-22 DIAGNOSIS — X58XXXA Exposure to other specified factors, initial encounter: Secondary | ICD-10-CM | POA: Diagnosis present

## 2016-12-22 DIAGNOSIS — K219 Gastro-esophageal reflux disease without esophagitis: Secondary | ICD-10-CM | POA: Diagnosis present

## 2016-12-22 DIAGNOSIS — F17210 Nicotine dependence, cigarettes, uncomplicated: Secondary | ICD-10-CM | POA: Diagnosis present

## 2016-12-22 DIAGNOSIS — K859 Acute pancreatitis without necrosis or infection, unspecified: Secondary | ICD-10-CM | POA: Diagnosis present

## 2016-12-22 DIAGNOSIS — Z91128 Patient's intentional underdosing of medication regimen for other reason: Secondary | ICD-10-CM

## 2016-12-22 DIAGNOSIS — K8050 Calculus of bile duct without cholangitis or cholecystitis without obstruction: Secondary | ICD-10-CM | POA: Diagnosis present

## 2016-12-22 DIAGNOSIS — K858 Other acute pancreatitis without necrosis or infection: Secondary | ICD-10-CM | POA: Diagnosis not present

## 2016-12-22 DIAGNOSIS — M189 Osteoarthritis of first carpometacarpal joint, unspecified: Secondary | ICD-10-CM | POA: Diagnosis present

## 2016-12-22 DIAGNOSIS — T464X6A Underdosing of angiotensin-converting-enzyme inhibitors, initial encounter: Secondary | ICD-10-CM | POA: Diagnosis present

## 2016-12-22 DIAGNOSIS — K9189 Other postprocedural complications and disorders of digestive system: Secondary | ICD-10-CM | POA: Diagnosis not present

## 2016-12-22 DIAGNOSIS — Z79899 Other long term (current) drug therapy: Secondary | ICD-10-CM | POA: Diagnosis not present

## 2016-12-22 LAB — CBC WITH DIFFERENTIAL/PLATELET
Basophils Absolute: 0.1 10*3/uL (ref 0–0.1)
Basophils Relative: 0 %
Eosinophils Absolute: 0 10*3/uL (ref 0–0.7)
Eosinophils Relative: 0 %
HCT: 43 % (ref 35.0–47.0)
Hemoglobin: 14.4 g/dL (ref 12.0–16.0)
Lymphocytes Relative: 17 %
Lymphs Abs: 2.6 10*3/uL (ref 1.0–3.6)
MCH: 29.9 pg (ref 26.0–34.0)
MCHC: 33.6 g/dL (ref 32.0–36.0)
MCV: 89 fL (ref 80.0–100.0)
Monocytes Absolute: 1.2 10*3/uL — ABNORMAL HIGH (ref 0.2–0.9)
Monocytes Relative: 8 %
Neutro Abs: 11.9 10*3/uL — ABNORMAL HIGH (ref 1.4–6.5)
Neutrophils Relative %: 75 %
Platelets: 530 10*3/uL — ABNORMAL HIGH (ref 150–440)
RBC: 4.83 MIL/uL (ref 3.80–5.20)
RDW: 13.3 % (ref 11.5–14.5)
WBC: 15.7 10*3/uL — ABNORMAL HIGH (ref 3.6–11.0)

## 2016-12-22 LAB — COMPREHENSIVE METABOLIC PANEL
ALT: 74 U/L — ABNORMAL HIGH (ref 14–54)
AST: 37 U/L (ref 15–41)
Albumin: 4 g/dL (ref 3.5–5.0)
Alkaline Phosphatase: 234 U/L — ABNORMAL HIGH (ref 38–126)
Anion gap: 12 (ref 5–15)
BUN: 10 mg/dL (ref 6–20)
CO2: 24 mmol/L (ref 22–32)
Calcium: 9.6 mg/dL (ref 8.9–10.3)
Chloride: 102 mmol/L (ref 101–111)
Creatinine, Ser: 0.91 mg/dL (ref 0.44–1.00)
GFR calc Af Amer: >60 mL/min (ref >60)
GFR calc non Af Amer: >60 mL/min (ref >60)
Glucose, Bld: 110 mg/dL — ABNORMAL HIGH (ref 65–99)
Potassium: 3.6 mmol/L (ref 3.5–5.1)
Sodium: 138 mmol/L (ref 135–145)
Total Bilirubin: 0.7 mg/dL (ref 0.3–1.2)
Total Protein: 8.1 g/dL (ref 6.5–8.1)

## 2016-12-22 LAB — LIPASE, BLOOD: Lipase: 535 U/L — ABNORMAL HIGH (ref 11–51)

## 2016-12-22 MED ORDER — ONDANSETRON HCL 4 MG/2ML IJ SOLN
4.0000 mg | Freq: Four times a day (QID) | INTRAMUSCULAR | Status: DC | PRN
  Administered 2016-12-22 (×2): 4 mg via INTRAVENOUS
  Filled 2016-12-22 (×2): qty 2

## 2016-12-22 MED ORDER — KETOROLAC TROMETHAMINE 30 MG/ML IJ SOLN
30.0000 mg | INTRAMUSCULAR | Status: DC | PRN
  Administered 2016-12-22 (×3): 30 mg via INTRAVENOUS
  Filled 2016-12-22 (×3): qty 1

## 2016-12-22 MED ORDER — ONDANSETRON HCL 4 MG/2ML IJ SOLN
INTRAMUSCULAR | Status: AC
  Administered 2016-12-22: 06:00:00
  Filled 2016-12-22: qty 2

## 2016-12-22 MED ORDER — SODIUM CHLORIDE 0.9 % IV SOLN
INTRAVENOUS | Status: DC
  Administered 2016-12-22: 10:00:00 via INTRAVENOUS

## 2016-12-22 MED ORDER — HYDROCODONE-ACETAMINOPHEN 5-325 MG PO TABS
1.0000 | ORAL_TABLET | ORAL | Status: DC | PRN
  Administered 2016-12-22 – 2016-12-23 (×4): 2 via ORAL
  Filled 2016-12-22 (×4): qty 2

## 2016-12-22 MED ORDER — HYDRALAZINE HCL 20 MG/ML IJ SOLN: 10.0000 mg | Freq: Four times a day (QID) | INTRAMUSCULAR | Status: DC | PRN

## 2016-12-22 MED ORDER — MORPHINE SULFATE (PF) 4 MG/ML IV SOLN
INTRAVENOUS | Status: AC
  Administered 2016-12-22: 06:00:00
  Filled 2016-12-22: qty 1

## 2016-12-22 MED ORDER — MORPHINE SULFATE (PF) 2 MG/ML IV SOLN
2.0000 mg | Freq: Four times a day (QID) | INTRAVENOUS | Status: DC | PRN
  Administered 2016-12-22 – 2016-12-23 (×2): 2 mg via INTRAVENOUS
  Filled 2016-12-22 (×2): qty 1

## 2016-12-22 MED ORDER — HYDROMORPHONE HCL 1 MG/ML IJ SOLN
INTRAMUSCULAR | Status: AC
  Filled 2016-12-22: qty 1

## 2016-12-22 MED ORDER — KETOROLAC TROMETHAMINE 30 MG/ML IJ SOLN
INTRAMUSCULAR | Status: AC
  Administered 2016-12-22: 30 mg via INTRAVENOUS
  Filled 2016-12-22: qty 1

## 2016-12-22 MED ORDER — ENOXAPARIN SODIUM 40 MG/0.4ML ~~LOC~~ SOLN
40.0000 mg | SUBCUTANEOUS | Status: DC
  Administered 2016-12-22: 40 mg via SUBCUTANEOUS
  Filled 2016-12-22: qty 0.4

## 2016-12-22 MED ORDER — IOPAMIDOL (ISOVUE-300) INJECTION 61%
100.0000 mL | Freq: Once | INTRAVENOUS | Status: AC | PRN
  Administered 2016-12-22: 100 mL via INTRAVENOUS

## 2016-12-22 MED ORDER — LACTATED RINGERS IV BOLUS (SEPSIS)
1000.0000 mL | Freq: Once | INTRAVENOUS | Status: AC
  Administered 2016-12-22: 1000 mL via INTRAVENOUS

## 2016-12-22 MED ORDER — ONDANSETRON HCL 4 MG PO TABS
4.0000 mg | ORAL_TABLET | Freq: Four times a day (QID) | ORAL | Status: DC | PRN
  Administered 2016-12-23: 4 mg via ORAL
  Filled 2016-12-22: qty 1

## 2016-12-22 MED ORDER — POLYETHYLENE GLYCOL 3350 17 G PO PACK: 17.0000 g | PACK | Freq: Every day | ORAL | Status: DC | PRN

## 2016-12-22 MED ORDER — HYDROMORPHONE HCL 1 MG/ML IJ SOLN
1.0000 mg | Freq: Once | INTRAMUSCULAR | Status: AC
  Administered 2016-12-22: 1 mg via INTRAVENOUS

## 2016-12-22 MED ORDER — LACTATED RINGERS IV SOLN
INTRAVENOUS | Status: DC
  Administered 2016-12-22 – 2016-12-23 (×5): via INTRAVENOUS

## 2016-12-22 MED ORDER — KETOROLAC TROMETHAMINE 30 MG/ML IJ SOLN
30.0000 mg | Freq: Four times a day (QID) | INTRAMUSCULAR | Status: DC | PRN
  Administered 2016-12-22: 30 mg via INTRAVENOUS

## 2016-12-22 MED ORDER — LISINOPRIL 20 MG PO TABS
20.0000 mg | ORAL_TABLET | Freq: Every day | ORAL | Status: DC
  Administered 2016-12-22 – 2016-12-23 (×2): 20 mg via ORAL
  Filled 2016-12-22 (×2): qty 1

## 2016-12-22 NOTE — Progress Notes (Signed)
   12/22/16 1000  Clinical Encounter Type  Visited With Patient  Visit Type Initial (HCPOA)  Referral From Nurse  Spiritual Encounters  Spiritual Needs Literature (HCPOA)  HCPOA/AD materials dropped off with patient.  [place] available to review as needed.

## 2016-12-22 NOTE — ED Triage Notes (Signed)
Pt arrived from home via EMS with complaints of abdominal pain. Pt has been dealing with abdominal pain since before Thanksgiving. Pt had an endoscopic procedure done on yesterday morning to remove her gallstones. Doctor told her that her "pancreatitis may come back". Pt states she went home and went to bed and woke up today with horrible pain. Pt states doctor did not send her home with any pain meds. Pt VS per EMS BP-137/99 HR-111 O2 sat-99% RA. Pt medications list includes lisinopril. Pt is tearful and shaking. Pt pain 10/10

## 2016-12-22 NOTE — H&P (Signed)
[facility] [place] NAME: Cassidy Mathis    MR#:  [id number]  DATE OF BIRTH:  1975-08-22  DATE OF ADMISSION:  12/22/2016  PRIMARY CARE PHYSICIAN: Letta Median, MD   REQUESTING/REFERRING PHYSICIAN: Dr. Owens Shark  CHIEF COMPLAINT:   Abdominal pain HISTORY OF PRESENT ILLNESS:  Cassidy Mathis  is a 41 y.o. female with a known history of hypertension, history of kidney stones, history of gallstones status post cholecystectomy underwent ERCP as outpatient and CBD stone was removed.  Patient went home started having abdominal pain yesterday gotten worse as the day passed came to the emergency room found to have lipase of 500.  Patient is being admitted with acute pancreatitis status post ERCP. No vomiting Patient has some   Nausea.  No fever. PAST MEDICAL HISTORY:   Past Medical History:  Diagnosis Date  . Allergy    seasonal  . Anemia   . Chronic kidney disease    KIDNEY STONES CURRENTLY  . Depression   . GERD (gastroesophageal reflux disease)    NO MEDS  . Headache    MIGRAINES  . Hypertension    PT STATES SHE IS SUPPOSED TO BE TAKING LISINOPRIL-HCTZ BUT HAS BEEN OUT "FOR A WHILE" NEEDS TO GET ANOTHER PRESCRIPTION FROM HER PCP  . Pilonidal cyst    TOOK ANTIBIOTIC THIS MONTH AND IT IS RESOLVED    PAST SURGICAL HISTOIRY:   Past Surgical History:  Procedure Laterality Date  . CHOLECYSTECTOMY  10/21/2015   Procedure: LAPAROSCOPIC CHOLECYSTECTOMY WITH INTRAOPERATIVE CHOLANGIOGRAM;  Surgeon: Jules Husbands, MD;  Location: [facility] ORS;  Service: General;;  . CYST EXCISION Left 1998   wrist  . CYST EXCISION  1998   tongue  . EPIGASTRIC HERNIA REPAIR N/A 04/18/2015   Procedure: HERNIA REPAIR EPIGASTRIC ADULT;  Surgeon: Jules Husbands, MD;  Location: [facility] ORS;  Service: General;  Laterality: N/A;  . ERCP N/A 12/21/2016   Procedure: ENDOSCOPIC RETROGRADE CHOLANGIOPANCREATOGRAPHY (ERCP);  Surgeon: [name], MD;  Location: [facility]  ENDOSCOPY;  Service: Endoscopy;  Laterality: N/A;  . HERNIA REPAIR    . TUBAL LIGATION      SOCIAL HISTORY:   Social History   Tobacco Use  . Smoking status: Current Every Day Smoker    Packs/day: 0.50    Years: 12.00    Pack years: 6.00    Types: Cigarettes  . Smokeless tobacco: Never Used  Substance Use Topics  . Alcohol use: Yes    Alcohol/week: 0.0 oz    Comment: Barely    FAMILY HISTORY:   Family History  Problem Relation Age of Onset  . Hypertension Mother   . Kidney Stones Mother   . Gallstones Mother   . Hypertension Father   . Gout Father   . Cancer Paternal Uncle        lung  . Cancer Maternal Grandmother        not sure what type of cancer  . Cancer Maternal Grandfather        not sure what type of cancer  . Cancer Paternal Grandmother        not sure what type of cancer  . Cancer Paternal Grandfather        not sure what type of cancer    DRUG ALLERGIES:  No Known Allergies  REVIEW OF SYSTEMS:  Review of Systems  Constitutional: Negative for chills, fever and weight loss.  HENT: Negative for ear discharge, ear pain and nosebleeds.  Eyes: Negative for blurred vision, pain and discharge.  Respiratory: Negative for sputum production, shortness of breath, wheezing and stridor.   Cardiovascular: Negative for chest pain, palpitations, orthopnea and PND.  Gastrointestinal: Positive for abdominal pain and nausea. Negative for diarrhea and vomiting.  Genitourinary: Negative for frequency and urgency.  Musculoskeletal: Negative for back pain and joint pain.  Neurological: Negative for sensory change, speech change, focal weakness and weakness.  Psychiatric/Behavioral: Negative for depression and hallucinations. The patient is not nervous/anxious.      MEDICATIONS AT HOME:   Prior to Admission medications   Medication Sig Start Date End Date Taking? Authorizing Provider  lisinopril-hydrochlorothiazide (PRINZIDE,ZESTORETIC) 20-25 MG tablet Take 1  tablet by mouth daily. 10/13/15  Yes [provider]  ondansetron (ZOFRAN) 4 MG tablet Take 1 tablet (4 mg total) by mouth every 8 (eight) hours as needed for nausea or vomiting. 12/17/16  Yes Fritzi Mandes, MD  oxyCODONE-acetaminophen (PERCOCET/ROXICET) 5-325 MG tablet Take 1-2 tablets by mouth every 4 (four) hours as needed for moderate pain. 12/17/16  Yes Fritzi Mandes, MD      VITAL SIGNS:  Blood pressure (!) 154/78, pulse 63, temperature 98 F (36.7 C), temperature source Oral, resp. rate 15, height 5\' 5"  (1.651 m), weight 81.6 kg (180 lb), last menstrual period 12/13/2016, SpO2 100 %.  PHYSICAL EXAMINATION:  GENERAL:  41 y.o.-year-old patient lying in the bed with no acute distress.  EYES: Pupils equal, round, reactive to light and accommodation. No scleral icterus. Extraocular muscles intact.  HEENT: Head atraumatic, normocephalic. Oropharynx and nasopharynx clear.  NECK:  Supple, no jugular venous distention. No thyroid enlargement, no tenderness.  LUNGS: Normal breath sounds bilaterally, no wheezing, rales,rhonchi or crepitation. No use of accessory muscles of respiration.  CARDIOVASCULAR: S1, S2 normal. No murmurs, rubs, or gallops.  ABDOMEN: Soft, nontender, nondistended. Bowel sounds present. No organomegaly or mass.  EXTREMITIES: No pedal edema, cyanosis, or clubbing.  NEUROLOGIC: Cranial nerves II through XII are intact. Muscle strength 5/5 in all extremities. Sensation intact. Gait not checked.  PSYCHIATRIC: The patient is alert and oriented x 3.  SKIN: No obvious rash, lesion, or ulcer.   LABORATORY PANEL:   CBC Recent Labs  Lab 12/22/16 0525  WBC 15.7*  HGB 14.4  HCT 43.0  PLT 530*   ------------------------------------------------------------------------------------------------------------------  Chemistries  Recent Labs  Lab 12/22/16 0525  NA 138  K 3.6  CL 102  CO2 24  GLUCOSE 110*  BUN 10  CREATININE 0.91  CALCIUM 9.6  AST 37  ALT 74*   ALKPHOS 234*  BILITOT 0.7   ------------------------------------------------------------------------------------------------------------------  Cardiac Enzymes No results for input(s): TROPONINI in the last 168 hours. ------------------------------------------------------------------------------------------------------------------  RADIOLOGY:  Dg C-arm 1-60 Min-no Report  Result Date: 12/21/2016 Fluoroscopy was utilized by the requesting physician.  No radiographic interpretation.    EKG:    IMPRESSION AND PLAN:  Cassidy Mathis  is a 41 y.o. female with a known history of hypertension, history of kidney stones, history of gallstones status post cholecystectomy underwent ERCP as outpatient and CBD stone was removed.  Patient went home started having abdominal pain yesterday gotten worse as the day passed came to the emergency room found to have lipase of 500.  1.  Acute pancreatitis status post ERCP and CBD stone removal on 12/21/2016 -IV fluids -Follow serial lipase -N.p.o. except sips and meds -GI consultation.  Spoke with Dr. Vicente Males -IV PRN pain meds  2.  Hypertension - we will continue lisinopril. Prn hydralazine  3. DVT  prophylaxis -SQ lovenox   All the records are reviewed and case discussed with ED provider. Management plans discussed with the patient, family and they are in agreement.  CODE STATUS: FULL  TOTAL TIME TAKING CARE OF THIS PATIENT: *30* minutes.    Fritzi Mandes M.D on 12/22/2016 at 11:33 AM  Between 7am to 6pm - Pager - [phone]  After 6pm go to [email or web address] - password [email or web address] [facility]  [facility]  Office  [phone]  CC: Primary care physician; Letta Median, MD

## 2016-12-22 NOTE — Consult Note (Signed)
Jonathon Bellows , MD [address], [place], [place], [place], [address] [address] [place], [place], [place], [address] Phone: [phone]  Fax: [phone]  Consultation  Referring Provider:    Dr Posey Pronto  Primary Care Physician:  Letta Median, MD Primary Gastroenterologist:  Dr. Allen Norris         Reason for Consultation:     Pancreatitis  Date of Admission:  12/22/2016 Date of Consultation:  12/22/2016         HPI:   Cassidy Mathis is a 41 y.o. female who underwent a laparoscopic cholecystectomy 10/15/2015 by Dr. Crista Elliot for cholelithiasis.  She was admitted on 12/15/2016 with pancreatitis.  She had also been on NSAIDs prior to this.  Intraoperative cholangiogram did not show any stones in the common bile duct.She underwent an MRCP on 12/16/2016 demonstrated choledocholithiasis and 2 small calculi in the distant common bile duct.  Acute pancreatitis is also noted.  She underwent an ERCP yesterday with Dr. Allen Norris and stones in the common bile duct were extracted.  She presented to the emergency room earlier this morning significant epigastric pain radiating to her back was found to have an elevated white cell count of 15.7 platelet count of 530 ALT of 74 alkaline phosphatase of 234, lipase is elevated at 535 last checked 5 days back was 108.  The pain is no better since last night , had a lots of nausea and vomiting which has since resolved, feels very thirst, denies any fever .   Past Medical History:  Diagnosis Date  . Allergy    seasonal  . Anemia   . Chronic kidney disease    KIDNEY STONES CURRENTLY  . Depression   . GERD (gastroesophageal reflux disease)    NO MEDS  . Headache    MIGRAINES  . Hypertension    PT STATES SHE IS SUPPOSED TO BE TAKING LISINOPRIL-HCTZ BUT HAS BEEN OUT "FOR A WHILE" NEEDS TO GET ANOTHER PRESCRIPTION FROM HER PCP  . Pilonidal cyst    TOOK ANTIBIOTIC THIS MONTH AND IT IS RESOLVED    Past Surgical History:  Procedure Laterality Date  .  CHOLECYSTECTOMY  10/21/2015   Procedure: LAPAROSCOPIC CHOLECYSTECTOMY WITH INTRAOPERATIVE CHOLANGIOGRAM;  Surgeon: Jules Husbands, MD;  Location: [facility] ORS;  Service: General;;  . CYST EXCISION Left 1998   wrist  . CYST EXCISION  1998   tongue  . EPIGASTRIC HERNIA REPAIR N/A 04/18/2015   Procedure: HERNIA REPAIR EPIGASTRIC ADULT;  Surgeon: Jules Husbands, MD;  Location: [facility] ORS;  Service: General;  Laterality: N/A;  . ERCP N/A 12/21/2016   Procedure: ENDOSCOPIC RETROGRADE CHOLANGIOPANCREATOGRAPHY (ERCP);  Surgeon: [name], MD;  Location: [facility] ENDOSCOPY;  Service: Endoscopy;  Laterality: N/A;  . HERNIA REPAIR    . TUBAL LIGATION      Prior to Admission medications   Medication Sig Start Date End Date Taking? Authorizing Provider  lisinopril-hydrochlorothiazide (PRINZIDE,ZESTORETIC) 20-25 MG tablet Take 1 tablet by mouth daily. 10/13/15  Yes [provider]  ondansetron (ZOFRAN) 4 MG tablet Take 1 tablet (4 mg total) by mouth every 8 (eight) hours as needed for nausea or vomiting. 12/17/16  Yes Fritzi Mandes, MD  oxyCODONE-acetaminophen (PERCOCET/ROXICET) 5-325 MG tablet Take 1-2 tablets by mouth every 4 (four) hours as needed for moderate pain. 12/17/16  Yes Fritzi Mandes, MD    Family History  Problem Relation Age of Onset  . Hypertension Mother   . Kidney Stones Mother   . Gallstones Mother   .  Hypertension Father   . Gout Father   . Cancer Paternal Uncle        lung  . Cancer Maternal Grandmother        not sure what type of cancer  . Cancer Maternal Grandfather        not sure what type of cancer  . Cancer Paternal Grandmother        not sure what type of cancer  . Cancer Paternal Grandfather        not sure what type of cancer     Social History   Tobacco Use  . Smoking status: Current Every Day Smoker    Packs/day: 0.50    Years: 12.00    Pack years: 6.00    Types: Cigarettes  . Smokeless tobacco: Never Used  Substance Use Topics  . Alcohol use: Yes     Alcohol/week: 0.0 oz    Comment: Barely  . Drug use: Yes    Types: Marijuana    Comment: "not often"    Allergies as of 12/22/2016  . (No Known Allergies)    Review of Systems:    All systems reviewed and negative except where noted in HPI.   Physical Exam:  Vital signs in last 24 hours: Temp:  [97.8 F (36.6 C)-98 F (36.7 C)] 98 F (36.7 C) (11/28 0530) Pulse Rate:  [58-88] 63 (11/28 0943) Resp:  [12-20] 15 (11/28 0943) BP: (132-179)/(78-113) 154/78 (11/28 0943) SpO2:  [98 %-100 %] 100 % (11/28 0943) Weight:  [180 lb (81.6 kg)] 180 lb (81.6 kg) (11/28 0533)   General:   Pleasant, cooperative in NAD Head:  Normocephalic and atraumatic. Eyes:   No icterus.   Conjunctiva pink. PERRLA. Ears:  Normal auditory acuity. Neck:  Supple; no masses or thyroidomegaly Lungs: Respirations even and unlabored. Lungs clear to auscultation bilaterally.   No wheezes, crackles, or rhonchi.  Heart:  Regular rate and rhythm;  Without murmur, clicks, rubs or gallops Abdomen:  Soft, tender in the epigastrium, Normal bowel sounds. No appreciable masses or hepatomegaly.  No rebound or guarding.  Neurologic:  Alert and oriented x3;  grossly normal neurologically. Skin:  Intact without significant lesions or rashes. Cervical Nodes:  No significant cervical adenopathy. Psych:  Alert and cooperative. Normal affect.  LAB RESULTS: Recent Labs    12/22/16 0525  WBC 15.7*  HGB 14.4  HCT 43.0  PLT 530*   BMET Recent Labs    12/22/16 0525  NA 138  K 3.6  CL 102  CO2 24  GLUCOSE 110*  BUN 10  CREATININE 0.91  CALCIUM 9.6   LFT Recent Labs    12/22/16 0525  PROT 8.1  ALBUMIN 4.0  AST 37  ALT 74*  ALKPHOS 234*  BILITOT 0.7   PT/INR No results for input(s): LABPROT, INR in the last 72 hours.  STUDIES: Dg C-arm 1-60 Min-no Report  Result Date: 12/21/2016 Fluoroscopy was utilized by the requesting physician.  No radiographic interpretation.      Impression / Plan:    Cassidy Mathis is a 41 y.o. y/o female with possible post ERCP pancreatitis.She is tender in her Epigastrium likely secondary to pancreatitis. Her mucus membranes are dry suggesting she needs some more IV fluids   Plan 1.  Keep n.p.o. 2.  IV l Ringer lactate, give 1 litre bolus, then maintenance 150 cc per hour, discussed with her nurse to call me after the litre if still thirsty and feels dry can give another litre  over 2 hours  3.  Serial abdominal exams , CT scan of the abdomen to rule out perforation. 4.  When she felt the pain is better and feels like eating something I would try commencing her on clears and advance as tolerated.  Thank you for involving me in the care of this patient.      LOS: 0 days   Jonathon Bellows, MD  12/22/2016, 10:48 AM

## 2016-12-22 NOTE — ED Provider Notes (Addendum)
[facility] Emergency Department Provider Note   First MD Initiated Contact with Patient 12/22/16 [phone]     (approximate)  I have reviewed the triage vital signs and the nursing notes.   HISTORY  Chief Complaint Abdominal Pain   HPI Cassidy Mathis is a 41 y.o. female with below list of chronic medical conditions including recent admission for pancreatitis (11/23) and ERCP performed yesterday return to the emergency department 10 out of 10 abdominal pain accompanied by nausea and vomiting.  Patient denies any fever afebrile on presentation.  Patient denies any urinary symptoms.   Past Medical History:  Diagnosis Date  . Allergy    seasonal  . Anemia   . Chronic kidney disease    KIDNEY STONES CURRENTLY  . Depression   . GERD (gastroesophageal reflux disease)    NO MEDS  . Headache    MIGRAINES  . Hypertension    PT STATES SHE IS SUPPOSED TO BE TAKING LISINOPRIL-HCTZ BUT HAS BEEN OUT "FOR A WHILE" NEEDS TO GET ANOTHER PRESCRIPTION FROM HER PCP  . Pilonidal cyst    TOOK ANTIBIOTIC THIS MONTH AND IT IS RESOLVED    Patient Active Problem List   Diagnosis Date Noted  . Common bile duct stone   . Pancreatitis 12/16/2016  . 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 Incarcerated epigastric hernia     Past Surgical History:  Procedure Laterality Date  . CHOLECYSTECTOMY  10/21/2015   Procedure: LAPAROSCOPIC CHOLECYSTECTOMY WITH INTRAOPERATIVE CHOLANGIOGRAM;  Surgeon: Jules Husbands, MD;  Location: [facility] ORS;  Service: General;;  . CYST EXCISION Left 1998   wrist  . CYST EXCISION  1998   tongue  . EPIGASTRIC HERNIA REPAIR N/A 04/18/2015   Procedure: HERNIA REPAIR EPIGASTRIC ADULT;  Surgeon: Jules Husbands, MD;  Location: [facility] ORS;  Service: General;  Laterality: N/A;  . HERNIA REPAIR    . TUBAL LIGATION      Prior to Admission medications   Medication Sig Start Date End Date Taking? Authorizing Provider    lisinopril-hydrochlorothiazide (PRINZIDE,ZESTORETIC) 20-25 MG tablet Take 1 tablet by mouth daily. 10/13/15   [provider]  ondansetron (ZOFRAN) 4 MG tablet Take 1 tablet (4 mg total) by mouth every 8 (eight) hours as needed for nausea or vomiting. 12/17/16   Fritzi Mandes, MD  oxyCODONE-acetaminophen (PERCOCET/ROXICET) 5-325 MG tablet Take 1-2 tablets by mouth every 4 (four) hours as needed for moderate pain. 12/17/16   Fritzi Mandes, MD    Allergies No known drug allergies  Family History  Problem Relation Age of Onset  . Hypertension Mother   . Kidney Stones Mother   . Gallstones Mother   . Hypertension Father   . Gout Father   . Cancer Paternal Uncle        lung  . Cancer Maternal Grandmother        not sure what type of cancer  . Cancer Maternal Grandfather        not sure what type of cancer  . Cancer Paternal Grandmother        not sure what type of cancer  . Cancer Paternal Grandfather        not sure what type of cancer    Social History Social History   Tobacco Use  . Smoking status: Current Every Day Smoker    Packs/day: 0.50    Years: 12.00    Pack years: 6.00    Types: Cigarettes  . Smokeless  tobacco: Never Used  Substance Use Topics  . Alcohol use: Yes    Alcohol/week: 0.0 oz    Comment: Barely  . Drug use: Yes    Types: Marijuana    Comment: "not often"    Review of Systems Constitutional: No fever/chills Eyes: No visual changes. ENT: No sore throat. Cardiovascular: Denies chest pain. Respiratory: Denies shortness of breath. Gastrointestinal: Positive for abdominal pain and vomiting.  No diarrhea.  No constipation. Genitourinary: Negative for dysuria. Musculoskeletal: Negative for neck pain.  Negative for back pain. Integumentary: Negative for rash. Neurological: Negative for headaches, focal weakness or numbness.   ____________________________________________   PHYSICAL EXAM:  VITAL SIGNS: ED Triage Vitals  Enc Vitals Group      BP 12/22/16 0530 (!) 154/100     Pulse Rate 12/22/16 0530 88     Resp 12/22/16 0530 18     Temp 12/22/16 0530 98 F (36.7 C)     Temp Source 12/22/16 0530 Oral     SpO2 12/22/16 0530 100 %     Weight 12/22/16 0533 81.6 kg (180 lb)     Height 12/22/16 0533 1.651 m (5\' 5" )     Head Circumference --      Peak Flow --      Pain Score 12/22/16 0530 10     Pain Loc --      Pain Edu? --      Excl. in [place]? --     Constitutional: Alert and oriented.  Apparent discomfort Eyes: Conjunctivae are normal.  Head: Atraumatic. Mouth/Throat: Mucous membranes are moist.  Oropharynx non-erythematous. Neck: No stridor.   Cardiovascular: Normal rate, regular rhythm. Good peripheral circulation. Grossly normal heart sounds. Respiratory: Normal respiratory effort.  No retractions. Lungs CTAB. Gastrointestinal: Epigastric/left upper quadrant tenderness to palpation no distention.  Musculoskeletal: No lower extremity tenderness nor edema. No gross deformities of extremities. Neurologic:  Normal speech and language. No gross focal neurologic deficits are appreciated.  Skin:  Skin is warm, dry and intact. No rash noted. Psychiatric: Mood and affect are normal. Speech and behavior are normal.  ____________________________________________   LABS (all labs ordered are listed, but only abnormal results are displayed)  Labs Reviewed  CBC WITH DIFFERENTIAL/PLATELET - Abnormal; Notable for the following components:      Result Value   WBC 15.7 (*)    Platelets 530 (*)    Neutro Abs 11.9 (*)    Monocytes Absolute 1.2 (*)    All other components within normal limits  COMPREHENSIVE METABOLIC PANEL - Abnormal; Notable for the following components:   Glucose, Bld 110 (*)    ALT 74 (*)    Alkaline Phosphatase 234 (*)    All other components within normal limits  LIPASE, BLOOD   _______________________    Procedures   ____________________________________________   INITIAL IMPRESSION / ASSESSMENT  AND PLAN / ED COURSE  As part of my medical decision making, I reviewed the following data within the electronic MEDICAL RECORD [id number] year old female presented with above-stated history and physical exam concerning for acute pancreatitis status post ERCP performed yesterday.  Patient received IV morphine 4 mg on arrival without improvement of pain and as such 1 mg of IV Dilaudid was given.  Lab data notable for elevated lipase of 535 patient's lipase was 108 before discharge on 11/23.  In addition lab data notable for leukocytosis of 15.7.  Patient discussed with Dr.: [facility] Hospitalist for admission for acute pancreatitis ____________________________________________  FINAL CLINICAL IMPRESSION(S) / ED DIAGNOSES  Final diagnoses:  Other acute pancreatitis without infection or necrosis     MEDICATIONS GIVEN DURING THIS VISIT:  Medications  ondansetron (ZOFRAN) 4 MG/2ML injection (  Given 12/22/16 0536)  morphine 4 MG/ML injection (  Given 12/22/16 0536)  ondansetron (ZOFRAN) 4 MG/2ML injection (  Given 12/22/16 0536)  HYDROmorphone (DILAUDID) injection 1 mg (1 mg Intravenous Given 12/22/16 0722)     ED Discharge Orders    None       Note:  This document was prepared using Dragon voice recognition software and may include unintentional dictation errors.    Gregor Hams, MD 12/22/16 5750    Gregor Hams, MD 12/22/16 [phone]

## 2016-12-23 LAB — LIPASE, BLOOD: Lipase: 95 U/L — ABNORMAL HIGH (ref 11–51)

## 2016-12-23 MED ORDER — LISINOPRIL 20 MG PO TABS: 20.0000 mg | ORAL_TABLET | Freq: Every day | ORAL | 1 refills | Status: DC

## 2016-12-23 MED ORDER — HYDROCODONE-ACETAMINOPHEN 5-325 MG PO TABS: 1.0000 | ORAL_TABLET | Freq: Four times a day (QID) | ORAL | 0 refills | Status: DC | PRN

## 2016-12-23 NOTE — Discharge Summary (Signed)
[facility] NAME: Cassidy Mathis    MR#:  [id number]  DATE OF BIRTH:  07/16/1975  DATE OF ADMISSION:  12/22/2016 ADMITTING PHYSICIAN: Fritzi Mandes, MD  DATE OF DISCHARGE: 12/23/2016  PRIMARY CARE PHYSICIAN: Letta Median, MD    ADMISSION DIAGNOSIS:  Other acute pancreatitis without infection or necrosis [K85.80]  DISCHARGE DIAGNOSIS:  Acute pancreatitis post ERCP and CBD stone removal  SECONDARY DIAGNOSIS:   Past Medical History:  Diagnosis Date  . Allergy    seasonal  . Anemia   . Chronic kidney disease    KIDNEY STONES CURRENTLY  . Depression   . GERD (gastroesophageal reflux disease)    NO MEDS  . Headache    MIGRAINES  . Hypertension    PT STATES SHE IS SUPPOSED TO BE TAKING LISINOPRIL-HCTZ BUT HAS BEEN OUT "FOR A WHILE" NEEDS TO GET ANOTHER PRESCRIPTION FROM HER PCP  . Pilonidal cyst    TOOK ANTIBIOTIC THIS MONTH AND IT IS RESOLVED    HOSPITAL COURSE:   Sonnie Bias  is a 41 y.o. female with a known history of hypertension, history of kidney stones, history of gallstones status post cholecystectomy underwent ERCP as outpatient and CBD stone was removed.  Patient went home started having abdominal pain yesterday gotten worse as the day passed came to the emergency room found to have lipase of 500.  1.  Acute pancreatitis status post ERCP and CBD stone removal on 12/21/2016 -received aggressive IV fluids -Follow serial lipase came in with >500---95 -N.p.o. except sips and meds--CLD--soft diet -GI consultation.  Spoke with Dr. Vicente Males -IV PRN pain meds  2.  Hypertension - we will continue lisinopril. Prn hydralazine -d/ced HCTZ  3. DVT prophylaxis -SQ lovenox  DC home with outpatient follow-up with GI as needed and primary care physician follow-up.  CONSULTS OBTAINED:  Treatment Team:  Jonathon Bellows, MD  DRUG ALLERGIES:  No Known Allergies  DISCHARGE MEDICATIONS:   Allergies as of  12/23/2016   No Known Allergies     Medication List    STOP taking these medications   lisinopril-hydrochlorothiazide 20-25 MG tablet Commonly known as:  PRINZIDE,ZESTORETIC     TAKE these medications   HYDROcodone-acetaminophen 5-325 MG tablet Commonly known as:  NORCO/VICODIN Take 1-2 tablets by mouth every 6 (six) hours as needed for moderate pain or severe pain.   lisinopril 20 MG tablet Commonly known as:  PRINIVIL,ZESTRIL Take 1 tablet (20 mg total) by mouth daily. Start taking on:  12/24/2016   ondansetron 4 MG tablet Commonly known as:  ZOFRAN Take 1 tablet (4 mg total) by mouth every 8 (eight) hours as needed for nausea or vomiting.   oxyCODONE-acetaminophen 5-325 MG tablet Commonly known as:  PERCOCET/ROXICET Take 1-2 tablets by mouth every 4 (four) hours as needed for moderate pain.       If you experience worsening of your admission symptoms, develop shortness of breath, life threatening emergency, suicidal or homicidal thoughts you must seek medical attention immediately by calling 911 or calling your MD immediately  if symptoms less severe.  You Must read complete instructions/literature along with all the possible adverse reactions/side effects for all the Medicines you take and that have been prescribed to you. Take any new Medicines after you have completely understood and accept all the possible adverse reactions/side effects.   Please note  You were cared for by a hospitalist during your hospital stay. If you have any questions about your  discharge medications or the care you received while you were in the hospital after you are discharged, you can call the unit and asked to speak with the hospitalist on call if the hospitalist that took care of you is not available. Once you are discharged, your primary care physician will handle any further medical issues. Please note that NO REFILLS for any discharge medications will be authorized once you are discharged,  as it is imperative that you return to your primary care physician (or establish a relationship with a primary care physician if you do not have one) for your aftercare needs so that they can reassess your need for medications and monitor your lab values. Today   SUBJECTIVE   Pain some better.  Tolerating clear liquid  VITAL SIGNS:  Blood pressure 126/86, pulse 63, temperature 98.3 F (36.8 C), temperature source Oral, resp. rate 12, height 5\' 5"  (1.651 m), weight 81.6 kg (180 lb), last menstrual period 12/13/2016, SpO2 100 %.  I/O:    Intake/Output Summary (Last 24 hours) at 12/23/2016 1318 Last data filed at 12/23/2016 1121 Gross per 24 hour  Intake 2679.16 ml  Output 2200 ml  Net 479.16 ml    PHYSICAL EXAMINATION:  GENERAL:  41 y.o.-year-old patient lying in the bed with no acute distress.  EYES: Pupils equal, round, reactive to light and accommodation. No scleral icterus. Extraocular muscles intact.  HEENT: Head atraumatic, normocephalic. Oropharynx and nasopharynx clear.  NECK:  Supple, no jugular venous distention. No thyroid enlargement, no tenderness.  LUNGS: Normal breath sounds bilaterally, no wheezing, rales,rhonchi or crepitation. No use of accessory muscles of respiration.  CARDIOVASCULAR: S1, S2 normal. No murmurs, rubs, or gallops.  ABDOMEN: Soft, non-tender, non-distended. Bowel sounds present. No organomegaly or mass.  EXTREMITIES: No pedal edema, cyanosis, or clubbing.  NEUROLOGIC: Cranial nerves II through XII are intact. Muscle strength 5/5 in all extremities. Sensation intact. Gait not checked.  PSYCHIATRIC: The patient is alert and oriented x 3.  SKIN: No obvious rash, lesion, or ulcer.   DATA REVIEW:   CBC  Recent Labs  Lab 12/22/16 0525  WBC 15.7*  HGB 14.4  HCT 43.0  PLT 530*    Chemistries  Recent Labs  Lab 12/22/16 0525  NA 138  K 3.6  CL 102  CO2 24  GLUCOSE 110*  BUN 10  CREATININE 0.91  CALCIUM 9.6  AST 37  ALT 74*  ALKPHOS  234*  BILITOT 0.7    Microbiology Results   No results found for this or any previous visit (from the past 240 hour(s)).  RADIOLOGY:  Ct Abdomen Pelvis W Contrast  Result Date: 12/22/2016 CLINICAL DATA:  Acute pancreatitis status post ERCP. EXAM: CT ABDOMEN AND PELVIS WITH CONTRAST TECHNIQUE: Multidetector CT imaging of the abdomen and pelvis was performed using the standard protocol following bolus administration of intravenous contrast. CONTRAST:  176mL ISOVUE-300 IOPAMIDOL (ISOVUE-300) INJECTION 61% COMPARISON:  CT abdomen pelvis dated December 15, 2016. FINDINGS: Lower chest: No acute abnormality. Hepatobiliary: No focal liver abnormality. Prior cholecystectomy. Interval decrease in size of the common bile duct, now measuring 6-7 mm, at the upper limits of normal, previously 8-9 mm. Pancreas: Persistently edematous pancreatic parenchyma with surrounding fluid and inflammatory stranding. The pancreas enhances normally. No peripancreatic fluid collection. Spleen: Normal in size without focal abnormality. Adrenals/Urinary Tract: Adrenal glands are unremarkable. Kidneys are normal, without renal calculi, focal lesion, or hydronephrosis. Bladder is unremarkable. Stomach/Bowel: The stomach is within normal limits. Mild inflammatory changes of the duodenum and  proximal jejunum, likely reactive. No obstruction. Normal appendix. Vascular/Lymphatic: No significant vascular findings are present. The splenic vein is patent. Multiple prominent peripancreatic lymph nodes are likely reactive. Reproductive: Uterus and bilateral adnexa are unremarkable. Other: Small amount of free fluid in the pelvis, unchanged. No pneumoperitoneum. Musculoskeletal: No acute or significant osseous findings. IMPRESSION: 1. Findings consistent with acute pancreatitis, similar to prior study. No evidence of necrosis or peripancreatic fluid collection. 2. Interval decrease in size of the common bile duct, now measuring 6-7 mm, at the  upper limits of normal, previously 8-9 mm. Electronically Signed   By: Titus Dubin M.D.   On: 12/22/2016 14:45     Management plans discussed with the patient, family and they are in agreement.  CODE STATUS:     Code Status Orders  (From admission, onward)        Start     Ordered   12/22/16 0935  Full code  Continuous     12/22/16 0934    Code Status History    Date Active Date Inactive Code Status Order ID Comments User Context   12/16/2016 01:48 12/17/2016 16:34 Full Code [id number]  Harrie Foreman, MD Inpatient      TOTAL TIME TAKING CARE OF THIS PATIENT: 40 minutes.    Fritzi Mandes M.D on 12/23/2016 at 1:18 PM  Between 7am to 6pm - Pager - [phone] After 6pm go to [email or web address] - password [email or web address] [facility]  Office  [phone]  CC: Primary care physician; Letta Median, MD

## 2016-12-23 NOTE — Progress Notes (Signed)
Cassidy Mathis was admitted to the Hospital on 12/22/2016 and Discharged  12/23/2016 and should be excused from work/school   for 5 days starting 12/22/2016 , may return to work/school without any restrictions.  Call Fritzi Mandes MD, [facility]  [phone] with questions.  Fritzi Mandes M.D on 12/23/2016,at 1:20 PM

## 2016-12-23 NOTE — Progress Notes (Signed)
Pt discharged per MD order. IV removed. Prescription and work note given to pt. Discharge instructions and medications reviewed with pt. Pt verbalized understanding. Pt taken to car via wheelchair by staff.

## 2016-12-23 NOTE — Progress Notes (Signed)
Jonathon Bellows , MD [address], [place], [place], [place], [address] [address], [place], [place], [place], [address] Phone: [phone]  Fax: [phone]   Jashayla Glatfelter Baez is being followed for post ERCP pancreatitis  Day 1 of follow up   Subjective:  Feels much better, tolrated liquid diet   Objective: Vital signs in last 24 hours: Vitals:   12/22/16 0943 12/22/16 1307 12/22/16 2010 12/23/16 0447  BP: (!) 154/78 137/75 133/73 138/71  Pulse: 63 60 69 67  Resp: 15 15 18 17   Temp:  98.1 F (36.7 C) 98.7 F (37.1 C) 98 F (36.7 C)  TempSrc:  Oral Oral   SpO2: 100% 100% 97% 99%  Weight:      Height:       Weight change:   Intake/Output Summary (Last 24 hours) at 12/23/2016 1497 Last data filed at 12/23/2016 0263 Gross per 24 hour  Intake 3026.16 ml  Output 1800 ml  Net 1226.16 ml     Exam: Heart:: Regular rate and rhythm, S1S2 present or without murmur or extra heart sounds Lungs: normal, clear to auscultation and clear to auscultation and percussion Abdomen: soft, nontender, normal bowel sounds   Lab Results: CMP Latest Ref Rng & Units 12/22/2016 12/17/2016 12/16/2016  Glucose 65 - 99 mg/dL 110(H) - -  BUN 6 - 20 mg/dL 10 - -  Creatinine 0.44 - 1.00 mg/dL 0.91 - -  Sodium 135 - 145 mmol/L 138 - -  Potassium 3.5 - 5.1 mmol/L 3.6 - -  Chloride 101 - 111 mmol/L 102 - -  CO2 22 - 32 mmol/L 24 - -  Calcium 8.9 - 10.3 mg/dL 9.6 - -  Total Protein 6.5 - 8.1 g/dL 8.1 5.9(L) 6.4(L)  Total Bilirubin 0.3 - 1.2 mg/dL 0.7 1.0 1.1  Alkaline Phos 38 - 126 U/L 234(H) 157(H) 182(H)  AST 15 - 41 U/L 37 70(H) 102(H)  ALT 14 - 54 U/L 74(H) 164(H) 204(H)    Micro Results: No results found for this or any previous visit (from the past 240 hour(s)). Studies/Results: Ct Abdomen Pelvis W Contrast  Result Date: 12/22/2016 CLINICAL DATA:  Acute pancreatitis status post ERCP. EXAM: CT ABDOMEN AND PELVIS WITH CONTRAST TECHNIQUE: Multidetector CT imaging of the abdomen  and pelvis was performed using the standard protocol following bolus administration of intravenous contrast. CONTRAST:  140mL ISOVUE-300 IOPAMIDOL (ISOVUE-300) INJECTION 61% COMPARISON:  CT abdomen pelvis dated December 15, 2016. FINDINGS: Lower chest: No acute abnormality. Hepatobiliary: No focal liver abnormality. Prior cholecystectomy. Interval decrease in size of the common bile duct, now measuring 6-7 mm, at the upper limits of normal, previously 8-9 mm. Pancreas: Persistently edematous pancreatic parenchyma with surrounding fluid and inflammatory stranding. The pancreas enhances normally. No peripancreatic fluid collection. Spleen: Normal in size without focal abnormality. Adrenals/Urinary Tract: Adrenal glands are unremarkable. Kidneys are normal, without renal calculi, focal lesion, or hydronephrosis. Bladder is unremarkable. Stomach/Bowel: The stomach is within normal limits. Mild inflammatory changes of the duodenum and proximal jejunum, likely reactive. No obstruction. Normal appendix. Vascular/Lymphatic: No significant vascular findings are present. The splenic vein is patent. Multiple prominent peripancreatic lymph nodes are likely reactive. Reproductive: Uterus and bilateral adnexa are unremarkable. Other: Small amount of free fluid in the pelvis, unchanged. No pneumoperitoneum. Musculoskeletal: No acute or significant osseous findings. IMPRESSION: 1. Findings consistent with acute pancreatitis, similar to prior study. No evidence of necrosis or peripancreatic fluid collection. 2. Interval decrease in size of the common bile duct, now measuring  6-7 mm, at the upper limits of normal, previously 8-9 mm. Electronically Signed   By: Titus Dubin M.D.   On: 12/22/2016 14:45   Dg C-arm 1-60 Min-no Report  Result Date: 12/21/2016 Fluoroscopy was utilized by the requesting physician.  No radiographic interpretation.   Medications: I have reviewed the patient's current medications. Scheduled Meds: .  enoxaparin (LOVENOX) injection  40 mg Subcutaneous Q24H  . lisinopril  20 mg Oral Daily   Continuous Infusions: . lactated ringers 150 mL/hr at 12/23/16 0239   PRN Meds:.hydrALAZINE, HYDROcodone-acetaminophen, morphine injection, ondansetron **OR** ondansetron (ZOFRAN) IV, polyethylene glycol   Assessment: Active Problems:   Acute pancreatitis  Kimmora L Saldierna is a 41 y.o. y/o female with possible post ERCP pancreatitis.She underwent a CT scan of the abdomen yesterday and acute pancreatitis was noted.  Plan: 1.  Advance diet as tolerated and discharge when tolerating a full diet and  pain is well controlled.   LOS: 1 day   Jonathon Bellows, MD 12/23/2016, 9:03 AM

## 2017-02-07 ENCOUNTER — Other Ambulatory Visit: Payer: Self-pay

## 2017-02-07 ENCOUNTER — Emergency Department
Admission: EM | Admit: 2017-02-07 | Discharge: 2017-02-07 | Disposition: A | Discharge status: Home / Self Care | Payer: Medicaid Other | Attending: Emergency Medicine | Admitting: Emergency Medicine

## 2017-02-07 ENCOUNTER — Encounter: Payer: Self-pay | Admitting: Emergency Medicine

## 2017-02-07 DIAGNOSIS — N644 Mastodynia: Secondary | ICD-10-CM | POA: Diagnosis present

## 2017-02-07 DIAGNOSIS — Z79899 Other long term (current) drug therapy: Secondary | ICD-10-CM | POA: Insufficient documentation

## 2017-02-07 DIAGNOSIS — I10 Essential (primary) hypertension: Secondary | ICD-10-CM | POA: Insufficient documentation

## 2017-02-07 DIAGNOSIS — N6320 Unspecified lump in the left breast, unspecified quadrant: Secondary | ICD-10-CM | POA: Insufficient documentation

## 2017-02-07 DIAGNOSIS — F17210 Nicotine dependence, cigarettes, uncomplicated: Secondary | ICD-10-CM | POA: Insufficient documentation

## 2017-02-07 DIAGNOSIS — N63 Unspecified lump in unspecified breast: Secondary | ICD-10-CM

## 2017-02-07 HISTORY — DX: Acute pancreatitis without necrosis or infection, unspecified: K85.90

## 2017-02-07 NOTE — ED Provider Notes (Signed)
[facility] Emergency Department Provider Note   ____________________________________________    I have reviewed the triage vital signs and the nursing notes.   HISTORY  Chief Complaint Breast Pain     HPI Cassidy Mathis is a 42 y.o. female presents with complaints of a nodule that she discovered in her left breast at the 12 o'clock position.  She discovered this last night.  She became anxious and decided to come to the emergency department today.  She denies nipple discharge.  No fevers or chills or rash.  No history of breast cancer.  She has not taken anything for this.  She reports it is mildly "sore"   Past Medical History:  Diagnosis Date  . Allergy    seasonal  . Anemia   . Chronic kidney disease    KIDNEY STONES CURRENTLY  . Depression   . GERD (gastroesophageal reflux disease)    NO MEDS  . Headache    MIGRAINES  . Hypertension    PT STATES SHE IS SUPPOSED TO BE TAKING LISINOPRIL-HCTZ BUT HAS BEEN OUT "FOR A WHILE" NEEDS TO GET ANOTHER PRESCRIPTION FROM HER PCP  . Pancreatitis   . Pilonidal cyst    TOOK ANTIBIOTIC THIS MONTH AND IT IS RESOLVED    Patient Active Problem List   Diagnosis Date Noted  . Acute pancreatitis 12/22/2016  . Common bile duct stone   . Pancreatitis 12/16/2016  . 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 Incarcerated epigastric hernia     Past Surgical History:  Procedure Laterality Date  . CHOLECYSTECTOMY  10/21/2015   Procedure: LAPAROSCOPIC CHOLECYSTECTOMY WITH INTRAOPERATIVE CHOLANGIOGRAM;  Surgeon: Jules Husbands, MD;  Location: [facility] ORS;  Service: General;;  . CYST EXCISION Left 1998   wrist  . CYST EXCISION  1998   tongue  . EPIGASTRIC HERNIA REPAIR N/A 04/18/2015   Procedure: HERNIA REPAIR EPIGASTRIC ADULT;  Surgeon: Jules Husbands, MD;  Location: [facility] ORS;  Service: General;  Laterality: N/A;  . ERCP N/A 12/21/2016   Procedure: ENDOSCOPIC RETROGRADE  CHOLANGIOPANCREATOGRAPHY (ERCP);  Surgeon: [name], MD;  Location: [facility] ENDOSCOPY;  Service: Endoscopy;  Laterality: N/A;  . HERNIA REPAIR    . TUBAL LIGATION      Prior to Admission medications   Medication Sig Start Date End Date Taking? Authorizing Provider  HYDROcodone-acetaminophen (NORCO/VICODIN) 5-325 MG tablet Take 1-2 tablets by mouth every 6 (six) hours as needed for moderate pain or severe pain. 12/23/16   Fritzi Mandes, MD  lisinopril (PRINIVIL,ZESTRIL) 20 MG tablet Take 1 tablet (20 mg total) by mouth daily. 12/24/16   Fritzi Mandes, MD  ondansetron (ZOFRAN) 4 MG tablet Take 1 tablet (4 mg total) by mouth every 8 (eight) hours as needed for nausea or vomiting. 12/17/16   Fritzi Mandes, MD  oxyCODONE-acetaminophen (PERCOCET/ROXICET) 5-325 MG tablet Take 1-2 tablets by mouth every 4 (four) hours as needed for moderate pain. 12/17/16   Fritzi Mandes, MD     Allergies Patient has no known allergies.  Family History  Problem Relation Age of Onset  . Hypertension Mother   . Kidney Stones Mother   . Gallstones Mother   . Hypertension Father   . Gout Father   . Cancer Paternal Uncle        lung  . Cancer Maternal Grandmother        not sure what type of cancer  . Cancer Maternal Grandfather  not sure what type of cancer  . Cancer Paternal Grandmother        not sure what type of cancer  . Cancer Paternal Grandfather        not sure what type of cancer    Social History Social History   Tobacco Use  . Smoking status: Current Every Day Smoker    Packs/day: 0.50    Years: 12.00    Pack years: 6.00    Types: Cigarettes  . Smokeless tobacco: Never Used  Substance Use Topics  . Alcohol use: Yes    Alcohol/week: 0.0 oz    Comment: Barely  . Drug use: Yes    Types: Marijuana    Comment: "not often"    Review of Systems  Constitutional: No fever/chills   Gastrointestinal: No abdominal pain.   Musculoskeletal: Negative for back pain. Skin: Negative for  rash. Neurological: Negative for headaches    ____________________________________________   PHYSICAL EXAM:  VITAL SIGNS: ED Triage Vitals  Enc Vitals Group     BP 02/07/17 1109 (!) 147/94     Pulse Rate 02/07/17 1109 64     Resp 02/07/17 1109 18     Temp 02/07/17 1109 98.4 F (36.9 C)     Temp Source 02/07/17 1109 Oral     SpO2 02/07/17 1109 100 %     Weight 02/07/17 1109 83.9 kg (185 lb)     Height 02/07/17 1109 1.651 m (5\' 5" )     Head Circumference --      Peak Flow --      Pain Score 02/07/17 1112 7     Pain Loc --      Pain Edu? --      Excl. in [place]? --    Constitutional: Alert and oriented. No acute distress. Pleasant and interactive Eyes: Conjunctivae are normal.   Nose: No congestion/rhinnorhea. Mouth/Throat: Mucous membranes are moist.   Breast: Left breast: Approximately 1 x 1 cm nodule at 12 o'clock position, mildly tender, no nipple discharge erythema orange peel appearance.  No palpable lymph nodes in the axilla Cardiovascular: Normal rate, regular rhythm. Good peripheral circulation. Respiratory: Normal respiratory effort.  No retractions.  Gastrointestinal: Soft and nontender. No distention.  No CVA tenderness.  Musculoskeletal:   Warm and well perfused Neurologic:  Normal speech and language. No gross focal neurologic deficits are appreciated.  Skin:  Skin is warm, dry and intact.  Psychiatric: Mood and affect are normal. Speech and behavior are normal.____________________________________________   LABS (all labs ordered are listed, but only abnormal results are displayed)  Labs Reviewed - No data to display ____________________________________________   ____________________________________________   PROCEDURES  Procedure(s) performed: No  Procedures   Critical Care performed: No ____________________________________________   INITIAL IMPRESSION / ASSESSMENT AND PLAN / ED COURSE  Pertinent labs & imaging results that were available  during my care of the patient were reviewed by me and considered in my medical decision making (see chart for details).  Patient with proximally 1 x 1 cm breast nodule at 12 o'clock position of the left breast, discussed with the patient that this will need follow-up with either ultrasound or mammography, to be determined by her PCP.  She reports she will follow-up with PCP ASAP    ____________________________________________   FINAL CLINICAL IMPRESSION(S) / ED DIAGNOSES  Final diagnoses:  Breast nodule        Note:  This document was prepared using Dragon voice recognition software and may include unintentional dictation errors.  Lavonia Drafts, MD 02/07/17 1235

## 2017-02-07 NOTE — ED Triage Notes (Signed)
Pt states she felt pressure in her left breast, states she felt a knot above nipple and states it is sore to the touch.  Pt denies any previous breast problems, denies and discharge or change in nipple appearance.

## 2017-02-07 NOTE — Discharge Instructions (Signed)
Please follow up with your PCP to arrange for ultrasound or mammogram, this is very important

## 2017-02-07 NOTE — ED Notes (Signed)
FN: pt presents with knot to left breast, noticed last night. Denies any nipple discharge and just had her menstrual cycle last week.

## 2017-03-11 ENCOUNTER — Other Ambulatory Visit: Payer: Self-pay | Admitting: Obstetrics and Gynecology

## 2017-03-11 DIAGNOSIS — C538 Malignant neoplasm of overlapping sites of cervix uteri: Secondary | ICD-10-CM

## 2017-03-16 ENCOUNTER — Encounter: Payer: Self-pay | Admitting: Obstetrics and Gynecology

## 2017-03-16 ENCOUNTER — Inpatient Hospital Stay: Payer: Medicaid Other | Attending: Obstetrics and Gynecology | Admitting: Obstetrics and Gynecology

## 2017-03-16 VITALS — BP 159/104 | HR 73 | Temp 97.8°F | Resp 18 | Ht 65.0 in | Wt 196.0 lb

## 2017-03-16 DIAGNOSIS — C539 Malignant neoplasm of cervix uteri, unspecified: Secondary | ICD-10-CM

## 2017-03-16 DIAGNOSIS — A630 Anogenital (venereal) warts: Secondary | ICD-10-CM

## 2017-03-16 DIAGNOSIS — C531 Malignant neoplasm of exocervix: Secondary | ICD-10-CM

## 2017-03-16 NOTE — Progress Notes (Signed)
Patient refused counsleing service today.

## 2017-03-16 NOTE — H&P (View-Only) (Signed)
Gynecologic Oncology Consult Visit   Referring Provider:  Dr Ouida Sills  Chief Concern: cervical cancer  Subjective:  Ying Blankenhorn Ebert is a 42 y.o. G26P4 female who is seen in consultation for cervical cancer.  PAP 10/17 LSIL +HP HPV.  Failed to show for colposcopy appointment.  12/18 PAP ASCUS, + HR HPV.  03/03/17 Colposcopy with Dr Melven Sartorius: there is acetowhite epithelium at 12+4+7+10o"clock. There is puncation at 7 oclock. There is mosacism at 4 oclock. Representative biopsies are done at 12/05/01/08 oclock. An ECC is also done.   Bxs from ectocervix at 10, 12 and ECC well diff squamous cell cancer. Size of biopsies too small to determine depth of invasion.  She has regular periods sometimes longer and does endorse post coital spotting.   Problem List: Patient Active Problem List   Diagnosis Date Noted  . Acute pancreatitis 12/22/2016  . Common bile duct stone   . Pancreatitis 12/16/2016  . 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 Incarcerated epigastric hernia     Past Medical History: Past Medical History:  Diagnosis Date  . Allergy    seasonal  . Anemia   . Depression   . GERD (gastroesophageal reflux disease)    NO MEDS  . Headache    MIGRAINES  . Hypertension    PT STATES SHE IS SUPPOSED TO BE TAKING LISINOPRIL-HCTZ BUT HAS BEEN OUT "FOR A WHILE" NEEDS TO GET ANOTHER PRESCRIPTION FROM HER PCP  . Pancreatitis   . Pilonidal cyst    TOOK ANTIBIOTIC THIS MONTH AND IT IS RESOLVED    Past Surgical History: Past Surgical History:  Procedure Laterality Date  . CHOLECYSTECTOMY  10/21/2015   Procedure: LAPAROSCOPIC CHOLECYSTECTOMY WITH INTRAOPERATIVE CHOLANGIOGRAM;  Surgeon: Jules Husbands, MD;  Location: [facility] ORS;  Service: General;;  . CHOLECYSTECTOMY Bilateral   . CYST EXCISION     tongue  . EPIGASTRIC HERNIA REPAIR N/A 04/18/2015   Procedure: HERNIA REPAIR EPIGASTRIC ADULT;  Surgeon: Jules Husbands, MD;  Location: [facility] ORS;   Service: General;  Laterality: N/A;  . ERCP N/A 12/21/2016   Procedure: ENDOSCOPIC RETROGRADE CHOLANGIOPANCREATOGRAPHY (ERCP);  Surgeon: [name], MD;  Location[facility] ENDOSCOPY;  Service: Endoscopy;  Laterality: N/A;  . HERNIA REPAIR    . TUBAL LIGATION      Family History: Family History  Problem Relation Age of Onset  . Hypertension Mother   . Gallstones Mother   . Hypertension Father   . Gout Father   . Cancer Paternal Uncle        lung  . Cancer Maternal Grandmother        not sure what type of cancer  . Cancer Maternal Grandfather        not sure what type of cancer  . Cancer Paternal Grandmother        not sure what type of cancer  . Cancer Paternal Grandfather        not sure what type of cancer    Social History: Social History   Socioeconomic History  . Marital status: Single    Spouse name: Not on file  . Number of children: Not on file  . Years of education: Not on file  . Highest education level: Not on file  Social Needs  . Financial resource strain: Not on file  . Food insecurity - worry: Not on file  . Food insecurity - inability: Not on file  . Transportation needs - medical: Not on file  . Transportation  needs - non-medical: Not on file  Occupational History  . Not on file  Tobacco Use  . Smoking status: Current Every Day Smoker    Packs/day: 0.50    Years: 12.00    Pack years: 6.00    Types: Cigarettes  . Smokeless tobacco: Never Used  Substance and Sexual Activity  . Alcohol use: Yes    Alcohol/week: 0.0 oz    Comment: Barely  . Drug use: Yes    Types: Marijuana    Comment: "not often"  . Sexual activity: Not on file  Other Topics Concern  . Not on file  Social History Narrative  . Not on file    Allergies: No Known Allergies  Current Medications: Current Outpatient Medications  Medication Sig Dispense Refill  . lisinopril (PRINIVIL,ZESTRIL) 20 MG tablet Take 1 tablet (20 mg total) by mouth daily. 30 tablet 1  .  HYDROcodone-acetaminophen (NORCO/VICODIN) 5-325 MG tablet Take 1-2 tablets by mouth every 6 (six) hours as needed for moderate pain or severe pain. (Patient not taking: Reported on 03/16/2017) 20 tablet 0  . ondansetron (ZOFRAN) 4 MG tablet Take 1 tablet (4 mg total) by mouth every 8 (eight) hours as needed for nausea or vomiting. (Patient not taking: Reported on 03/16/2017) 20 tablet 0  . oxyCODONE-acetaminophen (PERCOCET/ROXICET) 5-325 MG tablet Take 1-2 tablets by mouth every 4 (four) hours as needed for moderate pain. (Patient not taking: Reported on 03/16/2017) 25 tablet 0   No current facility-administered medications for this visit.     Review of Systems General: negative for, fevers, chills, fatigue, changes in sleep, changes in weight or appetite Skin: negative for changes in color, texture, moles or lesions Eyes: negative for, changes in vision, pain, diplopia HEENT: negative for, change in hearing, pain, discharge, tinnitus, vertigo, voice changes, sore throat, neck masses Breasts: negative for breast lumps Pulmonary: negative for, dyspnea, orthopnea, productive cough Cardiac: negative for, palpitations, syncope, pain, discomfort, pressure Gastrointestinal: negative for, dysphagia, nausea, vomiting, jaundice, pain, constipation, diarrhea, hematemesis, hematochezia Genitourinary/Sexual: negative for, dysuria, discharge, hesitancy, nocturia, retention, stones, infections, STD's, incontinence Ob/Gyn: negative for, pain Musculoskeletal: negative for, pain, stiffness, swelling, range of motion limitation Hematology: negative for, easy bruising, bleeding Neurologic/Psych: negative for, headaches, seizures, paralysis, weakness, tremor, change in gait, change in sensation, mood swings, depression, anxiety, change in memory  Objective:  Physical Examination:  BP (!) 159/104 (BP Location: Left Arm, Patient Position: Sitting) Comment: Patient she taking her b/p medication this AM, just some  stress of being to MD appointment  Pulse 73   Temp 97.8 F (36.6 C) (Tympanic)   Resp 18   Ht 5\' 5"  (1.651 m)   Wt 196 lb (88.9 kg)   BMI 32.62 kg/m    ECOG Performance Status: 0 - Asymptomatic  General appearance: alert, cooperative and appears stated age HEENT:PERRLA and thyroid without masses Lymph node survey: non-palpable, axillary, inguinal, supraclavicular Cardiovascular: regular rate and rhythm, no murmurs or gallops Respiratory: normal air entry, lungs clear to auscultation and no rales, rhonchi or wheezing Breast exam: not examined. Abdomen: no hernias and well healed incision Back: inspection of back is normal Extremities: extremities normal, atraumatic, no cyanosis or edema Skin exam - normal coloration and turgor, no rashes, no suspicious skin lesions noted. Neurological exam reveals alert, oriented, normal speech, no focal findings or movement disorder noted.  Pelvic: exam chaperoned by nurse;  Vulva: normal appearing vulva with no masses, tenderness or lesions; Vagina: normal vagina; Adnexa: normal adnexa in size, nontender and no  masses; Uterus: uterus is normal size, shape, consistency and nontender; Cervix: slightly enlarged and there is broad red slightly raised appearance, no exophytic cancer; Rectal: not indicated and normal rectal, no masses.    Assessment:  Arayah L Rowand is a 42 y.o. 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  Depth of invasion uncertain and she does not have a grossly visible exophytic lesion.  Cervix is hard and suspect she has significant invasion with a stage IB1 cancer.   Medical co-morbidities complicating care: smokes half pack day.   Plan:   Problem List Items Addressed This Visit    None    Visit Diagnoses    Malignant neoplasm of exocervix ([place])    -  Primary      We discussed options for management including cervical conization to determine depth of invasion and then this would determine  whether simple or radical hysterectomy is most appropriate.  She does not desire future fertility.  Surgery scheduled for 03/23/17 with Dr Theora Gianotti.   The risks of surgery were discussed in detail and she understands these to include infection; failure to remove the entire affected area; recurrence of dysplasia; injury to adjacent organs such as bowel, bladder, blood vessels, ureters, vagina; bleeding which may require blood transfusion; anesthesia risk; thromboembolic events; possible death; unforeseen complications; possible need for further procedures; medical complications such as heart attack, stroke, pleural effusion and pneumonia.  She voiced a clear understanding.  She had the opportunity to ask questions and written informed consent was obtained today.  The patient's diagnosis, an outline of the further diagnostic and laboratory studies which will be required, the recommendation, and alternatives were discussed.  All questions were answered to the patient's satisfaction.  A total of 60 minutes were spent with the patient/family today; 50% was spent in education, counseling and coordination of care for cervical cancer.    Mellody Drown, MD  CC:  Letta Median, [address] [place], [place] [id number] [phone]

## 2017-03-16 NOTE — Patient Instructions (Signed)
Cervical Conization °Cervical conization (cone biopsy) is a procedure in which a cone-shaped portion of the cervix is cut out so that it can be examined under a microscope. The procedure is done to check for cancer cells or cells that might turn into cancer (precancerous cells). You may have this procedure if: °· You have abnormal bleeding from your cervix. °· You had an abnormal Pap test. °· Something abnormal was seen on your cervix during an exam. ° °This procedure is performed in either a health care provider’s office or in an operating room. °Tell a health care provider about: °· Any allergies you have. °· All medicines you are taking, including vitamins, herbs, eye drops, creams, and over-the-counter medicines. °· Any problems you or family members have had with the use of anesthetic medicines. °· Any blood disorders you have. °· Any surgeries you have had. °· Any medical conditions you have. °· Your smoking habits. °· When you normally have your period. °· Whether you are pregnant or may be pregnant. °What are the risks? °Generally, this is a safe procedure. However, problems may occur, including: °· Heavy bleeding for several days or weeks after the procedure. °· Allergic reactions to medicines or dyes. °· Increased risk of preterm labor in future pregnancies. °· Infection (rare). °· Damage to the cervix or other structures or organs (rare). ° °What happens before the procedure? °Staying hydrated °Follow instructions from your health care provider about hydration, which may include: °· Up to 2 hours before the procedure - you may continue to drink clear liquids, such as water, clear fruit juice, black coffee, and plain tea. ° °Eating and drinking restrictions °Follow instructions from your health care provider about eating and drinking, which may include: °· 8 hours before the procedure - stop eating heavy meals or foods such as meat, fried foods, or fatty foods. °· 6 hours before the procedure - stop eating  light meals or foods, such as toast or cereal. °· 6 hours before the procedure - stop drinking milk or drinks that contain milk. °· 2 hours before the procedure - stop drinking clear liquids. ° °General instructions °· Do not douche, have sex, use tampons, or use any vaginal medicines before the procedure as told by your health care provider. °· You may be asked to empty your bladder and bowel right before the procedure. °· Ask your health care provider about: °? Changing or stopping your normal medicines. This is important if you take diabetes medicines or blood thinners. °? Taking medicines such as aspirin and ibuprofen. These medicines can thin your blood. Do not take these medicines before your procedure if your doctor tells you not to. °· Plan to have someone take you home from the hospital or clinic. °What happens during the procedure? °· To reduce your risk of infection: °? Your health care team will wash or sanitize their hands. °? Your skin will be washed with soap. °? Hair may be removed from the surgical area. °· You will undress from the waist down and be given a gown to wear. °· You will lie on an examining table and put your feet in stirrups. °· An IV tube will be inserted into one of your veins. °· You will be given one or more of the following: °? A medicine to help you relax (sedative). °? A medicine to numb the area (local anesthetic). °? A medicine to make you fall asleep (general anesthetic). °? A medicine that numbs the cervix (cervical block). °·   A lubricated device called a speculum will be inserted into your vagina. It will be used to spread open the walls of the vagina so your health care provider can see the inside of the vagina and cervix better.  An instrument that has a magnifying lens and a light (colposcope) will let your health care provider examine the cervix more closely.  Your health care provider will apply a solution to your cervix. This turns abnormal areas a pale  color.  A tissue sample will be removed from the cervix using one of the following methods: ? The cold knife method. In this method, the tissue is cut out with a knife (scalpel). ? The loop electrosurgical excision procedure (LEEP) method. In this method, the tissue is cut out with a thin wire that can burn (cauterize) the tissue with an electrical current. ? Laser treatment method. In this method, the tissue is cut out and then cauterized with a laser beam to prevent bleeding.  Your health care provider will apply a paste over the biopsy areas to help control bleeding.  The tissue sample will be examined under a microscope. The procedure may vary among health care providers and hospitals. What happens after the procedure?  Your blood pressure, heart rate, breathing rate, and blood oxygen level will be monitored often until the medicines you were given have worn off.  If you were given a local anesthetic, you will rest at the clinic or hospital until you are stable and feel ready to go home.  If you were given a general anesthetic, you may be monitored for a longer period of time.  You may have some cramping.  You may have bloody discharge or light to moderate bleeding.  You may have dark discharge coming from your vagina. This is from the paste used on the cervix to prevent bleeding. Summary  Cervical conization is a procedure in which a cone-shaped portion of the cervix is cut out so that it can be examined under a microscope.  The procedure is done to check for cancer cells or cells that might turn into cancer (precancerous cells). This information is not intended to replace advice given to you by your health care provider. Make sure you discuss any questions you have with your health care provider. Document Released: 10/21/2004 Document Revised: 01/14/2016 Document Reviewed: 01/14/2016 Elsevier Interactive Patient Education  2017 [employer] Cervical Conization, Care  After This sheet gives you information about how to care for yourself after your procedure. Your doctor may also give you more specific instructions. If you have problems or questions, contact your doctor. Follow these instructions at home: Medicines  Take over-the-counter and prescription medicines only as told by your doctor.  Do not take aspirin until your doctor says it is okay.  If you take pain medicine: ? You may have constipation. To help treat this, your doctor may tell you to:  Drink enough fluid to keep your pee (urine) clear or pale yellow.  Take medicines.  Eat foods that are high in fiber. These include fresh fruits and vegetables, whole grains, bran, and beans.  Limit foods that are high in fat and sugar. These include fried foods and sweet foods. ? Do not drive or use heavy machines. General instructions  You can eat your usual diet unless your doctor tells you not to do so.  Take showers for the first week. Do not take baths, swim, or use hot tubs until your doctor says it is okay.  Do  not douche, use tampons, or have sex until your doctor says it is okay.  For 7-14 days after your procedure, avoid: ? Being very active. ? Exercising. ? Heavy lifting.  Keep all follow-up visits as told by your doctor. This is important. Contact a doctor if:  You have a rash.  You are dizzy or lightheaded.  You feel sick to your stomach (nauseous).  You throw up (vomit).  You have fluid from your vagina (vaginal discharge) that smells bad. Get help right away if:  There are blood clots coming from your vagina.  You have more bleeding than you would have in a normal period. For example, you soak a pad in less than 1 hour.  You have a fever.  You have more and more cramps.  You pass out (faint).  You have pain when peeing.  Your have a lot of pain.  Your pain gets worse.  Your pain does not get better when you take your medicine.  You have blood in your  pee.  You throw up (vomit). Summary  After your procedure, take over-the-counter and prescription medicines only as told by your doctor.  Do not douche, use tampons, or have sex until your doctor says it is okay.  For about 7-14 days after your procedure, try not to exercise or lift heavy objects.  Get help right away if you have new symptoms, or if your symptoms become worse. This information is not intended to replace advice given to you by your health care provider. Make sure you discuss any questions you have with your health care provider. Document Released: 10/21/2007 Document Revised: 01/14/2016 Document Reviewed: 01/14/2016 Elsevier Interactive Patient Education  2017 [employer].

## 2017-03-16 NOTE — Progress Notes (Signed)
Gynecologic Oncology Consult Visit   Referring Provider:  Dr Ouida Sills  Chief Concern: cervical cancer  Subjective:  Cassidy Mathis is a 42 y.o. G13P4 female who is seen in consultation for cervical cancer.  PAP 10/17 LSIL +HP HPV.  Failed to show for colposcopy appointment.  12/18 PAP ASCUS, + HR HPV.  03/03/17 Colposcopy with Dr Melven Sartorius: there is acetowhite epithelium at 12+4+7+10o"clock. There is puncation at 7 oclock. There is mosacism at 4 oclock. Representative biopsies are done at 12/05/01/08 oclock. An ECC is also done.   Bxs from ectocervix at 10, 12 and ECC well diff squamous cell cancer. Size of biopsies too small to determine depth of invasion.  She has regular periods sometimes longer and does endorse post coital spotting.   Problem List: Patient Active Problem List   Diagnosis Date Noted  . Acute pancreatitis 12/22/2016  . Common bile duct stone   . Pancreatitis 12/16/2016  . 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 Incarcerated epigastric hernia     Past Medical History: Past Medical History:  Diagnosis Date  . Allergy    seasonal  . Anemia   . Depression   . GERD (gastroesophageal reflux disease)    NO MEDS  . Headache    MIGRAINES  . Hypertension    PT STATES SHE IS SUPPOSED TO BE TAKING LISINOPRIL-HCTZ BUT HAS BEEN OUT "FOR A WHILE" NEEDS TO GET ANOTHER PRESCRIPTION FROM HER PCP  . Pancreatitis   . Pilonidal cyst    TOOK ANTIBIOTIC THIS MONTH AND IT IS RESOLVED    Past Surgical History: Past Surgical History:  Procedure Laterality Date  . CHOLECYSTECTOMY  10/21/2015   Procedure: LAPAROSCOPIC CHOLECYSTECTOMY WITH INTRAOPERATIVE CHOLANGIOGRAM;  Surgeon: Jules Husbands, MD;  Location: [facility] ORS;  Service: General;;  . CHOLECYSTECTOMY Bilateral   . CYST EXCISION     tongue  . EPIGASTRIC HERNIA REPAIR N/A 04/18/2015   Procedure: HERNIA REPAIR EPIGASTRIC ADULT;  Surgeon: Jules Husbands, MD;  Location: [facility] ORS;   Service: General;  Laterality: N/A;  . ERCP N/A 12/21/2016   Procedure: ENDOSCOPIC RETROGRADE CHOLANGIOPANCREATOGRAPHY (ERCP);  Surgeon: [name], MD;  Location: [facility] ENDOSCOPY;  Service: Endoscopy;  Laterality: N/A;  . HERNIA REPAIR    . TUBAL LIGATION      Family History: Family History  Problem Relation Age of Onset  . Hypertension Mother   . Gallstones Mother   . Hypertension Father   . Gout Father   . Cancer Paternal Uncle        lung  . Cancer Maternal Grandmother        not sure what type of cancer  . Cancer Maternal Grandfather        not sure what type of cancer  . Cancer Paternal Grandmother        not sure what type of cancer  . Cancer Paternal Grandfather        not sure what type of cancer    Social History: Social History   Socioeconomic History  . Marital status: Single    Spouse name: Not on file  . Number of children: Not on file  . Years of education: Not on file  . Highest education level: Not on file  Social Needs  . Financial resource strain: Not on file  . Food insecurity - worry: Not on file  . Food insecurity - inability: Not on file  . Transportation needs - medical: Not on file  . Transportation  needs - non-medical: Not on file  Occupational History  . Not on file  Tobacco Use  . Smoking status: Current Every Day Smoker    Packs/day: 0.50    Years: 12.00    Pack years: 6.00    Types: Cigarettes  . Smokeless tobacco: Never Used  Substance and Sexual Activity  . Alcohol use: Yes    Alcohol/week: 0.0 oz    Comment: Barely  . Drug use: Yes    Types: Marijuana    Comment: "not often"  . Sexual activity: Not on file  Other Topics Concern  . Not on file  Social History Narrative  . Not on file    Allergies: No Known Allergies  Current Medications: Current Outpatient Medications  Medication Sig Dispense Refill  . lisinopril (PRINIVIL,ZESTRIL) 20 MG tablet Take 1 tablet (20 mg total) by mouth daily. 30 tablet 1  .  HYDROcodone-acetaminophen (NORCO/VICODIN) 5-325 MG tablet Take 1-2 tablets by mouth every 6 (six) hours as needed for moderate pain or severe pain. (Patient not taking: Reported on 03/16/2017) 20 tablet 0  . ondansetron (ZOFRAN) 4 MG tablet Take 1 tablet (4 mg total) by mouth every 8 (eight) hours as needed for nausea or vomiting. (Patient not taking: Reported on 03/16/2017) 20 tablet 0  . oxyCODONE-acetaminophen (PERCOCET/ROXICET) 5-325 MG tablet Take 1-2 tablets by mouth every 4 (four) hours as needed for moderate pain. (Patient not taking: Reported on 03/16/2017) 25 tablet 0   No current facility-administered medications for this visit.     Review of Systems General: negative for, fevers, chills, fatigue, changes in sleep, changes in weight or appetite Skin: negative for changes in color, texture, moles or lesions Eyes: negative for, changes in vision, pain, diplopia HEENT: negative for, change in hearing, pain, discharge, tinnitus, vertigo, voice changes, sore throat, neck masses Breasts: negative for breast lumps Pulmonary: negative for, dyspnea, orthopnea, productive cough Cardiac: negative for, palpitations, syncope, pain, discomfort, pressure Gastrointestinal: negative for, dysphagia, nausea, vomiting, jaundice, pain, constipation, diarrhea, hematemesis, hematochezia Genitourinary/Sexual: negative for, dysuria, discharge, hesitancy, nocturia, retention, stones, infections, STD's, incontinence Ob/Gyn: negative for, pain Musculoskeletal: negative for, pain, stiffness, swelling, range of motion limitation Hematology: negative for, easy bruising, bleeding Neurologic/Psych: negative for, headaches, seizures, paralysis, weakness, tremor, change in gait, change in sensation, mood swings, depression, anxiety, change in memory  Objective:  Physical Examination:  BP (!) 159/104 (BP Location: Left Arm, Patient Position: Sitting) Comment: Patient she taking her b/p medication this AM, just some  stress of being to MD appointment  Pulse 73   Temp 97.8 F (36.6 C) (Tympanic)   Resp 18   Ht 5\' 5"  (1.651 m)   Wt 196 lb (88.9 kg)   BMI 32.62 kg/m    ECOG Performance Status: 0 - Asymptomatic  General appearance: alert, cooperative and appears stated age HEENT:PERRLA and thyroid without masses Lymph node survey: non-palpable, axillary, inguinal, supraclavicular Cardiovascular: regular rate and rhythm, no murmurs or gallops Respiratory: normal air entry, lungs clear to auscultation and no rales, rhonchi or wheezing Breast exam: not examined. Abdomen: no hernias and well healed incision Back: inspection of back is normal Extremities: extremities normal, atraumatic, no cyanosis or edema Skin exam - normal coloration and turgor, no rashes, no suspicious skin lesions noted. Neurological exam reveals alert, oriented, normal speech, no focal findings or movement disorder noted.  Pelvic: exam chaperoned by nurse;  Vulva: normal appearing vulva with no masses, tenderness or lesions; Vagina: normal vagina; Adnexa: normal adnexa in size, nontender and no  masses; Uterus: uterus is normal size, shape, consistency and nontender; Cervix: slightly enlarged and there is broad red slightly raised appearance, no exophytic cancer; Rectal: not indicated and normal rectal, no masses.    Assessment:  Kandy L Virgo is a 42 y.o. 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  Depth of invasion uncertain and she does not have a grossly visible exophytic lesion.  Cervix is hard and suspect she has significant invasion with a stage IB1 cancer.   Medical co-morbidities complicating care: smokes half pack day.   Plan:   Problem List Items Addressed This Visit    None    Visit Diagnoses    Malignant neoplasm of exocervix ([place])    -  Primary      We discussed options for management including cervical conization to determine depth of invasion and then this would determine  whether simple or radical hysterectomy is most appropriate.  She does not desire future fertility.  Surgery scheduled for 03/23/17 with Dr Theora Gianotti.   The risks of surgery were discussed in detail and she understands these to include infection; failure to remove the entire affected area; recurrence of dysplasia; injury to adjacent organs such as bowel, bladder, blood vessels, ureters, vagina; bleeding which may require blood transfusion; anesthesia risk; thromboembolic events; possible death; unforeseen complications; possible need for further procedures; medical complications such as heart attack, stroke, pleural effusion and pneumonia.  She voiced a clear understanding.  She had the opportunity to ask questions and written informed consent was obtained today.  The patient's diagnosis, an outline of the further diagnostic and laboratory studies which will be required, the recommendation, and alternatives were discussed.  All questions were answered to the patient's satisfaction.  A total of 60 minutes were spent with the patient/family today; 50% was spent in education, counseling and coordination of care for cervical cancer.    Mellody Drown, MD  CC:  Letta Median, [address], [place] [id number] [phone]

## 2017-03-16 NOTE — Progress Notes (Signed)
Chaperoned pelvic exam. Educated further on [facility] biopsy, before procedure, during procedure and post care. I have given her my contact information for any further questions. I have left a voicemail with the OR to arrange for procedure next week if possible. Oncology Nurse Navigator Documentation  Navigator Location: [facility] (03/16/17 1100)   )Navigator Encounter Type: Initial GynOnc (03/16/17 1100)                     Patient Visit Type: GynOnc;Initial (03/16/17 1100)                              Time Spent with Patient: 60 (03/16/17 1100)

## 2017-03-17 NOTE — Progress Notes (Signed)
Surgery has been scheduled for 2/27. Surgery booking request faxed to [facility] with confirmation of receipt. Copy of consent faxed to PAT with confirmation of receipt. Original consent sent to medical records for filing. Ms. Graver called and updated. She will be notified of PAT appointment once available.

## 2017-03-21 ENCOUNTER — Telehealth: Payer: Self-pay

## 2017-03-21 ENCOUNTER — Encounter
Admission: RE | Admit: 2017-03-21 | Discharge: 2017-03-21 | Disposition: A | Discharge status: Home / Self Care | Payer: Medicaid Other | Source: Ambulatory Visit | Attending: Obstetrics and Gynecology | Admitting: Obstetrics and Gynecology

## 2017-03-21 ENCOUNTER — Ambulatory Visit
Admission: RE | Admit: 2017-03-21 | Discharge: 2017-03-21 | Disposition: A | Discharge status: Home / Self Care | Payer: Medicaid Other | Source: Ambulatory Visit | Attending: Obstetrics and Gynecology | Admitting: Obstetrics and Gynecology

## 2017-03-21 ENCOUNTER — Other Ambulatory Visit: Payer: Self-pay

## 2017-03-21 HISTORY — DX: Malignant (primary) neoplasm, unspecified: C80.1

## 2017-03-21 NOTE — Telephone Encounter (Signed)
Ms. Cassidy Mathis notified of PAT appointment 2/25 between 9-1p. Read back performed. Oncology Nurse Navigator Documentation  Navigator Location: [facility] (03/21/17 0900)   )Navigator Encounter Type: Telephone (03/21/17 0900) Telephone: Lahoma Crocker Call;Appt Confirmation/Clarification (03/21/17 0900)                                                  Time Spent with Patient: 15 (03/21/17 0900)

## 2017-03-21 NOTE — Patient Instructions (Signed)
Your procedure is scheduled on: 03-23-17 [facility] Report to Same Day Surgery 2nd floor medical mall [facility] Entrance-take elevator on left to 2nd floor.  Check in with surgery information desk.) To find out your arrival time please call [phone] between 1PM - 3PM on 03-22-17 TUESDAY  Remember: Instructions that are not followed completely may result in serious medical risk, up to and including death, or upon the discretion of your surgeon and anesthesiologist your surgery may need to be rescheduled.    _x___ 1. Do not eat food after midnight the night before your procedure. NO GUM OR CANDY AFTER MIDNIGHT.  You may drink clear liquids up to 2 hours before you are scheduled to arrive at the hospital for your procedure.  Do not drink clear liquids within 2 hours of your scheduled arrival to the hospital.  Clear liquids include  --Water or Apple juice without pulp  --Clear carbohydrate beverage such as ClearFast or Gatorade  --Black Coffee or Clear Tea (No milk, no creamers, do not add anything to the coffee or Tea    __x__ 2. No Alcohol for 24 hours before or after surgery.   __x__3. No Smoking or e-cigarettes for 24 prior to surgery.  Do not use any chewable tobacco products for at least 6 hour prior to surgery   ____  4. Bring all medications with you on the day of surgery if instructed.    __x__ 5. Notify your doctor if there is any change in your medical condition     (cold, fever, infections).    x___6. On the morning of surgery brush your teeth with toothpaste and water.  You may rinse your mouth with mouth wash if you wish.  Do not swallow any toothpaste or mouthwash.   Do not wear jewelry, make-up, hairpins, clips or nail polish.  Do not wear lotions, powders, or perfumes. You may wear deodorant.  Do not shave 48 hours prior to surgery. Men may shave face and neck.  Do not bring valuables to the hospital.    [facility] is not responsible for any belongings or  valuables.               Contacts, dentures or bridgework may not be worn into surgery.  Leave your suitcase in the car. After surgery it may be brought to your room.  For patients admitted to the hospital, discharge time is determined by your treatment team.  _  Patients discharged the day of surgery will not be allowed to drive home.  You will need someone to drive you home and stay with you the night of your procedure.    Please read over the following fact sheets that you were given:   [facility] Preparing for Surgery and or MRSA Information   _x___ TAKE THE FOLLOWING MEDICATION THE MORNING OF SURGERY WITH A SMALL SIP OF WATER. These include:  1. AMLODIPINE  2. YOU MAY TAKE YOUR KLONOPIN IF NEEDED AM OF SURGERY WITH A SMALL SIP OF WATER  3.  4.  5.  6.  ____Fleets enema or Magnesium Citrate as directed.   ____ Use CHG Soap or sage wipes as directed on instruction sheet   ____ Use inhalers on the day of surgery and bring to hospital day of surgery  ____ Stop Metformin and Janumet 2 days prior to surgery.    ____ Take 1/2 of usual insulin dose the night before surgery and none on the morning surgery.   ____ Follow recommendations  from Cardiologist, Pulmonologist or PCP regarding  stopping Aspirin, Coumadin, Plavix ,Eliquis, Effient, or Pradaxa, and Pletal.  X____Stop Anti-inflammatories such as Advil, Aleve, Ibuprofen, Motrin, Naproxen, Naprosyn, Goodies powders or aspirin products NOW-OK to take Tylenol   ____ Stop supplements until after surgery   ____ Bring C-Pap to the hospital.

## 2017-03-22 ENCOUNTER — Other Ambulatory Visit: Payer: Self-pay | Admitting: Nurse Practitioner

## 2017-03-22 ENCOUNTER — Encounter
Admission: RE | Admit: 2017-03-22 | Discharge: 2017-03-22 | Disposition: A | Discharge status: Home / Self Care | Payer: Medicaid Other | Source: Ambulatory Visit | Attending: Obstetrics and Gynecology | Admitting: Obstetrics and Gynecology

## 2017-03-22 DIAGNOSIS — Z01812 Encounter for preprocedural laboratory examination: Secondary | ICD-10-CM | POA: Diagnosis present

## 2017-03-22 LAB — TYPE AND SCREEN
ABO/RH(D): A POS
Antibody Screen: NEGATIVE

## 2017-03-22 LAB — PROTIME-INR
INR: 0.93
PROTHROMBIN TIME: 12.4 s (ref 11.4–15.2)

## 2017-03-22 LAB — APTT: aPTT: 28 seconds (ref 24–36)

## 2017-03-22 NOTE — Addendum Note (Signed)
Addended by: Verlon Au on: 03/22/2017 10:36 PM   Modules accepted: Orders

## 2017-03-22 NOTE — Progress Notes (Signed)
Order placed for rapid HIV lab to be drawn prior to surgery on 03/23/17, per Dr. Theora Gianotti.

## 2017-03-23 ENCOUNTER — Ambulatory Visit
Admission: RE | Admit: 2017-03-23 | Discharge: 2017-03-23 | Disposition: A | Discharge status: Home / Self Care | Payer: Medicaid Other | Source: Ambulatory Visit | Attending: Obstetrics and Gynecology | Admitting: Obstetrics and Gynecology

## 2017-03-23 ENCOUNTER — Ambulatory Visit: Payer: Medicaid Other | Admitting: Certified Registered"

## 2017-03-23 ENCOUNTER — Other Ambulatory Visit: Payer: Self-pay

## 2017-03-23 ENCOUNTER — Encounter
Admission: RE | Disposition: A | Discharge status: Home / Self Care | Payer: Self-pay | Source: Ambulatory Visit | Attending: Obstetrics and Gynecology

## 2017-03-23 ENCOUNTER — Other Ambulatory Visit: Payer: Self-pay | Admitting: Nurse Practitioner

## 2017-03-23 ENCOUNTER — Encounter: Payer: Self-pay | Admitting: Certified Registered"

## 2017-03-23 DIAGNOSIS — Z8249 Family history of ischemic heart disease and other diseases of the circulatory system: Secondary | ICD-10-CM | POA: Insufficient documentation

## 2017-03-23 DIAGNOSIS — K219 Gastro-esophageal reflux disease without esophagitis: Secondary | ICD-10-CM | POA: Diagnosis not present

## 2017-03-23 DIAGNOSIS — F17200 Nicotine dependence, unspecified, uncomplicated: Secondary | ICD-10-CM | POA: Insufficient documentation

## 2017-03-23 DIAGNOSIS — Z801 Family history of malignant neoplasm of trachea, bronchus and lung: Secondary | ICD-10-CM | POA: Insufficient documentation

## 2017-03-23 DIAGNOSIS — C531 Malignant neoplasm of exocervix: Secondary | ICD-10-CM

## 2017-03-23 DIAGNOSIS — Z8349 Family history of other endocrine, nutritional and metabolic diseases: Secondary | ICD-10-CM | POA: Insufficient documentation

## 2017-03-23 DIAGNOSIS — Z79899 Other long term (current) drug therapy: Secondary | ICD-10-CM | POA: Insufficient documentation

## 2017-03-23 DIAGNOSIS — G43909 Migraine, unspecified, not intractable, without status migrainosus: Secondary | ICD-10-CM | POA: Diagnosis not present

## 2017-03-23 DIAGNOSIS — D649 Anemia, unspecified: Secondary | ICD-10-CM | POA: Insufficient documentation

## 2017-03-23 DIAGNOSIS — Z809 Family history of malignant neoplasm, unspecified: Secondary | ICD-10-CM | POA: Diagnosis not present

## 2017-03-23 DIAGNOSIS — C530 Malignant neoplasm of endocervix: Secondary | ICD-10-CM | POA: Insufficient documentation

## 2017-03-23 DIAGNOSIS — Z9049 Acquired absence of other specified parts of digestive tract: Secondary | ICD-10-CM | POA: Insufficient documentation

## 2017-03-23 DIAGNOSIS — F329 Major depressive disorder, single episode, unspecified: Secondary | ICD-10-CM | POA: Insufficient documentation

## 2017-03-23 DIAGNOSIS — I10 Essential (primary) hypertension: Secondary | ICD-10-CM | POA: Insufficient documentation

## 2017-03-23 HISTORY — PX: CERVICAL CONIZATION W/BX: SHX1330

## 2017-03-23 LAB — RAPID HIV SCREEN (HIV 1/2 AB+AG)
HIV 1/2 ANTIBODIES: NONREACTIVE
HIV-1 P24 Antigen - HIV24: NONREACTIVE

## 2017-03-23 LAB — URINE DRUG SCREEN, QUALITATIVE (ARMC ONLY)
Amphetamines, Ur Screen: NOT DETECTED
BENZODIAZEPINE, UR SCRN: NOT DETECTED
Barbiturates, Ur Screen: NOT DETECTED
CANNABINOID 50 NG, UR ~~LOC~~: POSITIVE — AB
Cocaine Metabolite,Ur ~~LOC~~: NOT DETECTED
MDMA (Ecstasy)Ur Screen: NOT DETECTED
Methadone Scn, Ur: NOT DETECTED
OPIATE, UR SCREEN: NOT DETECTED
PHENCYCLIDINE (PCP) UR S: NOT DETECTED
Tricyclic, Ur Screen: NOT DETECTED

## 2017-03-23 LAB — POCT PREGNANCY, URINE: PREG TEST UR: NEGATIVE

## 2017-03-23 SURGERY — CONE BIOPSY, CERVIX
Anesthesia: General

## 2017-03-23 MED ORDER — IODINE STRONG (LUGOLS) 5 % PO SOLN
ORAL | Status: DC | PRN
  Administered 2017-03-23: 2 mL

## 2017-03-23 MED ORDER — FERRIC SUBSULFATE 259 MG/GM EX SOLN
CUTANEOUS | Status: DC | PRN
  Administered 2017-03-23: 1

## 2017-03-23 MED ORDER — LIDOCAINE HCL (CARDIAC) 20 MG/ML IV SOLN
INTRAVENOUS | Status: DC | PRN
  Administered 2017-03-23: 80 mg via INTRAVENOUS

## 2017-03-23 MED ORDER — DEXAMETHASONE SODIUM PHOSPHATE 10 MG/ML IJ SOLN
INTRAMUSCULAR | Status: AC
  Filled 2017-03-23: qty 1

## 2017-03-23 MED ORDER — EPHEDRINE SULFATE 50 MG/ML IJ SOLN
INTRAMUSCULAR | Status: AC
  Filled 2017-03-23: qty 1

## 2017-03-23 MED ORDER — GELATIN ABSORBABLE 100 CM EX MISC
CUTANEOUS | Status: AC
  Filled 2017-03-23: qty 1

## 2017-03-23 MED ORDER — CHLORHEXIDINE GLUCONATE CLOTH 2 % EX PADS: 6.0000 | MEDICATED_PAD | Freq: Once | CUTANEOUS | Status: DC

## 2017-03-23 MED ORDER — EPHEDRINE SULFATE 50 MG/ML IJ SOLN
INTRAMUSCULAR | Status: DC | PRN
  Administered 2017-03-23 (×4): 10 mg via INTRAVENOUS

## 2017-03-23 MED ORDER — GLYCOPYRROLATE 0.2 MG/ML IJ SOLN
INTRAMUSCULAR | Status: AC
  Filled 2017-03-23: qty 1

## 2017-03-23 MED ORDER — PROMETHAZINE HCL 25 MG/ML IJ SOLN
INTRAMUSCULAR | Status: AC
  Administered 2017-03-23: 12.5 mg via INTRAVENOUS
  Filled 2017-03-23: qty 1

## 2017-03-23 MED ORDER — HYDROMORPHONE HCL 1 MG/ML IJ SOLN
0.2500 mg | INTRAMUSCULAR | Status: AC | PRN
  Administered 2017-03-23 (×8): 0.25 mg via INTRAVENOUS

## 2017-03-23 MED ORDER — DEXAMETHASONE SODIUM PHOSPHATE 10 MG/ML IJ SOLN
INTRAMUSCULAR | Status: DC | PRN
  Administered 2017-03-23: 10 mg via INTRAVENOUS

## 2017-03-23 MED ORDER — MIDAZOLAM HCL 2 MG/2ML IJ SOLN
INTRAMUSCULAR | Status: DC | PRN
  Administered 2017-03-23: 4 mg via INTRAVENOUS

## 2017-03-23 MED ORDER — MEPERIDINE HCL 50 MG/ML IJ SOLN: 6.2500 mg | INTRAMUSCULAR | Status: DC | PRN

## 2017-03-23 MED ORDER — LIDOCAINE-EPINEPHRINE 1 %-1:100000 IJ SOLN
INTRAMUSCULAR | Status: AC
  Filled 2017-03-23: qty 1

## 2017-03-23 MED ORDER — CEFAZOLIN SODIUM-DEXTROSE 1-4 GM/50ML-% IV SOLN
1.0000 g | Freq: Once | INTRAVENOUS | Status: AC
  Administered 2017-03-23: 1 g via INTRAVENOUS

## 2017-03-23 MED ORDER — OXYCODONE HCL 5 MG PO TABS: 5.0000 mg | ORAL_TABLET | Freq: Once | ORAL | Status: DC | PRN

## 2017-03-23 MED ORDER — PROPOFOL 10 MG/ML IV BOLUS
INTRAVENOUS | Status: AC
  Filled 2017-03-23: qty 20

## 2017-03-23 MED ORDER — SODIUM CHLORIDE FLUSH 0.9 % IV SOLN
INTRAVENOUS | Status: AC
  Filled 2017-03-23: qty 10

## 2017-03-23 MED ORDER — HYDROMORPHONE HCL 1 MG/ML IJ SOLN
INTRAMUSCULAR | Status: DC | PRN
  Administered 2017-03-23: 1 mg via INTRAVENOUS

## 2017-03-23 MED ORDER — HYDROMORPHONE HCL 1 MG/ML IJ SOLN
INTRAMUSCULAR | Status: AC
  Administered 2017-03-23: 0.25 mg via INTRAVENOUS
  Filled 2017-03-23: qty 1

## 2017-03-23 MED ORDER — ACETAMINOPHEN NICU IV SYRINGE 10 MG/ML
INTRAVENOUS | Status: AC
  Filled 2017-03-23: qty 1

## 2017-03-23 MED ORDER — FAMOTIDINE 20 MG PO TABS
ORAL_TABLET | ORAL | Status: AC
  Administered 2017-03-23: 20 mg via ORAL
  Filled 2017-03-23: qty 1

## 2017-03-23 MED ORDER — ONDANSETRON HCL 4 MG/2ML IJ SOLN
INTRAMUSCULAR | Status: DC | PRN
  Administered 2017-03-23: 4 mg via INTRAVENOUS

## 2017-03-23 MED ORDER — IODINE STRONG (LUGOLS) 5 % PO SOLN
ORAL | Status: AC
  Filled 2017-03-23: qty 1

## 2017-03-23 MED ORDER — HYDROMORPHONE HCL 1 MG/ML IJ SOLN
INTRAMUSCULAR | Status: AC
  Filled 2017-03-23: qty 1

## 2017-03-23 MED ORDER — KETOROLAC TROMETHAMINE 30 MG/ML IJ SOLN
INTRAMUSCULAR | Status: DC | PRN
  Administered 2017-03-23: 30 mg via INTRAVENOUS

## 2017-03-23 MED ORDER — FERRIC SUBSULFATE 259 MG/GM EX SOLN
CUTANEOUS | Status: AC
  Filled 2017-03-23: qty 8

## 2017-03-23 MED ORDER — OXYCODONE HCL 5 MG PO TABS: 5.0000 mg | ORAL_TABLET | ORAL | 0 refills | Status: DC | PRN

## 2017-03-23 MED ORDER — GLYCOPYRROLATE 0.2 MG/ML IJ SOLN
INTRAMUSCULAR | Status: DC | PRN
  Administered 2017-03-23: 0.1 mg via INTRAVENOUS

## 2017-03-23 MED ORDER — LACTATED RINGERS IV SOLN
INTRAVENOUS | Status: DC
  Administered 2017-03-23: 09:00:00 via INTRAVENOUS

## 2017-03-23 MED ORDER — OXYCODONE HCL 5 MG/5ML PO SOLN
5.0000 mg | Freq: Once | ORAL | Status: DC | PRN
Start: 2017-03-23 — End: 2017-03-23

## 2017-03-23 MED ORDER — ONDANSETRON HCL 4 MG/2ML IJ SOLN
INTRAMUSCULAR | Status: AC
  Filled 2017-03-23: qty 2

## 2017-03-23 MED ORDER — GELATIN ABSORBABLE 12-7 MM EX MISC
CUTANEOUS | Status: AC
  Filled 2017-03-23: qty 1

## 2017-03-23 MED ORDER — LIDOCAINE HCL (PF) 2 % IJ SOLN
INTRAMUSCULAR | Status: AC
  Filled 2017-03-23: qty 10

## 2017-03-23 MED ORDER — MIDAZOLAM HCL 2 MG/2ML IJ SOLN
INTRAMUSCULAR | Status: AC
  Filled 2017-03-23: qty 4

## 2017-03-23 MED ORDER — PROPOFOL 10 MG/ML IV BOLUS
INTRAVENOUS | Status: DC | PRN
  Administered 2017-03-23: 200 mg via INTRAVENOUS

## 2017-03-23 MED ORDER — FAMOTIDINE 20 MG PO TABS
20.0000 mg | ORAL_TABLET | Freq: Once | ORAL | Status: AC
  Administered 2017-03-23: 20 mg via ORAL

## 2017-03-23 MED ORDER — CEFAZOLIN SODIUM-DEXTROSE 1-4 GM/50ML-% IV SOLN
INTRAVENOUS | Status: AC
  Filled 2017-03-23: qty 50

## 2017-03-23 MED ORDER — ACETAMINOPHEN 10 MG/ML IV SOLN
INTRAVENOUS | Status: DC | PRN
  Administered 2017-03-23: 1000 mg via INTRAVENOUS

## 2017-03-23 MED ORDER — PROMETHAZINE HCL 25 MG/ML IJ SOLN
6.2500 mg | INTRAMUSCULAR | Status: DC | PRN
  Administered 2017-03-23: 12.5 mg via INTRAVENOUS

## 2017-03-23 MED ORDER — KETOROLAC TROMETHAMINE 30 MG/ML IJ SOLN
INTRAMUSCULAR | Status: AC
  Filled 2017-03-23: qty 1

## 2017-03-23 SURGICAL SUPPLY — 27 items
APPLICATOR COTTON TIP 6IN STRL (MISCELLANEOUS) ×12 IMPLANT
BLADE SURG SZ11 CARB STEEL (BLADE) ×3 IMPLANT
CANISTER SUCT 1200ML W/VALVE (MISCELLANEOUS) ×3 IMPLANT
CATH ROBINSON RED A/P 16FR (CATHETERS) ×3 IMPLANT
CNTNR SPEC 2.5X3XGRAD LEK (MISCELLANEOUS) ×2
CONT SPEC 4OZ STER OR WHT (MISCELLANEOUS) ×1
CONT SPEC 4OZ STRL OR WHT (MISCELLANEOUS) ×2
CONTAINER SPEC 2.5X3XGRAD LEK (MISCELLANEOUS) ×2 IMPLANT
DRSG TELFA 4X3 1S NADH ST (GAUZE/BANDAGES/DRESSINGS) ×3 IMPLANT
ELECT LEEP BALL 5MM 12CM (MISCELLANEOUS) ×3
ELECT REM PT RETURN 9FT ADLT (ELECTROSURGICAL) ×3
ELECTRODE LEEP BALL 5MM 12CM (MISCELLANEOUS) IMPLANT
ELECTRODE REM PT RTRN 9FT ADLT (ELECTROSURGICAL) ×2 IMPLANT
GLOVE BIO SURGEON STRL SZ8 (GLOVE) ×7 IMPLANT
GOWN STRL REUS W/ TWL LRG LVL3 (GOWN DISPOSABLE) ×2 IMPLANT
GOWN STRL REUS W/ TWL XL LVL3 (GOWN DISPOSABLE) ×2 IMPLANT
GOWN STRL REUS W/TWL LRG LVL3 (GOWN DISPOSABLE) ×3
GOWN STRL REUS W/TWL XL LVL3 (GOWN DISPOSABLE) ×3
HEMOSTAT SURGICEL 2X3 (HEMOSTASIS) ×3 IMPLANT
KIT TURNOVER CYSTO (KITS) ×3 IMPLANT
NS IRRIG 500ML POUR BTL (IV SOLUTION) ×3 IMPLANT
PACK BASIN MINOR ARMC (MISCELLANEOUS) ×3 IMPLANT
PACK DNC HYST (MISCELLANEOUS) ×3 IMPLANT
PAD OB MATERNITY 4.3X12.25 (PERSONAL CARE ITEMS) ×3 IMPLANT
PAD PREP 24X41 OB/GYN DISP (PERSONAL CARE ITEMS) ×3 IMPLANT
SUT CHROMIC 1-0 (SUTURE) ×10 IMPLANT
SYR 10ML LL (SYRINGE) ×3 IMPLANT

## 2017-03-23 NOTE — Anesthesia Preprocedure Evaluation (Signed)
Anesthesia Evaluation  Patient identified by MRN, date of birth, ID band Patient awake    Reviewed: Allergy & Precautions, H&P , NPO status , reviewed documented beta blocker date and time   Airway Mallampati: II       Dental no notable dental hx.    Pulmonary Current Smoker,    Pulmonary exam normal        Cardiovascular hypertension, Normal cardiovascular exam     Neuro/Psych  Headaches, PSYCHIATRIC DISORDERS Depression    GI/Hepatic GERD  Controlled,  Endo/Other    Renal/GU      Musculoskeletal   Abdominal   Peds  Hematology  (+) anemia ,   Anesthesia Other Findings   Reproductive/Obstetrics                             Anesthesia Physical Anesthesia Plan  ASA: II  Anesthesia Plan: General LMA   Post-op Pain Management:    Induction:   PONV Risk Score and Plan: Ondansetron, Midazolam and Dexamethasone  Airway Management Planned:   Additional Equipment:   Intra-op Plan:   Post-operative Plan:   Informed Consent: I have reviewed the patients History and Physical, chart, labs and discussed the procedure including the risks, benefits and alternatives for the proposed anesthesia with the patient or authorized representative who has indicated his/her understanding and acceptance.   Dental Advisory Given  Plan Discussed with: CRNA  Anesthesia Plan Comments:         Anesthesia Quick Evaluation

## 2017-03-23 NOTE — Transfer of Care (Signed)
Immediate Anesthesia Transfer of Care Note  Patient: Cassidy Mathis  Procedure(s) Performed: CONIZATION CERVIX WITH BIOPSY (N/A ) EXAM UNDER ANESTHESIA  Patient Location: PACU  Anesthesia Type:General  Level of Consciousness: awake, alert , oriented and patient cooperative  Airway & Oxygen Therapy: Patient Spontanous Breathing and Patient connected to face mask oxygen  Post-op Assessment: Report given to RN, Post -op Vital signs reviewed and stable and Patient moving all extremities X 4  Post vital signs: Reviewed and stable  Last Vitals:  Vitals:   03/23/17 0844  BP: 135/80  Pulse: 75  Resp: 16  Temp: 36.8 C  SpO2: 100%    Last Pain:  Vitals:   03/23/17 0844  TempSrc: Oral  PainSc: 5          Complications: No apparent anesthesia complications

## 2017-03-23 NOTE — Discharge Instructions (Addendum)
AMBULATORY SURGERY  DISCHARGE INSTRUCTIONS   1) The drugs that you were given will stay in your system until tomorrow so for the next 24 hours you should not:  A) Drive an automobile B) Make any legal decisions C) Drink any alcoholic beverage   2) You may resume regular meals tomorrow.  Today it is better to start with liquids and gradually work up to solid foods.  You may eat anything you prefer, but it is better to start with liquids, then soup and crackers, and gradually work up to solid foods.   3) Please notify your doctor immediately if you have any unusual bleeding, trouble breathing, redness and pain at the surgery site, drainage, fever, or pain not relieved by medication.    4) Additional Instructions:        Please contact your physician with any problems or Same Day Surgery at [phone], Monday through Friday 6 am to 4 pm, or [place] at [facility] number at [phone].AMBULATORY SURGERY  DISCHARGE INSTRUCTIONS   5) The drugs that you were given will stay in your system until tomorrow so for the next 24 hours you should not:  D) Drive an automobile E) Make any legal decisions F) Drink any alcoholic beverage   6) You may resume regular meals tomorrow.  Today it is better to start with liquids and gradually work up to solid foods.  You may eat anything you prefer, but it is better to start with liquids, then soup and crackers, and gradually work up to solid foods.   7) Please notify your doctor immediately if you have any unusual bleeding, trouble breathing, redness and pain at the surgery site, drainage, fever, or pain not relieved by medication.    8) Additional Instructions:        Please contact your physician with any problems or Same Day Surgery at [phone], Monday through Friday 6 am to 4 pm, or [place] at [facility] number at [phone].Cervical Conization, Care After This sheet gives you information about how to care  for yourself after your procedure. Your doctor may also give you more specific instructions. If you have problems or questions, contact your doctor. Follow these instructions at home: Medicines  Take over-the-counter and prescription medicines only as told by your doctor.  Do not take aspirin until your doctor says it is okay.  If you take pain medicine: ? You may have constipation. To help treat this, your doctor may tell you to:  Drink enough fluid to keep your pee (urine) clear or pale yellow.  Take medicines.  Eat foods that are high in fiber. These include fresh fruits and vegetables, whole grains, bran, and beans.  Limit foods that are high in fat and sugar. These include fried foods and sweet foods. ? Do not drive or use heavy machines. General instructions  You can eat your usual diet unless your doctor tells you not to do so.  Take showers for the first week. Do not take baths, swim, or use hot tubs until your doctor says it is okay.  Do not douche, use tampons, or have sex until your doctor says it is okay.  For 7-14 days after your procedure, avoid: ? Being very active. ? Exercising. ? Heavy lifting.  Keep all follow-up visits as told by your doctor. This is important. Contact a doctor if:  You have a rash.  You are dizzy or lightheaded.  You feel sick to your stomach (nauseous).  You throw up (  vomit).  You have fluid from your vagina (vaginal discharge) that smells bad. Get help right away if:  There are blood clots coming from your vagina.  You have more bleeding than you would have in a normal period. For example, you soak a pad in less than 1 hour.  You have a fever.  You have more and more cramps.  You pass out (faint).  You have pain when peeing.  Your have a lot of pain.  Your pain gets worse.  Your pain does not get better when you take your medicine.  You have blood in your pee.  You throw up (vomit). Summary  After your  procedure, take over-the-counter and prescription medicines only as told by your doctor.  Do not douche, use tampons, or have sex until your doctor says it is okay.  For about 7-14 days after your procedure, try not to exercise or lift heavy objects.  Get help right away if you have new symptoms, or if your symptoms become worse. This information is not intended to replace advice given to you by your health care provider. Make sure you discuss any questions you have with your health care provider. Document Released: 10/21/2007 Document Revised: 01/14/2016 Document Reviewed: 01/14/2016 Elsevier Interactive Patient Education  2017 [employer].

## 2017-03-23 NOTE — Anesthesia Postprocedure Evaluation (Signed)
Anesthesia Post Note  Patient: Irem L Charette  Procedure(s) Performed: CONIZATION CERVIX WITH BIOPSY (N/A ) EXAM UNDER ANESTHESIA  Patient location during evaluation: PACU Anesthesia Type: General Level of consciousness: awake and alert Pain management: pain level controlled Vital Signs Assessment: post-procedure vital signs reviewed and stable Respiratory status: spontaneous breathing, nonlabored ventilation and respiratory function stable Cardiovascular status: blood pressure returned to baseline and stable Postop Assessment: no apparent nausea or vomiting Anesthetic complications: no     Last Vitals:  Vitals:   03/23/17 1132 03/23/17 1147  BP: 119/71 139/75  Pulse: 85 74  Resp: 18 13  Temp:  36.4 C  SpO2: 95% 99%    Last Pain:  Vitals:   03/23/17 1132  TempSrc:   PainSc: 5                  Vannesa Abair Harvie Heck

## 2017-03-23 NOTE — Progress Notes (Signed)
No vomiting

## 2017-03-23 NOTE — Interval H&P Note (Signed)
History and Physical Interval Note:  03/23/2017 9:20 AM  Cassidy Mathis  has presented today for surgery, with the diagnosis of cervical cancer  The various methods of treatment have been discussed with the patient and family. After consideration of risks, benefits and other options for treatment, the patient has consented to  Procedure(s): CONIZATION CERVIX WITH BIOPSY (N/A) as a surgical intervention .  The patient's history has been reviewed, patient examined, no change in status, stable for surgery.  I have reviewed the patient's chart and labs.  Questions were answered to the patient's satisfaction.   Gillis Ends, MD   [facility] Riser

## 2017-03-23 NOTE — Op Note (Signed)
Operative Note  03/23/2017   PRE-OP DIAGNOSIS: Well differentiated squamous cell cervical cancer    POST-OP DIAGNOSIS: Well differentiated squamous cell cervical cancer, stage I pending final pathology.    SURGEON: Surgeon(s) and Role:   * Dreydon Cardenas Gaetana Michaelis, MD - Primary   ANESTHESIA: Choice   PROCEDURE: Procedure(s):  EUA, CKC, and ECC    ESTIMATED BLOOD LOSS: less than 25 mL   DRAINS: NONE   SPECIMENS: CKC and ECC   COMPLICATIONS: NONE   DISPOSITION: PACU - hemodynamically stable.   CONDITION: stable   IVF: 1000 cc  UOP: ~ 150 cc  INDICATIONS: Well differentiated squamous cell cervical cancer, staging exam and cold knife conization.    FINDINGS: Exam under anesthesia revealed the following:  Vulva: normal appearing vulva with no masses, tenderness or lesions; Vagina: normal vagina; Cervix: enlarged with erythematous ectocervical surface. The cervix is firm/hard to palpation. Adnexa: negative for masses, ovaries not enlarged; Uterus: WNLSS; bilateral parametria smooth; Rectal: confirmatory.  After application of Lugols revealed no abnormal yellow epithelium involving the entire ectocervix.   PROCEDURE IN DETAIL: After informed consent was obtained, the patient was taken to the operating room where anesthesia was obtained without difficulty. The patient was positioned in the dorsal lithotomy position in [place]. Time-out was performed. The patient was examined under anesthesia, and the above findings were noted. She was prepped. The patient was draped using sterile blue towels.A weighted speculum was placed in the vaginal vault and Lugol's was applied with findings as noted above. The anterior lip of the cervix was grasped and 0-Vicryl hemostatic stay sutures were placed at 3 and 9 o'clock. The circumferential cervical incision was made with the knife. Stay silk sutures were placed circumferentially and the 12 o'clock suture was tied down. The specimen was  completed excised. ECC was obtained. Hemostasis of the bed was obtained with cauterization. Monsel's solution and surgicel was placed on the cone bed and secured with the 0-Vicryl stay sutures. he patient tolerated the procedure well. Sponge, lap and needle counts were correct x2. The patient was taken to recovery room in excellent condition.   Antibiotics: Cephalosporin given as indicated by scheduled procedure, Antibiotics given within 1 hour of the start of the procedure, Antibiotics ordered to be discontinued within 24 hours post procedure   Wound: clean contamined   VTE prophylaxis: was ordered perioperatively with SCDs.   Gillis Ends, MD

## 2017-03-23 NOTE — Anesthesia Procedure Notes (Signed)
Procedure Name: LMA Insertion Date/Time: 03/23/2017 9:54 AM Performed by: Nile Riggs, CRNA Pre-anesthesia Checklist: Patient identified, Emergency Drugs available, Suction available, Patient being monitored and Timeout performed Patient Re-evaluated:Patient Re-evaluated prior to induction Oxygen Delivery Method: Circle system utilized Preoxygenation: Pre-oxygenation with 100% oxygen Induction Type: IV induction Ventilation: Mask ventilation without difficulty LMA: LMA inserted LMA Size: 3.5 Number of attempts: 1 Placement Confirmation: positive ETCO2,  CO2 detector and breath sounds checked- equal and bilateral Tube secured with: Tape Dental Injury: Teeth and Oropharynx as per pre-operative assessment

## 2017-03-23 NOTE — Anesthesia Post-op Follow-up Note (Signed)
Anesthesia QCDR form completed.        

## 2017-03-24 ENCOUNTER — Encounter: Payer: Self-pay | Admitting: Obstetrics and Gynecology

## 2017-03-24 LAB — SURGICAL PATHOLOGY

## 2017-03-29 ENCOUNTER — Telehealth: Payer: Self-pay | Admitting: Obstetrics and Gynecology

## 2017-03-29 ENCOUNTER — Telehealth: Payer: Self-pay

## 2017-03-29 NOTE — Telephone Encounter (Signed)
Called and notified Ms. Roscoe of pathology. Dr. Theora Gianotti recommends radical hysterectomy to be performed at [facility]. They will arrange for office visit to educate further on surgery and schedule her surgery date.  Status:  Final result  Visible to patient:  No (Not Released)  Next appt:  04/13/2017 at 12:30 PM in Oncology ([place])  Component 6d ago  SURGICAL PATHOLOGY Surgical Pathology  CASE: [phone]  PATIENT: Cassidy Mathis  Surgical Pathology Report      SPECIMEN SUBMITTED:  A. Cervical cone, cold knife; stitch at 12:00  B. Endocervical curettings   CLINICAL HISTORY:  Squamous cell carcinoma on cervical biopsy and ECC [facility]  [phone], collected 03/03/2017)   PRE-OPERATIVE DIAGNOSIS:  Cervical cancer, need to determine depth and extent of invasion   POST-OPERATIVE DIAGNOSIS:  Same as pre-op      DIAGNOSIS:  A. CERVIX; COLD KNIFE CONE EXCISION:  - SQUAMOUS CELL CARCINOMA, KERATINIZING TYPE, DEPTH OF INVASION 9 MM.  - INVASIVE CARCINOMA BROADLY INVOLVES THE ECTOCERVICAL MARGIN.   B. ENDOCERVIX; CURETTAGE:  - SQUAMOUS CELL CARCINOMA, ONE 0.5 MM DETACHED FRAGMENT.  - SCANT BENIGN ENDOCERVICAL AND ENDOMETRIAL TISSUE.   Surgical Pathology Cancer Case Summary   UTERINE CERVIX: EXCISION  Procedure: Cold knife cone excision  Tumor site: Anterior half, centered at 12 o'clock  Histologic Type: Squamous cell carcinoma, keratinizing  Histologic Grade: G1, well differentiated  Stromal Invasion:       Depth of stromal invasion: 9 mm       Horizontal extent - length: 18 mm at least, extending to margin       Horizontal extent - width: 18 mm (6 of 12 blocks involved)  Margins:      Endocervical margin: negative for invasive carcinoma and SIL       Ectocervical margin: involved by invasive carcinoma, anterior (5  blocks)      Deep margin: involved by invasive carcinoma at ectocervical margin  Lymphovascular Invasion: Not identified      GROSS DESCRIPTION:  A. Labeled: cold knife cervical cone (stitch at 12:00)  Size: 2.2 x 1.5 by up to 2.1 cm with a 0.9 cm slit like os  Ectocervix: very small amount of identifiable mucosal tissue along one  edge, remaining tissue is granular pink-tan  Endocervix: pink-tan   Inking scheme: endocervical margin inked black and remaining margin blue   Block summary:  1-12-radially sectioned and submitted clockwise from 12:00 consecutively   B. Labeled: endocervical curettings   Tissue fragment(s): multiple   Size: aggregate, 1.1 x 0.6 x 0.1 cm   Description: blood clot material   Entirely submitted in 1 cassette(s).     Final Diagnosis performed by Bryan Lemma, MD.  Electronically signed  03/24/2017 7:10:02PM     The electronic signature indicates that the named Attending Pathologist  has evaluated the specimen   Technical component performed at [facility], [address] [place],  [place] [address]  Lab: [phone] Dir: Rush Farmer, MD, MMM   Professional component performed at [facility], [facility],  [place], [place], [place] [address]  Lab: [phone] Dir: Dellia Nims. Reuel Derby, MD      Resulting Agency [facility]      Specimen Collected: 03/23/17 09:29 Last Resulted: 03/24/17 19:25 Lab Flowsheet Order Details View Encounter Lab and Collection Details Routing Result History        Authorizing Provider Information   Name: Gillis Ends, MD Fax: [phone]  Phone: [phone] Pager:   Status of Other Orders  This encounter has no active orders. View All Orders From This Encounter      Oncology Nurse Navigator Documentation  Navigator Location: [facility] Onc (03/29/17 1000)   )Navigator Encounter Type: Telephone;Diagnostic Results (03/29/17 1000)                                                    Time Spent with Patient: 15 (03/29/17 1000)

## 2017-03-29 NOTE — Telephone Encounter (Signed)
I contacted [name] regarding her pathology results noted below. Mariea Clonts, RN, has already contacted her with the results. I wished to provide more information about surgery. Given the extent malignancy I have recommended radical hysterectomy with bilateral salpingectomy. We will also plan for sentinel lymph node dissection and possible node dissection.   She complains   Surgical Pathology  CASE: [id number]  PATIENT: Cassidy Mathis  Surgical Pathology Report      SPECIMEN SUBMITTED:  A. Cervical cone, cold knife; stitch at 12:00  B. Endocervical curettings   CLINICAL HISTORY:  Squamous cell carcinoma on cervical biopsy and ECC [facility]  [phone], collected 03/03/2017)   PRE-OPERATIVE DIAGNOSIS:  Cervical cancer, need to determine depth and extent of invasion   POST-OPERATIVE DIAGNOSIS:  Same as pre-op      DIAGNOSIS:  A. CERVIX; COLD KNIFE CONE EXCISION:  - SQUAMOUS CELL CARCINOMA, KERATINIZING TYPE, DEPTH OF INVASION 9 MM.  - INVASIVE CARCINOMA BROADLY INVOLVES THE ECTOCERVICAL MARGIN.   B. ENDOCERVIX; CURETTAGE:  - SQUAMOUS CELL CARCINOMA, ONE 0.5 MM DETACHED FRAGMENT.  - SCANT BENIGN ENDOCERVICAL AND ENDOMETRIAL TISSUE.   Surgical Pathology Cancer Case Summary   UTERINE CERVIX: EXCISION  Procedure: Cold knife cone excision  Tumor site: Anterior half, centered at 12 o'clock  Histologic Type: Squamous cell carcinoma, keratinizing  Histologic Grade: G1, well differentiated  Stromal Invasion:    Depth of stromal invasion: 9 mm    Horizontal extent - length: 18 mm at least, extending to margin    Horizontal extent - width: 18 mm (6 of 12 blocks involved)  Margins:    Endocervical margin: negative for invasive carcinoma and SIL    Ectocervical margin: involved by invasive carcinoma, anterior (5  blocks)    Deep margin: involved by invasive carcinoma at ectocervical margin  Lymphovascular Invasion: Not identified

## 2017-03-30 ENCOUNTER — Telehealth: Payer: Self-pay | Admitting: *Deleted

## 2017-03-30 ENCOUNTER — Telehealth: Payer: Self-pay

## 2017-03-30 DIAGNOSIS — C531 Malignant neoplasm of exocervix: Secondary | ICD-10-CM

## 2017-03-30 NOTE — Telephone Encounter (Signed)
I have spoken with Dr. Theora Gianotti and she would like to proceed with surgery on 3/21. PET scan within one week. [facility] will reach out to her for clinic appointment and surgery scheduling. Oncology Nurse Navigator Documentation  Navigator Location: [facility] Onc (03/30/17 1200)   )Navigator Encounter Type: Telephone (03/30/17 1200)                                                    Time Spent with Patient: 15 (03/30/17 1200)

## 2017-03-30 NOTE — Telephone Encounter (Signed)
Pt called in about her pain. She says it hurts worse starting yest. She explains the pain as contractions. Sharp pain towards her bottom.  Hurts to cough, and hurts to walk.  When she wipes it is a slightly different color but only slightly. She is not bleeding. She has oxycodone but when she takes it she sleeps all the time so she does not use it til night time. And it helps some with pain but does not make it go away and she cries herself to sleep. Dr. Theora Gianotti wanted to know if she has had a fever and she told me no. She has been using tylenol 1 tablet rotating with 2 ibuprofen and then oxycodone at night. Dr. Theora Gianotti got on the phone with pt and asked again if she has fever and patient said no but states she is not checking for a temperature. She is having chills and then sweating. Dr. Theora Gianotti offered to see her today this afternoon but she could not be here by then. She rec: that pt go to ER. I told her how to get there and Dr.Secord called and spoke to MD in ER to let them know what was going on and left her cell phone for them to call her back once pt is evaluating in ER

## 2017-03-31 ENCOUNTER — Other Ambulatory Visit: Payer: Self-pay | Admitting: Nurse Practitioner

## 2017-03-31 ENCOUNTER — Inpatient Hospital Stay (HOSPITAL_BASED_OUTPATIENT_CLINIC_OR_DEPARTMENT_OTHER): Payer: Medicaid Other | Admitting: Nurse Practitioner

## 2017-03-31 ENCOUNTER — Inpatient Hospital Stay: Payer: Medicaid Other | Attending: Nurse Practitioner

## 2017-03-31 ENCOUNTER — Encounter: Payer: Self-pay | Admitting: Nurse Practitioner

## 2017-03-31 VITALS — BP 137/85 | HR 101 | Temp 98.1°F | Resp 16 | Wt 197.0 lb

## 2017-03-31 DIAGNOSIS — I10 Essential (primary) hypertension: Secondary | ICD-10-CM | POA: Diagnosis not present

## 2017-03-31 DIAGNOSIS — K219 Gastro-esophageal reflux disease without esophagitis: Secondary | ICD-10-CM | POA: Insufficient documentation

## 2017-03-31 DIAGNOSIS — Z801 Family history of malignant neoplasm of trachea, bronchus and lung: Secondary | ICD-10-CM

## 2017-03-31 DIAGNOSIS — Z79899 Other long term (current) drug therapy: Secondary | ICD-10-CM

## 2017-03-31 DIAGNOSIS — N72 Inflammatory disease of cervix uteri: Secondary | ICD-10-CM

## 2017-03-31 DIAGNOSIS — T8140XA Infection following a procedure, unspecified, initial encounter: Secondary | ICD-10-CM

## 2017-03-31 DIAGNOSIS — C531 Malignant neoplasm of exocervix: Secondary | ICD-10-CM

## 2017-03-31 DIAGNOSIS — F329 Major depressive disorder, single episode, unspecified: Secondary | ICD-10-CM | POA: Insufficient documentation

## 2017-03-31 DIAGNOSIS — F17210 Nicotine dependence, cigarettes, uncomplicated: Secondary | ICD-10-CM | POA: Diagnosis not present

## 2017-03-31 LAB — CBC WITH DIFFERENTIAL/PLATELET
Basophils Absolute: 0.1 10*3/uL (ref 0–0.1)
Basophils Relative: 1 %
Eosinophils Absolute: 0.2 10*3/uL (ref 0–0.7)
Eosinophils Relative: 2 %
HEMATOCRIT: 39 % (ref 35.0–47.0)
HEMOGLOBIN: 13.3 g/dL (ref 12.0–16.0)
LYMPHS ABS: 2.5 10*3/uL (ref 1.0–3.6)
LYMPHS PCT: 19 %
MCH: 30.3 pg (ref 26.0–34.0)
MCHC: 34.1 g/dL (ref 32.0–36.0)
MCV: 88.7 fL (ref 80.0–100.0)
Monocytes Absolute: 1.1 10*3/uL — ABNORMAL HIGH (ref 0.2–0.9)
Monocytes Relative: 8 %
NEUTROS ABS: 9.6 10*3/uL — AB (ref 1.4–6.5)
NEUTROS PCT: 70 %
Platelets: 459 10*3/uL — ABNORMAL HIGH (ref 150–440)
RBC: 4.4 MIL/uL (ref 3.80–5.20)
RDW: 13.3 % (ref 11.5–14.5)
WBC: 13.5 10*3/uL — ABNORMAL HIGH (ref 3.6–11.0)

## 2017-03-31 LAB — COMPREHENSIVE METABOLIC PANEL
ALT: 16 U/L (ref 14–54)
AST: 17 U/L (ref 15–41)
Albumin: 3.9 g/dL (ref 3.5–5.0)
Alkaline Phosphatase: 75 U/L (ref 38–126)
Anion gap: 9 (ref 5–15)
BUN: 8 mg/dL (ref 6–20)
CHLORIDE: 101 mmol/L (ref 101–111)
CO2: 25 mmol/L (ref 22–32)
Calcium: 9 mg/dL (ref 8.9–10.3)
Creatinine, Ser: 0.94 mg/dL (ref 0.44–1.00)
GFR calc non Af Amer: >60 mL/min (ref >60)
Glucose, Bld: 102 mg/dL — ABNORMAL HIGH (ref 65–99)
POTASSIUM: 3.7 mmol/L (ref 3.5–5.1)
SODIUM: 135 mmol/L (ref 135–145)
Total Bilirubin: 0.7 mg/dL (ref 0.3–1.2)
Total Protein: 8.2 g/dL — ABNORMAL HIGH (ref 6.5–8.1)

## 2017-03-31 LAB — WET PREP, GENITAL
Clue Cells Wet Prep HPF POC: NONE SEEN
SPERM: NONE SEEN
Trich, Wet Prep: NONE SEEN
YEAST WET PREP: NONE SEEN

## 2017-03-31 MED ORDER — DOXYCYCLINE HYCLATE 100 MG PO TABS: 100.0000 mg | ORAL_TABLET | Freq: Two times a day (BID) | ORAL | 0 refills | Status: AC

## 2017-03-31 MED ORDER — METRONIDAZOLE 500 MG PO TABS: 500.0000 mg | ORAL_TABLET | Freq: Two times a day (BID) | ORAL | 0 refills | Status: AC

## 2017-03-31 NOTE — Progress Notes (Signed)
Symptom Management Consult note [facility]  Telephone:([phone] [phone]  Patient Care Team: Letta Median, MD as PCP - General (Family Medicine) Delman Kitten, MD as Attending Physician (Emergency Medicine) Christene Lye, MD (General Surgery) Clent Jacks, RN as Registered Nurse   Name of the patient: Cassidy Mathis  [id number]  1975/06/16   Date of visit: 03/31/17  Diagnosis-cervical cancer  Chief complaint/ Reason for visit- Fever, Chills, Malodorous Vaginal Discharge  Heme/Onc history: Patient last evaluated by primary gynecology-oncologist, Dr. Fransisca Connors, on 03/16/17.  Patient was initially seen in consultation for cervical cancer.  Pap on 10/17 LSIL + HP HPV.  Failed to show for colposcopy appointment at that time.  Repeat Pap 12/18 ASCUS, + HR HPV 03/03/17--colposcopy with Dr. Schermerhorn-acetowhite epithelium at 12:00, 4:00, 7:00, 10:00 with punctuation at 7:00.  Mosaicism at 4:00.  Biopsy and ECC performed. - Bxs from ectocervix at 10, 12 and ECC well diff squamous cell cancer. Size of biopsies too small to determine depth of invasion.  - Pelvic on 03/16/17 with Dr. Fransisca Connors revealed no grossly visible exophytic lesion but cervix was hard and suspect significant invasion. -03/23/17-exam under anesthesia, cold knife cone, and endocervical curettage.  Pathology revealed:   Cervix: Cold knife cone excision:  -Squamous cell carcinoma keratinizing type, depth of invasion 9 mm. -Invasive carcinoma broadly involves the ectocervical margin  B. Endocervix: Curettage: -Squamous cell carcinoma, 1 0.5 mm detached fragment -Scant benign endocervical and endometrial tissue  Uterine cervix: Excision Procedure: Cold knife cone excision Tumor site: Anterior half, centered at 12:00  Histologic type: Squamous cell carcinoma, keratinizing Histologic Grade: G1, well differentiated Stromal Invation:  -Depth of stromal invasion 9  mm: Horizontal extent-length: 18 mm at least, extending to margin Horizontal extent-width: 18 mm (6 of 12 blocks involved) Margins: -Endocervical margin: Negative for invasive carcinoma and SIL Ectocervical margin: Uninvolved by invasive carcinoma anterior (5 blocks) -Deep margin: Uninvolved by invasive carcinoma of the rectum cervical margin Lymphovascular invasion: Not identified  03/29/2017- Patient was called to discuss results and given extent of her malignancy, Dr. Theora Gianotti recommended radical hysterectomy with bilateral salpingectomy and plans for sentinel lymph node dissection and possible node dissection.  Plans were made for surgery at [facility] on 04/14/17.  PET and CT scheduled prior at [facility].   Comorbidities complicating care: Current smoker   Interval history-patient presents to symptom management clinic for complaints of pain.  Symptoms began approximately 1-2 days ago with intensity slightly worse than previously.  Localizes pain to vagina radiating to buttocks.  Describes it as contraction-like pain.  Rates it as severe, 9 of 10.  Pain improves with oxycodone.  Has been alternating Tylenol and ibuprofen during the day.  Associated symptoms: Fever, chills, sweats.  Endorses malodorous and discolored vaginal discharge increasing in amount since surgery. Discussed symptoms with Dr. Theora Gianotti yesterday who advised patient be evaluated in ER.  Patient declined due to transportation concerns.   ECOG FS:1 - Symptomatic but completely ambulatory  Review of systems- Review of Systems  Constitutional: Positive for chills, fever and malaise/fatigue.  HENT: Negative.   Eyes: Negative.   Respiratory: Negative.   Cardiovascular: Negative.   Gastrointestinal: Positive for abdominal pain.  Genitourinary: Negative.   Musculoskeletal: Negative.   Skin: Negative.   Neurological: Negative.   Endo/Heme/Allergies: Negative.   Psychiatric/Behavioral: The patient is nervous/anxious.    Current treatment-  post CKC; surgery planned at [facility] on 04/14/17  No Known Allergies  Past Medical History:  Diagnosis  Date  . Allergy    seasonal  . Anemia   . Cancer ([place])   . Depression   . GERD (gastroesophageal reflux disease)    RARE-NO MEDS  . Headache    MIGRAINES  . Hypertension    PT STATES SHE IS SUPPOSED TO BE TAKING LISINOPRIL-HCTZ BUT HAS BEEN OUT "FOR A WHILE" NEEDS TO GET ANOTHER PRESCRIPTION FROM HER PCP  . Pancreatitis   . Pilonidal cyst    TOOK ANTIBIOTIC THIS MONTH AND IT IS RESOLVED    Past Surgical History:  Procedure Laterality Date  . CERVICAL CONIZATION W/BX N/A 03/23/2017   Procedure: CONIZATION CERVIX WITH BIOPSY;  Surgeon: Gillis Ends, MD;  Location: [facility] ORS;  Service: Gynecology;  Laterality: N/A;  . CHOLECYSTECTOMY  10/21/2015   Procedure: LAPAROSCOPIC CHOLECYSTECTOMY WITH INTRAOPERATIVE CHOLANGIOGRAM;  Surgeon: Jules Husbands, MD;  Location: [facility] ORS;  Service: General;;  . CHOLECYSTECTOMY Bilateral   . CYST EXCISION     tongue AND WRIST  . EPIGASTRIC HERNIA REPAIR N/A 04/18/2015   Procedure: HERNIA REPAIR EPIGASTRIC ADULT;  Surgeon: Jules Husbands, MD;  Location: [facility] ORS;  Service: General;  Laterality: N/A;  . ERCP N/A 12/21/2016   Procedure: ENDOSCOPIC RETROGRADE CHOLANGIOPANCREATOGRAPHY (ERCP);  Surgeon: [name], MD;  Location: [facility] ENDOSCOPY;  Service: Endoscopy;  Laterality: N/A;  . HERNIA REPAIR    . TUBAL LIGATION      Social History   Socioeconomic History  . Marital status: Single    Spouse name: Not on file  . Number of children: Not on file  . Years of education: Not on file  . Highest education level: Not on file  Social Needs  . Financial resource strain: Not on file  . Food insecurity - worry: Not on file  . Food insecurity - inability: Not on file  . Transportation needs - medical: Not on file  . Transportation needs - non-medical: Not on file  Occupational History  . Not on file  Tobacco Use  . Smoking status: Current  Every Day Smoker    Packs/day: 0.50    Years: 12.00    Pack years: 6.00    Types: Cigarettes  . Smokeless tobacco: Never Used  Substance and Sexual Activity  . Alcohol use: No    Alcohol/week: 0.0 oz    Frequency: Never  . Drug use: Yes    Types: Marijuana    Comment: "not often"  . Sexual activity: Not on file  Other Topics Concern  . Not on file  Social History Narrative  . Not on file    Family History  Problem Relation Age of Onset  . Hypertension Mother   . Gallstones Mother   . Hypertension Father   . Gout Father   . Cancer Paternal Uncle        lung  . Cancer Maternal Grandmother        not sure what type of cancer  . Cancer Maternal Grandfather        not sure what type of cancer  . Cancer Paternal Grandmother        not sure what type of cancer  . Cancer Paternal Grandfather        not sure what type of cancer    Current Outpatient Medications:  .  amLODipine (NORVASC) 5 MG tablet, Take 5 mg by mouth daily after lunch. , Disp: , Rfl:  .  lisinopril (PRINIVIL,ZESTRIL) 20 MG tablet, Take 1 tablet (20 mg total) by mouth daily. (  Patient taking differently: Take 20 mg by mouth daily after lunch. ), Disp: 30 tablet, Rfl: 1 .  oxyCODONE (OXY IR/ROXICODONE) 5 MG immediate release tablet, Take 1 tablet (5 mg total) by mouth every 4 (four) hours as needed for severe pain., Disp: 20 tablet, Rfl: 0 .  clonazePAM (KLONOPIN) 0.5 MG tablet, Take 0.5 mg by mouth 2 (two) times daily as needed for anxiety., Disp: , Rfl:   Physical exam:  Vitals:   03/31/17 1056  BP: 137/85  Pulse: (!) 101  Resp: 16  Temp: 98.1 F (36.7 C)  TempSrc: Tympanic  Weight: 197 lb (89.4 kg)   Physical Exam  Constitutional: She is oriented to person, place, and time and well-developed, well-nourished, and in no distress. No distress.  HENT:  Head: Normocephalic and atraumatic.  Eyes: Conjunctivae are normal. No scleral icterus.  Cardiovascular: Regular rhythm and normal heart sounds.  Tachycardia present.  Pulmonary/Chest: Effort normal and breath sounds normal. No respiratory distress.  Abdominal: Soft. Bowel sounds are normal.  Genitourinary: Cervix exhibits motion tenderness. Vaginal discharge found.  Genitourinary Comments: Vulva: normal appearing w/o tenderness, erythema, or lesions. Moderate amount of gray/black, malodorous discharge from cervix. Mild cervical erythema. Monsels present. Post-operative changes. CMT +.   Musculoskeletal: She exhibits no edema.  Neurological: She is alert and oriented to person, place, and time.  Psychiatric: Her mood appears anxious.   Pelvis exam chaperoned by Mariea Clonts, RN  CMP Latest Ref Rng & Units 03/31/2017  Glucose 65 - 99 mg/dL 102(H)  BUN 6 - 20 mg/dL 8  Creatinine 0.44 - 1.00 mg/dL 0.94  Sodium 135 - 145 mmol/L 135  Potassium 3.5 - 5.1 mmol/L 3.7  Chloride 101 - 111 mmol/L 101  CO2 22 - 32 mmol/L 25  Calcium 8.9 - 10.3 mg/dL 9.0  Total Protein 6.5 - 8.1 g/dL 8.2(H)  Total Bilirubin 0.3 - 1.2 mg/dL 0.7  Alkaline Phos 38 - 126 U/L 75  AST 15 - 41 U/L 17  ALT 14 - 54 U/L 16   CBC Latest Ref Rng & Units 03/31/2017  WBC 3.6 - 11.0 K/uL 13.5(H)  Hemoglobin 12.0 - 16.0 g/dL 13.3  Hematocrit 35.0 - 47.0 % 39.0  Platelets 150 - 440 K/uL 459(H)   Yeast Wet Prep HPF POC NONE SEEN NONE SEEN   Trich, Wet Prep NONE SEEN NONE SEEN   Clue Cells Wet Prep HPF POC NONE SEEN NONE SEEN   WBC, Wet Prep HPF POC NONE SEEN MANY Abnormal    Sperm  NONE SEEN   Comment: Performed at [facility] [address] [place], [place] [address]  Resulting Agency  [facility]      Specimen Collected: 03/31/17 11:40 Last Resulted: 03/31/17 12:17       Assessment and plan- Patient is a 42 y.o. female who presents to symptom management clinic for pelvic pain after surgery.   1. Cervical Cancer- clinical stage I s/p EUA, CKC, EGG on 03/23/17 with Dr. Theora Gianotti. Final pathology revealing well differentiated squamous cell carcinoma,  keratinizing type, G1.  Dr. Theora Gianotti recommended radical hysterectomy with bilateral salpingectomy with sentinel lymph node dissection, to be performed at [facility].  Case to be discussed at tumor conference.  Plan for CT and PET prior to surgery.  Given acute infection, will delay imaging. Imaging will need to be completed prior to surgery. Plan discussed and approved with Dr. Theora Gianotti.  2.  Postoperative infection/Cervicitis- Afebrile today. WBC 13.5, ANC 9.6. Malodorous cervical discharge and CMT.Wet prep +  WBCs. Discussed with Dr. Theora Gianotti who recommended Metronidazole 500mg  PO BID x 10 days and Doxycycline 100mg  PO BID x 10 days. Will recheck labs in 1 week.   Visit Diagnosis 1. Malignant neoplasm of exocervix ([place])   2. Post-operative infection   3. Cervicitis     Patient expressed understanding and was in agreement with this plan. She also understands that She can call clinic at any time with any questions, concerns, or complaints. Patient advised to notify the clinic if there is no improvement in symptoms or if symptoms worsen in next 3-4 days.   A total of (30) minutes of face-to-face time was spent with this patient with greater than 50% of that time in counseling and care-coordination.  Beckey Rutter, DNP, AGNP-C [place] [facility] [phone] (office) 03/31/17 3:43 PM

## 2017-04-01 ENCOUNTER — Ambulatory Visit: Payer: Medicaid Other

## 2017-04-04 ENCOUNTER — Inpatient Hospital Stay (HOSPITAL_BASED_OUTPATIENT_CLINIC_OR_DEPARTMENT_OTHER): Payer: Medicaid Other | Admitting: Nurse Practitioner

## 2017-04-04 ENCOUNTER — Inpatient Hospital Stay: Payer: Medicaid Other

## 2017-04-04 VITALS — BP 141/90 | HR 71 | Temp 98.1°F | Resp 18

## 2017-04-04 DIAGNOSIS — F17210 Nicotine dependence, cigarettes, uncomplicated: Secondary | ICD-10-CM | POA: Diagnosis not present

## 2017-04-04 DIAGNOSIS — F329 Major depressive disorder, single episode, unspecified: Secondary | ICD-10-CM | POA: Diagnosis not present

## 2017-04-04 DIAGNOSIS — K219 Gastro-esophageal reflux disease without esophagitis: Secondary | ICD-10-CM

## 2017-04-04 DIAGNOSIS — I10 Essential (primary) hypertension: Secondary | ICD-10-CM

## 2017-04-04 DIAGNOSIS — Z79899 Other long term (current) drug therapy: Secondary | ICD-10-CM

## 2017-04-04 DIAGNOSIS — T8140XA Infection following a procedure, unspecified, initial encounter: Secondary | ICD-10-CM | POA: Diagnosis not present

## 2017-04-04 DIAGNOSIS — N72 Inflammatory disease of cervix uteri: Secondary | ICD-10-CM | POA: Diagnosis not present

## 2017-04-04 DIAGNOSIS — C531 Malignant neoplasm of exocervix: Secondary | ICD-10-CM

## 2017-04-04 DIAGNOSIS — Z801 Family history of malignant neoplasm of trachea, bronchus and lung: Secondary | ICD-10-CM

## 2017-04-04 LAB — CBC WITH DIFFERENTIAL/PLATELET
BASOS ABS: 0 10*3/uL (ref 0–0.1)
Basophils Relative: 0 %
EOS PCT: 2 %
Eosinophils Absolute: 0.3 10*3/uL (ref 0–0.7)
HEMATOCRIT: 34.4 % — AB (ref 35.0–47.0)
Hemoglobin: 12 g/dL (ref 12.0–16.0)
Lymphocytes Relative: 34 %
Lymphs Abs: 3.7 10*3/uL — ABNORMAL HIGH (ref 1.0–3.6)
MCH: 31 pg (ref 26.0–34.0)
MCHC: 34.9 g/dL (ref 32.0–36.0)
MCV: 89 fL (ref 80.0–100.0)
MONO ABS: 0.7 10*3/uL (ref 0.2–0.9)
MONOS PCT: 7 %
NEUTROS ABS: 6.2 10*3/uL (ref 1.4–6.5)
Neutrophils Relative %: 57 %
Platelets: 440 10*3/uL (ref 150–440)
RBC: 3.87 MIL/uL (ref 3.80–5.20)
RDW: 13.2 % (ref 11.5–14.5)
WBC: 10.9 10*3/uL (ref 3.6–11.0)

## 2017-04-06 DIAGNOSIS — Z720 Tobacco use: Secondary | ICD-10-CM | POA: Insufficient documentation

## 2017-04-06 DIAGNOSIS — C538 Malignant neoplasm of overlapping sites of cervix uteri: Secondary | ICD-10-CM | POA: Insufficient documentation

## 2017-04-07 ENCOUNTER — Encounter: Payer: Self-pay | Admitting: Nurse Practitioner

## 2017-04-07 ENCOUNTER — Telehealth: Payer: Self-pay

## 2017-04-07 NOTE — Telephone Encounter (Signed)
Received notification from [facility] that Ms. Mccartt did not show for her appointment today. I called Ms. Belfield and she stated she knew and she had forgotten about the appointment. She stated they are going to call her back with a new date/time. Oncology Nurse Navigator Documentation  Navigator Location: [facility] On[facility] (04/07/17 1500)   )Navigator Encounter Type: Telephone (04/07/17 1500) Telephone: Lahoma Crocker Call;Appt Confirmation/Clarification (04/07/17 1500)                                                  Time Spent with Patient: 15 (04/07/17 1500)

## 2017-04-12 ENCOUNTER — Ambulatory Visit: Payer: Medicaid Other

## 2017-04-12 ENCOUNTER — Encounter: Payer: Self-pay | Admitting: Nurse Practitioner

## 2017-04-12 ENCOUNTER — Ambulatory Visit
Admission: RE | Admit: 2017-04-12 | Discharge: 2017-04-12 | Disposition: A | Discharge status: Home / Self Care | Payer: Medicaid Other | Source: Ambulatory Visit | Attending: Obstetrics and Gynecology | Admitting: Obstetrics and Gynecology

## 2017-04-12 DIAGNOSIS — C538 Malignant neoplasm of overlapping sites of cervix uteri: Secondary | ICD-10-CM | POA: Diagnosis not present

## 2017-04-12 DIAGNOSIS — M899 Disorder of bone, unspecified: Secondary | ICD-10-CM | POA: Insufficient documentation

## 2017-04-12 MED ORDER — IOPAMIDOL (ISOVUE-300) INJECTION 61%
100.0000 mL | Freq: Once | INTRAVENOUS | Status: AC | PRN
  Administered 2017-04-12: 100 mL via INTRAVENOUS

## 2017-04-12 NOTE — Progress Notes (Signed)
Symptom Management Consult note [facility]  Telephone:([phone] [phone]  Patient Care Team: Letta Median, MD as PCP - General (Family Medicine) Delman Kitten, MD as Attending Physician (Emergency Medicine) Christene Lye, MD (General Surgery) Clent Jacks, RN as Registered Nurse   Name of the patient: Cassidy Mathis  [id number]  [birth year]   Date of visit: 04/12/17  Diagnosis- cervical cancer  Chief complaint/ Reason for visit- Follow-up for infection post-CKC  Heme/Onc history: Patient last evaluated by primary gynecology-oncologist, Dr. Fransisca Connors, on 03/16/17. Patient was initially seen in consultation for cervical cancer.  Pap on 10/17 LSIL + HP HPV.  Failed to show for colposcopy appointment at that time.  Repeat Pap 12/18 ASCUS, + HR HPV 03/03/17--colposcopy with Dr. Schermerhorn-acetowhite epithelium at 12:00, 4:00, 7:00, 10:00 with punctuation at 7:00.  Mosaicism at 4:00.  Biopsy and ECC performed. - Bxs from ectocervix at 10, 12 and ECC well diff squamous cell cancer. Size of biopsies too small to determine depth of invasion.  - Pelvic on 03/16/17 with Dr. Fransisca Connors revealed no grossly visible exophytic lesion but cervix was hard and suspect significant invasion. -03/23/17-exam under anesthesia, cold knife cone, and endocervical curettage.  Pathology revealed:   Cervix: Cold knife cone excision:  -Squamous cell carcinoma keratinizing type, depth of invasion 9 mm. -Invasive carcinoma broadly involves the ectocervical margin  B. Endocervix: Curettage: -Squamous cell carcinoma, 1 0.5 mm detached fragment -Scant benign endocervical and endometrial tissue  Uterine cervix: Excision Procedure: Cold knife cone excision Tumor site: Anterior half, centered at 12:00  Histologic type: Squamous cell carcinoma, keratinizing Histologic Grade: G1, well differentiated Stromal Invation:  -Depth of stromal invasion 9 mm: Horizontal  extent-length: 18 mm at least, extending to margin Horizontal extent-width: 18 mm (6 of 12 blocks involved) Margins: -Endocervical margin: Negative for invasive carcinoma and SIL Ectocervical margin: Uninvolved by invasive carcinoma anterior (5 blocks) -Deep margin: Uninvolved by invasive carcinoma of the rectum cervical margin Lymphovascular invasion: Not identified  03/29/2017- Patient was called to discuss results and given extent of her malignancy, Dr. Theora Gianotti recommended radical hysterectomy with bilateral salpingectomy and plans for sentinel lymph node dissection and possible node dissection.  Plans were made for surgery at [facility] on 04/14/17.  PET and CT scheduled prior at [facility].   Comorbidities complicating care: Current smoker   Interval history-patient presents to symptom management clinic for follow-up for cervical infection post-CKC, ECC.  She reports that symptoms have improved and she no longer notices vaginal discharge or abnormal odor.  Pain has improved but persists intermittently.  Worse at night.  Contraction-like pain.  Rates 6 of 10. Requesting refill of oxycodone for pain and sleep. Denies constipation. Tolerating antibiotics well.  States that she has lost her grandmother over the weekend and is grieving.    ECOG FS:1 - Symptomatic but completely ambulatory  Review of systems- Review of Systems  Constitutional: Negative for chills, fever and malaise/fatigue.  HENT: Negative.   Eyes: Negative.   Respiratory: Negative.   Cardiovascular: Negative.   Gastrointestinal: Positive for abdominal pain (6/10).  Genitourinary: Negative.   Musculoskeletal: Negative.   Skin: Negative.   Neurological: Negative.  Negative for weakness.  Endo/Heme/Allergies: Negative.   Psychiatric/Behavioral: The patient is not nervous/anxious.        Tearful d/t passing of her Grandmother yesterday   Current treatment- post CKC; surgery planned at [facility] on 04/14/17  No Known Allergies  Past  Medical History:  Diagnosis Date  . Allergy  seasonal  . Anemia   . Cancer ([place])   . Depression   . GERD (gastroesophageal reflux disease)    RARE-NO MEDS  . Headache    MIGRAINES  . Hypertension    PT STATES SHE IS SUPPOSED TO BE TAKING LISINOPRIL-HCTZ BUT HAS BEEN OUT "FOR A WHILE" NEEDS TO GET ANOTHER PRESCRIPTION FROM HER PCP  . Pancreatitis   . Pilonidal cyst    TOOK ANTIBIOTIC THIS MONTH AND IT IS RESOLVED    Past Surgical History:  Procedure Laterality Date  . CERVICAL CONIZATION W/BX N/A 03/23/2017   Procedure: CONIZATION CERVIX WITH BIOPSY;  Surgeon: Gillis Ends, MD;  Location: [facility] ORS;  Service: Gynecology;  Laterality: N/A;  . CHOLECYSTECTOMY  10/21/2015   Procedure: LAPAROSCOPIC CHOLECYSTECTOMY WITH INTRAOPERATIVE CHOLANGIOGRAM;  Surgeon: Jules Husbands, MD;  Location: [facility] ORS;  Service: General;;  . CHOLECYSTECTOMY Bilateral   . CYST EXCISION     tongue AND WRIST  . EPIGASTRIC HERNIA REPAIR N/A 04/18/2015   Procedure: HERNIA REPAIR EPIGASTRIC ADULT;  Surgeon: Jules Husbands, MD;  Location: [facility] ORS;  Service: General;  Laterality: N/A;  . ERCP N/A 12/21/2016   Procedure: ENDOSCOPIC RETROGRADE CHOLANGIOPANCREATOGRAPHY (ERCP);  Surgeon: [name], MD;  Location: [facility] ENDOSCOPY;  Service: Endoscopy;  Laterality: N/A;  . HERNIA REPAIR    . TUBAL LIGATION      Social History   Socioeconomic History  . Marital status: Single    Spouse name: Not on file  . Number of children: Not on file  . Years of education: Not on file  . Highest education level: Not on file  Social Needs  . Financial resource strain: Not on file  . Food insecurity - worry: Not on file  . Food insecurity - inability: Not on file  . Transportation needs - medical: Not on file  . Transportation needs - non-medical: Not on file  Occupational History  . Not on file  Tobacco Use  . Smoking status: Current Every Day Smoker    Packs/day: 0.50    Years: 12.00    Pack years: 6.00      Types: Cigarettes  . Smokeless tobacco: Never Used  Substance and Sexual Activity  . Alcohol use: No    Alcohol/week: 0.0 oz    Frequency: Never  . Drug use: Yes    Types: Marijuana    Comment: "not often"  . Sexual activity: Not on file  Other Topics Concern  . Not on file  Social History Narrative  . Not on file    Family History  Problem Relation Age of Onset  . Hypertension Mother   . Gallstones Mother   . Hypertension Father   . Gout Father   . Cancer Paternal Uncle        lung  . Cancer Maternal Grandmother        not sure what type of cancer  . Cancer Maternal Grandfather        not sure what type of cancer  . Cancer Paternal Grandmother        not sure what type of cancer  . Cancer Paternal Grandfather        not sure what type of cancer    Current Outpatient Medications:  .  amLODipine (NORVASC) 5 MG tablet, Take 5 mg by mouth daily after lunch. , Disp: , Rfl:  .  clonazePAM (KLONOPIN) 0.5 MG tablet, Take 0.5 mg by mouth 2 (two) times daily as needed for anxiety., Disp: ,  Rfl:  .  lisinopril (PRINIVIL,ZESTRIL) 20 MG tablet, Take 1 tablet (20 mg total) by mouth daily. (Patient taking differently: Take 20 mg by mouth daily after lunch. ), Disp: 30 tablet, Rfl: 1  Physical exam:  Vitals:   04/04/17 1522  BP: (!) 141/90  Pulse: 71  Resp: 18  Temp: 98.1 F (36.7 C)  TempSrc: Oral   Physical Exam  Constitutional: She is oriented to person, place, and time and well-developed, well-nourished, and in no distress. No distress.  HENT:  Head: Normocephalic and atraumatic.  Eyes: Conjunctivae are normal. No scleral icterus.  Neck: Normal range of motion. Neck supple.  Cardiovascular: Normal rate, regular rhythm and normal heart sounds.  Pulmonary/Chest: Effort normal and breath sounds normal. No respiratory distress.  Abdominal: Soft. Bowel sounds are normal.  Musculoskeletal: She exhibits no edema.  Neurological: She is alert and oriented to person, place,  and time.  Psychiatric:  Tearful    CMP Latest Ref Rng & Units 03/31/2017  Glucose 65 - 99 mg/dL 102(H)  BUN 6 - 20 mg/dL 8  Creatinine 0.44 - 1.00 mg/dL 0.94  Sodium 135 - 145 mmol/L 135  Potassium 3.5 - 5.1 mmol/L 3.7  Chloride 101 - 111 mmol/L 101  CO2 22 - 32 mmol/L 25  Calcium 8.9 - 10.3 mg/dL 9.0  Total Protein 6.5 - 8.1 g/dL 8.2(H)  Total Bilirubin 0.3 - 1.2 mg/dL 0.7  Alkaline Phos 38 - 126 U/L 75  AST 15 - 41 U/L 17  ALT 14 - 54 U/L 16   CBC Latest Ref Rng & Units 04/04/2017  WBC 3.6 - 11.0 K/uL 10.9  Hemoglobin 12.0 - 16.0 g/dL 12.0  Hematocrit 35.0 - 47.0 % 34.4(L)  Platelets 150 - 440 K/uL 440   Yeast Wet Prep HPF POC NONE SEEN NONE SEEN   Trich, Wet Prep NONE SEEN NONE SEEN   Clue Cells Wet Prep HPF POC NONE SEEN NONE SEEN   WBC, Wet Prep HPF POC NONE SEEN MANY Abnormal    Sperm  NONE SEEN   Comment: Performed at [facility], [address] [place], [place] [address]  Resulting Agency  [facility]      Specimen Collected: 03/31/17 11:40 Last Resulted: 03/31/17 12:17       Assessment and plan- Patient is a 42 y.o. female who presents to symptom management clinic for follow-up for post-CKC infection/cervicitis.   1. Cervical Cancer- clinical stage I s/p EUA, CKC, EGG on 03/23/17 with Dr. Theora Gianotti. 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  Dr. Theora Gianotti recommended radical hysterectomy with bilateral salpingectomy with sentinel lymph node dissection, to be performed at [facility].  Case to be discussed at tumor conference.  Plan for CT and PET prior to surgery.  Given acute infection, imaging re-scheduled with plan to complete CT and PET prior to surgery.   2.  Postoperative infection/Cervicitis- Afebrile today. WBC 10.9, ANC 6.3. Symptoms improving. Continue Metronidazole 500mg  PO BID and Doxycycline 100mg  PO BID with plan to complete 10 days of therapy. Discussed use of OTC medications for pain.   Visit  Diagnosis 1. 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 Cervicitis    Patient expressed understanding and was in agreement with this plan. She also understands that She can call clinic at any time with any questions, concerns, or complaints. Patient advised to notify the clinic if there is no improvement in symptoms or if symptoms worsen in next 3-4 days.   Ander Purpura  Candise Che, AGNP-C [place] at [facility] [phone] (office) 04/12/17 2:33 PM

## 2017-04-14 HISTORY — PX: SALPINGECTOMY: SHX328

## 2017-04-14 HISTORY — PX: RADICAL HYSTERECTOMY: SHX2283

## 2017-04-18 DIAGNOSIS — K567 Ileus, unspecified: Secondary | ICD-10-CM | POA: Insufficient documentation

## 2017-04-18 DIAGNOSIS — D62 Acute posthemorrhagic anemia: Secondary | ICD-10-CM | POA: Insufficient documentation

## 2017-04-21 ENCOUNTER — Telehealth: Payer: Self-pay

## 2017-04-21 NOTE — Telephone Encounter (Signed)
Called and spoke with Ms. Gann. Voiding trial arranged for 3/29 at 0900. She is also unable to give herself her Lovenox injections and does not have anyone that can inject for her. She can bring them with her and we can educate her on injection. Oncology Nurse Navigator Documentation  Navigator Location: [facility] (04/21/17 1000)   )Navigator Encounter Type: Telephone (04/21/17 1000) Telephone: Outgoing Call (04/21/17 1000)                                                  Time Spent with Patient: 15 (04/21/17 1000)

## 2017-04-22 NOTE — Progress Notes (Signed)
Cassidy Mathis presented for voiding trial. Foley catheter clamped for 30 minutes until urge to void. Catheter removed and she voided without difficulty. Dr. Bertram Savin at [facility] sent notification that she may not have access to anyone who can inject her Lovenox. She states she is unable to inject herself as she may "pass out". Lovenox 40mg  given today in left abdomen. Left forearm with slight swelling and redness. Hard to touch and is tender. Possible previous IV site. Instructed to watch this area for increasing redness/tenderness/swelling and to call [facility] if it worsens. Pathology reviewed with her and copy provided. She has her follow up appointment for 4/24 at 1500. We can cancel [facility] post op appointment. Oncology Nurse Navigator Documentation  Navigator Location: [facility] (04/22/17 0900)   )Navigator Encounter Type: Follow-up Appt (04/22/17 0900)                     Patient Visit Type: GynOnc;Follow-up;Surgery (04/22/17 0900) Treatment Phase: Follow-up (04/22/17 0900) Barriers/Navigation Needs: Education (04/22/17 0900)                          Time Spent with Patient: 60 (04/22/17 0900)

## 2017-04-25 ENCOUNTER — Telehealth: Payer: Self-pay | Admitting: *Deleted

## 2017-04-25 NOTE — Telephone Encounter (Signed)
Patient called and states she had surgery last week and is out of pain medicine and would like a refill. Per chart review, she last had Oxycodone 5 mg prescribed 03/23/17 for # 20 tabs.

## 2017-04-26 NOTE — Telephone Encounter (Signed)
Called patient to advise Cassidy Mathis to call [facility] and she states that they called Cassidy Mathis this morning. Per Care Everywhere she was prescribed Tramadol this morning

## 2017-05-17 DIAGNOSIS — C539 Malignant neoplasm of cervix uteri, unspecified: Secondary | ICD-10-CM | POA: Insufficient documentation

## 2017-05-17 NOTE — Progress Notes (Signed)
Gynecologic Oncology Interval Visit   Referring Provider: Dr Ouida Sills  Chief Concern: cervical cancer  Subjective:  Cassidy Mathis is a 42 y.o. G40P4 female who is seen post-op for cervical cancer.   Today, She reports abdominal pain around her incision, abdominal bloating/distension; decreased appetite, constipation, dysuria, pink vaginal discharge and spotting, and pelvic pain. She does not have fevers, but reports night sweats.   On 04/14/2017- Underwent radical hysterectomy, bilateral salpingectomy, with bilateral pelvic sentinel lymph node mapping, left pelvic sentinel node biopsies x 2, right pelvic lymph node dissection, cystoscopy with Dr. Theora Gianotti at [facility].   Pathology:  A.  Left common iliac sentinel lymph node: 1 lymph node, negative for tumor. B.  Left external iliac sentinel lymph node: 1 lymph node, negative for tumor. C.  Right obturator lymph node: 1 lymph node, negative for tumor.   D.  Lymph node behind right external iliac: 1 lymph node, negative for tumor. E.  Right external iliac lymph node: 1 lymph node, negative for tumor. F.  Right obturator #2 lymph node: 3 lymph nodes, negative for tumor. G.  Left Fallopian tube: Fallopian tube with chronic salpingitis, negative for tumor. H.  Right Fallopian tube: Fallopian tube with paratubal cyst (1.1 cm), negative for tumor. I.  Left vaginal cuff margin: Benign squamous mucosa, negative for tumor. J.  Uterus, cervix, parametrium: Vaginal cuff: Benign squamous mucosa. Cervix: Invasive squamous cell carcinoma, well-differentiated. Tumor measures 4.5 mm in depth and up to 16 mm in lateral extent.  Tumor involves the inner third of the cervical stroma (4.5 mm of 20 mm thickness).  No lymphovascular invasion identified.  The margins are uninvolved.  Immunohistochemistry:  Positive for p16 (strong, diffuse).  This staining profile supports the above diagnosis.  Endometrium: Secretory endometrium. Myometrium: No  specific pathologic change. Serosa: No specific pathologic change. Left parametrium: 1 lymph node, negative for tumor Right parametrium: 1 lymph node, negative for tumor  FIGO stage: 1B1  05/02/2017- Case presented at [facility]. Recommendation was observation as she does not meet [place] Criteria. Continue pap screening per ASCCP Guidelines.     Gynecologic Oncology History:  - PAP 10/17 LSIL +HP HPV.  Failed to show for colposcopy appointment. - 12/18 PAP ASCUS, + HR HPV. - 03/03/17 Colposcopy with Dr Melven Sartorius: there is acetowhite epithelium at 12+4+7+10o"clock. There is puncation at 7 oclock. There is mosacism at 4 oclock. Representative biopsies are done at 12/05/01/08 oclock. An ECC is also done.   - Bxs from ectocervix at 10, 12 and ECC well diff squamous cell cancer. Size of biopsies too small to determine depth of invasion.  -03/16/17 - pelvic exam with Dr. Fransisca Connors revealed no grossly visible exophytic lesion but cervix was hard and suspected significant invasion. At that time, she endorsed history of regular menses with some extended duration periods. Did endorse post-coital spotting.   - 03/23/17- exam under anesthesia, cold knife cone, and endocervical curettage with Dr. Theora Gianotti at [facility]. Pathology:  A. CERVIX; COLD KNIFE CONE EXCISION: - SQUAMOUS CELL CARCINOMA, KERATINIZING TYPE, DEPTH OF INVASION 9 MM. - INVASIVE CARCINOMA BROADLY INVOLVES THE ECTOCERVICAL MARGIN.  B. ENDOCERVIX; CURETTAGE: - SQUAMOUS CELL CARCINOMA, ONE 0.5 MM DETACHED FRAGMENT. - SCANT BENIGN ENDOCERVICAL AND ENDOMETRIAL TISSUE.  Surgical Pathology Cancer Case Summary  UTERINE CERVIX: EXCISION Procedure: Cold knife cone excision Tumor site: Anterior half, centered at 12 o'clock Histologic Type: Squamous cell carcinoma, keratinizing Histologic Grade: G1, well differentiated Stromal Invasion:  Depth of stromal invasion: 9 mm  Horizontal extent -  length: 18 mm at least,  extending to margin  Horizontal extent - width: 18 mm (6 of 12 blocks involved) Margins:  Endocervical margin: negative for invasive carcinoma and SIL  Ectocervical margin: involved by invasive carcinoma, anterior (5 blocks)  Deep margin: involved by invasive carcinoma at ectocervical margin Lymphovascular Invasion: Not identified  03/31/17- Post-operative cervical infection treated with antibiotics     Problem List: Patient Active Problem List   Diagnosis Date Noted  . Cervical cancer, FIGO stage IB1 ([place]) 05/17/2017  . 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 Acute pancreatitis 12/22/2016  . Common bile duct stone   . Pancreatitis 12/16/2016  . 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 Incarcerated epigastric hernia     Past Medical History: Past Medical History:  Diagnosis Date  . Allergy    seasonal  . Anemia   . Cancer ([place])   . Depression   . GERD (gastroesophageal reflux disease)    RARE-NO MEDS  . Headache    MIGRAINES  . Hypertension    PT STATES SHE IS SUPPOSED TO BE TAKING LISINOPRIL-HCTZ BUT HAS BEEN OUT "FOR A WHILE" NEEDS TO GET ANOTHER PRESCRIPTION FROM HER PCP  . Pancreatitis   . Pilonidal cyst    TOOK ANTIBIOTIC THIS MONTH AND IT IS RESOLVED    Past Surgical History: Past Surgical History:  Procedure Laterality Date  . CERVICAL CONIZATION W/BX N/A 03/23/2017   Procedure: CONIZATION CERVIX WITH BIOPSY;  Surgeon: Gillis Ends, MD;  Location: [facility] ORS;  Service: Gynecology;  Laterality: N/A;  . CHOLECYSTECTOMY  10/21/2015   Procedure: LAPAROSCOPIC CHOLECYSTECTOMY WITH INTRAOPERATIVE CHOLANGIOGRAM;  Surgeon: Jules Husbands, MD;  Location: [facility] ORS;  Service: General;;  . CHOLECYSTECTOMY Bilateral   . CYST EXCISION     tongue AND WRIST  . EPIGASTRIC HERNIA REPAIR N/A 04/18/2015   Procedure: HERNIA REPAIR EPIGASTRIC ADULT;  Surgeon: Jules Husbands, MD;  Location: [facility] ORS;  Service: General;   Laterality: N/A;  . ERCP N/A 12/21/2016   Procedure: ENDOSCOPIC RETROGRADE CHOLANGIOPANCREATOGRAPHY (ERCP);  Surgeon: [name], MD;  Location: [facility] ENDOSCOPY;  Service: Endoscopy;  Laterality: N/A;  . HERNIA REPAIR    . TUBAL LIGATION      Family History: Family History  Problem Relation Age of Onset  . Hypertension Mother   . Gallstones Mother   . Hypertension Father   . Gout Father   . Cancer Paternal Uncle        lung  . Cancer Maternal Grandmother        not sure what type of cancer  . Cancer Maternal Grandfather        not sure what type of cancer  . Cancer Paternal Grandmother        not sure what type of cancer  . Cancer Paternal Grandfather        not sure what type of cancer    Social History: Social History   Socioeconomic History  . Marital status: Single    Spouse name: Not on file  . Number of children: Not on file  . Years of education: Not on file  . Highest education level: Not on file  Occupational History  . Not on file  Social Needs  . Financial resource strain: Not on file  . Food insecurity:    Worry: Not on file    Inability: Not on file  . Transportation needs:    Medical: Not on file    Non-medical:  Not on file  Tobacco Use  . Smoking status: Current Every Day Smoker    Packs/day: 0.50    Years: 12.00    Pack years: 6.00    Types: Cigarettes  . Smokeless tobacco: Never Used  Substance and Sexual Activity  . Alcohol use: No    Alcohol/week: 0.0 oz    Frequency: Never  . Drug use: Yes    Types: Marijuana    Comment: "not often"  . Sexual activity: Not on file  Lifestyle  . Physical activity:    Days per week: Not on file    Minutes per session: Not on file  . Stress: Not on file  Relationships  . Social connections:    Talks on phone: Not on file    Gets together: Not on file    Attends religious service: Not on file    Active member of club or organization: Not on file    Attends meetings of clubs or organizations: Not  on file    Relationship status: Not on file  . Intimate partner violence:    Fear of current or ex partner: Not on file    Emotionally abused: Not on file    Physically abused: Not on file    Forced sexual activity: Not on file  Other Topics Concern  . Not on file  Social History Narrative  . Not on file    Allergies: No Known Allergies  Current Medications: Current Outpatient Medications  Medication Sig Dispense Refill  . amLODipine (NORVASC) 5 MG tablet Take 5 mg by mouth daily after lunch.     . lisinopril (PRINIVIL,ZESTRIL) 20 MG tablet Take 1 tablet (20 mg total) by mouth daily. (Patient taking differently: Take 20 mg by mouth daily after lunch. ) 30 tablet 1  . clonazePAM (KLONOPIN) 0.5 MG tablet Take 0.5 mg by mouth 2 (two) times daily as needed for anxiety.     No current facility-administered medications for this visit.     Review of Systems General: Night sweats Skin: No complaints Eyes: No complaints HEENT: No complaints Breasts: No complaints Pulmonary: No complaints Cardiac: No complaints GI: Abdominal bloating, pain, decreased appetite, Constipation GU/Sexual: dysuria, pink vaginal discharge, pelvic pain GYN: No complaints MSK: No complaints Hematology: No complaints Neuro/Psych: No complaints   Objective:  Physical Examination:  BP (!) 142/102 (BP Location: Right Arm, Patient Position: Sitting) Comment: Patient states she has not taking b/p medication this AM  Pulse 77   Temp (!) 97 F (36.1 C)   Resp 18   Ht 5\' 5"  (1.651 m)   Wt 187 lb 3.2 oz (84.9 kg)   BMI 31.15 kg/m    Repeat BP - diastolic BP in 63'F   ECOG Performance Status: 1 - Symptomatic but completely ambulatory  General appearance: alert, cooperative and appears stated age HEENT:PERRLA and thyroid without masses Lymph node survey: non-palpable, axillary, inguinal, supraclavicular Cardiovascular: regular rate and rhythm, no murmurs or gallops Respiratory: normal air entry, lungs  clear to auscultation and no rales, rhonchi or wheezing Breast exam: not examined. Abdomen: no hernias and well healed incision Back: inspection of back is normal Extremities: extremities normal, atraumatic, no cyanosis or edema Skin exam - normal coloration and turgor, no rashes, no suspicious skin lesions noted. Neurological exam reveals alert, oriented, normal speech, no focal findings or movement disorder noted.  Pelvic: exam chaperoned by NP;  Vulva: normal appearing vulva with no masses, tenderness or lesions;  Vagina: Vaginal cuff is healing, there appears  to be a 1.5 cm midline defect with adjacent inflammatory tissue. Probed with Q-tip. Unable to pass into the peritoneal cavity. BME deferred Uterus: Surgically absent;  Cervix: Surgically absent;  Rectal: Confirmatory.     Assessment:  Cassidy Mathis is a 42 y.o. 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 Low risk pathology factors.   Concern for vaginal cuff dehiscence. Afebrile and no evidence of peritonitis.   Dysuria, possible UTI.   Medical co-morbidities complicating care: smokes half pack day, Body mass index is 31.15 kg/m.    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)   Relevant Orders   Urinalysis, Complete w Microscopic     Given low risk pathology factors we are not recommending adjuvant therapy. Concern for vaginal cuff dehiscence, no evidence of peritonitis. We will attempt a trial of conservative therapy. Precautions provided, nothing in vagina, no sexual intercourse, bathtubs, pools, etc. Also discussed risk of bowel prolapse. Will treat with metronidazole  empirically, obtain CBC with differential as well as urinalysis and culture, and repeat exam next week. She should contact us immediately if she experiences worsening pain, increased vaginal discharge, fevers, or other concerns. If CBC elevated we will repeat on Friday. Close follow up with repeat exam in 1 week.   Beckey Rutter, DNP, AGNP-C [place] at [facility] [phone] (work cell) [phone] (office)  I personally reviewed the patient's history, completed key elements of her exam including abdominal and pelvic exam, and was involved in decision making in conjunction with Ms. Allen.   Gillis Ends, MD   CC:  Dr Ouida Sills

## 2017-05-18 ENCOUNTER — Inpatient Hospital Stay: Payer: Medicaid Other | Attending: Obstetrics and Gynecology | Admitting: Obstetrics and Gynecology

## 2017-05-18 ENCOUNTER — Other Ambulatory Visit: Payer: Self-pay

## 2017-05-18 ENCOUNTER — Inpatient Hospital Stay: Payer: Medicaid Other

## 2017-05-18 VITALS — BP 142/102 | HR 77 | Temp 97.0°F | Resp 18 | Ht 65.0 in | Wt 187.2 lb

## 2017-05-18 DIAGNOSIS — C539 Malignant neoplasm of cervix uteri, unspecified: Secondary | ICD-10-CM

## 2017-05-18 DIAGNOSIS — Z90710 Acquired absence of both cervix and uterus: Secondary | ICD-10-CM | POA: Insufficient documentation

## 2017-05-18 DIAGNOSIS — C531 Malignant neoplasm of exocervix: Secondary | ICD-10-CM

## 2017-05-18 DIAGNOSIS — Z90722 Acquired absence of ovaries, bilateral: Secondary | ICD-10-CM | POA: Diagnosis not present

## 2017-05-18 DIAGNOSIS — F17210 Nicotine dependence, cigarettes, uncomplicated: Secondary | ICD-10-CM | POA: Diagnosis not present

## 2017-05-18 DIAGNOSIS — R300 Dysuria: Secondary | ICD-10-CM | POA: Diagnosis not present

## 2017-05-18 LAB — URINALYSIS, COMPLETE (UACMP) WITH MICROSCOPIC
BILIRUBIN URINE: NEGATIVE
Bacteria, UA: NONE SEEN
Glucose, UA: NEGATIVE mg/dL
HGB URINE DIPSTICK: NEGATIVE
Ketones, ur: NEGATIVE mg/dL
LEUKOCYTES UA: NEGATIVE
Nitrite: NEGATIVE
PH: 6 (ref 5.0–8.0)
Protein, ur: NEGATIVE mg/dL
SPECIFIC GRAVITY, URINE: 1.018 (ref 1.005–1.030)

## 2017-05-18 LAB — CBC WITH DIFFERENTIAL/PLATELET
BASOS PCT: 0 %
Basophils Absolute: 0 10*3/uL (ref 0–0.1)
EOS PCT: 13 %
Eosinophils Absolute: 1.2 10*3/uL — ABNORMAL HIGH (ref 0–0.7)
HEMATOCRIT: 34.1 % — AB (ref 35.0–47.0)
Hemoglobin: 11.6 g/dL — ABNORMAL LOW (ref 12.0–16.0)
Lymphocytes Relative: 25 %
Lymphs Abs: 2.3 10*3/uL (ref 1.0–3.6)
MCH: 29.6 pg (ref 26.0–34.0)
MCHC: 34.1 g/dL (ref 32.0–36.0)
MCV: 86.9 fL (ref 80.0–100.0)
MONO ABS: 0.5 10*3/uL (ref 0.2–0.9)
Monocytes Relative: 5 %
NEUTROS ABS: 5.3 10*3/uL (ref 1.4–6.5)
Neutrophils Relative %: 57 %
PLATELETS: 725 10*3/uL — AB (ref 150–440)
RBC: 3.92 MIL/uL (ref 3.80–5.20)
RDW: 14.2 % (ref 11.5–14.5)
WBC: 9.3 10*3/uL (ref 3.6–11.0)

## 2017-05-18 MED ORDER — METRONIDAZOLE 500 MG PO TABS: 500.0000 mg | ORAL_TABLET | Freq: Two times a day (BID) | ORAL | 0 refills | Status: AC

## 2017-05-18 NOTE — Progress Notes (Signed)
Patient does c/o abdomen bloating with pain, decreased appetite, also having constipation, patient also states she has been having vaginal discharge with spotting and pelvis pain.   Repeat b/p 140/90

## 2017-05-18 NOTE — Patient Instructions (Signed)
Stop at the lab today to have your blood drawn. I have sent prescription for Flagyl to your pharmacy. Start that today. Nothing in the vagina. Monitor your temperature at home and call clinic if > 100.5. Alternate Tylenol and Advil for pain. We will have you return to clinic in one week for re-check with Dr. Fransisca Connors and again in 2 weeks to see Dr. Theora Gianotti. It was a pleasure seeing you today and thank you for allowing me to participate in your care. -Beckey Rutter, NP  Metronidazole tablets or capsules What is this medicine? METRONIDAZOLE (me troe NI da zole) is an antiinfective. It is used to treat certain kinds of bacterial and protozoal infections. It will not work for colds, flu, or other viral infections. This medicine may be used for other purposes; ask your health care provider or pharmacist if you have questions. COMMON BRAND NAME(S): Flagyl What should I tell my health care provider before I take this medicine? They need to know if you have any of these conditions: -anemia or other blood disorders -disease of the nervous system -fungal or yeast infection -if you drink alcohol containing drinks -liver disease -seizures -an unusual or allergic reaction to metronidazole, or other medicines, foods, dyes, or preservatives -pregnant or trying to get pregnant -breast-feeding How should I use this medicine? Take this medicine by mouth with a full glass of water. Follow the directions on the prescription label. Take your medicine at regular intervals. Do not take your medicine more often than directed. Take all of your medicine as directed even if you think you are better. Do not skip doses or stop your medicine early. Talk to your pediatrician regarding the use of this medicine in children. Special care may be needed. Overdosage: If you think you have taken too much of this medicine contact a poison control center or emergency room at once. NOTE: This medicine is only for you. Do not share this  medicine with others. What if I miss a dose? If you miss a dose, take it as soon as you can. If it is almost time for your next dose, take only that dose. Do not take double or extra doses. What may interact with this medicine? Do not take this medicine with any of the following medications: -alcohol or any product that contains alcohol -amprenavir oral solution -cisapride -disulfiram -dofetilide -dronedarone -paclitaxel injection -pimozide -ritonavir oral solution -sertraline oral solution -sulfamethoxazole-trimethoprim injection -thioridazine -ziprasidone This medicine may also interact with the following medications: -birth control pills -cimetidine -lithium -other medicines that prolong the QT interval (cause an abnormal heart rhythm) -phenobarbital -phenytoin -warfarin This list may not describe all possible interactions. Give your health care provider a list of all the medicines, herbs, non-prescription drugs, or dietary supplements you use. Also tell them if you smoke, drink alcohol, or use illegal drugs. Some items may interact with your medicine. What should I watch for while using this medicine? Tell your doctor or health care professional if your symptoms do not improve or if they get worse. You may get drowsy or dizzy. Do not drive, use machinery, or do anything that needs mental alertness until you know how this medicine affects you. Do not stand or sit up quickly, especially if you are an older patient. This reduces the risk of dizzy or fainting spells. Avoid alcoholic drinks while you are taking this medicine and for three days afterward. Alcohol may make you feel dizzy, sick, or flushed. If you are being treated for a sexually  transmitted disease, avoid sexual contact until you have finished your treatment. Your sexual partner may also need treatment. What side effects may I notice from receiving this medicine? Side effects that you should report to your doctor or  health care professional as soon as possible: -allergic reactions like skin rash or hives, swelling of the face, lips, or tongue -confusion, clumsiness -difficulty speaking -discolored or sore mouth -dizziness -fever, infection -numbness, tingling, pain or weakness in the hands or feet -trouble passing urine or change in the amount of urine -redness, blistering, peeling or loosening of the skin, including inside the mouth -seizures -unusually weak or tired -vaginal irritation, dryness, or discharge Side effects that usually do not require medical attention (report to your doctor or health care professional if they continue or are bothersome): -diarrhea -headache -irritability -metallic taste -nausea -stomach pain or cramps -trouble sleeping This list may not describe all possible side effects. Call your doctor for medical advice about side effects. You may report side effects to FDA at 1-800-FDA-1088. Where should I keep my medicine? Keep out of the reach of children. Store at room temperature below 25 degrees C (77 degrees F). Protect from light. Keep container tightly closed. Throw away any unused medicine after the expiration date. NOTE: This sheet is a summary. It may not cover all possible information. If you have questions about this medicine, talk to your doctor, pharmacist, or health care provider.  2018 Elsevier/Gold Standard (2012-08-18 14:08:39)

## 2017-05-19 LAB — URINE CULTURE

## 2017-05-25 ENCOUNTER — Inpatient Hospital Stay: Payer: Medicaid Other

## 2017-05-25 ENCOUNTER — Inpatient Hospital Stay: Payer: Medicaid Other | Attending: Obstetrics and Gynecology | Admitting: Obstetrics and Gynecology

## 2017-05-25 VITALS — BP 145/94 | HR 90 | Temp 98.5°F | Resp 18 | Ht 65.0 in | Wt 186.6 lb

## 2017-05-25 DIAGNOSIS — Z90710 Acquired absence of both cervix and uterus: Secondary | ICD-10-CM

## 2017-05-25 DIAGNOSIS — C539 Malignant neoplasm of cervix uteri, unspecified: Secondary | ICD-10-CM

## 2017-05-25 DIAGNOSIS — Z90722 Acquired absence of ovaries, bilateral: Secondary | ICD-10-CM

## 2017-05-25 NOTE — Progress Notes (Signed)
No new Gyn changes, no discharge, no vaginal bleeding, no odor  Patient does c/o abdominal  pain with discomfort

## 2017-05-25 NOTE — Progress Notes (Signed)
Chaperoned pelvic exam. Per. Dr. Fransisca Connors she can come back in 3 weeks vs. one week. Change made to appt. Oncology Nurse Navigator Documentation  Navigator Location: [facility] Onc (05/25/17 1000)   )Navigator Encounter Type: Follow-up Appt (05/25/17 1000)                     Patient Visit Type: GynOnc (05/25/17 1000)                              Time Spent with Patient: 15 (05/25/17 1000)

## 2017-05-25 NOTE — Progress Notes (Signed)
Gynecologic Oncology Interval Visit   Referring Provider: Dr Ouida Sills  Chief Concern: Stage IB1 cervical cancer  Subjective:  Cassidy Mathis is a 42 y.o. G46P4 female who is seen in post-op for cervical cancer.    Today, she reports continued abdominal pain, bloating/distension, decreased appetite, and dysuria.   She was seen last week in clinic for post-op check and symptoms were concerning for infection. At that time she was complaining of similar symptoms with additional constipation, vaginal discharge, spotting, and pain. Additionally she had night sweats. Exam revealed concern for vaginal cuff dehiscence with granulation tissue above defect, however she was afebrile and there was no evidence of peritonitis. She was start on metronidazole 500 BID x 14 days. CBC and UA were unremarkable.    Thinks she is doing well and would like to return to work.  Gynecologic Oncology History:  - PAP 10/17 LSIL +HP HPV.  Failed to show for colposcopy appointment. - 12/18 PAP ASCUS, + HR HPV. - 03/03/17 Colposcopy with Dr Melven Sartorius: there is acetowhite epithelium at 12+4+7+10o"clock. There is puncation at 7 oclock. There is mosacism at 4 oclock. Representative biopsies are done at 12/05/01/08 oclock. An ECC is also done.   - Bxs from ectocervix at 10, 12 and ECC well diff squamous cell cancer. Size of biopsies too small to determine depth of invasion.  -03/16/17 - pelvic exam with Dr. Fransisca Connors revealed no grossly visible exophytic lesion but cervix was hard and suspected significant invasion. At that time, she endorsed history of regular menses with some extended duration periods. Did endorse post-coital spotting.   - 03/23/17- exam under anesthesia, cold knife cone, and endocervical curettage with Dr. Theora Gianotti at [facility]. Pathology:  A. CERVIX; COLD KNIFE CONE EXCISION: - SQUAMOUS CELL CARCINOMA, KERATINIZING TYPE, DEPTH OF INVASION 9 MM. - INVASIVE CARCINOMA BROADLY INVOLVES THE ECTOCERVICAL  MARGIN.  B. ENDOCERVIX; CURETTAGE: - SQUAMOUS CELL CARCINOMA, ONE 0.5 MM DETACHED FRAGMENT. - SCANT BENIGN ENDOCERVICAL AND ENDOMETRIAL TISSUE.  Surgical Pathology Cancer Case Summary  UTERINE CERVIX: EXCISION Procedure: Cold knife cone excision Tumor site: Anterior half, centered at 12 o'clock Histologic Type: Squamous cell carcinoma, keratinizing Histologic Grade: G1, well differentiated Stromal Invasion:  Depth of stromal invasion: 9 mm  Horizontal extent - length: 18 mm at least, extending to margin  Horizontal extent - width: 18 mm (6 of 12 blocks involved) Margins:  Endocervical margin: negative for invasive carcinoma and SIL  Ectocervical margin: involved by invasive carcinoma, anterior (5 blocks)  Deep margin: involved by invasive carcinoma at ectocervical margin Lymphovascular Invasion: Not identified  03/31/17- Post-operative cervical infection treated with antibiotics  On 04/14/2017- Underwent radical hysterectomy, bilateral salpingectomy, with bilateral pelvic sentinel lymph node mapping, left pelvic sentinel node biopsies x 2, right pelvic lymph node dissection, cystoscopy with Dr. Theora Gianotti at [facility].   Pathology:  A.  Left common iliac sentinel lymph node: 1 lymph node, negative for tumor. B.  Left external iliac sentinel lymph node: 1 lymph node, negative for tumor. C.  Right obturator lymph node: 1 lymph node, negative for tumor.   D.  Lymph node behind right external iliac: 1 lymph node, negative for tumor. E.  Right external iliac lymph node: 1 lymph node, negative for tumor. F.  Right obturator #2 lymph node: 3 lymph nodes, negative for tumor. G.  Left Fallopian tube: Fallopian tube with chronic salpingitis, negative for tumor. H.  Right Fallopian tube: Fallopian tube with paratubal cyst (1.1 cm), negative for tumor. I.  Left vaginal cuff margin: Benign  squamous mucosa, negative for tumor. J.  Uterus, cervix, parametrium: Vaginal  cuff: Benign squamous mucosa. Cervix: Invasive squamous cell carcinoma, well-differentiated. Tumor measures 4.5 mm in depth and up to 16 mm in lateral extent.  Tumor involves the inner third of the cervical stroma (4.5 mm of 20 mm thickness).  No lymphovascular invasion identified.  The margins are uninvolved.  Immunohistochemistry:  Positive for p16 (strong, diffuse).  This staining profile supports the above diagnosis.  Endometrium: Secretory endometrium. Myometrium: No specific pathologic change. Serosa: No specific pathologic change. Left parametrium: 1 lymph node, negative for tumor Right parametrium: 1 lymph node, negative for tumor  FIGO stage: 1B1  05/02/2017- Case presented at [place]. Recommendation was observation as she does not meet [place] Criteria. Continue pap screening per ASCCP Guidelines.   Problem List: Patient Active Problem List   Diagnosis Date Noted  . Cervical cancer, FIGO stage IB1 ([place]) 05/17/2017  . 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 Acute pancreatitis 12/22/2016  . Common bile duct stone   . Pancreatitis 12/16/2016  . 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 Incarcerated epigastric hernia     Past Medical History: Past Medical History:  Diagnosis Date  . Allergy    seasonal  . Anemia   . Cancer ([place])   . Depression   . GERD (gastroesophageal reflux disease)    RARE-NO MEDS  . Headache    MIGRAINES  . Hypertension    PT STATES SHE IS SUPPOSED TO BE TAKING LISINOPRIL-HCTZ BUT HAS BEEN OUT "FOR A WHILE" NEEDS TO GET ANOTHER PRESCRIPTION FROM HER PCP  . Pancreatitis   . Pilonidal cyst    TOOK ANTIBIOTIC THIS MONTH AND IT IS RESOLVED    Past Surgical History: Past Surgical History:  Procedure Laterality Date  . ABDOMINAL HYSTERECTOMY    . CERVICAL CONIZATION W/BX N/A 03/23/2017   Procedure: CONIZATION CERVIX WITH BIOPSY;  Surgeon: Gillis Ends, MD;  Location: [facility]  ORS;  Service: Gynecology;  Laterality: N/A;  . CHOLECYSTECTOMY  10/21/2015   Procedure: LAPAROSCOPIC CHOLECYSTECTOMY WITH INTRAOPERATIVE CHOLANGIOGRAM;  Surgeon: Jules Husbands, MD;  Location: [facility] ORS;  Service: General;;  . CHOLECYSTECTOMY Bilateral   . CYST EXCISION     tongue AND WRIST  . EPIGASTRIC HERNIA REPAIR N/A 04/18/2015   Procedure: HERNIA REPAIR EPIGASTRIC ADULT;  Surgeon: Jules Husbands, MD;  Location: [facility] ORS;  Service: General;  Laterality: N/A;  . ERCP N/A 12/21/2016   Procedure: ENDOSCOPIC RETROGRADE CHOLANGIOPANCREATOGRAPHY (ERCP);  Surgeon: [name], MD;  Location: [facility] ENDOSCOPY;  Service: Endoscopy;  Laterality: N/A;  . HERNIA REPAIR    . RADICAL HYSTERECTOMY  04/14/2017  . SALPINGECTOMY Bilateral 04/14/2017  . SENTINEL LYMPH NODE BIOPSY    . TUBAL LIGATION      Family History: Family History  Problem Relation Age of Onset  . Hypertension Mother   . Gallstones Mother   . Hypertension Father   . Gout Father   . Cancer Paternal Uncle        lung  . Cancer Maternal Grandmother        not sure what type of cancer  . Cancer Maternal Grandfather        not sure what type of cancer  . Cancer Paternal Grandmother        not sure what type of cancer  . Cancer Paternal Grandfather        not sure what type of cancer  Social History: Social History   Socioeconomic History  . Marital status: Single    Spouse name: Not on file  . Number of children: Not on file  . Years of education: Not on file  . Highest education level: Not on file  Occupational History  . Not on file  Social Needs  . Financial resource strain: Not on file  . Food insecurity:    Worry: Not on file    Inability: Not on file  . Transportation needs:    Medical: Not on file    Non-medical: Not on file  Tobacco Use  . Smoking status: Current Every Day Smoker    Packs/day: 0.50    Years: 12.00    Pack years: 6.00    Types: Cigarettes  . Smokeless tobacco: Never Used  Substance  and Sexual Activity  . Alcohol use: No    Alcohol/week: 0.0 oz    Frequency: Never  . Drug use: Yes    Types: Marijuana    Comment: "not often"  . Sexual activity: Not on file  Lifestyle  . Physical activity:    Days per week: Not on file    Minutes per session: Not on file  . Stress: Not on file  Relationships  . Social connections:    Talks on phone: Not on file    Gets together: Not on file    Attends religious service: Not on file    Active member of club or organization: Not on file    Attends meetings of clubs or organizations: Not on file    Relationship status: Not on file  . Intimate partner violence:    Fear of current or ex partner: Not on file    Emotionally abused: Not on file    Physically abused: Not on file    Forced sexual activity: Not on file  Other Topics Concern  . Not on file  Social History Narrative  . Not on file    Allergies: No Known Allergies  Current Medications: Current Outpatient Medications  Medication Sig Dispense Refill  . amLODipine (NORVASC) 5 MG tablet Take 5 mg by mouth daily after lunch.     . clonazePAM (KLONOPIN) 0.5 MG tablet Take 0.5 mg by mouth 2 (two) times daily as needed for anxiety.    Marland Kitchen lisinopril (PRINIVIL,ZESTRIL) 20 MG tablet Take 1 tablet (20 mg total) by mouth daily. (Patient taking differently: Take 20 mg by mouth daily after lunch. ) 30 tablet 1  . metroNIDAZOLE (FLAGYL) 500 MG tablet Take 1 tablet (500 mg total) by mouth 2 (two) times daily for 14 days. 28 tablet 0   No current facility-administered medications for this visit.    Review of Systems General: Night sweats Skin: No complaints Eyes: No complaints HEENT: No complaints Breasts: No complaints Pulmonary: No complaints Cardiac: No complaints GI: Abdominal bloating, pain, decreased appetite GU/Sexual: dysuria GYN: No complaints MSK: No complaints Hematology: No complaints Neuro/Psych: No complaints  Objective:  Physical Examination:  BP (!)  145/94 (BP Location: Left Arm, Patient Position: Sitting)   Pulse 90   Temp 98.5 F (36.9 C) (Tympanic)   Resp 18   Ht 5\' 5"  (1.651 m)   Wt 186 lb 9.6 oz (84.6 kg)   SpO2 98%   BMI 31.05 kg/m    ECOG Performance Status: 1 - Symptomatic but completely ambulatory  General appearance: alert, cooperative and appears stated age HEENT:PERRLA and thyroid without masses Lymph node survey: non-palpable, axillary, inguinal, supraclavicular Cardiovascular: regular  rate and rhythm, no murmurs or gallops Respiratory: normal air entry, lungs clear to auscultation and no rales, rhonchi or wheezing Breast exam: not examined. Abdomen: no hernias and well healed incision Back: inspection of back is normal Extremities: extremities normal, atraumatic, no cyanosis or edema Skin exam - normal coloration and turgor, no rashes, no suspicious skin lesions noted. Neurological exam reveals alert, oriented, normal speech, no focal findings or movement disorder noted.  Pelvic: exam chaperoned by RN. Vulva: normal appearing vulva with no masses, tenderness or lesions;  Vagina: Vaginal cuff with 1.5 cm midline defect with evidence of granulation above, some induration at top of cuff likely inflammatory; No evidence of bowel prolapse into vagina. Uterus: Surgically absent; Cervix: Surgically absent; Rectal: Confirmatory.     Assessment:  Cassidy Mathis is a 42 y.o. 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 Low risk pathology factors, so no adjuvant therapy recommended.   Vaginal cuff defect with evidence of inflammation and granulation. No evidence of bowel prolapse. Afebrile. Continue antibiotics to complete 14 days of treatment.   Medical co-morbidities complicating care: smokes half pack day,  Body mass index is 31.05 kg/m.    Plan:   Problem List Items Addressed This Visit      Other   Cervical cancer, FIGO stage IB1 ([place]) - Primary     We will continue conservative therapy of vaginal cuff seperation.  Precautions again provided, nothing in vagina, no sexual intercourse, douching, bathtubs, pools, etc. Also discussed risk of bowel prolapse.  She should contact us immediately if she experiences worsening pain, increased vaginal discharge, fevers, or other concerns.  Close follow up with repeat exam in 3 weeks.    She would like to look for a job, and told her this was fine as long as it did not involve strenuous physical activity.    Beckey Rutter, DNP, AGNP-C [place] at [facility] [phone] (work cell) [phone] (office)  I personally interviewed and examined the patient. Agreed with the above/below plan of care. Patient/family questions were answered.  Mellody Drown, MD  CC:  Dr Ouida Sills

## 2017-06-01 ENCOUNTER — Ambulatory Visit: Payer: Medicaid Other

## 2017-06-15 ENCOUNTER — Inpatient Hospital Stay: Payer: Medicaid Other

## 2017-06-15 ENCOUNTER — Inpatient Hospital Stay (HOSPITAL_BASED_OUTPATIENT_CLINIC_OR_DEPARTMENT_OTHER): Payer: Medicaid Other | Admitting: Obstetrics and Gynecology

## 2017-06-15 VITALS — BP 156/97 | HR 73 | Temp 98.7°F | Resp 18 | Ht 65.0 in | Wt 191.1 lb

## 2017-06-15 DIAGNOSIS — C539 Malignant neoplasm of cervix uteri, unspecified: Secondary | ICD-10-CM

## 2017-06-15 NOTE — Progress Notes (Signed)
Patient c/o abdominal distention/ bloating with soreness.   No discharge, no bleeding, or odor noted

## 2017-06-15 NOTE — Progress Notes (Signed)
Gynecologic Oncology Interval Visit   Referring Provider: Dr Ouida Sills  Chief Concern: Stage IB1 cervical cancer  Subjective:  Cassidy Mathis is a 42 y.o. G26P4 female who is seen for re-evaluation post-operatively after rad hyst for cervical cancer.    Today, she reports continued abdominal pain, bloating/distension, decreased appetite, and allergies. She feels her symptoms are improving.  Comes back for cuff check because it had opened a bit after surgery and she completed a course of antibiotics.  Gynecologic Oncology History:  - PAP 10/17 LSIL +HP HPV.  Failed to show for colposcopy appointment. - 12/18 PAP ASCUS, + HR HPV. - 03/03/17 Colposcopy with Dr Melven Sartorius: there is acetowhite epithelium at 12+4+7+10o"clock. There is puncation at 7 oclock. There is mosacism at 4 oclock. Representative biopsies are done at 12/05/01/08 oclock. An ECC is also done.   - Bxs from ectocervix at 10, 12 and ECC well diff squamous cell cancer. Size of biopsies too small to determine depth of invasion.  -03/16/17 - pelvic exam with Dr. Fransisca Connors revealed no grossly visible exophytic lesion but cervix was hard and suspected significant invasion. At that time, she endorsed history of regular menses with some extended duration periods. Did endorse post-coital spotting.   - 03/23/17- exam under anesthesia, cold knife cone, and endocervical curettage with Dr. Theora Gianotti at [facility]. Pathology:  A. CERVIX; COLD KNIFE CONE EXCISION: - SQUAMOUS CELL CARCINOMA, KERATINIZING TYPE, DEPTH OF INVASION 9 MM. - INVASIVE CARCINOMA BROADLY INVOLVES THE ECTOCERVICAL MARGIN.  B. ENDOCERVIX; CURETTAGE: - SQUAMOUS CELL CARCINOMA, ONE 0.5 MM DETACHED FRAGMENT. - SCANT BENIGN ENDOCERVICAL AND ENDOMETRIAL TISSUE.  Surgical Pathology Cancer Case Summary  UTERINE CERVIX: EXCISION Procedure: Cold knife cone excision Tumor site: Anterior half, centered at 12 o'clock Histologic Type: Squamous cell carcinoma, keratinizing Histologic  Grade: G1, well differentiated Stromal Invasion:  Depth of stromal invasion: 9 mm  Horizontal extent - length: 18 mm at least, extending to margin  Horizontal extent - width: 18 mm (6 of 12 blocks involved) Margins:  Endocervical margin: negative for invasive carcinoma and SIL  Ectocervical margin: involved by invasive carcinoma, anterior (5 blocks)  Deep margin: involved by invasive carcinoma at ectocervical margin Lymphovascular Invasion: Not identified  03/31/17- Post-operative cervical infection treated with antibiotics  On 04/14/2017- Underwent radical hysterectomy, bilateral salpingectomy, with bilateral pelvic sentinel lymph node mapping, left pelvic sentinel node biopsies x 2, right pelvic lymph node dissection, cystoscopy with Dr. Theora Gianotti at [facility].   Pathology:  A.  Left common iliac sentinel lymph node: 1 lymph node, negative for tumor. B.  Left external iliac sentinel lymph node: 1 lymph node, negative for tumor. C.  Right obturator lymph node: 1 lymph node, negative for tumor.   D.  Lymph node behind right external iliac: 1 lymph node, negative for tumor. E.  Right external iliac lymph node: 1 lymph node, negative for tumor. F.  Right obturator #2 lymph node: 3 lymph nodes, negative for tumor. G.  Left Fallopian tube: Fallopian tube with chronic salpingitis, negative for tumor. H.  Right Fallopian tube: Fallopian tube with paratubal cyst (1.1 cm), negative for tumor. I.  Left vaginal cuff margin: Benign squamous mucosa, negative for tumor. J.  Uterus, cervix, parametrium: Vaginal cuff: Benign squamous mucosa. Cervix: Invasive squamous cell carcinoma, well-differentiated. Tumor measures 4.5 mm in depth and up to 16 mm in lateral extent.  Tumor involves the inner third of the cervical stroma (4.5 mm of 20 mm thickness).  No lymphovascular invasion identified.  The margins are uninvolved.  Immunohistochemistry:  Positive for p16 (strong,  diffuse).  This staining profile supports the above diagnosis.  Endometrium: Secretory endometrium. Myometrium: No specific pathologic change. Serosa: No specific pathologic change. Left parametrium: 1 lymph node, negative for tumor Right parametrium: 1 lymph node, negative for tumor  FIGO stage: 1B1  05/02/2017- Case presented at [place]. Recommendation was observation as she does not meet [place] Criteria. Continue pap screening per ASCCP Guidelines.   Post operatively there were initially concerns for infection due to fever, chills, and night sweats. Exam revealed vaginal cuff dehiscence with granulation tissue above defect. She was started on metronidazole 500mg  BID x 14 days. Otherwise she was managed with conservative therapy and provided with vaginal precautions.   Problem List: Patient Active Problem List   Diagnosis Date Noted  . Cervical cancer, FIGO stage IB1 ([place]) 05/17/2017  . Malignant neoplasm of exocervix ([place])   . Acute pancreatitis 12/22/2016  . Common bile duct stone   . Pancreatitis 12/16/2016  . 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 Incarcerated epigastric hernia     Past Medical History: Past Medical History:  Diagnosis Date  . Allergy    seasonal  . Anemia   . Cancer ([place])   . Depression   . GERD (gastroesophageal reflux disease)    RARE-NO MEDS  . Headache    MIGRAINES  . Hypertension    PT STATES SHE IS SUPPOSED TO BE TAKING LISINOPRIL-HCTZ BUT HAS BEEN OUT "FOR A WHILE" NEEDS TO GET ANOTHER PRESCRIPTION FROM HER PCP  . Pancreatitis   . Pilonidal cyst    TOOK ANTIBIOTIC THIS MONTH AND IT IS RESOLVED    Past Surgical History: Past Surgical History:  Procedure Laterality Date  . ABDOMINAL HYSTERECTOMY    . CERVICAL CONIZATION W/BX N/A 03/23/2017   Procedure: CONIZATION CERVIX WITH BIOPSY;  Surgeon: Gillis Ends, MD;  Location: [facility] ORS;  Service: Gynecology;  Laterality:  N/A;  . CHOLECYSTECTOMY  10/21/2015   Procedure: LAPAROSCOPIC CHOLECYSTECTOMY WITH INTRAOPERATIVE CHOLANGIOGRAM;  Surgeon: Jules Husbands, MD;  Location: [facility] ORS;  Service: General;;  . CHOLECYSTECTOMY Bilateral   . CYST EXCISION     tongue AND WRIST  . EPIGASTRIC HERNIA REPAIR N/A 04/18/2015   Procedure: HERNIA REPAIR EPIGASTRIC ADULT;  Surgeon: Jules Husbands, MD;  Location: [facility] ORS;  Service: General;  Laterality: N/A;  . ERCP N/A 12/21/2016   Procedure: ENDOSCOPIC RETROGRADE CHOLANGIOPANCREATOGRAPHY (ERCP);  Surgeon: [name], MD;  Location: [facility] ENDOSCOPY;  Service: Endoscopy;  Laterality: N/A;  . HERNIA REPAIR    . RADICAL HYSTERECTOMY  04/14/2017  . SALPINGECTOMY Bilateral 04/14/2017  . SENTINEL LYMPH NODE BIOPSY    . TUBAL LIGATION      Family History: Family History  Problem Relation Age of Onset  . Hypertension Mother   . Gallstones Mother   . Hypertension Father   . Gout Father   . Cancer Paternal Uncle        lung  . Cancer Maternal Grandmother        not sure what type of cancer  . Cancer Maternal Grandfather        not sure what type of cancer  . Cancer Paternal Grandmother        not sure what type of cancer  . Cancer Paternal Grandfather        not sure what type of cancer    Social History: Social History   Socioeconomic History  . Marital status: Single  Spouse name: Not on file  . Number of children: Not on file  . Years of education: Not on file  . Highest education level: Not on file  Occupational History  . Not on file  Social Needs  . Financial resource strain: Not on file  . Food insecurity:    Worry: Not on file    Inability: Not on file  . Transportation needs:    Medical: Not on file    Non-medical: Not on file  Tobacco Use  . Smoking status: Current Every Day Smoker    Packs/day: 0.50    Years: 12.00    Pack years: 6.00    Types: Cigarettes  . Smokeless tobacco: Never Used  Substance and Sexual Activity  . Alcohol use: No     Alcohol/week: 0.0 oz    Frequency: Never  . Drug use: Yes    Types: Marijuana    Comment: "not often"  . Sexual activity: Not on file  Lifestyle  . Physical activity:    Days per week: Not on file    Minutes per session: Not on file  . Stress: Not on file  Relationships  . Social connections:    Talks on phone: Not on file    Gets together: Not on file    Attends religious service: Not on file    Active member of club or organization: Not on file    Attends meetings of clubs or organizations: Not on file    Relationship status: Not on file  . Intimate partner violence:    Fear of current or ex partner: Not on file    Emotionally abused: Not on file    Physically abused: Not on file    Forced sexual activity: Not on file  Other Topics Concern  . Not on file  Social History Narrative  . Not on file    Allergies: No Known Allergies  Current Medications: Current Outpatient Medications  Medication Sig Dispense Refill  . amLODipine (NORVASC) 5 MG tablet Take 5 mg by mouth daily after lunch.     . lisinopril (PRINIVIL,ZESTRIL) 20 MG tablet Take 1 tablet (20 mg total) by mouth daily. (Patient taking differently: Take 20 mg by mouth daily after lunch. ) 30 tablet 1  . clonazePAM (KLONOPIN) 0.5 MG tablet Take 0.5 mg by mouth 2 (two) times daily as needed for anxiety.     No current facility-administered medications for this visit.    Review of Systems General: No complaints Skin: No complaints Eyes: No complaints HEENT: No complaints Breasts: No complaints Pulmonary: No complaints Cardiac: No complaints GI: Abdominal bloating, pain, decreased appetite GU/Sexual: dysuria GYN: No complaints MSK: No complaints Hematology: No complaints Neuro/Psych: No complaints  Objective:  Physical Examination:  BP (!) 156/97 (BP Location: Right Arm, Patient Position: Sitting) Comment: patient states she has not taking b/p medication this morning  Pulse 73   Temp 98.7 F (37.1  C) (Tympanic)   Resp 18   Ht 5\' 5"  (1.651 m)   Wt 191 lb 1.6 oz (86.7 kg)   SpO2 100%   BMI 31.80 kg/m    ECOG Performance Status: 1 - Symptomatic but completely ambulatory  General appearance: alert, cooperative and appears stated age HEENT:PERRLA and thyroid without masses Lymph node survey: non-palpable, axillary, inguinal, supraclavicular Cardiovascular: regular rate and rhythm, no murmurs or gallops Respiratory: normal air entry, lungs clear to auscultation and no rales, rhonchi or wheezing Breast exam: not examined. Abdomen: no hernias and well healed  incision Back: inspection of back is normal Extremities: extremities normal, atraumatic, no cyanosis or edema Skin exam - normal coloration and turgor, no rashes, no suspicious skin lesions noted. Neurological exam reveals alert, oriented, normal speech, no focal findings or movement disorder noted.  Pelvic: exam chaperoned by NP. Vulva: normal appearing vulva with no masses, tenderness or lesions;  Vagina: Vaginal cuff healed now, but still significant  induration at top of cuff. Uterus: Surgically absent; Cervix: Surgically absent; Rectal: Confirmatory.   Assessment:  Cassidy Mathis is a 42 y.o. 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 Low risk pathology factors, so no adjuvant therapy recommended.   Vaginal cuff defect completely healed now.  Normal post op induration above the cuff.   Medical co-morbidities complicating care: smokes half pack day, Body mass index is 31.8 kg/m.   Plan:   Problem List Items Addressed This Visit    None    She wants to resume intercourse and told her this was fine, but to be gentle at first.    She will return for repeat exam in 6 weeks.    She would like to look for  a job, and told her this was fine as long as it did not involve strenuous physical activity.    Beckey Rutter, DNP, AGNP-C [place] at [facility] [phone] (work cell) [phone] (office)  I personally interviewed and examined the patient. Agreed with the above/below plan of care. Patient/family questions were answered.  Mellody Drown, MD  CC:  Dr Ouida Sills

## 2017-07-21 ENCOUNTER — Encounter: Payer: Self-pay | Admitting: Emergency Medicine

## 2017-07-21 ENCOUNTER — Emergency Department
Admission: EM | Admit: 2017-07-21 | Discharge: 2017-07-21 | Disposition: A | Discharge status: Home / Self Care | Payer: Medicaid Other | Attending: Emergency Medicine | Admitting: Emergency Medicine

## 2017-07-21 DIAGNOSIS — R2243 Localized swelling, mass and lump, lower limb, bilateral: Secondary | ICD-10-CM | POA: Insufficient documentation

## 2017-07-21 DIAGNOSIS — R2233 Localized swelling, mass and lump, upper limb, bilateral: Secondary | ICD-10-CM | POA: Insufficient documentation

## 2017-07-21 DIAGNOSIS — F17210 Nicotine dependence, cigarettes, uncomplicated: Secondary | ICD-10-CM | POA: Diagnosis not present

## 2017-07-21 DIAGNOSIS — Z79899 Other long term (current) drug therapy: Secondary | ICD-10-CM | POA: Insufficient documentation

## 2017-07-21 DIAGNOSIS — R42 Dizziness and giddiness: Secondary | ICD-10-CM | POA: Diagnosis not present

## 2017-07-21 DIAGNOSIS — Z8541 Personal history of malignant neoplasm of cervix uteri: Secondary | ICD-10-CM | POA: Insufficient documentation

## 2017-07-21 DIAGNOSIS — I10 Essential (primary) hypertension: Secondary | ICD-10-CM | POA: Diagnosis not present

## 2017-07-21 DIAGNOSIS — F1210 Cannabis abuse, uncomplicated: Secondary | ICD-10-CM | POA: Diagnosis not present

## 2017-07-21 LAB — URINALYSIS, COMPLETE (UACMP) WITH MICROSCOPIC
BACTERIA UA: NONE SEEN
Bilirubin Urine: NEGATIVE
Glucose, UA: NEGATIVE mg/dL
Hgb urine dipstick: NEGATIVE
KETONES UR: NEGATIVE mg/dL
Leukocytes, UA: NEGATIVE
Nitrite: NEGATIVE
Protein, ur: NEGATIVE mg/dL
Specific Gravity, Urine: 1.017 (ref 1.005–1.030)
pH: 6 (ref 5.0–8.0)

## 2017-07-21 LAB — BASIC METABOLIC PANEL
ANION GAP: 8 (ref 5–15)
BUN: 9 mg/dL (ref 6–20)
CHLORIDE: 105 mmol/L (ref 98–111)
CO2: 24 mmol/L (ref 22–32)
Calcium: 8.7 mg/dL — ABNORMAL LOW (ref 8.9–10.3)
Creatinine, Ser: 0.8 mg/dL (ref 0.44–1.00)
GFR calc Af Amer: >60 mL/min (ref >60)
GFR calc non Af Amer: >60 mL/min (ref >60)
GLUCOSE: 86 mg/dL (ref 70–99)
POTASSIUM: 3.7 mmol/L (ref 3.5–5.1)
Sodium: 137 mmol/L (ref 135–145)

## 2017-07-21 LAB — CBC
HEMATOCRIT: 35.5 % (ref 35.0–47.0)
HEMOGLOBIN: 12.6 g/dL (ref 12.0–16.0)
MCH: 30.8 pg (ref 26.0–34.0)
MCHC: 35.6 g/dL (ref 32.0–36.0)
MCV: 86.6 fL (ref 80.0–100.0)
Platelets: 340 10*3/uL (ref 150–440)
RBC: 4.1 MIL/uL (ref 3.80–5.20)
RDW: 15 % — ABNORMAL HIGH (ref 11.5–14.5)
WBC: 6.2 10*3/uL (ref 3.6–11.0)

## 2017-07-21 LAB — GLUCOSE, CAPILLARY: GLUCOSE-CAPILLARY: 69 mg/dL — AB (ref 70–99)

## 2017-07-21 NOTE — Discharge Instructions (Addendum)
Take your blood pressure medications as prescribed.  Return to the ER for new, worsening, persistent dizziness, lightheadedness, swelling, or any chest pain, difficulty breathing, severe headache or other new or worsening symptoms that concern you.

## 2017-07-21 NOTE — ED Triage Notes (Signed)
Pt reports swelling to hands and feet for a couple of days and today she felt dizzy and like she was going to pass out at work. Pt denies pain, SOB or other sx;s.

## 2017-07-21 NOTE — ED Provider Notes (Signed)
[facility] Emergency Department Provider Note ____________________________________________   First MD Initiated Contact with Patient 07/21/17 1352     (approximate)  I have reviewed the triage vital signs and the nursing notes.   HISTORY  Chief Complaint Dizziness and Leg Swelling    HPI Cassidy Mathis is a 42 y.o. female who presents with lightheadedness, acute onset today while at work, now resolved, and not associated with chest pain, difficulty breathing, headache, or other acute symptoms.  Patient reports some minimal swelling in bilateral upper and lower extremities in the last few days which she associates with elevated blood pressure.  Patient states that she ran out of her lisinopril and was waiting for the refill which is currently in process so she has been off of her medication for a few days.  She came to get checked out because she was concerned her blood pressure might be too high.  Past Medical History:  Diagnosis Date  . Allergy    seasonal  . Anemia   . Cancer ([place])   . Depression   . GERD (gastroesophageal reflux disease)    RARE-NO MEDS  . Headache    MIGRAINES  . Hypertension    PT STATES SHE IS SUPPOSED TO BE TAKING LISINOPRIL-HCTZ BUT HAS BEEN OUT "FOR A WHILE" NEEDS TO GET ANOTHER PRESCRIPTION FROM HER PCP  . Pancreatitis   . Pilonidal cyst    TOOK ANTIBIOTIC THIS MONTH AND IT IS RESOLVED    Patient Active Problem List   Diagnosis Date Noted  . Cervical cancer, FIGO stage IB1 ([place]) 05/17/2017  . Malignant neoplasm of exocervix ([place])   . Acute pancreatitis 12/22/2016  . Common bile duct stone   . Pancreatitis 12/16/2016  . 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 Incarcerated epigastric hernia     Past Surgical History:  Procedure Laterality Date  . ABDOMINAL HYSTERECTOMY    . CERVICAL CONIZATION W/BX N/A 03/23/2017   Procedure: CONIZATION CERVIX WITH BIOPSY;  Surgeon: Gillis Ends, MD;  Location: [facility] ORS;  Service: Gynecology;  Laterality: N/A;  . CHOLECYSTECTOMY  10/21/2015   Procedure: LAPAROSCOPIC CHOLECYSTECTOMY WITH INTRAOPERATIVE CHOLANGIOGRAM;  Surgeon: Jules Husbands, MD;  Location: [facility] ORS;  Service: General;;  . CHOLECYSTECTOMY Bilateral   . CYST EXCISION     tongue AND WRIST  . EPIGASTRIC HERNIA REPAIR N/A 04/18/2015   Procedure: HERNIA REPAIR EPIGASTRIC ADULT;  Surgeon: Jules Husbands, MD;  Location: [facility] ORS;  Service: General;  Laterality: N/A;  . ERCP N/A 12/21/2016   Procedure: ENDOSCOPIC RETROGRADE CHOLANGIOPANCREATOGRAPHY (ERCP);  Surgeon: [name], MD;  Location: [facility] ENDOSCOPY;  Service: Endoscopy;  Laterality: N/A;  . HERNIA REPAIR    . RADICAL HYSTERECTOMY  04/14/2017  . SALPINGECTOMY Bilateral 04/14/2017  . SENTINEL LYMPH NODE BIOPSY    . TUBAL LIGATION      Prior to Admission medications   Medication Sig Start Date End Date Taking? Authorizing Provider  amLODipine (NORVASC) 5 MG tablet Take 5 mg by mouth daily after lunch.     [provider]  clonazePAM (KLONOPIN) 0.5 MG tablet Take 0.5 mg by mouth 2 (two) times daily as needed for anxiety.    [provider]  lisinopril (PRINIVIL,ZESTRIL) 20 MG tablet Take 1 tablet (20 mg total) by mouth daily. Patient taking differently: Take 20 mg by mouth daily after lunch.  12/24/16   Fritzi Mandes, MD    Allergies Patient has no known allergies.  Family  History  Problem Relation Age of Onset  . Hypertension Mother   . Gallstones Mother   . Hypertension Father   . Gout Father   . Cancer Paternal Uncle        lung  . Cancer Maternal Grandmother        not sure what type of cancer  . Cancer Maternal Grandfather        not sure what type of cancer  . Cancer Paternal Grandmother        not sure what type of cancer  . Cancer Paternal Grandfather        not sure what type of cancer    Social History Social History   Tobacco Use  . Smoking status:  Current Every Day Smoker    Packs/day: 0.50    Years: 12.00    Pack years: 6.00    Types: Cigarettes  . Smokeless tobacco: Never Used  Substance Use Topics  . Alcohol use: No    Alcohol/week: 0.0 oz    Frequency: Never  . Drug use: Yes    Types: Marijuana    Comment: "not often"    Review of Systems  Constitutional: No fever. Eyes: No visual changes. ENT: No neck pain. Cardiovascular: Denies chest pain. Respiratory: Denies shortness of breath. Gastrointestinal: No vomiting.  Genitourinary: Negative for flank pain.  Musculoskeletal: Negative for back pain. Skin: Negative for rash. Neurological: Negative for headache.   ____________________________________________   PHYSICAL EXAM:  VITAL SIGNS: ED Triage Vitals  Enc Vitals Group     BP 07/21/17 1140 (!) 152/94     Pulse Rate 07/21/17 1140 71     Resp 07/21/17 1140 20     Temp 07/21/17 1140 98.7 F (37.1 C)     Temp Source 07/21/17 1140 Oral     SpO2 07/21/17 1140 100 %     Weight 07/21/17 1141 180 lb (81.6 kg)     Height 07/21/17 1141 5\' 5"  (1.651 m)     Head Circumference --      Peak Flow --      Pain Score 07/21/17 1141 0     Pain Loc --      Pain Edu? --      Excl. in [place]? --     Constitutional: Alert and oriented. Well appearing and in no acute distress. Eyes: Conjunctivae are normal.  Head: Atraumatic. Nose: No congestion/rhinnorhea. Mouth/Throat: Mucous membranes are moist.   Neck: Normal range of motion.  Cardiovascular:  Good peripheral circulation. Respiratory: Normal respiratory effort. Gastrointestinal: No distention.  Musculoskeletal: No significant extremity edema.  Extremities warm and well perfused.  Neurologic:  Normal speech and language. No gross focal neurologic deficits are appreciated.  Skin:  Skin is warm and dry. No rash noted. Psychiatric: Mood and affect are normal. Speech and behavior are normal.  ____________________________________________   LABS (all labs ordered are  listed, but only abnormal results are displayed)  Labs Reviewed  BASIC METABOLIC PANEL - Abnormal; Notable for the following components:      Result Value   Calcium 8.7 (*)    All other components within normal limits  CBC - Abnormal; Notable for the following components:   RDW 15.0 (*)    All other components within normal limits  URINALYSIS, COMPLETE (UACMP) WITH MICROSCOPIC - Abnormal; Notable for the following components:   Color, Urine YELLOW (*)    APPearance CLEAR (*)    All other components within normal limits  GLUCOSE, CAPILLARY - Abnormal;  Notable for the following components:   Glucose-Capillary 69 (*)    All other components within normal limits  CBG MONITORING, ED   ____________________________________________  EKG  ED ECG REPORT I, Arta Silence, the attending physician, personally viewed and interpreted this ECG.  Date: 07/21/2017 EKG Time: 1136 Rate: 77 Rhythm: normal sinus rhythm QRS Axis: normal Intervals: normal ST/T Wave abnormalities: normal Narrative Interpretation: no evidence of acute ischemia  ____________________________________________  RADIOLOGY    ____________________________________________   PROCEDURES  Procedure(s) performed: No  Procedures  Critical Care performed: No ____________________________________________   INITIAL IMPRESSION / ASSESSMENT AND PLAN / ED COURSE  Pertinent labs & imaging results that were available during my care of the patient were reviewed by me and considered in my medical decision making (see chart for details).  42 year old female with a history of hypertension and other PMH as noted above presents with an episode of lightheadedness while at work today, which is now resolved.  Patient also reports mild swelling to her extremities which she states that she gets when her blood pressure is elevated.  She has been off of her lisinopril for a few days due to problem with getting the refill, but she  is scheduled to get the medication today and declined a new prescription.  On exam, the patient is well-appearing.  She is slightly hypertensive, but her other vital signs are normal.  The remainder of the exam is unremarkable and there is no clinically significant edema.  EKG is unremarkable.  Overall presentation is most consistent with vasovagal near syncope.  Lab work-up is unremarkable.  Given that the symptoms have resolved, the patient appears well, and there is no clinical or lab evidence of end organ dysfunction, she is safe for discharge home.  The patient feels well and would like to go home.  Return precautions given, and she expresses understanding.   ____________________________________________   FINAL CLINICAL IMPRESSION(S) / ED DIAGNOSES  Final diagnoses:  Lightheadedness  Hypertension, unspecified type      NEW MEDICATIONS STARTED DURING THIS VISIT:  Discharge Medication List as of 07/21/2017  2:19 PM       Note:  This document was prepared using Dragon voice recognition software and may include unintentional dictation errors.    Arta Silence, MD 07/21/17 [phone]

## 2017-07-21 NOTE — ED Notes (Signed)
Dr. Cherylann Banas informed patients BPs 157/105. Dr. Cherylann Banas OK with pt being D/C. Pt in NAD at this time and voices no concerns. Pt stable and ambulatory on D/C.

## 2017-07-27 ENCOUNTER — Ambulatory Visit: Payer: Medicaid Other

## 2017-08-24 ENCOUNTER — Inpatient Hospital Stay: Payer: Medicaid Other | Attending: Obstetrics and Gynecology

## 2017-09-06 ENCOUNTER — Telehealth: Payer: Self-pay | Admitting: *Deleted

## 2017-09-06 NOTE — Telephone Encounter (Signed)
Patient called to verify her appt, informed her of her appt with dr. Theora Gianotti on 8-21 at 1045. Voice agreement and understanding.

## 2017-09-14 ENCOUNTER — Encounter: Payer: Self-pay | Admitting: Obstetrics and Gynecology

## 2017-09-14 ENCOUNTER — Telehealth: Payer: Self-pay

## 2017-09-14 ENCOUNTER — Other Ambulatory Visit: Payer: Self-pay

## 2017-09-14 ENCOUNTER — Inpatient Hospital Stay: Payer: Medicaid Other | Attending: Obstetrics and Gynecology | Admitting: Obstetrics and Gynecology

## 2017-09-14 VITALS — BP 151/111 | HR 66 | Temp 97.3°F | Resp 18 | Ht 65.0 in | Wt 193.3 lb

## 2017-09-14 DIAGNOSIS — Z90722 Acquired absence of ovaries, bilateral: Secondary | ICD-10-CM | POA: Diagnosis not present

## 2017-09-14 DIAGNOSIS — Z90710 Acquired absence of both cervix and uterus: Secondary | ICD-10-CM

## 2017-09-14 DIAGNOSIS — F17210 Nicotine dependence, cigarettes, uncomplicated: Secondary | ICD-10-CM | POA: Diagnosis not present

## 2017-09-14 DIAGNOSIS — C539 Malignant neoplasm of cervix uteri, unspecified: Secondary | ICD-10-CM

## 2017-09-14 DIAGNOSIS — R350 Frequency of micturition: Secondary | ICD-10-CM

## 2017-09-14 LAB — URINALYSIS, COMPLETE (UACMP) WITH MICROSCOPIC
BACTERIA UA: NONE SEEN
BILIRUBIN URINE: NEGATIVE
GLUCOSE, UA: NEGATIVE mg/dL
HGB URINE DIPSTICK: NEGATIVE
Ketones, ur: NEGATIVE mg/dL
LEUKOCYTES UA: NEGATIVE
Nitrite: NEGATIVE
PH: 7 (ref 5.0–8.0)
Protein, ur: NEGATIVE mg/dL
SPECIFIC GRAVITY, URINE: 1.016 (ref 1.005–1.030)
WBC, UA: NONE SEEN WBC/hpf (ref 0–5)

## 2017-09-14 NOTE — Progress Notes (Addendum)
Gynecologic Oncology Interval Visit   Referring Provider: Dr Ouida Sills  Chief Concern: Stage IB1 cervical cancer, surveillance visit  Subjective:  Cassidy Mathis is a 42 y.o. G80P4 female with  Stage IB1 squamous cell cervical carcinoma, well-differentiated s/p radical hysterectomy, bilateral salpingectomy, with bilateral pelvic sentinel lymph node mapping, left pelvic sentinel node biopsies x 2, right pelvic lymph node dissection on 04/14/2017.  She was least seen 05/2017 in clinic and noted to have resolution of vaginal cuff dehiscence. She is doing well other than abdominal incisional pain that she describes as pulling that occurs with cough and sneeze. She does not need analgesics. She has been having difficulty seeing her PCP. She is on one blood pressure medication. She also complaints of urinary frequency since her surgery. Prior urine cultures shoed multiple species present. She is sexually active. She has mild pain with intercourse.   She is a smoker. She only took one of her blood pressure pills today and has to refill her prescription for the other pill. She does complain of headache.    Gynecologic Oncology History:  Cassidy Mathis is a pleasant G4P3 female with  Stage IB1 squamous cell cervical carcinoma, well-differentiated s/p radical hysterectomy, bilateral salpingectomy, with bilateral pelvic sentinel lymph node mapping, left pelvic sentinel node biopsies x 2, right pelvic lymph node dissection on 04/14/2017. See prior notes for complete details.   - 12/18 PAP ASCUS, + HR HPV. - 03/03/17 Colposcopy with Dr Melven Sartorius  and Bxs from ectocervix at 87, 13 and ECC well diff squamous cell cancer.    - 03/23/17- exam under anesthesia, cold knife cone, and endocervical curettage with Dr. Theora Gianotti at [facility]. Pathology:  A. CERVIX; COLD KNIFE CONE EXCISION: - SQUAMOUS CELL CARCINOMA, KERATINIZING TYPE, DEPTH OF INVASION 9 MM. - INVASIVE CARCINOMA BROADLY INVOLVES THE ECTOCERVICAL  MARGIN.  B. ENDOCERVIX; CURETTAGE: - SQUAMOUS CELL CARCINOMA, ONE 0.5 MM DETACHED FRAGMENT. - SCANT BENIGN ENDOCERVICAL AND ENDOMETRIAL TISSUE.  03/31/17- Post-operative cervical infection treated with antibiotics  04/12/2017 Preop CT scan  IMPRESSION: No evidence for metastatic disease within the abdomen or pelvis.  04/14/2017- Underwent radical hysterectomy, bilateral salpingectomy, with bilateral pelvic sentinel lymph node mapping, left pelvic sentinel node biopsies x 2, right pelvic lymph node dissection, cystoscopy with Dr. Theora Gianotti at [facility] for FIGO stage 1B1 disease.   Pathology:  Invasive squamous cell carcinoma, well-differentiated. Tumor measures 4.5 mm in depth and up to 16 mm in lateral extent.  Tumor involves the inner third of the cervical stroma (4.5 mm of 20 mm thickness).  No lymphovascular invasion identified.  The margins are uninvolved. Left SLN 0/2;  Right nodes 0/6; Left parametrium: 1 lymph node, negative for tumor. Right parametrium: 1 lymph node, negative for tumor  05/02/2017- Case presented at [facility] Multidisciplinary Tumor Board. Recommendation was observation as she does not meet [place] Criteria. Continue pap screening per ASCCP Guidelines.   Post operatively there were initially concerns for infection due to fever, chills, and night sweats. Exam revealed vaginal cuff dehiscence with granulation tissue above defect. She was started on metronidazole 500mg  BID x 14 days. Otherwise she was managed with conservative therapy and provided with vaginal precautions.   She has been NED since.  Problem List: Patient Active Problem List   Diagnosis Date Noted  . Cervical cancer, FIGO stage IB1 ([place]) 05/17/2017  . 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 Acute pancreatitis 12/22/2016  . Common bile duct stone   . Pancreatitis 12/16/2016  . Calculus of gallbladder with chronic  cholecystitis without obstruction   . Incarcerated epigastric hernia     Past Medical History: Past  Medical History:  Diagnosis Date  . Allergy    seasonal  . Anemia   . Cancer ([place])   . Depression   . GERD (gastroesophageal reflux disease)    RARE-NO MEDS  . Headache    MIGRAINES  . Hypertension    PT STATES SHE IS SUPPOSED TO BE TAKING LISINOPRIL-HCTZ BUT HAS BEEN OUT "FOR A WHILE" NEEDS TO GET ANOTHER PRESCRIPTION FROM HER PCP  . Pancreatitis   . Pilonidal cyst    TOOK ANTIBIOTIC THIS MONTH AND IT IS RESOLVED    Past Surgical History: Past Surgical History:  Procedure Laterality Date  . ABDOMINAL HYSTERECTOMY    . CERVICAL CONIZATION W/BX N/A 03/23/2017   Procedure: CONIZATION CERVIX WITH BIOPSY;  Surgeon: Gillis Ends, MD;  Location: [facility] ORS;  Service: Gynecology;  Laterality: N/A;  . CHOLECYSTECTOMY  10/21/2015   Procedure: LAPAROSCOPIC CHOLECYSTECTOMY WITH INTRAOPERATIVE CHOLANGIOGRAM;  Surgeon: Jules Husbands, MD;  Location: [facility] ORS;  Service: General;;  . CHOLECYSTECTOMY Bilateral   . CYST EXCISION     tongue AND WRIST  . EPIGASTRIC HERNIA REPAIR N/A 04/18/2015   Procedure: HERNIA REPAIR EPIGASTRIC ADULT;  Surgeon: Jules Husbands, MD;  Location: [facility] ORS;  Service: General;  Laterality: N/A;  . ERCP N/A 12/21/2016   Procedure: ENDOSCOPIC RETROGRADE CHOLANGIOPANCREATOGRAPHY (ERCP);  Surgeon: [name], MD;  Location: [facility] ENDOSCOPY;  Service: Endoscopy;  Laterality: N/A;  . HERNIA REPAIR    . RADICAL HYSTERECTOMY  04/14/2017  . SALPINGECTOMY Bilateral 04/14/2017  . SENTINEL LYMPH NODE BIOPSY    . TUBAL LIGATION      Family History: Family History  Problem Relation Age of Onset  . Hypertension Mother   . Gallstones Mother   . Hypertension Father   . Gout Father   . Cancer Paternal Uncle        lung  . Cancer Maternal Grandmother        not sure what type of cancer  . Cancer Maternal Grandfather        not sure what type of cancer  . Cancer Paternal Grandmother        not sure what type of cancer  . Cancer Paternal Grandfather        not sure  what type of cancer    Social History: Social History   Socioeconomic History  . Marital status: Single    Spouse name: Not on file  . Number of children: Not on file  . Years of education: Not on file  . Highest education level: Not on file  Occupational History  . Not on file  Social Needs  . Financial resource strain: Not on file  . Food insecurity:    Worry: Not on file    Inability: Not on file  . Transportation needs:    Medical: Not on file    Non-medical: Not on file  Tobacco Use  . Smoking status: Current Every Day Smoker    Packs/day: 0.50    Years: 12.00    Pack years: 6.00    Types: Cigarettes  . Smokeless tobacco: Never Used  Substance and Sexual Activity  . Alcohol use: No    Alcohol/week: 0.0 standard drinks    Frequency: Never  . Drug use: Yes    Types: Marijuana    Comment: "not often"  . Sexual activity: Yes  Lifestyle  . Physical activity:  Days per week: Not on file    Minutes per session: Not on file  . Stress: Not on file  Relationships  . Social connections:    Talks on phone: Not on file    Gets together: Not on file    Attends religious service: Not on file    Active member of club or organization: Not on file    Attends meetings of clubs or organizations: Not on file    Relationship status: Not on file  . Intimate partner violence:    Fear of current or ex partner: Not on file    Emotionally abused: Not on file    Physically abused: Not on file    Forced sexual activity: Not on file  Other Topics Concern  . Not on file  Social History Narrative  . Not on file    Allergies: No Known Allergies  Current Medications: Current Outpatient Medications  Medication Sig Dispense Refill  . amLODipine (NORVASC) 5 MG tablet Take 5 mg by mouth daily after lunch.     . lisinopril (PRINIVIL,ZESTRIL) 20 MG tablet Take 1 tablet (20 mg total) by mouth daily. (Patient taking differently: Take 20 mg by mouth daily after lunch. ) 30 tablet 1   . clonazePAM (KLONOPIN) 0.5 MG tablet Take 0.5 mg by mouth 2 (two) times daily as needed for anxiety.     No current facility-administered medications for this visit.    Review of Systems General: No complaints Skin: No complaints Eyes: No complaints HEENT: No complaints Breasts: No complaints Pulmonary: No complaints Cardiac: No complaints GI: Abdominal bloating, pain GU/Sexual: urinary frequency GYN: No complaints MSK: No complaints Hematology: No complaints Neuro/Psych: No complaints  Objective:  Physical Examination:  BP (!) 151/111   Pulse 66   Temp (!) 97.3 F (36.3 C) (Oral)   Resp 18   Ht 5\' 5"  (1.651 m)   Wt 193 lb 4.8 oz (87.7 kg)   LMP 03/18/2017 (Exact Date)   BMI 32.17 kg/m    ECOG Performance Status: 1 - Symptomatic but completely ambulatory  General appearance: alert, cooperative and appears stated age HEENT:  Sclerae anicteric.  PERRL and thyroid without masses. Neck is supple.  NODES:  No cervical, supraclavicular, or axillary lymphadenopathy palpated.  Cardiovascular: Regular rate and rhythm Respiratory: Clear to auscultation bilaterally.  No wheezes or rhonchi. Abdomen: Soft, nontender. No hernias and surgical incisions are well healed. Incision all well healed. No hernias, no masses, no ascites.  Extremities:No edema Skin exam - Well healed incisions.  Neurological exam reveals alert, oriented, normal speech, no focal findings or movement disorder noted  Pelvic: exam chaperoned by NP Vulva: normal appearing vulva with no masses, tenderness or lesions; Vagina: normal on BME thickness at the left fornix but no nodularity or masses; Adnexa: negative for masses or nodularity; Uterus and Cervix: surgically absent; Rectal: deferred     Assessment:  Brylei Pedley Higginbotham is a 42 y.o. female diagnosed with stage IB1 squamous cell carcinoma of the cervix s/p radical hysterectomy, bilateral salpingectomy, with bilateral pelvic sentinel lymph node mapping,  left pelvic sentinel node biopsies x 2, right pelvic lymph node dissection on  04/14/2017. Low risk pathology factors, so no adjuvant therapy recommended.   Vaginal cuff defect completely healed.   Urinary frequency, assess for UTI.   Hypertension, poorly controlled and symptomatic with headaches.   Medical co-morbidities complicating care: smokes half pack day, Body mass index is 32.17 kg/m.   Plan:   Problem List Items Addressed  This Visit      Other   Cervical cancer, FIGO stage IB1 ([place]) - Primary     We reviewed her prognosis. According to UpToDate risk of recurrence for stage IB is 10%. I have recommended continued close follow up with exams, including pelvic exams every 3-6 months for 2 years, then every 6-12 months for 3-5 years and then annually thereafter. Cervical/vaginal cytology may be considered annually, but is thought to have limited value for detecting recurrent cervical cancer. Imaging and laboratory assessment is based on clinical indication. Patient education for obesity, lifestyle, exercise, nutrition, smoking cessation, sexual health, vaginal dilator use, vaginal lubricants/moisturizers. We also discussed bladder function issues after surgery.   We discussed smoking cessation and our team will connect her with Melinda Crutch about Quit Smart smoking cessation program.    She will see Dr. Ouida Sills in 3 months (we provided the contact information but she will need to make the appointment) and return for repeat exam in 6 months in our clinic.   She was provided referral to primary care for management of HTN. We also recommended screening for diabetes. GIven her DBP of 111 and symptoms of headaches we will have her see the [facility] today. She declined due to transportation issues. She will refill her prescription and take her second BP medication today. We requested she take her BPs and contact us with results. Precautions given and she is to contact our symptom  management clinic or ER if she has concerning symptoms.   Urinalysis with reflex urine culture with speciation requested to assess for UTI.    Beckey Rutter, DNP, AGNP-C [place] [facility] [phone] (work cell) [phone] (office)  I personally had a face to face interaction and evaluated the patient jointly with the NP, Ms. Beckey Rutter.  I have reviewed her history and available records and have performed the key portions of the physical exam includinglymph node survey, abdominal exam, pelvic exam with my findings confirming those documented above by the APP.  I have discussed the case with the APP and the patient.  I agree with the above documentation, assessment and plan which was fully formulated by me.  Counseling was completed by me.   I personally saw the patient and performed a substantive portion of this encounter in conjunction with the listed APP as documented above.  A total of at least 25 minutes were spent with the patient/family today; >50% was spent in education, counseling and coordination of care for cervical cancer.  Angeles Gaetana Michaelis, MD  CC:  Dr Ouida Sills

## 2017-09-14 NOTE — Progress Notes (Signed)
Pt states that she did not take b/p pills today- she forgot. Her b/p is up and down. She does not like her md-charles drew-she states that it is hard to get an appt. There. She has a pulling pain lower center of abdomen almost at pelvic center when she bends a certain way, sneezes or coughs. it wears off eventually. She has HA 4 out of 7 days and she use to see a person for this. She take advil when it hurts and it sometimes helps and sometimes she has to take more med. She has HA today-7 rating and has not taken anything for it. she also complains of can't hold her urine -has to urinate all the time-no smell, and no pain when urinating. She also says she feels the urge to urinate when having sexual intercourse and has to get up and urinate and it is always a small amount

## 2017-09-14 NOTE — Patient Instructions (Addendum)
We have contacted set you up with a new primary care practice. The appointment should populate below.   In the interim, these are some options for local free and sliding scale clinics which provide affordable care, and often have onsite pharmacies, such as: [facility] ([address], [place], [place] [address]- [phone]), [facility] [place] ([address], [place], [place] [address]- [phone]), [facility] ([address] [place], [place] [address]- [phone]), [place] ([place] [phone]), and [place] and [phone]).   You will need to schedule an appointment with Dr. Ouida Sills in 3 months (12/15/17) for follow-up visit. Clinic number is [phone]. We will see you back at [facility] in 6 months.     Surveillance: Recommended continued close follow up with exams, including pelvic exams every 3-6 months for 2 years, then every 6-12 months for 3-5 years and then annually thereafter. Cervical/vaginal cytology may be considered annually, but is thought to have limited value for detecting recurrent cervical cancer. Imaging and laboratory assessment is based on clinical indication.   Patient education for obesity, lifestyle, exercise, nutrition, smoking cessation, sexual health, vaginal dilator use, vaginal lubricants/moisturizers as well as bladder function issues after surgery.

## 2017-09-14 NOTE — Progress Notes (Addendum)
Today at the clinic the patient presented with a blood pressure of 151/111. The patient does report of only taking her Linisopril this AM, but forgot the other one.Dr Theora Gianotti offer the patient to go to the ED, but the patient has agreed to go to the pick medication up soon as she leave the office. The patient does report of getting a head ache while in the clinic, but no blurry vision noted today. The patient had a repeat blood pressure after being in the clinic for 1 hour ( repeat blood pressure 167/109.) The patient states she will go pick up her medication from [facility] and buy a blood pressure cuff to check her blood pressure. I have informed the patient to go get her blood pressure medication and recheck her blood pressure in 1 hour after taking medication. The patient was also inform to call [employer] phone( number was given to the patient) to inform our office of her recheck blood pressure. Primary care provider was schedule for the patient as a new patient on 09/21/17 at 9:00AM. The patient was agreeable to go see another provider for primary care. The patient was agreeable and understanding.

## 2017-09-14 NOTE — Telephone Encounter (Addendum)
Called Ms. Caulder. Notified of negative U/A. She has not rechecked her BP. She states he had to get medication called in by her physician. States she is going to do this. She was again educated that we could see her back in clinic for BP check. If she begins having any symptoms as educated during previous clinic visit she needs to report to the ED. She verbalized understanding. Oncology Nurse Navigator Documentation  Navigator Location: [facility] (09/14/17 1500)   )Navigator Encounter Type: Telephone (09/14/17 1500) Telephone: Outgoing Call;Patient Update (09/14/17 1500)                                                  Time Spent with Patient: 15 (09/14/17 1500)

## 2017-09-15 ENCOUNTER — Other Ambulatory Visit: Payer: Self-pay

## 2017-09-15 ENCOUNTER — Encounter: Payer: Self-pay | Admitting: Emergency Medicine

## 2017-09-15 ENCOUNTER — Telehealth: Payer: Self-pay

## 2017-09-15 ENCOUNTER — Emergency Department
Admission: EM | Admit: 2017-09-15 | Discharge: 2017-09-15 | Disposition: A | Discharge status: Home / Self Care | Payer: Medicaid Other | Attending: Emergency Medicine | Admitting: Emergency Medicine

## 2017-09-15 DIAGNOSIS — F329 Major depressive disorder, single episode, unspecified: Secondary | ICD-10-CM | POA: Diagnosis not present

## 2017-09-15 DIAGNOSIS — Z8541 Personal history of malignant neoplasm of cervix uteri: Secondary | ICD-10-CM | POA: Insufficient documentation

## 2017-09-15 DIAGNOSIS — Z9049 Acquired absence of other specified parts of digestive tract: Secondary | ICD-10-CM | POA: Diagnosis not present

## 2017-09-15 DIAGNOSIS — F17210 Nicotine dependence, cigarettes, uncomplicated: Secondary | ICD-10-CM | POA: Diagnosis not present

## 2017-09-15 DIAGNOSIS — I10 Essential (primary) hypertension: Secondary | ICD-10-CM | POA: Diagnosis not present

## 2017-09-15 LAB — CBC
HCT: 37 % (ref 35.0–47.0)
HEMOGLOBIN: 12.6 g/dL (ref 12.0–16.0)
MCH: 29.6 pg (ref 26.0–34.0)
MCHC: 34 g/dL (ref 32.0–36.0)
MCV: 87.1 fL (ref 80.0–100.0)
PLATELETS: 415 10*3/uL (ref 150–440)
RBC: 4.25 MIL/uL (ref 3.80–5.20)
RDW: 13.5 % (ref 11.5–14.5)
WBC: 6.1 10*3/uL (ref 3.6–11.0)

## 2017-09-15 LAB — BASIC METABOLIC PANEL
ANION GAP: 3 — AB (ref 5–15)
BUN: 15 mg/dL (ref 6–20)
CALCIUM: 8.9 mg/dL (ref 8.9–10.3)
CO2: 28 mmol/L (ref 22–32)
CREATININE: 0.94 mg/dL (ref 0.44–1.00)
Chloride: 108 mmol/L (ref 98–111)
GFR calc Af Amer: >60 mL/min (ref >60)
GFR calc non Af Amer: >60 mL/min (ref >60)
GLUCOSE: 86 mg/dL (ref 70–99)
POTASSIUM: 3.8 mmol/L (ref 3.5–5.1)
Sodium: 139 mmol/L (ref 135–145)

## 2017-09-15 MED ORDER — AMLODIPINE BESYLATE 5 MG PO TABS: 5.0000 mg | ORAL_TABLET | Freq: Every day | ORAL | 1 refills | Status: DC

## 2017-09-15 MED ORDER — LISINOPRIL 20 MG PO TABS: 20.0000 mg | ORAL_TABLET | Freq: Every day | ORAL | 1 refills | Status: DC

## 2017-09-15 NOTE — ED Triage Notes (Signed)
Says she has been out of bp med for 2-3 weeks and has been waiting for her doctor to call it in.

## 2017-09-15 NOTE — ED Triage Notes (Signed)
Woke up with  Headache and feeling dizziness.  Says her bp was elevated at cancer center yesterday.

## 2017-09-15 NOTE — Telephone Encounter (Signed)
Spoke with the patient to see if she had recheck her blood pressure the patient states she didn't check her blood pressure and c/o a severe headache, no blurry vision, no chest pain. The patient was instructed to go the the ED. The patient was understanding and agreed to go to the ED at 2:00 pm today. I will return a call to the patient later to see if she went to the ED for her blood pressure.

## 2017-09-15 NOTE — ED Provider Notes (Addendum)
[facility] Emergency Department Provider Note   ____________________________________________    I have reviewed the triage vital signs and the nursing notes.   HISTORY  Chief Complaint Hypertension     HPI Cassidy Mathis is a 42 y.o. female who presents with high blood pressure.  Patient reports that her blood pressure has been high for several weeks because she ran out of her amlodipine.  She has been taking her lisinopril.  She had an appointment at the cancer center yesterday for follow-up and they noted her blood pressure was high and that she needed to see PCP.  They made her quite anxious and so she presented to the emergency department today.  Overall she feels quite well and has no complaints.  She does have intermittent headaches over the last couple of months of unknown origin.  No neurological deficits.   Past Medical History:  Diagnosis Date  . Allergy    seasonal  . Anemia   . Cancer ([place])   . Depression   . GERD (gastroesophageal reflux disease)    RARE-NO MEDS  . Headache    MIGRAINES  . Hypertension    PT STATES SHE IS SUPPOSED TO BE TAKING LISINOPRIL-HCTZ BUT HAS BEEN OUT "FOR A WHILE" NEEDS TO GET ANOTHER PRESCRIPTION FROM HER PCP  . Pancreatitis   . Pilonidal cyst    TOOK ANTIBIOTIC THIS MONTH AND IT IS RESOLVED    Patient Active Problem List   Diagnosis Date Noted  . Cervical cancer, FIGO stage IB1 ([place]) 05/17/2017  . 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 Acute pancreatitis 12/22/2016  . Common bile duct stone   . Pancreatitis 12/16/2016  . 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 Incarcerated epigastric hernia     Past Surgical History:  Procedure Laterality Date  . ABDOMINAL HYSTERECTOMY    . CERVICAL CONIZATION W/BX N/A 03/23/2017   Procedure: CONIZATION CERVIX WITH BIOPSY;  Surgeon: Gillis Ends, MD;  Location: [facility] ORS;  Service: Gynecology;  Laterality: N/A;   . CHOLECYSTECTOMY  10/21/2015   Procedure: LAPAROSCOPIC CHOLECYSTECTOMY WITH INTRAOPERATIVE CHOLANGIOGRAM;  Surgeon: Jules Husbands, MD;  Location: [facility] ORS;  Service: General;;  . CHOLECYSTECTOMY Bilateral   . CYST EXCISION     tongue AND WRIST  . EPIGASTRIC HERNIA REPAIR N/A 04/18/2015   Procedure: HERNIA REPAIR EPIGASTRIC ADULT;  Surgeon: Jules Husbands, MD;  Location: [facility] ORS;  Service: General;  Laterality: N/A;  . ERCP N/A 12/21/2016   Procedure: ENDOSCOPIC RETROGRADE CHOLANGIOPANCREATOGRAPHY (ERCP);  Surgeon: [name], MD;  Location: [facility] ENDOSCOPY;  Service: Endoscopy;  Laterality: N/A;  . HERNIA REPAIR    . RADICAL HYSTERECTOMY  04/14/2017  . SALPINGECTOMY Bilateral 04/14/2017  . SENTINEL LYMPH NODE BIOPSY    . TUBAL LIGATION      Prior to Admission medications   Medication Sig Start Date End Date Taking? Authorizing Provider  amLODipine (NORVASC) 5 MG tablet Take 1 tablet (5 mg total) by mouth daily after lunch. 09/15/17   Lavonia Drafts, MD  clonazePAM (KLONOPIN) 0.5 MG tablet Take 0.5 mg by mouth 2 (two) times daily as needed for anxiety.    [provider]  lisinopril (PRINIVIL,ZESTRIL) 20 MG tablet Take 1 tablet (20 mg total) by mouth daily. 09/15/17   Lavonia Drafts, MD     Allergies Patient has no known allergies.  Family History  Problem Relation Age of Onset  . Hypertension Mother   . Gallstones Mother   .  Hypertension Father   . Gout Father   . Cancer Paternal Uncle        lung  . Cancer Maternal Grandmother        not sure what type of cancer  . Cancer Maternal Grandfather        not sure what type of cancer  . Cancer Paternal Grandmother        not sure what type of cancer  . Cancer Paternal Grandfather        not sure what type of cancer    Social History Social History   Tobacco Use  . Smoking status: Current Every Day Smoker    Packs/day: 0.50    Years: 12.00    Pack years: 6.00    Types: Cigarettes  . Smokeless tobacco: Never  Used  Substance Use Topics  . Alcohol use: No    Alcohol/week: 0.0 standard drinks    Frequency: Never  . Drug use: Yes    Types: Marijuana    Comment: "not often"    Review of Systems  Constitutional: No fever/chills  ENT: No neck pain   Gastrointestinal:   No nausea, no vomiting.    Musculoskeletal: Negative for back pain.  Neurological: As above    ____________________________________________   PHYSICAL EXAM:  VITAL SIGNS: ED Triage Vitals  Enc Vitals Group     BP 09/15/17 1058 (!) 157/101     Pulse Rate 09/15/17 1058 68     Resp 09/15/17 1058 14     Temp 09/15/17 1058 98.2 F (36.8 C)     Temp Source 09/15/17 1058 Oral     SpO2 09/15/17 1058 100 %     Weight 09/15/17 1059 87.6 kg (193 lb 2 oz)     Height 09/15/17 1059 1.651 m (5\' 5" )     Head Circumference --      Peak Flow --      Pain Score 09/15/17 1059 10     Pain Loc --      Pain Edu? --      Excl. in [place]? --      Constitutional: Alert and oriented. No acute distress. Pleasant and interactive Eyes: Conjunctivae are normal.  PERRLA Head: Atraumatic. Nose: No congestion/rhinnorhea. Mouth/Throat: Mucous membranes are moist.   Cardiovascular: Normal rate, regular rhythm.  Respiratory: Normal respiratory effort.  No retractions.  Musculoskeletal: No lower extremity tenderness nor edema.   Neurologic:  Normal speech and language. No gross focal neurologic deficits are appreciated.   Skin:  Skin is warm, dry and intact. No rash noted.   ____________________________________________   LABS (all labs ordered are listed, but only abnormal results are displayed)  Labs Reviewed  BASIC METABOLIC PANEL - Abnormal; Notable for the following components:      Result Value   Anion gap 3 (*)    All other components within normal limits  CBC  URINALYSIS, COMPLETE (UACMP) WITH MICROSCOPIC   ____________________________________________  EKG  ED ECG REPORT I, Lavonia Drafts, the attending physician,  personally viewed and interpreted this ECG.  Date: 09/30/2017  Rhythm: normal sinus rhythm QRS Axis: normal Intervals: normal ST/T Wave abnormalities: normal Narrative Interpretation: no evidence of acute ischemia  ____________________________________________  RADIOLOGY   ____________________________________________   PROCEDURES  Procedure(s) performed: No  Procedures   Critical Care performed: No ____________________________________________   INITIAL IMPRESSION / ASSESSMENT AND PLAN / ED COURSE  Pertinent labs & imaging results that were available during my care of the patient were reviewed by  me and considered in my medical decision making (see chart for details).  Patient is well-appearing in no acute distress.  Blood pressure is elevated at 157/101.  He is essentially here for a medication refill we will refill her amlodipine and lisinopril.  She has follow-up with her PCP arranged.   ____________________________________________   FINAL CLINICAL IMPRESSION(S) / ED DIAGNOSES  Final diagnoses:  Essential hypertension      NEW MEDICATIONS STARTED DURING THIS VISIT:  Discharge Medication List as of 09/15/2017 12:34 PM       Note:  This document was prepared using Dragon voice recognition software and may include unintentional dictation errors.    Lavonia Drafts, MD 09/15/17 1527    Lavonia Drafts, MD 09/30/17 [phone]

## 2017-09-21 ENCOUNTER — Ambulatory Visit: Payer: Self-pay | Admitting: Family Medicine

## 2017-11-16 ENCOUNTER — Encounter (INDEPENDENT_AMBULATORY_CARE_PROVIDER_SITE_OTHER): Payer: Self-pay

## 2017-11-16 ENCOUNTER — Encounter: Payer: Self-pay | Admitting: Family Medicine

## 2017-11-16 ENCOUNTER — Ambulatory Visit: Payer: Medicaid Other | Admitting: Family Medicine

## 2017-11-16 VITALS — BP 114/70 | HR 81 | Temp 98.3°F | Resp 14 | Ht 65.0 in | Wt 197.3 lb

## 2017-11-16 DIAGNOSIS — Z23 Encounter for immunization: Secondary | ICD-10-CM

## 2017-11-16 DIAGNOSIS — C539 Malignant neoplasm of cervix uteri, unspecified: Secondary | ICD-10-CM

## 2017-11-16 DIAGNOSIS — I10 Essential (primary) hypertension: Secondary | ICD-10-CM

## 2017-11-16 DIAGNOSIS — Z6832 Body mass index (BMI) 32.0-32.9, adult: Secondary | ICD-10-CM

## 2017-11-16 DIAGNOSIS — R0989 Other specified symptoms and signs involving the circulatory and respiratory systems: Secondary | ICD-10-CM

## 2017-11-16 DIAGNOSIS — Z13220 Encounter for screening for lipoid disorders: Secondary | ICD-10-CM

## 2017-11-16 DIAGNOSIS — Z720 Tobacco use: Secondary | ICD-10-CM

## 2017-11-16 DIAGNOSIS — R0689 Other abnormalities of breathing: Secondary | ICD-10-CM

## 2017-11-16 DIAGNOSIS — D62 Acute posthemorrhagic anemia: Secondary | ICD-10-CM

## 2017-11-16 DIAGNOSIS — J209 Acute bronchitis, unspecified: Secondary | ICD-10-CM | POA: Diagnosis not present

## 2017-11-16 DIAGNOSIS — E6609 Other obesity due to excess calories: Secondary | ICD-10-CM

## 2017-11-16 MED ORDER — BUDESONIDE-FORMOTEROL FUMARATE 80-4.5 MCG/ACT IN AERO: 2.0000 | INHALATION_SPRAY | Freq: Two times a day (BID) | RESPIRATORY_TRACT | 3 refills | Status: DC

## 2017-11-16 MED ORDER — AMLODIPINE BESYLATE 5 MG PO TABS: 5.0000 mg | ORAL_TABLET | Freq: Every day | ORAL | 1 refills | Status: DC

## 2017-11-16 MED ORDER — LISINOPRIL 20 MG PO TABS: 20.0000 mg | ORAL_TABLET | Freq: Every day | ORAL | 1 refills | Status: DC

## 2017-11-16 NOTE — Patient Instructions (Addendum)
Please come in for blood pressure check after your next oncology appointment at the cancer center.  Call our office before heading over to make sure someone is available to do your BP check. [phone]  DASH Eating Plan DASH stands for "Dietary Approaches to Stop Hypertension." The DASH eating plan is a healthy eating plan that has been shown to reduce high blood pressure (hypertension). It may also reduce your risk for type 2 diabetes, heart disease, and stroke. The DASH eating plan may also help with weight loss. What are tips for following this plan? General guidelines  Avoid eating more than 2,300 mg (milligrams) of salt (sodium) a day. If you have hypertension, you may need to reduce your sodium intake to 1,500 mg a day.  Limit alcohol intake to no more than 1 drink a day for nonpregnant women and 2 drinks a day for men. One drink equals 12 oz of beer, 5 oz of wine, or 1 oz of hard liquor.  Work with your health care provider to maintain a healthy body weight or to lose weight. Ask what an ideal weight is for you.  Get at least 30 minutes of exercise that causes your heart to beat faster (aerobic exercise) most days of the week. Activities may include walking, swimming, or biking.  Work with your health care provider or diet and nutrition specialist (dietitian) to adjust your eating plan to your individual calorie needs. Reading food labels  Check food labels for the amount of sodium per serving. Choose foods with less than 5 percent of the Daily Value of sodium. Generally, foods with less than 300 mg of sodium per serving fit into this eating plan.  To find whole grains, look for the word "whole" as the first word in the ingredient list. Shopping  Buy products labeled as "low-sodium" or "no salt added."  Buy fresh foods. Avoid canned foods and premade or frozen meals. Cooking  Avoid adding salt when cooking. Use salt-free seasonings or herbs instead of table salt or sea salt.  Check with your health care provider or pharmacist before using salt substitutes.  Do not fry foods. Cook foods using healthy methods such as baking, boiling, grilling, and broiling instead.  Cook with heart-healthy oils, such as olive, canola, soybean, or sunflower oil. Meal planning   Eat a balanced diet that includes: ? 5 or more servings of fruits and vegetables each day. At each meal, try to fill half of your plate with fruits and vegetables. ? Up to 6-8 servings of whole grains each day. ? Less than 6 oz of lean meat, poultry, or fish each day. A 3-oz serving of meat is about the same size as a deck of cards. One egg equals 1 oz. ? 2 servings of low-fat dairy each day. ? A serving of nuts, seeds, or beans 5 times each week. ? Heart-healthy fats. Healthy fats called Omega-3 fatty acids are found in foods such as flaxseeds and coldwater fish, like sardines, salmon, and mackerel.  Limit how much you eat of the following: ? Canned or prepackaged foods. ? Food that is high in trans fat, such as fried foods. ? Food that is high in saturated fat, such as fatty meat. ? Sweets, desserts, sugary drinks, and other foods with added sugar. ? Full-fat dairy products.  Do not salt foods before eating.  Try to eat at least 2 vegetarian meals each week.  Eat more home-cooked food and less restaurant, buffet, and fast food.  When eating  at a restaurant, ask that your food be prepared with less salt or no salt, if possible. What foods are recommended? The items listed may not be a complete list. Talk with your dietitian about what dietary choices are best for you. Grains Whole-grain or whole-wheat bread. Whole-grain or whole-wheat pasta. Brown rice. [place]. Bulgur. Whole-grain and low-sodium cereals. Pita bread. Low-fat, low-sodium crackers. Whole-wheat flour tortillas. Vegetables Fresh or frozen vegetables (raw, steamed, roasted, or grilled). Low-sodium or reduced-sodium tomato and  vegetable juice. Low-sodium or reduced-sodium tomato sauce and tomato paste. Low-sodium or reduced-sodium canned vegetables. Fruits All fresh, dried, or frozen fruit. Canned fruit in natural juice (without added sugar). Meat and other protein foods Skinless chicken or [place]. Ground chicken or [place]. Pork with fat trimmed off. Fish and seafood. Egg whites. Dried beans, peas, or lentils. Unsalted nuts, nut butters, and seeds. Unsalted canned beans. Lean cuts of beef with fat trimmed off. Low-sodium, lean deli meat. Dairy Low-fat (1%) or fat-free (skim) milk. Fat-free, low-fat, or reduced-fat cheeses. Nonfat, low-sodium ricotta or cottage cheese. Low-fat or nonfat yogurt. Low-fat, low-sodium cheese. Fats and oils Soft margarine without trans fats. Vegetable oil. Low-fat, reduced-fat, or light mayonnaise and salad dressings (reduced-sodium). Canola, safflower, olive, soybean, and sunflower oils. Avocado. Seasoning and other foods Herbs. Spices. Seasoning mixes without salt. Unsalted popcorn and pretzels. Fat-free sweets. What foods are not recommended? The items listed may not be a complete list. Talk with your dietitian about what dietary choices are best for you. Grains Baked goods made with fat, such as croissants, muffins, or some breads. Dry pasta or rice meal packs. Vegetables Creamed or fried vegetables. Vegetables in a cheese sauce. Regular canned vegetables (not low-sodium or reduced-sodium). Regular canned tomato sauce and paste (not low-sodium or reduced-sodium). Regular tomato and vegetable juice (not low-sodium or reduced-sodium). Angie Fava. Olives. Fruits Canned fruit in a light or heavy syrup. Fried fruit. Fruit in cream or butter sauce. Meat and other protein foods Fatty cuts of meat. Ribs. Fried meat. Berniece Salines. Sausage. Bologna and other processed lunch meats. Salami. Fatback. Hotdogs. Bratwurst. Salted nuts and seeds. Canned beans with added salt. Canned or smoked fish. Whole eggs or  egg yolks. Chicken or [place] with skin. Dairy Whole or 2% milk, cream, and half-and-half. Whole or full-fat cream cheese. Whole-fat or sweetened yogurt. Full-fat cheese. Nondairy creamers. Whipped toppings. Processed cheese and cheese spreads. Fats and oils Butter. Stick margarine. Lard. Shortening. Ghee. Bacon fat. Tropical oils, such as coconut, palm kernel, or palm oil. Seasoning and other foods Salted popcorn and pretzels. Onion salt, garlic salt, seasoned salt, table salt, and sea salt. Worcestershire sauce. Tartar sauce. Barbecue sauce. Teriyaki sauce. Soy sauce, including reduced-sodium. Steak sauce. Canned and packaged gravies. Fish sauce. Oyster sauce. Cocktail sauce. Horseradish that you find on the shelf. Ketchup. Mustard. Meat flavorings and tenderizers. Bouillon cubes. Hot sauce and Tabasco sauce. Premade or packaged marinades. Premade or packaged taco seasonings. Relishes. Regular salad dressings. Where to find more information:  National Heart, Lung, and [place]: https://wilson-eaton.com/  American Heart Association: www.heart.org Summary  The DASH eating plan is a healthy eating plan that has been shown to reduce high blood pressure (hypertension). It may also reduce your risk for type 2 diabetes, heart disease, and stroke.  With the DASH eating plan, you should limit salt (sodium) intake to 2,300 mg a day. If you have hypertension, you may need to reduce your sodium intake to 1,500 mg a day.  When on the DASH eating plan, aim to  eat more fresh fruits and vegetables, whole grains, lean proteins, low-fat dairy, and heart-healthy fats.  Work with your health care provider or diet and nutrition specialist (dietitian) to adjust your eating plan to your individual calorie needs. This information is not intended to replace advice given to you by your health care provider. Make sure you discuss any questions you have with your health care provider. Document Released: 12/31/2010  Document Revised: 01/05/2016 Document Reviewed: 01/05/2016 Elsevier Interactive Patient Education  [employer].

## 2017-11-16 NOTE — Progress Notes (Signed)
Name: Cassidy Mathis   MRN: [id number]    DOB: 12/11/1975   Date:11/16/2017       Progress Note  Subjective  Chief Complaint  Chief Complaint  Patient presents with  . Establish Care  . Hypertension    referral from [place], cervical cancer  . 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 Theora Gianotti at the [facility] cancer center was 09/14/2017.  Had radical hysterectomy in March 2019; no adjuvant therapy recommended per Dr. Gershon Crane notes.  At present she will be returning every 6-12 weeks for recheck.  HTN: she has had several elevated BP's while at the cancer center over the last several months and was referred here today after an ER visit for HTN on 09/15/17 - BP there was 157/101; reading on 09/14/17 was 151/111 at the cancer center.  Had not been out of her meds at that time. Today BP is at goal today.  When her BP is running high, she gets lightheaded, headaches, and sees spots.  Does not use salt in her food; does drink a lot of sodas (will have 2L soda in a day typically).  Obesity: Body mass index is 32.83 kg/m. Weight Management History: Diet: poor meal pattern and excessive sugar carbonated beverage intake Exercise: not active Co-Morbid Conditions: hypertension; 2 or more of these conditions combined with BMI >30 is considered morbid obesity; is this diagnosis appropriate and/or added to patient's problem list? No  Tobacco abuse counseling/Cough: x2 weeks, productive (yellow mucous); sore throat, shortness of breath, some chills, some loose stools.  She is a smoker - smokes 1/2ppd but has not been smoking since she's had this cough.  Denies nasal congestion, chest pain, fevers, N/V.  She was given an albuterol inhaler at [facility] when she went 1 week ago - was also given codeine cough medication and steroids (finished the steroid).  Patient Active Problem List    Diagnosis Date Noted  . Cervical cancer, FIGO stage IB1 ([place]) 05/17/2017  . Malignant neoplasm of exocervix ([place])   . Acute pancreatitis 12/22/2016  . Common bile duct stone   . Pancreatitis 12/16/2016  . 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 Incarcerated epigastric hernia     Past Surgical History:  Procedure Laterality Date  . ABDOMINAL HYSTERECTOMY    . CERVICAL CONIZATION W/BX N/A 03/23/2017   Procedure: CONIZATION CERVIX WITH BIOPSY;  Surgeon: Gillis Ends, MD;  Location: [facility] ORS;  Service: Gynecology;  Laterality: N/A;  . CHOLECYSTECTOMY  10/21/2015   Procedure: LAPAROSCOPIC CHOLECYSTECTOMY WITH INTRAOPERATIVE CHOLANGIOGRAM;  Surgeon: Jules Husbands, MD;  Location: [facility] ORS;  Service: General;;  . CHOLECYSTECTOMY Bilateral   . CYST EXCISION     tongue AND WRIST  . EPIGASTRIC HERNIA REPAIR N/A 04/18/2015   Procedure: HERNIA REPAIR EPIGASTRIC ADULT;  Surgeon: Jules Husbands, MD;  Location: [facility] ORS;  Service: General;  Laterality: N/A;  . ERCP N/A 12/21/2016   Procedure: ENDOSCOPIC RETROGRADE CHOLANGIOPANCREATOGRAPHY (ERCP);  Surgeon: [name], MD;  Location: [facility] ENDOSCOPY;  Service: Endoscopy;  Laterality: N/A;  . HERNIA REPAIR    . RADICAL HYSTERECTOMY  04/14/2017  . SALPINGECTOMY Bilateral 04/14/2017  . SENTINEL LYMPH NODE BIOPSY    . TUBAL LIGATION      Family History  Problem Relation Age of Onset  . Hypertension Mother   .  Gallstones Mother   . Hypertension Father   . Gout Father   . Cancer Paternal Uncle        lung  . Cancer Maternal Grandmother        not sure what type of cancer  . Cancer Maternal Grandfather        not sure what type of cancer  . Cancer Paternal Grandmother        not sure what type of cancer  . Cancer Paternal Grandfather        not sure what type of cancer    Social History   Socioeconomic History  . Marital status: Single    Spouse name: Not on file  . Number of children: 3  .  Years of education: Not on file  . Highest education level: Not on file  Occupational History  . Not on file  Social Needs  . Financial resource strain: Not hard at all  . Food insecurity:    Worry: Never true    Inability: Never true  . Transportation needs:    Medical: No    Non-medical: No  Tobacco Use  . Smoking status: Current Every Day Smoker    Packs/day: 0.50    Years: 12.00    Pack years: 6.00    Types: Cigarettes  . Smokeless tobacco: Never Used  Substance and Sexual Activity  . Alcohol use: No    Alcohol/week: 0.0 standard drinks    Frequency: Never  . Drug use: Yes    Types: Marijuana    Comment: "not often"  . Sexual activity: Yes    Partners: Male  Lifestyle  . Physical activity:    Days per week: 0 days    Minutes per session: 0 min  . Stress: Only a little  Relationships  . Social connections:    Talks on phone: More than three times a week    Gets together: More than three times a week    Attends religious service: Never    Active member of club or organization: No    Attends meetings of clubs or organizations: Not on file    Relationship status: Never married  . Intimate partner violence:    Fear of current or ex partner: No    Emotionally abused: No    Physically abused: No    Forced sexual activity: No  Other Topics Concern  . Not on file  Social History Narrative  . Not on file     Current Outpatient Medications:  .  amLODipine (NORVASC) 5 MG tablet, Take 1 tablet (5 mg total) by mouth daily after lunch., Disp: 30 tablet, Rfl: 1 .  lisinopril (PRINIVIL,ZESTRIL) 20 MG tablet, Take 1 tablet (20 mg total) by mouth daily., Disp: 30 tablet, Rfl: 1 .  clonazePAM (KLONOPIN) 0.5 MG tablet, Take 0.5 mg by mouth 2 (two) times daily as needed for anxiety., Disp: , Rfl:   No Known Allergies  I personally reviewed active problem list, medication list, allergies, family history, social history, health maintenance, notes from last encounter, lab  results with the patient/caregiver today.  ROS  Ten systems reviewed and is negative except as mentioned in HPI  Objective  Vitals:   11/16/17 0914  BP: 114/70  Pulse: 81  Resp: 14  Temp: 98.3 F (36.8 C)  TempSrc: Oral  SpO2: 99%  Weight: 197 lb 4.8 oz (89.5 kg)  Height: 5\' 5"  (1.651 m)   Body mass index is 32.83 kg/m.  Physical Exam Constitutional:  Patient appears well-developed and well-nourished. No distress.  HENT: Head: Normocephalic and atraumatic. Ears: bilateral TMs with no erythema or effusion; Nose: Nose normal. Mouth/Throat: Oropharynx is clear and moist. No oropharyngeal exudate or tonsillar swelling.  Eyes: Conjunctivae and EOM are normal. No scleral icterus.  Pupils are equal, round, and reactive to light.  Neck: Normal range of motion. Neck supple. No JVD present. Cardiovascular: Normal rate, regular rhythm and normal heart sounds.  No murmur heard. No BLE edema. Decreased in RUL and RLL. No respiratory distress. Congested cough present throughout examination Musculoskeletal: Normal range of motion, no joint effusions. No gross deformities Neurological: Pt is alert and oriented to person, place, and time. No cranial nerve deficit. Coordination, balance, strength, speech and gait are normal.  Skin: Skin is warm and dry. No rash noted. No erythema.  Psychiatric: Patient has a normal mood and affect. behavior is normal. Judgment and thought content normal.  No results found for this or any previous visit (from the past 72 hour(s)).  PHQ2/9: Depression screen PHQ 2/9 11/16/2017  Decreased Interest 0  Down, Depressed, Hopeless 0  PHQ - 2 Score 0  Altered sleeping 0  Tired, decreased energy 0  Change in appetite 0  Feeling bad or failure about yourself  0  Trouble concentrating 0  Moving slowly or fidgety/restless 0  Suicidal thoughts 0  PHQ-9 Score 0  Difficult doing work/chores Not difficult at all   Fall Risk: Fall Risk  11/16/2017  Falls in the past  year? No   Assessment & Plan  1. Acute bronchitis, unspecified organism - Continue albuterol PRN; she has already completed prednisone treatment. Has cough medication from [facility]. - budesonide-formoterol (SYMBICORT) 80-4.5 MCG/ACT inhaler; Inhale 2 puffs into the lungs 2 (two) times daily.  Dispense: 1 Inhaler; Refill: 3 - She will call back if not improving in 1-2 weeks and we will consider CXR.  2. Essential hypertension - DASH diet discussed - lisinopril (PRINIVIL,ZESTRIL) 20 MG tablet; Take 1 tablet (20 mg total) by mouth daily.  Dispense: 90 tablet; Refill: 1 - amLODipine (NORVASC) 5 MG tablet; Take 1 tablet (5 mg total) by mouth daily after lunch.  Dispense: 90 tablet; Refill: 1 - COMPLETE METABOLIC PANEL WITH GFR  3. Need for Tdap vaccination We are out of TDAP today.  4. Lipid screening - Lipid panel  5. Cervical cancer, FIGO stage IB1 ([place]) - Continue follow up with [facility]  6. Tobacco use - Cessation discussed x5 minutes; she is not currently smoking due to illness, does not want to "officially quit", declines medication.  7. Class 1 obesity due to excess calories with serious comorbidity and body mass index (BMI) of 32.0 to 32.9 in adult - Discussed importance of 150 minutes of physical activity weekly, eat two servings of fish weekly, eat one serving of tree nuts ( cashews, pistachios, pecans, almonds.Marland Kitchen) every other day, eat 6 servings of fruit/vegetables daily and drink plenty of water and avoid sweet beverages.  - COMPLETE METABOLIC PANEL WITH GFR - Lipid panel - CBC w/Diff/Platelet  8. Postoperative anemia due to acute blood loss - CBC w/Diff/Platelet - We will recheck CBC to ensure resolution today

## 2017-11-17 ENCOUNTER — Other Ambulatory Visit: Payer: Self-pay | Admitting: Family Medicine

## 2017-11-17 DIAGNOSIS — R740 Nonspecific elevation of levels of transaminase and lactic acid dehydrogenase [LDH]: Principal | ICD-10-CM

## 2017-11-17 DIAGNOSIS — R7401 Elevation of levels of liver transaminase levels: Secondary | ICD-10-CM

## 2017-11-17 DIAGNOSIS — R7989 Other specified abnormal findings of blood chemistry: Secondary | ICD-10-CM

## 2017-11-17 DIAGNOSIS — Z8719 Personal history of other diseases of the digestive system: Secondary | ICD-10-CM

## 2017-11-17 LAB — CBC WITH DIFFERENTIAL/PLATELET
BASOS ABS: 97 {cells}/uL (ref 0–200)
BASOS PCT: 1 %
EOS ABS: 1077 {cells}/uL — AB (ref 15–500)
Eosinophils Relative: 11.1 %
HCT: 37.3 % (ref 35.0–45.0)
HEMOGLOBIN: 12.6 g/dL (ref 11.7–15.5)
Lymphs Abs: 2638 cells/uL (ref 850–3900)
MCH: 29.2 pg (ref 27.0–33.0)
MCHC: 33.8 g/dL (ref 32.0–36.0)
MCV: 86.5 fL (ref 80.0–100.0)
MONOS PCT: 7.5 %
MPV: 9.5 fL (ref 7.5–12.5)
Neutro Abs: 5160 cells/uL (ref 1500–7800)
Neutrophils Relative %: 53.2 %
PLATELETS: 444 10*3/uL — AB (ref 140–400)
RBC: 4.31 10*6/uL (ref 3.80–5.10)
RDW: 13 % (ref 11.0–15.0)
TOTAL LYMPHOCYTE: 27.2 %
WBC: 9.7 10*3/uL (ref 3.8–10.8)
WBCMIX: 728 {cells}/uL (ref 200–950)

## 2017-11-17 LAB — COMPLETE METABOLIC PANEL WITH GFR
AG RATIO: 1.3 (calc) (ref 1.0–2.5)
ALT: 117 U/L — ABNORMAL HIGH (ref 6–29)
AST: 24 U/L (ref 10–30)
Albumin: 3.7 g/dL (ref 3.6–5.1)
Alkaline phosphatase (APISO): 71 U/L (ref 33–115)
BUN: 12 mg/dL (ref 7–25)
CALCIUM: 8.6 mg/dL (ref 8.6–10.2)
CO2: 28 mmol/L (ref 20–32)
Chloride: 104 mmol/L (ref 98–110)
Creat: 0.88 mg/dL (ref 0.50–1.10)
GFR, EST AFRICAN AMERICAN: 95 mL/min/{1.73_m2} (ref >=60)
GFR, EST NON AFRICAN AMERICAN: 82 mL/min/{1.73_m2} (ref >=60)
GLOBULIN: 2.9 g/dL (ref 1.9–3.7)
Glucose, Bld: 75 mg/dL (ref 65–99)
POTASSIUM: 4.2 mmol/L (ref 3.5–5.3)
SODIUM: 139 mmol/L (ref 135–146)
TOTAL PROTEIN: 6.6 g/dL (ref 6.1–8.1)
Total Bilirubin: 0.3 mg/dL (ref 0.2–1.2)

## 2017-11-17 LAB — LIPID PANEL
Cholesterol: 160 mg/dL (ref <200)
HDL: 75 mg/dL (ref >50)
LDL Cholesterol (Calc): 69 mg/dL (calc)
Non-HDL Cholesterol (Calc): 85 mg/dL (calc) (ref <130)
Total CHOL/HDL Ratio: 2.1 (calc) (ref <5.0)
Triglycerides: 75 mg/dL (ref <150)

## 2017-12-15 ENCOUNTER — Telehealth: Payer: Self-pay | Admitting: Family Medicine

## 2017-12-15 NOTE — Telephone Encounter (Signed)
Patient notified

## 2017-12-15 NOTE — Telephone Encounter (Addendum)
-----   Message from Hubbard Hartshorn, FNP sent at 11/17/2017 10:11 AM EDT ----- Regarding: Call to repeat CBC/Order repeat cbc Call to repeat CBC

## 2018-01-04 ENCOUNTER — Ambulatory Visit: Payer: Medicaid Other | Admitting: Gastroenterology

## 2018-01-04 ENCOUNTER — Encounter: Payer: Self-pay | Admitting: Gastroenterology

## 2018-01-04 VITALS — BP 142/87 | HR 76 | Ht 65.0 in | Wt 194.0 lb

## 2018-01-04 DIAGNOSIS — R7989 Other specified abnormal findings of blood chemistry: Secondary | ICD-10-CM

## 2018-01-04 DIAGNOSIS — K581 Irritable bowel syndrome with constipation: Secondary | ICD-10-CM | POA: Diagnosis not present

## 2018-01-04 DIAGNOSIS — R945 Abnormal results of liver function studies: Secondary | ICD-10-CM | POA: Diagnosis not present

## 2018-01-04 DIAGNOSIS — K219 Gastro-esophageal reflux disease without esophagitis: Secondary | ICD-10-CM

## 2018-01-04 MED ORDER — FAMOTIDINE 40 MG PO TABS: 40.0000 mg | ORAL_TABLET | Freq: Every day | ORAL | 1 refills | Status: DC

## 2018-01-04 NOTE — Patient Instructions (Signed)
High-Fiber Diet  Fiber, also called dietary fiber, is a type of carbohydrate found in fruits, vegetables, whole grains, and beans. A high-fiber diet can have many health benefits. Your health care provider may recommend a high-fiber diet to help:  · Prevent constipation. Fiber can make your bowel movements more regular.  · Lower your cholesterol.  · Relieve hemorrhoids, uncomplicated diverticulosis, or irritable bowel syndrome.  · Prevent overeating as part of a weight-loss plan.  · Prevent heart disease, type 2 diabetes, and certain cancers.    What is my plan?  The recommended daily intake of fiber includes:  · 38 grams for men under age 50.  · 30 grams for men over age 50.  · 25 grams for women under age 50.  · 21 grams for women over age 50.    You can get the recommended daily intake of dietary fiber by eating a variety of fruits, vegetables, grains, and beans. Your health care provider may also recommend a fiber supplement if it is not possible to get enough fiber through your diet.  What do I need to know about a high-fiber diet?  · Fiber supplements have not been widely studied for their effectiveness, so it is better to get fiber through food sources.  · Always check the fiber content on the nutrition facts label of any prepackaged food. Look for foods that contain at least 5 grams of fiber per serving.  · Ask your dietitian if you have questions about specific foods that are related to your condition, especially if those foods are not listed in the following section.  · Increase your daily fiber consumption gradually. Increasing your intake of dietary fiber too quickly may cause bloating, cramping, or gas.  · Drink plenty of water. Water helps you to digest fiber.  What foods can I eat?  Grains  Whole-grain breads. Multigrain cereal. Oats and oatmeal. Brown rice. Barley. Bulgur wheat. Millet. Bran muffins. Popcorn. Rye wafer crackers.  Vegetables   Sweet potatoes. Spinach. Kale. Artichokes. Cabbage. Broccoli. Green peas. Carrots. Squash.  Fruits  Berries. Pears. Apples. Oranges. Avocados. Prunes and raisins. Dried figs.  Meats and Other Protein Sources  Navy, kidney, pinto, and soy beans. Split peas. Lentils. Nuts and seeds.  Dairy  Fiber-fortified yogurt.  Beverages  Fiber-fortified soy milk. Fiber-fortified orange juice.  Other  Fiber bars.  The items listed above may not be a complete list of recommended foods or beverages. Contact your dietitian for more options.  What foods are not recommended?  Grains  White bread. Pasta made with refined flour. White rice.  Vegetables  Fried potatoes. Canned vegetables. Well-cooked vegetables.  Fruits  Fruit juice. Cooked, strained fruit.  Meats and Other Protein Sources  Fatty cuts of meat. Fried poultry or fried fish.  Dairy  Milk. Yogurt. Cream cheese. Sour cream.  Beverages  Soft drinks.  Other  Cakes and pastries. Butter and oils.  The items listed above may not be a complete list of foods and beverages to avoid. Contact your dietitian for more information.  What are some tips for including high-fiber foods in my diet?  · Eat a wide variety of high-fiber foods.  · Make sure that half of all grains consumed each day are whole grains.  · Replace breads and cereals made from refined flour or white flour with whole-grain breads and cereals.  · Replace white rice with brown rice, bulgur wheat, or millet.  · Start the day with a breakfast that is high in fiber,   such as a cereal that contains at least 5 grams of fiber per serving.  · Use beans in place of meat in soups, salads, or pasta.  · Eat high-fiber snacks, such as berries, raw vegetables, nuts, or popcorn.  This information is not intended to replace advice given to you by your health care provider. Make sure you discuss any questions you have with your health care provider.  Document Released: 01/11/2005 Document Revised: 06/19/2015 Document Reviewed: 06/26/2013   Elsevier Interactive Patient Education © 2018 Elsevier Inc.

## 2018-01-04 NOTE — Progress Notes (Signed)
Jonathon Bellows MD, MRCP(U.K) [address]  [place]  [place], [place] [address]  Main: [phone]  Fax: [phone]   Gastroenterology Consultation  Referring Provider:     Hubbard Hartshorn, FNP Primary Care Physician:  Hubbard Hartshorn, FNP Primary Gastroenterologist:  Dr. Jonathon Bellows  Reason for Consultation:     Abnormal LFT's         HPI:   Cassidy Mathis is a 42 y.o. y/o female referred for consultation & management  by Dr. Uvaldo Rising, Astrid Divine, [place].     I had seen her back in 11/2016 when she was admitted with pancreatitis. She underwent a laparoscopic cholecystectomy 10/15/2015 by Dr. Crista Elliot for cholelithiasis.  She was admitted on 12/15/2016 with pancreatitis.  She had also been on NSAIDs prior to this.  Intraoperative cholangiogram did not show any stones in the common bile duct.She underwent an MRCP on 12/16/2016 demonstrated choledocholithiasis and 2 small calculi in the distant common bile duct.  Then had an ERCP with extraction.Developed post ERCP pancreatitis.   Today she has been referred for ALT 117 .She has a history of Ca cervix .  LFt's were normal previously.    No fevers, no muscle pains , on and off has had a RLQ pain , feels like "have to poop but its stuck", has a bowel movement every other day, a bowel movement helps. Ongoing for many years. No rectal bleeding . Has the pain only when she is constipated, presently has softer bowel movements  Has never had a colonoscopy. No family history of colon ancer. No recent injury . No OTC meds or herbal supplements.   Takes Advil and tylenol as needed for headaches. Not too often, occasional alcohol. No illegal drug use . Some marijuana use. Gained weight.   Says last 2.5 years , feels like acid refflux, has excruciating at anytime in her chest , non radiating, worse when she moves, has to lay flat, feels like a bad heartburn - 5 times worse. , breaks into a bad sweat . Occurred for 3 months and then stopped, then  recurred and then stopped. No regurgitation .     Past Medical History:  Diagnosis Date  . Allergy    seasonal  . Anemia   . Cancer ([place])   . Depression   . GERD (gastroesophageal reflux disease)    RARE-NO MEDS  . Headache    MIGRAINES  . Hypertension    PT STATES SHE IS SUPPOSED TO BE TAKING LISINOPRIL-HCTZ BUT HAS BEEN OUT "FOR A WHILE" NEEDS TO GET ANOTHER PRESCRIPTION FROM HER PCP  . Pancreatitis   . Pilonidal cyst    TOOK ANTIBIOTIC THIS MONTH AND IT IS RESOLVED    Past Surgical History:  Procedure Laterality Date  . ABDOMINAL HYSTERECTOMY    . CERVICAL CONIZATION W/BX N/A 03/23/2017   Procedure: CONIZATION CERVIX WITH BIOPSY;  Surgeon: Gillis Ends, MD;  Location: [facility] ORS;  Service: Gynecology;  Laterality: N/A;  . CHOLECYSTECTOMY  10/21/2015   Procedure: LAPAROSCOPIC CHOLECYSTECTOMY WITH INTRAOPERATIVE CHOLANGIOGRAM;  Surgeon: Jules Husbands, MD;  Location: [facility] ORS;  Service: General;;  . CHOLECYSTECTOMY Bilateral   . CYST EXCISION     tongue AND WRIST  . EPIGASTRIC HERNIA REPAIR N/A 04/18/2015   Procedure: HERNIA REPAIR EPIGASTRIC ADULT;  Surgeon: Jules Husbands, MD;  Location: [facility] ORS;  Service: General;  Laterality: N/A;  . ERCP N/A 12/21/2016   Procedure: ENDOSCOPIC RETROGRADE CHOLANGIOPANCREATOGRAPHY (ERCP);  Surgeon: [name], MD;  Location: [facility] ENDOSCOPY;  Service: Endoscopy;  Laterality: N/A;  . HERNIA REPAIR    . RADICAL HYSTERECTOMY  04/14/2017  . SALPINGECTOMY Bilateral 04/14/2017  . SENTINEL LYMPH NODE BIOPSY    . TUBAL LIGATION      Prior to Admission medications   Medication Sig Start Date End Date Taking? Authorizing Provider  amLODipine (NORVASC) 5 MG tablet Take 1 tablet (5 mg total) by mouth daily after lunch. 11/16/17  Yes Hubbard Hartshorn, FNP  budesonide-formoterol (SYMBICORT) 80-4.5 MCG/ACT inhaler Inhale 2 puffs into the lungs 2 (two) times daily. 11/16/17  Yes Hubbard Hartshorn, FNP  lisinopril (PRINIVIL,ZESTRIL) 20 MG tablet  Take 1 tablet (20 mg total) by mouth daily. 11/16/17  Yes Hubbard Hartshorn, FNP  clonazePAM (KLONOPIN) 0.5 MG tablet Take 0.5 mg by mouth 2 (two) times daily as needed for anxiety.    [provider]    Family History  Problem Relation Age of Onset  . Hypertension Mother   . Gallstones Mother   . Hypertension Father   . Gout Father   . Cancer Paternal Uncle        lung  . Cancer Maternal Grandmother        not sure what type of cancer  . Cancer Maternal Grandfather        not sure what type of cancer  . Cancer Paternal Grandmother        not sure what type of cancer  . Cancer Paternal Grandfather        not sure what type of cancer     Social History   Tobacco Use  . Smoking status: Current Every Day Smoker    Packs/day: 0.50    Years: 12.00    Pack years: 6.00    Types: Cigarettes  . Smokeless tobacco: Never Used  Substance Use Topics  . Alcohol use: No    Alcohol/week: 0.0 standard drinks    Frequency: Never  . Drug use: Yes    Types: Marijuana    Comment: "not often"    Allergies as of 01/04/2018  . (No Known Allergies)    Review of Systems:    All systems reviewed and negative except where noted in HPI.   Physical Exam:  BP (!) 142/87   Pulse 76   Ht 5\' 5"  (1.651 m)   Wt 194 lb (88 kg)   LMP 03/18/2017 (Exact Date)   BMI 32.28 kg/m  Patient's last menstrual period was 03/18/2017 (exact date). Psych:  Alert and cooperative. Normal mood and affect. General:   Alert,  Well-developed, well-nourished, pleasant and cooperative in NAD Head:  Normocephalic and atraumatic. Eyes:  Sclera clear, no icterus.   Conjunctiva pink. Ears:  Normal auditory acuity. Nose:  No deformity, discharge, or lesions. Mouth:  No deformity or lesions,oropharynx pink & moist. Neck:  Supple; no masses or thyromegaly. Lungs:  Respirations even and unlabored.  Clear throughout to auscultation.   No wheezes, crackles, or rhonchi. No acute distress. Heart:  Regular rate and  rhythm; no murmurs, clicks, rubs, or gallops. Abdomen:  Normal bowel sounds.  No bruits.  Soft, non-tender and non-distended without masses, hepatosplenomegaly or hernias noted.  No guarding or rebound tenderness.    Neurologic:  Alert and oriented x3;  grossly normal neurologically. Skin:  Intact without significant lesions or rashes. No jaundice. Lymph Nodes:  No significant cervical adenopathy. Psych:  Alert and cooperative. Normal mood and affect.  Imaging Studies: No results found.  Assessment and Plan:  Cassidy Mathis is a 42 y.o. y/o female has been referred for abnormal LFT's. In addition has features sugestive of GERD and IBS-C   Plan  1. Linzess 72 mcg for 1 week samples provided 2. Pepcid trial for 4-6 weeks 3. Check LFT's, CK,GGT, RUQ USG- if abnormal will perform further testing .  4. High fiber diet -patient information provided and counseled   Follow up in 4 weeks   Dr Jonathon Bellows MD,MRCP(U.K)

## 2018-01-05 ENCOUNTER — Other Ambulatory Visit: Payer: Self-pay | Admitting: Family Medicine

## 2018-01-05 DIAGNOSIS — R748 Abnormal levels of other serum enzymes: Secondary | ICD-10-CM

## 2018-01-05 DIAGNOSIS — R7989 Other specified abnormal findings of blood chemistry: Secondary | ICD-10-CM

## 2018-01-05 DIAGNOSIS — R945 Abnormal results of liver function studies: Principal | ICD-10-CM

## 2018-01-05 LAB — HEPATIC FUNCTION PANEL
ALT: 15 IU/L (ref 0–32)
AST: 16 IU/L (ref 0–40)
Albumin: 4 g/dL (ref 3.5–5.5)
Alkaline Phosphatase: 62 IU/L (ref 39–117)
BILIRUBIN, DIRECT: 0.05 mg/dL (ref 0.00–0.40)
Total Protein: 6.7 g/dL (ref 6.0–8.5)

## 2018-01-05 LAB — GAMMA GT: GGT: 10 IU/L (ref 0–60)

## 2018-01-05 LAB — CK: CK TOTAL: 191 U/L — AB (ref 24–173)

## 2018-01-09 ENCOUNTER — Telehealth: Payer: Self-pay | Admitting: Gastroenterology

## 2018-01-09 NOTE — Telephone Encounter (Signed)
Pt is calling for test results °

## 2018-01-10 NOTE — Telephone Encounter (Signed)
Spoke with pt and informed her of lab results and Dr. Georgeann Oppenheim instructions for pt to follow up with her PCP to have labs repeated in 3 months.

## 2018-01-11 ENCOUNTER — Ambulatory Visit: Payer: Medicaid Other

## 2018-02-01 ENCOUNTER — Ambulatory Visit: Payer: Medicaid Other | Attending: Gastroenterology

## 2018-02-01 ENCOUNTER — Telehealth: Payer: Self-pay | Admitting: Gastroenterology

## 2018-02-01 NOTE — Telephone Encounter (Signed)
[facility] is calling to inform us pt no showed her apt today

## 2018-02-22 ENCOUNTER — Encounter: Payer: Self-pay | Admitting: Gastroenterology

## 2018-02-22 ENCOUNTER — Ambulatory Visit: Payer: Medicaid Other | Admitting: Gastroenterology

## 2018-02-22 VITALS — BP 140/84 | HR 86 | Ht 65.0 in | Wt 196.4 lb

## 2018-02-22 DIAGNOSIS — R945 Abnormal results of liver function studies: Secondary | ICD-10-CM

## 2018-02-22 DIAGNOSIS — R197 Diarrhea, unspecified: Secondary | ICD-10-CM

## 2018-02-22 DIAGNOSIS — K219 Gastro-esophageal reflux disease without esophagitis: Secondary | ICD-10-CM | POA: Diagnosis not present

## 2018-02-22 DIAGNOSIS — R7989 Other specified abnormal findings of blood chemistry: Secondary | ICD-10-CM

## 2018-02-22 MED ORDER — DICYCLOMINE HCL 10 MG PO CAPS: 10.0000 mg | ORAL_CAPSULE | Freq: Three times a day (TID) | ORAL | 0 refills | Status: DC

## 2018-02-22 NOTE — Progress Notes (Signed)
Jonathon Bellows MD, MRCP(U.K) [address]  [place]  [place], [place] [address]  Main: [phone]  Fax: [phone]   Primary Care Physician: Hubbard Hartshorn, FNP  Primary Gastroenterologist:  Dr. Jonathon Bellows   No chief complaint on file.   HPI: Cassidy Mathis is a 43 y.o. female   Summary of history :  Initially referred and seen on 01/04/18 for abnormal LFT's  ALT 117. LFt's were normal previously,IBS-C,NSAID use ,GERD.    I had seen her back in 11/2016 when she was admitted with pancreatitis. She underwent a laparoscopic cholecystectomy 10/15/2015 by Dr. Crista Elliot for cholelithiasis. She was admitted on 12/15/2016 with pancreatitis. She had also been on NSAIDs prior to this. Intraoperative cholangiogram did not show any stones in the common bile duct.She underwent an MRCP on 12/16/2016 demonstrated choledocholithiasis and 2 small calculi in the distant common bile duct. Then had an ERCP with extraction.Developed post ERCP pancreatitis.   No fevers, no muscle pains , on and off has had a RLQ pain , feels like "have to poop but its stuck", has a bowel movement every other day, a bowel movement helps. Ongoing for many years. No rectal bleeding . Has the pain only when she is constipated, presently has softer bowel movements  Has never had a colonoscopy. No family history of colon ancer. No recent injury . No OTC meds or herbal supplements.    Interval history   01/05/2019-  02/22/2018  01/04/2018: LFT normal. CK elevated at 191,GGT normal  . I suggested  Hubbard Hartshorn, FNP to recheck CK and LFT's in 3 months and if CK is persistently elevated will need evaluation by Rheumatology for conditions such as polymyositis  Did not take Pepcid- not having any issues Constipation : loose bowels - says didn't have to take it- says 4-5 bowel movememts a day , says everytime she eats has to go . Says been so for a few weeks, not on any laxatives. No blood in stool       Current Outpatient Medications  Medication Sig Dispense Refill  . amLODipine (NORVASC) 5 MG tablet Take 1 tablet (5 mg total) by mouth daily after lunch. 90 tablet 1  . azithromycin (ZITHROMAX) 250 MG tablet TK 2 TAS PO ON DAY 1 THEN TK 1 T PO QD UNTIL FINISHED  0  . brompheniramine-pseudoephedrine-DM 30-2-10 MG/5ML syrup TK 10 ML PO NIGHTLY PRN  0  . budesonide-formoterol (SYMBICORT) 80-4.5 MCG/ACT inhaler Inhale 2 puffs into the lungs 2 (two) times daily. 1 Inhaler 3  . clonazePAM (KLONOPIN) 0.5 MG tablet Take 0.5 mg by mouth 2 (two) times daily as needed for anxiety.    . famotidine (PEPCID) 40 MG tablet Take 1 tablet (40 mg total) by mouth daily. 90 tablet 1  . lisinopril (PRINIVIL,ZESTRIL) 20 MG tablet Take 1 tablet (20 mg total) by mouth daily. 90 tablet 1  . predniSONE (DELTASONE) 20 MG tablet TK 3 T PO  QD  0  . PROAIR HFA 108 (90 Base) MCG/ACT inhaler INHALE 1 PUFFS Q 4 TO 6 HOURS PRN  0   No current facility-administered medications for this visit.     Allergies as of 02/22/2018  . (No Known Allergies)    ROS:  General: Negative for anorexia, weight loss, fever, chills, fatigue, weakness. ENT: Negative for hoarseness, difficulty swallowing , nasal congestion. CV: Negative for chest pain, angina, palpitations, dyspnea on exertion, peripheral edema.  Respiratory: Negative for dyspnea at rest, dyspnea on exertion, cough,  sputum, wheezing.  GI: See history of present illness. GU:  Negative for dysuria, hematuria, urinary incontinence, urinary frequency, nocturnal urination.  Endo: Negative for unusual weight change.    Physical Examination:   LMP 03/18/2017 (Exact Date)   General: Well-nourished, well-developed in no acute distress.  Eyes: No icterus. Conjunctivae pink. Mouth: Oropharyngeal mucosa moist and pink , no lesions erythema or exudate. Lungs: Clear to auscultation bilaterally. Non-labored. Heart: Regular rate and rhythm, no murmurs rubs or gallops.   Abdomen: Bowel sounds are normal, nontender, nondistended, no hepatosplenomegaly or masses, no abdominal bruits or hernia , no rebound or guarding.   Extremities: No lower extremity edema. No clubbing or deformities. Neuro: Alert and oriented x 3.  Grossly intact. Skin: Warm and dry, no jaundice.   Psych: Alert and cooperative, normal mood and affect.   Imaging Studies: No results found.  Assessment and Plan:   Cassidy Mathis is a 43 y.o. y/o female here to follow up for  abnormal LFT's which resolved, noted to have elevated CK which could have caused the ALT to rise. IBS-C and GERD.Today she says no constipation but has diarrhea for a few weeks   Plan  1. Start bentyl QID 2. Pepcid if reflux returns 3. CK elevated in 12/2017 :  Hubbard Hartshorn, FNP to recheck in a few weeks  4. Stool studies   Dr Jonathon Bellows  MD,MRCP [facility]) Follow up in 4 weeks

## 2018-02-23 ENCOUNTER — Other Ambulatory Visit: Payer: Self-pay

## 2018-02-23 ENCOUNTER — Emergency Department: Payer: Medicaid Other

## 2018-02-23 ENCOUNTER — Emergency Department
Admission: EM | Admit: 2018-02-23 | Discharge: 2018-02-23 | Disposition: A | Discharge status: Home / Self Care | Payer: Medicaid Other | Attending: Emergency Medicine | Admitting: Emergency Medicine

## 2018-02-23 ENCOUNTER — Encounter: Payer: Self-pay | Admitting: Emergency Medicine

## 2018-02-23 DIAGNOSIS — R0981 Nasal congestion: Secondary | ICD-10-CM | POA: Diagnosis not present

## 2018-02-23 DIAGNOSIS — R0600 Dyspnea, unspecified: Secondary | ICD-10-CM | POA: Insufficient documentation

## 2018-02-23 DIAGNOSIS — Z8541 Personal history of malignant neoplasm of cervix uteri: Secondary | ICD-10-CM | POA: Diagnosis not present

## 2018-02-23 DIAGNOSIS — I10 Essential (primary) hypertension: Secondary | ICD-10-CM | POA: Insufficient documentation

## 2018-02-23 DIAGNOSIS — R05 Cough: Secondary | ICD-10-CM | POA: Diagnosis present

## 2018-02-23 DIAGNOSIS — J181 Lobar pneumonia, unspecified organism: Secondary | ICD-10-CM | POA: Diagnosis not present

## 2018-02-23 DIAGNOSIS — F17210 Nicotine dependence, cigarettes, uncomplicated: Secondary | ICD-10-CM | POA: Diagnosis not present

## 2018-02-23 DIAGNOSIS — F1210 Cannabis abuse, uncomplicated: Secondary | ICD-10-CM | POA: Diagnosis not present

## 2018-02-23 DIAGNOSIS — J189 Pneumonia, unspecified organism: Secondary | ICD-10-CM

## 2018-02-23 DIAGNOSIS — Z79899 Other long term (current) drug therapy: Secondary | ICD-10-CM | POA: Diagnosis not present

## 2018-02-23 MED ORDER — BENZONATATE 100 MG PO CAPS: 200.0000 mg | ORAL_CAPSULE | Freq: Three times a day (TID) | ORAL | 0 refills | Status: DC | PRN

## 2018-02-23 MED ORDER — FEXOFENADINE-PSEUDOEPHED ER 60-120 MG PO TB12: 1.0000 | ORAL_TABLET | Freq: Two times a day (BID) | ORAL | 0 refills | Status: DC

## 2018-02-23 MED ORDER — AZITHROMYCIN 250 MG PO TABS: ORAL_TABLET | ORAL | 0 refills | Status: AC

## 2018-02-23 NOTE — ED Triage Notes (Signed)
Cough and congestion for 2 weeks.  No fever.  Sounds nasally congested

## 2018-02-23 NOTE — ED Provider Notes (Signed)
[facility] Emergency Department Provider Note   ____________________________________________   First MD Initiated Contact with Patient 02/23/18 [phone]     (approximate)  I have reviewed the triage vital signs and the nursing notes.   HISTORY  Chief Complaint Cough    HPI Cassidy Mathis is a 43 y.o. female patient presents with 2 weeks of cough and congestion.  Patient also states she has nasal congestion.  Patient denies fever this complaint.  Patient states she saw PCP 2 weeks ago he was given cough syrup.  Patient had a cough syrup has not improved her complaint.  Patient complains of dyspnea with exertion.   Patient rates her pain as a 10/10.  Patient described the pain as "achy".   Past Medical History:  Diagnosis Date  . Allergy    seasonal  . Anemia   . Cancer ([place])   . Depression   . GERD (gastroesophageal reflux disease)    RARE-NO MEDS  . Headache    MIGRAINES  . Hypertension    PT STATES SHE IS SUPPOSED TO BE TAKING LISINOPRIL-HCTZ BUT HAS BEEN OUT "FOR A WHILE" NEEDS TO GET ANOTHER PRESCRIPTION FROM HER PCP  . Pancreatitis   . Pilonidal cyst    TOOK ANTIBIOTIC THIS MONTH AND IT IS RESOLVED    Patient Active Problem List   Diagnosis Date Noted  . Cervical cancer, FIGO stage IB1 ([place]) 05/17/2017  . Ileus ([place]) 04/18/2017  . Postoperative anemia due to acute blood loss 04/18/2017  . Tobacco use 04/06/2017  . 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 Common bile duct stone   . Pancreatitis 12/16/2016  . 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 Incarcerated epigastric hernia     Past Surgical History:  Procedure Laterality Date  . ABDOMINAL HYSTERECTOMY    . CERVICAL CONIZATION W/BX N/A 03/23/2017   Procedure: CONIZATION CERVIX WITH BIOPSY;  Surgeon: Gillis Ends, MD;  Location: [facility] ORS;  Service: Gynecology;  Laterality: N/A;  . CHOLECYSTECTOMY  10/21/2015   Procedure:  LAPAROSCOPIC CHOLECYSTECTOMY WITH INTRAOPERATIVE CHOLANGIOGRAM;  Surgeon: Jules Husbands, MD;  Location: [facility] ORS;  Service: General;;  . CHOLECYSTECTOMY Bilateral   . CYST EXCISION     tongue AND WRIST  . EPIGASTRIC HERNIA REPAIR N/A 04/18/2015   Procedure: HERNIA REPAIR EPIGASTRIC ADULT;  Surgeon: Jules Husbands, MD;  Location: [facility] ORS;  Service: General;  Laterality: N/A;  . ERCP N/A 12/21/2016   Procedure: ENDOSCOPIC RETROGRADE CHOLANGIOPANCREATOGRAPHY (ERCP);  Surgeon: [name], MD;  Location: [facility] ENDOSCOPY;  Service: Endoscopy;  Laterality: N/A;  . HERNIA REPAIR    . RADICAL HYSTERECTOMY  04/14/2017  . SALPINGECTOMY Bilateral 04/14/2017  . SENTINEL LYMPH NODE BIOPSY    . TUBAL LIGATION      Prior to Admission medications   Medication Sig Start Date End Date Taking? Authorizing Provider  amLODipine (NORVASC) 5 MG tablet Take 1 tablet (5 mg total) by mouth daily after lunch. 11/16/17   Hubbard Hartshorn, FNP  azithromycin (ZITHROMAX Z-PAK) 250 MG tablet Take 2 tablets (500 mg) on  Day 1,  followed by 1 tablet (250 mg) once daily on Days 2 through 5. 02/23/18 02/28/18  Sable Feil, PA-C  azithromycin (ZITHROMAX) 250 MG tablet TK 2 TAS PO ON DAY 1 THEN TK 1 T PO QD UNTIL FINISHED 01/01/18   [provider]  benzonatate (TESSALON PERLES) 100 MG capsule Take 2 capsules (200 mg total) by mouth 3 (  three) times daily as needed. 02/23/18 02/23/19  Sable Feil, PA-C  brompheniramine-pseudoephedrine-DM 30-2-10 MG/5ML syrup TK 10 ML PO NIGHTLY PRN 01/01/18   [provider]  budesonide-formoterol (SYMBICORT) 80-4.5 MCG/ACT inhaler Inhale 2 puffs into the lungs 2 (two) times daily. Patient not taking: Reported on 02/22/2018 11/16/17   Hubbard Hartshorn, FNP  clonazePAM (KLONOPIN) 0.5 MG tablet Take 0.5 mg by mouth 2 (two) times daily as needed for anxiety.    [provider]  clotrimazole (LOTRIMIN) 1 % cream APP EXT AA BID 02/05/18   [provider]  dicyclomine  (BENTYL) 10 MG capsule Take 1 capsule (10 mg total) by mouth 4 (four) times daily -  before meals and at bedtime. 02/22/18   Jonathon Bellows, MD  famotidine (PEPCID) 40 MG tablet Take 1 tablet (40 mg total) by mouth daily. Patient not taking: Reported on 02/22/2018 01/04/18 04/05/18  Jonathon Bellows, MD  fexofenadine-pseudoephedrine (ALLEGRA-D) 60-120 MG 12 hr tablet Take 1 tablet by mouth 2 (two) times daily. 02/23/18   Sable Feil, PA-C  lisinopril (PRINIVIL,ZESTRIL) 20 MG tablet Take 1 tablet (20 mg total) by mouth daily. 11/16/17   Hubbard Hartshorn, FNP  predniSONE (DELTASONE) 20 MG tablet TK 3 T PO  QD 01/01/18   [provider]  PROAIR HFA 108 (90 Base) MCG/ACT inhaler INHALE 1 PUFFS Q 4 TO 6 HOURS PRN 01/01/18   [provider]    Allergies Patient has no known allergies.  Family History  Problem Relation Age of Onset  . Hypertension Mother   . Gallstones Mother   . Hypertension Father   . Gout Father   . Cancer Paternal Uncle        lung  . Cancer Maternal Grandmother        not sure what type of cancer  . Cancer Maternal Grandfather        not sure what type of cancer  . Cancer Paternal Grandmother        not sure what type of cancer  . Cancer Paternal Grandfather        not sure what type of cancer    Social History Social History   Tobacco Use  . Smoking status: Current Every Day Smoker    Packs/day: 0.50    Years: 12.00    Pack years: 6.00    Types: Cigarettes  . Smokeless tobacco: Never Used  Substance Use Topics  . Alcohol use: No    Alcohol/week: 0.0 standard drinks    Frequency: Never  . Drug use: Yes    Types: Marijuana    Comment: "not often"    Review of Systems Constitutional: No fever/chills Eyes: No visual changes. ENT: No sore throat.  Nasal congestion. Cardiovascular: Denies chest pain. Respiratory: States shortness of breath with exertion.  Productive cough. Gastrointestinal: No abdominal pain.  No nausea, no vomiting.  No  diarrhea.  No constipation. Genitourinary: Negative for dysuria. Musculoskeletal: Negative for back pain. Skin: Negative for rash. Neurological: Negative for headaches, focal weakness or numbness.   ____________________________________________   PHYSICAL EXAM:  VITAL SIGNS: ED Triage Vitals  Enc Vitals Group     BP 02/23/18 0904 (!) 162/102     Pulse Rate 02/23/18 0904 78     Resp 02/23/18 0904 18     Temp 02/23/18 0904 98.3 F (36.8 C)     Temp Source 02/23/18 0904 Oral     SpO2 02/23/18 0904 95 %     Weight 02/23/18  0849 196 lb 3.4 oz (89 kg)     Height 02/23/18 0849 5\' 5"  (1.651 m)     Head Circumference --      Peak Flow --      Pain Score 02/23/18 0849 10     Pain Loc --      Pain Edu? --      Excl. in [place]? --     Constitutional: Alert and oriented. Well appearing and in no acute distress. Eyes: Conjunctivae are normal. PERRL. EOMI. Head: Atraumatic. Nose: Bilateral maxillary guarding with edematous nasal turbinates. Mouth/Throat: Mucous membranes are moist.  Oropharynx non-erythematous.  Postnasal drainage. Neck: No stridor.  Hematological/Lymphatic/Immunilogical: No cervical lymphadenopathy. Cardiovascular: Normal rate, regular rhythm. Grossly normal heart sounds.  Good peripheral circulation.  Elevated blood pressure.  Patient states did not take her blood pressure medicine this morning. Respiratory: Normal respiratory effort.  No retractions. Lungs CTAB. Musculoskeletal: No lower extremity tenderness nor edema.  No joint effusions. Neurologic:  Normal speech and language. No gross focal neurologic deficits are appreciated. No gait instability. Skin:  Skin is warm, dry and intact. No rash noted. Psychiatric: Mood and affect are normal. Speech and behavior are normal.  ____________________________________________   LABS (all labs ordered are listed, but only abnormal results are displayed)  Labs Reviewed - No data to  display ____________________________________________  EKG   ____________________________________________  RADIOLOGY  ED MD interpretation:    Official radiology report(s): Dg Chest 2 View  Result Date: 02/23/2018 CLINICAL DATA:  Cough and congestion for 2 weeks EXAM: CHEST - 2 VIEW COMPARISON:  October 04, 2015 FINDINGS: The heart size and mediastinal contours are within normal limits. There is consolidation of the anterior lung base best seen on lateral view consistent with pneumonia. There is no pulmonary edema or pleural effusion. The visualized skeletal structures are unremarkable. IMPRESSION: Consolidation of the anterior lung base best seen on lateral view, consistent with pneumonia. Electronically Signed   By: Abelardo Diesel M.D.   On: 02/23/2018 09:52    ____________________________________________   PROCEDURES  Procedure(s) performed:   Procedures  Critical Care performed: No  ____________________________________________   INITIAL IMPRESSION / ASSESSMENT AND PLAN / ED COURSE  As part of my medical decision making, I reviewed the following data within the [place]     Patient presents with 2 weeks of cough, nasal and chest congestion.  Patient denies fever.  Patient x-ray was consistent with pneumonia.  Patient given discharge care instruction advised take medication as directed.  Patient given a work note advised follow-up with PCP if no improvement in 3 to 5 days.  Return to ED if condition worsens.      ____________________________________________   FINAL CLINICAL IMPRESSION(S) / ED DIAGNOSES  Final diagnoses:  Community acquired pneumonia of right lower lobe of lung [facility])     ED Discharge Orders         Ordered    azithromycin (ZITHROMAX Z-PAK) 250 MG tablet     02/23/18 1027    fexofenadine-pseudoephedrine (ALLEGRA-D) 60-120 MG 12 hr tablet  2 times daily     02/23/18 1027    benzonatate (TESSALON PERLES) 100 MG capsule  3  times daily PRN     02/23/18 1027           Note:  This document was prepared using Dragon voice recognition software and may include unintentional dictation errors.    Sable Feil, PA-C 02/23/18 1042    Delman Kitten, MD 02/23/18 2129

## 2018-02-23 NOTE — ED Notes (Signed)
Pt provided a drink, ok per PA

## 2018-02-23 NOTE — ED Notes (Signed)
Pt taken to xray 

## 2018-02-28 ENCOUNTER — Emergency Department
Admission: EM | Admit: 2018-02-28 | Discharge: 2018-02-28 | Disposition: A | Discharge status: Home / Self Care | Payer: Medicaid Other | Attending: Student in an Organized Health Care Education/Training Program | Admitting: Student in an Organized Health Care Education/Training Program

## 2018-02-28 ENCOUNTER — Encounter: Payer: Self-pay | Admitting: Emergency Medicine

## 2018-02-28 ENCOUNTER — Emergency Department: Payer: Medicaid Other

## 2018-02-28 DIAGNOSIS — J181 Lobar pneumonia, unspecified organism: Secondary | ICD-10-CM | POA: Diagnosis not present

## 2018-02-28 DIAGNOSIS — I10 Essential (primary) hypertension: Secondary | ICD-10-CM | POA: Diagnosis not present

## 2018-02-28 DIAGNOSIS — Z8541 Personal history of malignant neoplasm of cervix uteri: Secondary | ICD-10-CM | POA: Insufficient documentation

## 2018-02-28 DIAGNOSIS — R059 Cough, unspecified: Secondary | ICD-10-CM

## 2018-02-28 DIAGNOSIS — R05 Cough: Secondary | ICD-10-CM | POA: Diagnosis present

## 2018-02-28 DIAGNOSIS — Z79899 Other long term (current) drug therapy: Secondary | ICD-10-CM | POA: Insufficient documentation

## 2018-02-28 DIAGNOSIS — J189 Pneumonia, unspecified organism: Secondary | ICD-10-CM

## 2018-02-28 DIAGNOSIS — F17210 Nicotine dependence, cigarettes, uncomplicated: Secondary | ICD-10-CM | POA: Diagnosis not present

## 2018-02-28 LAB — CBC WITH DIFFERENTIAL/PLATELET
Abs Immature Granulocytes: 0.01 10*3/uL (ref 0.00–0.07)
Basophils Absolute: 0 10*3/uL (ref 0.0–0.1)
Basophils Relative: 1 %
Eosinophils Absolute: 0.9 10*3/uL — ABNORMAL HIGH (ref 0.0–0.5)
Eosinophils Relative: 10 %
HCT: 37.9 % (ref 36.0–46.0)
Hemoglobin: 12.5 g/dL (ref 12.0–15.0)
Immature Granulocytes: 0 %
LYMPHS PCT: 34 %
Lymphs Abs: 3 10*3/uL (ref 0.7–4.0)
MCH: 29.3 pg (ref 26.0–34.0)
MCHC: 33 g/dL (ref 30.0–36.0)
MCV: 89 fL (ref 80.0–100.0)
Monocytes Absolute: 0.6 10*3/uL (ref 0.1–1.0)
Monocytes Relative: 7 %
Neutro Abs: 4.3 10*3/uL (ref 1.7–7.7)
Neutrophils Relative %: 48 %
Platelets: 402 10*3/uL — ABNORMAL HIGH (ref 150–400)
RBC: 4.26 MIL/uL (ref 3.87–5.11)
RDW: 12.5 % (ref 11.5–15.5)
WBC: 8.8 10*3/uL (ref 4.0–10.5)
nRBC: 0 % (ref 0.0–0.2)

## 2018-02-28 LAB — BASIC METABOLIC PANEL
Anion gap: 7 (ref 5–15)
BUN: 14 mg/dL (ref 6–20)
CHLORIDE: 106 mmol/L (ref 98–111)
CO2: 26 mmol/L (ref 22–32)
Calcium: 9.4 mg/dL (ref 8.9–10.3)
Creatinine, Ser: 0.91 mg/dL (ref 0.44–1.00)
GFR calc Af Amer: >60 mL/min (ref >60)
GFR calc non Af Amer: >60 mL/min (ref >60)
GLUCOSE: 96 mg/dL (ref 70–99)
Potassium: 3.3 mmol/L — ABNORMAL LOW (ref 3.5–5.1)
Sodium: 139 mmol/L (ref 135–145)

## 2018-02-28 MED ORDER — LEVOFLOXACIN 500 MG PO TABS
500.0000 mg | ORAL_TABLET | Freq: Once | ORAL | Status: AC
  Administered 2018-02-28: 500 mg via ORAL
  Filled 2018-02-28: qty 1

## 2018-02-28 MED ORDER — HYDROCOD POLST-CPM POLST ER 10-8 MG/5ML PO SUER: 5.0000 mL | Freq: Every evening | ORAL | 0 refills | Status: DC | PRN

## 2018-02-28 MED ORDER — PREDNISONE 20 MG PO TABS
60.0000 mg | ORAL_TABLET | Freq: Once | ORAL | Status: AC
  Administered 2018-02-28: 60 mg via ORAL
  Filled 2018-02-28: qty 3

## 2018-02-28 MED ORDER — SODIUM CHLORIDE 0.9 % IV BOLUS
500.0000 mL | Freq: Once | INTRAVENOUS | Status: AC
  Administered 2018-02-28: 500 mL via INTRAVENOUS

## 2018-02-28 MED ORDER — IPRATROPIUM-ALBUTEROL 0.5-2.5 (3) MG/3ML IN SOLN
3.0000 mL | Freq: Once | RESPIRATORY_TRACT | Status: AC
  Administered 2018-02-28: 3 mL via RESPIRATORY_TRACT
  Filled 2018-02-28: qty 3

## 2018-02-28 MED ORDER — PREDNISONE 20 MG PO TABS: 40.0000 mg | ORAL_TABLET | Freq: Every day | ORAL | 0 refills | Status: AC

## 2018-02-28 MED ORDER — HYDROCOD POLST-CPM POLST ER 10-8 MG/5ML PO SUER
5.0000 mL | Freq: Once | ORAL | Status: AC
  Administered 2018-02-28: 5 mL via ORAL
  Filled 2018-02-28: qty 5

## 2018-02-28 MED ORDER — IOHEXOL 350 MG/ML SOLN
75.0000 mL | Freq: Once | INTRAVENOUS | Status: AC | PRN
  Administered 2018-02-28: 75 mL via INTRAVENOUS
  Filled 2018-02-28: qty 75

## 2018-02-28 MED ORDER — LEVOFLOXACIN 500 MG PO TABS: 250.0000 mg | ORAL_TABLET | Freq: Every day | ORAL | 0 refills | Status: AC

## 2018-02-28 MED ORDER — ALBUTEROL SULFATE HFA 108 (90 BASE) MCG/ACT IN AERS: 2.0000 | INHALATION_SPRAY | Freq: Four times a day (QID) | RESPIRATORY_TRACT | 2 refills | Status: AC | PRN

## 2018-02-28 MED ORDER — PROMETHAZINE HCL 25 MG PO TABS
12.5000 mg | ORAL_TABLET | Freq: Once | ORAL | Status: AC
  Administered 2018-02-28: 12.5 mg via ORAL
  Filled 2018-02-28: qty 1

## 2018-02-28 NOTE — ED Provider Notes (Signed)
[facility] Emergency Department Provider Note    First MD Initiated Contact with Patient 02/28/18 2000     (approximate)  I have reviewed the triage vital signs and the nursing notes.   HISTORY  Chief Complaint Cough    HPI Cassidy Mathis is a 43 y.o. female presents to the ER for persistent cough after recently being diagnosed with pneumonia.  She does smoke.  States that she is particular having difficulty getting any sleep due to recurrent cough and is having several episodes of posttussive emesis.   This that she is been sick since the holidays.  Was recently started on azithromycin as well without much improvement.  Has been having some nausea with this as well.   Past Medical History:  Diagnosis Date  . Allergy    seasonal  . Anemia   . Cancer ([place])   . Depression   . GERD (gastroesophageal reflux disease)    RARE-NO MEDS  . Headache    MIGRAINES  . Hypertension    PT STATES SHE IS SUPPOSED TO BE TAKING LISINOPRIL-HCTZ BUT HAS BEEN OUT "FOR A WHILE" NEEDS TO GET ANOTHER PRESCRIPTION FROM HER PCP  . Pancreatitis   . Pilonidal cyst    TOOK ANTIBIOTIC THIS MONTH AND IT IS RESOLVED   Family History  Problem Relation Age of Onset  . Hypertension Mother   . Gallstones Mother   . Hypertension Father   . Gout Father   . Cancer Paternal Uncle        lung  . Cancer Maternal Grandmother        not sure what type of cancer  . Cancer Maternal Grandfather        not sure what type of cancer  . Cancer Paternal Grandmother        not sure what type of cancer  . Cancer Paternal Grandfather        not sure what type of cancer   Past Surgical History:  Procedure Laterality Date  . ABDOMINAL HYSTERECTOMY    . CERVICAL CONIZATION W/BX N/A 03/23/2017   Procedure: CONIZATION CERVIX WITH BIOPSY;  Surgeon: Gillis Ends, MD;  Location: [facility] ORS;  Service: Gynecology;  Laterality: N/A;  . CHOLECYSTECTOMY  10/21/2015   Procedure:  LAPAROSCOPIC CHOLECYSTECTOMY WITH INTRAOPERATIVE CHOLANGIOGRAM;  Surgeon: Jules Husbands, MD;  Location: [facility] ORS;  Service: General;;  . CHOLECYSTECTOMY Bilateral   . CYST EXCISION     tongue AND WRIST  . EPIGASTRIC HERNIA REPAIR N/A 04/18/2015   Procedure: HERNIA REPAIR EPIGASTRIC ADULT;  Surgeon: Jules Husbands, MD;  Location: [facility] ORS;  Service: General;  Laterality: N/A;  . ERCP N/A 12/21/2016   Procedure: ENDOSCOPIC RETROGRADE CHOLANGIOPANCREATOGRAPHY (ERCP);  Surgeon: [name], MD;  Location: [facility] ENDOSCOPY;  Service: Endoscopy;  Laterality: N/A;  . HERNIA REPAIR    . RADICAL HYSTERECTOMY  04/14/2017  . SALPINGECTOMY Bilateral 04/14/2017  . SENTINEL LYMPH NODE BIOPSY    . TUBAL LIGATION     Patient Active Problem List   Diagnosis Date Noted  . Cervical cancer, FIGO stage IB1 ([place]) 05/17/2017  . Ileus ([place]) 04/18/2017  . Postoperative anemia due to acute blood loss 04/18/2017  . Tobacco use 04/06/2017  . 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 Common bile duct stone   . Pancreatitis 12/16/2016  . 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 Incarcerated epigastric hernia       Prior to Admission medications  Medication Sig Start Date End Date Taking? Authorizing Provider  albuterol (PROVENTIL HFA;VENTOLIN HFA) 108 (90 Base) MCG/ACT inhaler Inhale 2 puffs into the lungs every 6 (six) hours as needed for wheezing or shortness of breath. 02/28/18   Merlyn Lot, MD  amLODipine (NORVASC) 5 MG tablet Take 1 tablet (5 mg total) by mouth daily after lunch. 11/16/17   Hubbard Hartshorn, FNP  azithromycin (ZITHROMAX Z-PAK) 250 MG tablet Take 2 tablets (500 mg) on  Day 1,  followed by 1 tablet (250 mg) once daily on Days 2 through 5. 02/23/18 02/28/18  Sable Feil, PA-C  azithromycin (ZITHROMAX) 250 MG tablet TK 2 TAS PO ON DAY 1 THEN TK 1 T PO QD UNTIL FINISHED 01/01/18   [provider]  benzonatate (TESSALON PERLES) 100 MG capsule Take 2  capsules (200 mg total) by mouth 3 (three) times daily as needed. 02/23/18 02/23/19  Sable Feil, PA-C  brompheniramine-pseudoephedrine-DM 30-2-10 MG/5ML syrup TK 10 ML PO NIGHTLY PRN 01/01/18   [provider]  budesonide-formoterol (SYMBICORT) 80-4.5 MCG/ACT inhaler Inhale 2 puffs into the lungs 2 (two) times daily. Patient not taking: Reported on 02/22/2018 11/16/17   Hubbard Hartshorn, FNP  chlorpheniramine-HYDROcodone [facility] PENNKINETIC ER) 10-8 MG/5ML SUER Take 5 mLs by mouth at bedtime as needed for cough. 02/28/18   Merlyn Lot, MD  clonazePAM (KLONOPIN) 0.5 MG tablet Take 0.5 mg by mouth 2 (two) times daily as needed for anxiety.    [provider]  clotrimazole (LOTRIMIN) 1 % cream APP EXT AA BID 02/05/18   [provider]  dicyclomine (BENTYL) 10 MG capsule Take 1 capsule (10 mg total) by mouth 4 (four) times daily -  before meals and at bedtime. 02/22/18   Jonathon Bellows, MD  famotidine (PEPCID) 40 MG tablet Take 1 tablet (40 mg total) by mouth daily. Patient not taking: Reported on 02/22/2018 01/04/18 04/05/18  Jonathon Bellows, MD  fexofenadine-pseudoephedrine (ALLEGRA-D) 60-120 MG 12 hr tablet Take 1 tablet by mouth 2 (two) times daily. 02/23/18   Sable Feil, PA-C  levofloxacin (LEVAQUIN) 500 MG tablet Take 0.5 tablets (250 mg total) by mouth daily for 5 days. 02/28/18 03/05/18  Merlyn Lot, MD  lisinopril (PRINIVIL,ZESTRIL) 20 MG tablet Take 1 tablet (20 mg total) by mouth daily. 11/16/17   Hubbard Hartshorn, FNP  predniSONE (DELTASONE) 20 MG tablet TK 3 T PO  QD 01/01/18   [provider]  predniSONE (DELTASONE) 20 MG tablet Take 2 tablets (40 mg total) by mouth daily for 5 days. 02/28/18 03/05/18  Merlyn Lot, MD  PROAIR HFA 108 [phone] Base) MCG/ACT inhaler INHALE 1 PUFFS Q 4 TO 6 HOURS PRN 01/01/18   [provider]    Allergies Patient has no known allergies.    Social History Social History   Tobacco Use  . Smoking status: Current  Every Day Smoker    Packs/day: 0.50    Years: 12.00    Pack years: 6.00    Types: Cigarettes  . Smokeless tobacco: Never Used  Substance Use Topics  . Alcohol use: No    Alcohol/week: 0.0 standard drinks    Frequency: Never  . Drug use: Yes    Types: Marijuana    Comment: "not often"    Review of Systems Patient denies headaches, rhinorrhea, blurry vision, numbness, shortness of breath, chest pain, edema, cough, abdominal pain, nausea, vomiting, diarrhea, dysuria, fevers, rashes or hallucinations unless otherwise stated above in HPI. ____________________________________________   PHYSICAL EXAM:  VITAL SIGNS: Vitals:   02/28/18 1911  BP: (!) 150/96  Pulse: 95  Resp: 18  Temp: 98.7 F (37.1 C)  SpO2: 97%    Constitutional: Alert and oriented.  Eyes: Conjunctivae are normal.  Head: Atraumatic. Nose: No congestion/rhinnorhea. Mouth/Throat: Mucous membranes are moist.   Neck: No stridor. Painless ROM.  Cardiovascular: Normal rate, regular rhythm. Grossly normal heart sounds.  Good peripheral circulation. Respiratory: Normal respiratory effort.  No retractions. Lungs with rhonchi in right lung fields Gastrointestinal: Soft and nontender. No distention. No abdominal bruits. No CVA tenderness. Genitourinary:  Musculoskeletal: No lower extremity tenderness nor edema.  No joint effusions. Neurologic:  Normal speech and language. No gross focal neurologic deficits are appreciated. No facial droop Skin:  Skin is warm, dry and intact. No rash noted. Psychiatric: Mood and affect are normal. Speech and behavior are normal.  ____________________________________________   LABS (all labs ordered are listed, but only abnormal results are displayed)  Results for orders placed or performed during the hospital encounter of 02/28/18 (from the past 24 hour(s))  CBC with Differential/Platelet     Status: Abnormal   Collection Time: 02/28/18  8:42 PM  Result Value Ref Range   WBC 8.8  4.0 - 10.5 K/uL   RBC 4.26 3.87 - 5.11 MIL/uL   Hemoglobin 12.5 12.0 - 15.0 g/dL   HCT 37.9 36.0 - 46.0 %   MCV 89.0 80.0 - 100.0 fL   MCH 29.3 26.0 - 34.0 pg   MCHC 33.0 30.0 - 36.0 g/dL   RDW 12.5 11.5 - 15.5 %   Platelets 402 (H) 150 - 400 K/uL   nRBC 0.0 0.0 - 0.2 %   Neutrophils Relative % 48 %   Neutro Abs 4.3 1.7 - 7.7 K/uL   Lymphocytes Relative 34 %   Lymphs Abs 3.0 0.7 - 4.0 K/uL   Monocytes Relative 7 %   Monocytes Absolute 0.6 0.1 - 1.0 K/uL   Eosinophils Relative 10 %   Eosinophils Absolute 0.9 (H) 0.0 - 0.5 K/uL   Basophils Relative 1 %   Basophils Absolute 0.0 0.0 - 0.1 K/uL   Immature Granulocytes 0 %   Abs Immature Granulocytes 0.01 0.00 - 0.07 K/uL  Basic metabolic panel     Status: Abnormal   Collection Time: 02/28/18  8:42 PM  Result Value Ref Range   Sodium 139 135 - 145 mmol/L   Potassium 3.3 (L) 3.5 - 5.1 mmol/L   Chloride 106 98 - 111 mmol/L   CO2 26 22 - 32 mmol/L   Glucose, Bld 96 70 - 99 mg/dL   BUN 14 6 - 20 mg/dL   Creatinine, Ser 0.91 0.44 - 1.00 mg/dL   Calcium 9.4 8.9 - 10.3 mg/dL   GFR calc non Af Amer >60 >60 mL/min   GFR calc Af Amer >60 >60 mL/min   Anion gap 7 5 - 15   ____________________________________________ ____________________________________________  RADIOLOGY  I personally reviewed all radiographic images ordered to evaluate for the above acute complaints and reviewed radiology reports and findings.  These findings were personally discussed with the patient.  Please see medical record for radiology report.  ____________________________________________   PROCEDURES  Procedure(s) performed:  Procedures    Critical Care performed: no ____________________________________________   INITIAL IMPRESSION / ASSESSMENT AND PLAN / ED COURSE  Pertinent labs & imaging results that were available during my care of the patient were reviewed by me and considered in my medical decision making (see chart for details).  DDX:  Pneumonia, bronchitis, mass, PE, pulmonary infarct, postobstructive pneumonia  Cassidy Mathis is a 43 y.o. who presents to the ED with symptoms as described above.  Patient's been having ongoing cough congestion symptoms as described above for the past several weeks.  Has been treated since the holidays for bronchitis without any improvement.  X-ray does show persistent probable right middle lobe pneumonia or consolidation but the patient is currently afebrile and has completed course of antibiotics.  Given nebulizer with improvement in her cough will give steroids however given the duration of her symptoms with no PCP I do feel that CT imaging of her chest is indicated to further evaluate for mass or pulmonary infarct which would cause persistent symptoms.  Clinical Course as of Feb 28 2202  Tue Feb 28, 2018  2156 My review does show that there is lobar collapse in the right middle lung.  I do not see any evidence of saddle emboli.  Assuming radiology reports there is no evidence of obstructive pathology, mass or PE the patient will be discharged home on another course of antibiotics nebulizers as well as steroids.   [PR]    Clinical Course User Index [PR] Merlyn Lot, MD     As part of my medical decision making, I reviewed the following data within the [place] notes reviewed and incorporated, Labs reviewed, notes from prior ED visits and [place] Controlled Substance Database   ____________________________________________   FINAL CLINICAL IMPRESSION(S) / ED DIAGNOSES  Final diagnoses:  Community acquired pneumonia of right middle lobe of lung ([place])  Cough      NEW MEDICATIONS STARTED DURING THIS VISIT:  New Prescriptions   ALBUTEROL (PROVENTIL HFA;VENTOLIN HFA) 108 (90 BASE) MCG/ACT INHALER    Inhale 2 puffs into the lungs every 6 (six) hours as needed for wheezing or shortness of breath.   CHLORPHENIRAMINE-HYDROCODONE (TUSSIONEX PENNKINETIC ER) 10-8  MG/5ML SUER    Take 5 mLs by mouth at bedtime as needed for cough.   LEVOFLOXACIN (LEVAQUIN) 500 MG TABLET    Take 0.5 tablets (250 mg total) by mouth daily for 5 days.   PREDNISONE (DELTASONE) 20 MG TABLET    Take 2 tablets (40 mg total) by mouth daily for 5 days.     Note:  This document was prepared using Dragon voice recognition software and may include unintentional dictation errors.    Merlyn Lot, MD 02/28/18 2204

## 2018-02-28 NOTE — ED Triage Notes (Signed)
Pt reports she was diagnosed with pneumonia last week here in ED and is back today due to continued cough. Pt denies SOB. Sts, " I can still here a rattle in my chest."

## 2018-02-28 NOTE — ED Provider Notes (Signed)
Signout from Dr. Quentin Cornwall to f/u with ct angio to determine if pneumonia vs mass/pe.    Physical Exam  BP (!) 150/96 (BP Location: Right Arm)   Pulse 95   Temp 98.7 F (37.1 C) (Oral)   Resp 18   LMP 03/18/2017 (Exact Date)   SpO2 97%   Physical Exam Pt without resp distress.  Speaks in full sentences ED Course/Procedures   Clinical Course as of Mar 01 2227  Tue Feb 28, 2018  2156 My review does show that there is lobar collapse in the right middle lung.  I do not see any evidence of saddle emboli.  Assuming radiology reports there is no evidence of obstructive pathology, mass or PE the patient will be discharged home on another course of antibiotics nebulizers as well as steroids.   [PR]    Clinical Course User Index [PR] Merlyn Lot, MD    Procedures  MDM  CT a/p with right sided as well as lingular pna.  Reassuring vital signs.  Will add incentive spirometry 2/2 mucous plugging.        Orbie Pyo, MD 02/28/18 2230

## 2018-03-08 ENCOUNTER — Inpatient Hospital Stay: Payer: Medicaid Other

## 2018-03-29 ENCOUNTER — Encounter: Payer: Self-pay | Admitting: Gastroenterology

## 2018-03-29 ENCOUNTER — Ambulatory Visit: Payer: Medicaid Other | Admitting: Gastroenterology

## 2018-03-29 DIAGNOSIS — R197 Diarrhea, unspecified: Secondary | ICD-10-CM

## 2018-04-03 ENCOUNTER — Telehealth: Payer: Self-pay | Admitting: Family Medicine

## 2018-04-03 NOTE — Telephone Encounter (Addendum)
-----   Message from Hubbard Hartshorn, FNP sent at 01/05/2018  9:28 AM EST ----- Regarding: Please call patient to come in for repeat labs (CK and CMP - already placed as future) per Dr. Georgeann Oppenheim recommendation.

## 2018-04-03 NOTE — Telephone Encounter (Signed)
Pt informed and she will come in either Wednesday or next Monday

## 2018-04-05 ENCOUNTER — Inpatient Hospital Stay: Payer: Medicaid Other

## 2018-04-05 NOTE — Progress Notes (Deleted)
Gynecologic Oncology Interval Visit   Referring Provider: Dr Ouida Sills  Chief Concern: Stage IB1 cervical cancer, surveillance visit  Subjective:  Cassidy Mathis is a 43 y.o. G51P4 female with  Stage IB1 squamous cell cervical carcinoma, well-differentiated s/p radical hysterectomy, bilateral salpingectomy, with bilateral pelvic sentinel lymph node mapping, left pelvic sentinel node biopsies x 2, right pelvic lymph node dissection on 04/14/2017.   She was last seen 09/14/2017 with a negative exam.   ***She is a smoker.    Gynecologic Oncology History:  Cassidy Mathis is a pleasant G4P3 female with  Stage IB1 squamous cell cervical carcinoma, well-differentiated s/p radical hysterectomy, bilateral salpingectomy, with bilateral pelvic sentinel lymph node mapping, left pelvic sentinel node biopsies x 2, right pelvic lymph node dissection on 04/14/2017. See prior notes for complete details.   - 12/18 PAP ASCUS, + HR HPV. - 03/03/17 Colposcopy with Dr Melven Sartorius  and Bxs from ectocervix at 73, 13 and ECC well diff squamous cell cancer.    - 03/23/17- exam under anesthesia, cold knife cone, and endocervical curettage with Dr. Theora Gianotti at [facility]. Pathology:  A. CERVIX; COLD KNIFE CONE EXCISION: - SQUAMOUS CELL CARCINOMA, KERATINIZING TYPE, DEPTH OF INVASION 9 MM. - INVASIVE CARCINOMA BROADLY INVOLVES THE ECTOCERVICAL MARGIN.  B. ENDOCERVIX; CURETTAGE: - SQUAMOUS CELL CARCINOMA, ONE 0.5 MM DETACHED FRAGMENT. - SCANT BENIGN ENDOCERVICAL AND ENDOMETRIAL TISSUE.  03/31/17- Post-operative cervical infection treated with antibiotics  04/12/2017 Preop CT scan  IMPRESSION: No evidence for metastatic disease within the abdomen or pelvis.  04/14/2017- Underwent radical hysterectomy, bilateral salpingectomy, with bilateral pelvic sentinel lymph node mapping, left pelvic sentinel node biopsies x 2, right pelvic lymph node dissection, cystoscopy with Dr. Theora Gianotti at [facility] for FIGO stage 1B1 disease.    Pathology:  Invasive squamous cell carcinoma, well-differentiated. Tumor measures 4.5 mm in depth and up to 16 mm in lateral extent.  Tumor involves the inner third of the cervical stroma (4.5 mm of 20 mm thickness).  No lymphovascular invasion identified.  The margins are uninvolved. Left SLN 0/2;  Right nodes 0/6; Left parametrium: 1 lymph node, negative for tumor. Right parametrium: 1 lymph node, negative for tumor  05/02/2017- Case presented at [facility] Multidisciplinary Tumor Board. Recommendation was observation as she does not meet [place] Criteria. Continue pap screening per ASCCP Guidelines.   Post operatively there were initially concerns for infection due to fever, chills, and night sweats. Exam revealed vaginal cuff dehiscence with granulation tissue above defect. She was started on metronidazole 500mg  BID x 14 days. Otherwise she was managed with conservative therapy and provided with vaginal precautions.   She has been NED since.  Problem List: Patient Active Problem List   Diagnosis Date Noted  . Cervical cancer, FIGO stage IB1 ([place]) 05/17/2017  . Ileus ([place]) 04/18/2017  . Postoperative anemia due to acute blood loss 04/18/2017  . Tobacco use 04/06/2017  . 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 Common bile duct stone   . Pancreatitis 12/16/2016  . 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 Incarcerated epigastric hernia     Past Medical History: Past Medical History:  Diagnosis Date  . Allergy    seasonal  . Anemia   . Cancer ([place])   . Depression   . GERD (gastroesophageal reflux disease)    RARE-NO MEDS  . Headache    MIGRAINES  . Hypertension    PT STATES SHE IS SUPPOSED TO BE TAKING LISINOPRIL-HCTZ BUT HAS BEEN OUT "FOR A WHILE"  NEEDS TO GET ANOTHER PRESCRIPTION FROM HER PCP  . Pancreatitis   . Pilonidal cyst    TOOK ANTIBIOTIC THIS MONTH AND IT IS RESOLVED    Past Surgical History: Past Surgical History:  Procedure  Laterality Date  . ABDOMINAL HYSTERECTOMY    . CERVICAL CONIZATION W/BX N/A 03/23/2017   Procedure: CONIZATION CERVIX WITH BIOPSY;  Surgeon: Gillis Ends, MD;  Location: [facility] ORS;  Service: Gynecology;  Laterality: N/A;  . CHOLECYSTECTOMY  10/21/2015   Procedure: LAPAROSCOPIC CHOLECYSTECTOMY WITH INTRAOPERATIVE CHOLANGIOGRAM;  Surgeon: Jules Husbands, MD;  Location: [facility] ORS;  Service: General;;  . CHOLECYSTECTOMY Bilateral   . CYST EXCISION     tongue AND WRIST  . EPIGASTRIC HERNIA REPAIR N/A 04/18/2015   Procedure: HERNIA REPAIR EPIGASTRIC ADULT;  Surgeon: Jules Husbands, MD;  Location: [facility] ORS;  Service: General;  Laterality: N/A;  . ERCP N/A 12/21/2016   Procedure: ENDOSCOPIC RETROGRADE CHOLANGIOPANCREATOGRAPHY (ERCP);  Surgeon: [name], MD;  Location: [facility] ENDOSCOPY;  Service: Endoscopy;  Laterality: N/A;  . HERNIA REPAIR    . RADICAL HYSTERECTOMY  04/14/2017  . SALPINGECTOMY Bilateral 04/14/2017  . SENTINEL LYMPH NODE BIOPSY    . TUBAL LIGATION      Family History: Family History  Problem Relation Age of Onset  . Hypertension Mother   . Gallstones Mother   . Hypertension Father   . Gout Father   . Cancer Paternal Uncle        lung  . Cancer Maternal Grandmother        not sure what type of cancer  . Cancer Maternal Grandfather        not sure what type of cancer  . Cancer Paternal Grandmother        not sure what type of cancer  . Cancer Paternal Grandfather        not sure what type of cancer    Social History: Social History   Socioeconomic History  . Marital status: Single    Spouse name: Not on file  . Number of children: 3  . Years of education: Not on file  . Highest education level: Not on file  Occupational History  . Not on file  Social Needs  . Financial resource strain: Not hard at all  . Food insecurity:    Worry: Never true    Inability: Never true  . Transportation needs:    Medical: No    Non-medical: No  Tobacco Use  .  Smoking status: Current Every Day Smoker    Packs/day: 0.50    Years: 12.00    Pack years: 6.00    Types: Cigarettes  . Smokeless tobacco: Never Used  Substance and Sexual Activity  . Alcohol use: No    Alcohol/week: 0.0 standard drinks    Frequency: Never  . Drug use: Yes    Types: Marijuana    Comment: "not often"  . Sexual activity: Yes    Partners: Male  Lifestyle  . Physical activity:    Days per week: 0 days    Minutes per session: 0 min  . Stress: Only a little  Relationships  . Social connections:    Talks on phone: More than three times a week    Gets together: More than three times a week    Attends religious service: Never    Active member of club or organization: No    Attends meetings of clubs or organizations: Not on file    Relationship status: Never  married  . Intimate partner violence:    Fear of current or ex partner: No    Emotionally abused: No    Physically abused: No    Forced sexual activity: No  Other Topics Concern  . Not on file  Social History Narrative  . Not on file    Allergies: No Known Allergies  Current Medications: Current Outpatient Medications  Medication Sig Dispense Refill  . albuterol (PROVENTIL HFA;VENTOLIN HFA) 108 (90 Base) MCG/ACT inhaler Inhale 2 puffs into the lungs every 6 (six) hours as needed for wheezing or shortness of breath. 1 Inhaler 2  . amLODipine (NORVASC) 5 MG tablet Take 1 tablet (5 mg total) by mouth daily after lunch. 90 tablet 1  . azithromycin (ZITHROMAX) 250 MG tablet TK 2 TAS PO ON DAY 1 THEN TK 1 T PO QD UNTIL FINISHED  0  . benzonatate (TESSALON PERLES) 100 MG capsule Take 2 capsules (200 mg total) by mouth 3 (three) times daily as needed. 30 capsule 0  . brompheniramine-pseudoephedrine-DM 30-2-10 MG/5ML syrup TK 10 ML PO NIGHTLY PRN  0  . budesonide-formoterol (SYMBICORT) 80-4.5 MCG/ACT inhaler Inhale 2 puffs into the lungs 2 (two) times daily. (Patient not taking: Reported on 02/22/2018) 1 Inhaler 3   . chlorpheniramine-HYDROcodone (TUSSIONEX PENNKINETIC ER) 10-8 MG/5ML SUER Take 5 mLs by mouth at bedtime as needed for cough. 70 mL 0  . clonazePAM (KLONOPIN) 0.5 MG tablet Take 0.5 mg by mouth 2 (two) times daily as needed for anxiety.    . clotrimazole (LOTRIMIN) 1 % cream APP EXT AA BID    . dicyclomine (BENTYL) 10 MG capsule Take 1 capsule (10 mg total) by mouth 4 (four) times daily -  before meals and at bedtime. 90 capsule 0  . famotidine (PEPCID) 40 MG tablet Take 1 tablet (40 mg total) by mouth daily. (Patient not taking: Reported on 02/22/2018) 90 tablet 1  . fexofenadine-pseudoephedrine (ALLEGRA-D) 60-120 MG 12 hr tablet Take 1 tablet by mouth 2 (two) times daily. 20 tablet 0  . lisinopril (PRINIVIL,ZESTRIL) 20 MG tablet Take 1 tablet (20 mg total) by mouth daily. 90 tablet 1  . predniSONE (DELTASONE) 20 MG tablet TK 3 T PO  QD  0  . PROAIR HFA 108 (90 Base) MCG/ACT inhaler INHALE 1 PUFFS Q 4 TO 6 HOURS PRN  0   No current facility-administered medications for this visit.    ***Review of Systems General: No complaints Skin: No complaints Eyes: No complaints HEENT: No complaints Breasts: No complaints Pulmonary: No complaints Cardiac: No complaints GI: Abdominal bloating, pain GU/Sexual: urinary frequency GYN: No complaints MSK: No complaints Hematology: No complaints Neuro/Psych: No complaints  Objective:  Physical Examination:  LMP 03/18/2017 (Exact Date)    ECOG Performance Status: 1 - Symptomatic but completely ambulatory  General appearance: alert, cooperative and appears stated age HEENT:  Sclerae anicteric.  PERRL and thyroid without masses. Neck is supple.  NODES:  No cervical, supraclavicular, or axillary lymphadenopathy palpated.  Cardiovascular: Regular rate and rhythm Respiratory: Clear to auscultation bilaterally.  No wheezes or rhonchi. Abdomen: Soft, nontender. No hernias and surgical incisions are well healed. Incision all well healed. No hernias, no  masses, no ascites.  Extremities:No edema Skin exam - Well healed incisions.  Neurological exam reveals alert, oriented, normal speech, no focal findings or movement disorder noted  Pelvic: exam chaperoned by NP Vulva: normal appearing vulva with no masses, tenderness or lesions; Vagina: normal on BME thickness at the left fornix but no nodularity  or masses; Adnexa: negative for masses or nodularity; Uterus and Cervix: surgically absent; Rectal: deferred     Assessment:  Khayla Koppenhaver Denio is a 43 y.o. 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 Low risk pathology factors, so no adjuvant therapy recommended.   Vaginal cuff defect completely healed.   ***Urinary frequency, assess for UTI.   Hypertension, poorly controlled and symptomatic with headaches.   Medical co-morbidities complicating care: smokes half pack day, There is no height or weight on file to calculate BMI.   Plan:   Problem List Items Addressed This Visit    None     We reviewed her prognosis. According to UpToDate risk of recurrence for stage IB is 10%. I have recommended continued close follow up with exams, including pelvic exams every 3-6 months for 2 years, then every 6-12 months for 3-5 years and then annually thereafter. Cervical/vaginal cytology may be considered annually, but is thought to have limited value for detecting recurrent cervical cancer. Imaging and laboratory assessment is based on clinical indication. Patient education for obesity, lifestyle, exercise, nutrition, smoking cessation, sexual health, vaginal dilator use, vaginal lubricants/moisturizers. We also discussed bladder function issues after surgery.   We discussed smoking cessation and our team will connect her with Melinda Crutch  about Quit Smart smoking cessation program.    ***She will see Dr. Ouida Sills in 3 months (we provided the contact information but she will need to make the appointment) and return for repeat exam in 6 months in our clinic.   She was provided referral to primary care for management of HTN. We also recommended screening for diabetes. GIven her DBP of 111 and symptoms of headaches we will have her see the [facility] today. She declined due to transportation issues. She will refill her prescription and take her second BP medication today. We requested she take her BPs and contact us with results. Precautions given and she is to contact our symptom management clinic or ER if she has concerning symptoms.     A total of at least 25 minutes were spent with the patient/family today; >50% was spent in education, counseling and coordination of care for cervical cancer.  Rosie Torrez Gaetana Michaelis, MD  CC:  Dr Ouida Sills

## 2018-04-18 ENCOUNTER — Telehealth: Payer: Self-pay | Admitting: Nurse Practitioner

## 2018-04-18 NOTE — Telephone Encounter (Signed)
Called patient to advise that in response to COVID-19 Pandemic and to ensure the continuous provision of safe, quality care for our patients, communities, staff, and providers, The [facility] On[facility] Service line has advised [place] to temporarily alter scheduling and appointments of our patients. We discussed that patient is currently being seen for surveillance of cervical cancer.   We discussed specific symptoms that would warrant returning to clinic sooner and patient advised that she is currently asymptomatic, feels at baseline, and displays none of these symptoms. Given this, Dr. Fransisca Connors and myself recommended rescheduling for approximately 6 weeks which patient was agreeable with.   Patient is not on active treatment for an oncology diagnosis and therefore, I recommended rescheduling for approximately 6  weeks.   We also discussed general [facility] recommendations for identifying COVID-19 risk and what to do if low risk such as handwashing, social distancing, coughing or sneezing into shirt or elbow, and staying at home. Advised patient that if she develops fever, cough, shortness of breath, to contact their healthcare provider before going to clinic and as always contact 911 for medical emergencies.   I will send scheduling message to have schedulers contact patient to reschedule patient to/around 05/31/2018.

## 2018-04-19 ENCOUNTER — Inpatient Hospital Stay: Payer: Medicaid Other

## 2018-05-30 ENCOUNTER — Other Ambulatory Visit: Payer: Self-pay

## 2018-05-31 ENCOUNTER — Other Ambulatory Visit: Payer: Self-pay

## 2018-05-31 ENCOUNTER — Encounter: Payer: Self-pay | Admitting: Obstetrics and Gynecology

## 2018-05-31 ENCOUNTER — Inpatient Hospital Stay: Payer: Medicaid Other | Attending: Obstetrics and Gynecology | Admitting: Obstetrics and Gynecology

## 2018-05-31 DIAGNOSIS — C539 Malignant neoplasm of cervix uteri, unspecified: Secondary | ICD-10-CM | POA: Diagnosis not present

## 2018-05-31 DIAGNOSIS — Z720 Tobacco use: Secondary | ICD-10-CM

## 2018-05-31 NOTE — Progress Notes (Signed)
Gynecologic Oncology Interval Visit  Virtual Visit via Telephone Note  Referring Provider: Dr Ouida Sills  Chief Concern: Stage IB1 cervical cancer, surveillance visit  I connected with Ivyonna Meloy on 05/31/18 at 9:45 AM EST by video enabled telemedicine visit due to [place] pandemic, and verified that I am speaking with the correct person using two identifiers.   I discussed the limitations, risks, security and privacy concerns of performing an evaluation and management service by telemedicine and the availability of in-person appointments. I also discussed with the patient that there may be a patient responsible charge related to this service. The patient expressed understanding and agreed to proceed.   Other persons participating in the visit and their role in the encounter: Mellody Drown, MD, Beckey Rutter, NP, Mariea Clonts (RN), and Cathie Beams (patient)  Patient's location: home  Provider's location: home ([place]) & clinic [facility])  Chief Complaint: Stage IB1 squamous cell cervical carcinoma  Patient agreed to evaluation by telephone/telemedicine to discuss follow-up for cervical cancer.   Subjective:  EMMORY SOLIVAN is a 43 y.o. G53P4 female with  Stage IB1 squamous cell cervical carcinoma, well-differentiated s/p radical hysterectomy, bilateral salpingectomy, with bilateral pelvic sentinel lymph node mapping, left pelvic sentinel node biopsies x 2, right pelvic lymph node dissection on 04/14/2017.  Still has urinary frequency and urgency.  Drinks a lot of tea.  Had bronchitis in February with congestion and had CXR 02/23/18 and 2/4 that showed lingular pneumonia and treated with antibiotics. Now using Albuterol and steroid inhalers.  Still has a little congestion and dyspnea. No fever, or chest pain. She is a smoker (<1/2 PPD) and trying to quit. Has been followed at [facility]. She has been staying at home with her teen age children and none of them  got sick.  No pelvic pain, bleeding, discharge or leg swelling. No GI or GU complaints.   She is taking her blood pressure medicine, but does not have a cuff at home to check. Occasional headache.  Gynecologic Oncology History:  Angelize Ryce Metheney is a pleasant G4P3 female with  Stage IB1 squamous cell cervical carcinoma, well-differentiated s/p radical hysterectomy, bilateral salpingectomy, with bilateral pelvic sentinel lymph node mapping, left pelvic sentinel node biopsies x 2, right pelvic lymph node dissection on 04/14/2017. See prior notes for complete details.   - 12/18 PAP ASCUS, + HR HPV. - 03/03/17 Colposcopy with Dr Melven Sartorius  and Bxs from ectocervix at 74, 13 and ECC well diff squamous cell cancer.    - 03/23/17- exam under anesthesia, cold knife cone, and endocervical curettage with Dr. Theora Gianotti at [facility]. Pathology:  A. CERVIX; COLD KNIFE CONE EXCISION: - SQUAMOUS CELL CARCINOMA, KERATINIZING TYPE, DEPTH OF INVASION 9 MM. - INVASIVE CARCINOMA BROADLY INVOLVES THE ECTOCERVICAL MARGIN.  B. ENDOCERVIX; CURETTAGE: - SQUAMOUS CELL CARCINOMA, ONE 0.5 MM DETACHED FRAGMENT. - SCANT BENIGN ENDOCERVICAL AND ENDOMETRIAL TISSUE.  03/31/17- Post-operative cervical infection treated with antibiotics  04/12/2017 Preop CT scan  IMPRESSION: No evidence for metastatic disease within the abdomen or pelvis.  04/14/2017- Underwent radical hysterectomy, bilateral salpingectomy, with bilateral pelvic sentinel lymph node mapping, left pelvic sentinel node biopsies x 2, right pelvic lymph node dissection, cystoscopy with Dr. Theora Gianotti at [facility] for FIGO stage 1B1 disease.   Pathology:  Invasive squamous cell carcinoma, well-differentiated. Tumor measures 4.5 mm in depth and up to 16 mm in lateral extent.  Tumor involves the inner third of the cervical stroma (4.5 mm of 20 mm thickness).  No lymphovascular invasion identified.  The margins are uninvolved. Left SLN 0/2;  Right nodes 0/6; Left parametrium: 1 lymph  node, negative for tumor. Right parametrium: 1 lymph node, negative for tumor  05/02/2017- Case presented at [facility] Multidisciplinary Tumor Board. Recommendation was observation as she does not meet [place] Criteria. Continue pap screening per ASCCP Guidelines.   Post operatively there were initially concerns for infection due to fever, chills, and night sweats. Exam revealed vaginal cuff dehiscence with granulation tissue above defect. She was started on metronidazole 500mg  BID x 14 days. Otherwise she was managed with conservative therapy and provided with vaginal precautions. Cuff eventually healed well.  She has been NED since.  Problem List: Patient Active Problem List   Diagnosis Date Noted  . Cervical cancer, FIGO stage IB1 ([place]) 05/17/2017  . Ileus ([place]) 04/18/2017  . Postoperative anemia due to acute blood loss 04/18/2017  . Tobacco use 04/06/2017  . 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 Common bile duct stone   . Pancreatitis 12/16/2016  . 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 Incarcerated epigastric hernia     Past Medical History: Past Medical History:  Diagnosis Date  . Allergy    seasonal  . Anemia   . Cancer ([place])   . Depression   . GERD (gastroesophageal reflux disease)    RARE-NO MEDS  . Headache    MIGRAINES  . Hypertension    PT STATES SHE IS SUPPOSED TO BE TAKING LISINOPRIL-HCTZ BUT HAS BEEN OUT "FOR A WHILE" NEEDS TO GET ANOTHER PRESCRIPTION FROM HER PCP  . Pancreatitis   . Pilonidal cyst    TOOK ANTIBIOTIC THIS MONTH AND IT IS RESOLVED    Past Surgical History: Past Surgical History:  Procedure Laterality Date  . ABDOMINAL HYSTERECTOMY    . CERVICAL CONIZATION W/BX N/A 03/23/2017   Procedure: CONIZATION CERVIX WITH BIOPSY;  Surgeon: Gillis Ends, MD;  Location: [facility] ORS;  Service: Gynecology;  Laterality: N/A;  . CHOLECYSTECTOMY  10/21/2015   Procedure: LAPAROSCOPIC CHOLECYSTECTOMY WITH INTRAOPERATIVE  CHOLANGIOGRAM;  Surgeon: Jules Husbands, MD;  Location: [facility] ORS;  Service: General;;  . CHOLECYSTECTOMY Bilateral   . CYST EXCISION     tongue AND WRIST  . EPIGASTRIC HERNIA REPAIR N/A 04/18/2015   Procedure: HERNIA REPAIR EPIGASTRIC ADULT;  Surgeon: Jules Husbands, MD;  Location: [facility] ORS;  Service: General;  Laterality: N/A;  . ERCP N/A 12/21/2016   Procedure: ENDOSCOPIC RETROGRADE CHOLANGIOPANCREATOGRAPHY (ERCP);  Surgeon: [name], MD;  Location: [facility] ENDOSCOPY;  Service: Endoscopy;  Laterality: N/A;  . HERNIA REPAIR    . RADICAL HYSTERECTOMY  04/14/2017  . SALPINGECTOMY Bilateral 04/14/2017  . SENTINEL LYMPH NODE BIOPSY    . TUBAL LIGATION      Family History: Family History  Problem Relation Age of Onset  . Hypertension Mother   . Gallstones Mother   . Hypertension Father   . Gout Father   . Cancer Paternal Uncle        lung  . Cancer Maternal Grandmother        not sure what type of cancer  . Cancer Maternal Grandfather        not sure what type of cancer  . Cancer Paternal Grandmother        not sure what type of cancer  . Cancer Paternal Grandfather        not sure what type of cancer    Social History: Social History   Socioeconomic History  . Marital  status: Single    Spouse name: Not on file  . Number of children: 3  . Years of education: Not on file  . Highest education level: Not on file  Occupational History  . Not on file  Social Needs  . Financial resource strain: Not hard at all  . Food insecurity:    Worry: Never true    Inability: Never true  . Transportation needs:    Medical: No    Non-medical: No  Tobacco Use  . Smoking status: Current Every Day Smoker    Packs/day: 0.50    Years: 12.00    Pack years: 6.00    Types: Cigarettes  . Smokeless tobacco: Never Used  Substance and Sexual Activity  . Alcohol use: No    Alcohol/week: 0.0 standard drinks    Frequency: Never  . Drug use: Yes    Types: Marijuana    Comment: "not often"   . Sexual activity: Yes    Partners: Male  Lifestyle  . Physical activity:    Days per week: 0 days    Minutes per session: 0 min  . Stress: Only a little  Relationships  . Social connections:    Talks on phone: More than three times a week    Gets together: More than three times a week    Attends religious service: Never    Active member of club or organization: No    Attends meetings of clubs or organizations: Not on file    Relationship status: Never married  . Intimate partner violence:    Fear of current or ex partner: No    Emotionally abused: No    Physically abused: No    Forced sexual activity: No  Other Topics Concern  . Not on file  Social History Narrative  . Not on file    Allergies: No Known Allergies  Current Medications: Current Outpatient Medications  Medication Sig Dispense Refill  . albuterol (PROVENTIL HFA;VENTOLIN HFA) 108 (90 Base) MCG/ACT inhaler Inhale 2 puffs into the lungs every 6 (six) hours as needed for wheezing or shortness of breath. 1 Inhaler 2  . amLODipine (NORVASC) 5 MG tablet Take 1 tablet (5 mg total) by mouth daily after lunch. 90 tablet 1  . azithromycin (ZITHROMAX) 250 MG tablet TK 2 TAS PO ON DAY 1 THEN TK 1 T PO QD UNTIL FINISHED  0  . benzonatate (TESSALON PERLES) 100 MG capsule Take 2 capsules (200 mg total) by mouth 3 (three) times daily as needed. 30 capsule 0  . brompheniramine-pseudoephedrine-DM 30-2-10 MG/5ML syrup TK 10 ML PO NIGHTLY PRN  0  . budesonide-formoterol (SYMBICORT) 80-4.5 MCG/ACT inhaler Inhale 2 puffs into the lungs 2 (two) times daily. (Patient not taking: Reported on 02/22/2018) 1 Inhaler 3  . chlorpheniramine-HYDROcodone (TUSSIONEX PENNKINETIC ER) 10-8 MG/5ML SUER Take 5 mLs by mouth at bedtime as needed for cough. 70 mL 0  . clonazePAM (KLONOPIN) 0.5 MG tablet Take 0.5 mg by mouth 2 (two) times daily as needed for anxiety.    . clotrimazole (LOTRIMIN) 1 % cream APP EXT AA BID    . dicyclomine (BENTYL) 10 MG  capsule Take 1 capsule (10 mg total) by mouth 4 (four) times daily -  before meals and at bedtime. 90 capsule 0  . famotidine (PEPCID) 40 MG tablet Take 1 tablet (40 mg total) by mouth daily. (Patient not taking: Reported on 02/22/2018) 90 tablet 1  . fexofenadine-pseudoephedrine (ALLEGRA-D) 60-120 MG 12 hr tablet Take 1 tablet by  mouth 2 (two) times daily. 20 tablet 0  . lisinopril (PRINIVIL,ZESTRIL) 20 MG tablet Take 1 tablet (20 mg total) by mouth daily. 90 tablet 1  . predniSONE (DELTASONE) 20 MG tablet TK 3 T PO  QD  0  . PROAIR HFA 108 (90 Base) MCG/ACT inhaler INHALE 1 PUFFS Q 4 TO 6 HOURS PRN  0   No current facility-administered medications for this visit.    Review of Systems General: No complaints Skin: No complaints Eyes: No complaints HEENT: No complaints Breasts: No complaints Pulmonary: see HPI Cardiac: No complaints GI: Abdominal bloating, pain GU/Sexual: urinary frequency GYN: No complaints MSK: No complaints Hematology: No complaints Neuro/Psych: No complaints  Objective:  Physical Examination:  LMP 03/18/2017 (Exact Date)    ECOG Performance Status: 1 - Symptomatic but completely ambulatory  General appearance: alert, cooperative and appears stated age Exam not done due to televideo visit. Assessment:  SIGRID SCHWEBACH is a 43 y.o. 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 Low risk pathology factors, so no adjuvant therapy recommended. Vaginal cuff defect completely healed.   Urinary frequency and urgency, assessed for UTI and negative.  Could have bladder dysfunction due to rad hyst with residual urine.   Hypertension, poorly controlled and symptomatic with headaches. Followed by general medicine.  Pneumonia diagnosed at end of January with  persistent bronchitis and followed by medicine.  No point in COVID testing at this point, although may be worth checking antibody test when this is more available.   Medical co-morbidities complicating care: smokes half pack day, There is no height or weight on file to calculate BMI.   Plan:   Problem List Items Addressed This Visit      Other   Cervical cancer, FIGO stage IB1 ([place]) - Primary     She will RTC for exam in 1-2 months and we can check residual urine if she is still having urinary frequency and urgency. She could have bladder dysfunction due to rad hyst.   We discussed smoking cessation again.  In the past our team connected her with [facility] about [employer] smoking cessation program.  Will do this again.  She will continue management of HTN and bronchitis with her medical doctor. Precautions given and she is to contact our symptom management clinic or ER if she has concerning symptoms.   Urinalysis with reflex urine culture with speciation requested to assess for UTI.  I discussed the assessment and treatment plan with the patient. The patient was provided an opportunity to ask questions and all were answered. The patient agreed with the plan and demonstrated an understanding of the instructions.   The patient was advised to call back or seek an in-person evaluation if the symptoms worsen or if the condition fails to improve as anticipated.   I provided 25 minutes of face-to-face video visit time, during this encounter, and > 50% was spent counseling as documented under my assessment & plan.   Mellody Drown, MD  CC:  Dr Ouida Sills

## 2018-06-27 ENCOUNTER — Other Ambulatory Visit: Payer: Self-pay

## 2018-06-28 ENCOUNTER — Inpatient Hospital Stay: Payer: Medicaid Other | Attending: Obstetrics and Gynecology | Admitting: Obstetrics and Gynecology

## 2018-06-28 ENCOUNTER — Other Ambulatory Visit: Payer: Self-pay

## 2018-06-28 VITALS — BP 130/87 | HR 81 | Temp 99.2°F | Resp 20 | Ht 65.0 in | Wt 213.4 lb

## 2018-06-28 DIAGNOSIS — R3915 Urgency of urination: Secondary | ICD-10-CM | POA: Diagnosis not present

## 2018-06-28 DIAGNOSIS — Z90722 Acquired absence of ovaries, bilateral: Secondary | ICD-10-CM

## 2018-06-28 DIAGNOSIS — M549 Dorsalgia, unspecified: Secondary | ICD-10-CM

## 2018-06-28 DIAGNOSIS — F329 Major depressive disorder, single episode, unspecified: Secondary | ICD-10-CM | POA: Diagnosis not present

## 2018-06-28 DIAGNOSIS — F17210 Nicotine dependence, cigarettes, uncomplicated: Secondary | ICD-10-CM | POA: Insufficient documentation

## 2018-06-28 DIAGNOSIS — Z90710 Acquired absence of both cervix and uterus: Secondary | ICD-10-CM | POA: Diagnosis not present

## 2018-06-28 DIAGNOSIS — K219 Gastro-esophageal reflux disease without esophagitis: Secondary | ICD-10-CM | POA: Insufficient documentation

## 2018-06-28 DIAGNOSIS — Z79899 Other long term (current) drug therapy: Secondary | ICD-10-CM | POA: Diagnosis not present

## 2018-06-28 DIAGNOSIS — I10 Essential (primary) hypertension: Secondary | ICD-10-CM | POA: Insufficient documentation

## 2018-06-28 DIAGNOSIS — C539 Malignant neoplasm of cervix uteri, unspecified: Secondary | ICD-10-CM

## 2018-06-28 DIAGNOSIS — Z08 Encounter for follow-up examination after completed treatment for malignant neoplasm: Secondary | ICD-10-CM | POA: Diagnosis not present

## 2018-06-28 DIAGNOSIS — Z8541 Personal history of malignant neoplasm of cervix uteri: Secondary | ICD-10-CM | POA: Insufficient documentation

## 2018-06-28 NOTE — Progress Notes (Signed)
Gynecologic Oncology Interval Visit   Referring Provider: Dr Ouida Sills  Chief Concern: Stage IB1 cervical cancer, surveillance visit  Chief Complaint: Stage IB1 squamous cell cervical carcinoma  Subjective:  Cassidy Mathis is a 43 y.o. G77P4 female with  Stage IB1 squamous cell cervical carcinoma, well-differentiated s/p radical hysterectomy, bilateral salpingectomy, with bilateral pelvic sentinel lymph node mapping, left pelvic sentinel node biopsies x 2, right pelvic lymph node dissection on 04/14/2017, who returns to clinic for surveillance pelvic exam.   Due to covid-19 pandemic she was seen virtually by Dr. Fransisca Connors on 05/31/2018. She was complaining of urinary frequency & urgency at that time. She declined UA and discussed bladder scan to assess for residual urine. She complained of poorly controlled blood pressure and headaches at that time which is followed by general medicine. No pelvic pain, bleeding, discharge or leg swelling.   Today, she complains of right lower hip cramp that started last night, described as sharp. She says pain kept her up last night despite ibuprofen. Urinary complaints are improved since surgery but she continues to have urgency. She's been taking Denies pelvic pain, bleeding, discharge, or leg swelling. Blood pressure has been better controlled and she denies headaches. She unfortunately continues to smoke.   Gynecologic Oncology History:  Cassidy Mathis is a pleasant G4P3 female with  Stage IB1 squamous cell cervical carcinoma, well-differentiated s/p radical hysterectomy, bilateral salpingectomy, with bilateral pelvic sentinel lymph node mapping, left pelvic sentinel node biopsies x 2, right pelvic lymph node dissection on 04/14/2017. See prior notes for complete details.   - 12/18 PAP ASCUS, + HR HPV. - 03/03/17 Colposcopy with Dr Melven Sartorius  and Bxs from ectocervix at 63, 13 and ECC well diff squamous cell cancer.    - 03/23/17- exam under anesthesia,  cold knife cone, and endocervical curettage with Dr. Theora Gianotti at [facility]. Pathology:  A. CERVIX; COLD KNIFE CONE EXCISION: - SQUAMOUS CELL CARCINOMA, KERATINIZING TYPE, DEPTH OF INVASION 9 MM. - INVASIVE CARCINOMA BROADLY INVOLVES THE ECTOCERVICAL MARGIN.  B. ENDOCERVIX; CURETTAGE: - SQUAMOUS CELL CARCINOMA, ONE 0.5 MM DETACHED FRAGMENT. - SCANT BENIGN ENDOCERVICAL AND ENDOMETRIAL TISSUE.  03/31/17- Post-operative cervical infection treated with antibiotics  04/12/2017 Preop CT scan  IMPRESSION: No evidence for metastatic disease within the abdomen or pelvis.  04/14/2017- Underwent radical hysterectomy, bilateral salpingectomy, with bilateral pelvic sentinel lymph node mapping, left pelvic sentinel node biopsies x 2, right pelvic lymph node dissection, cystoscopy with Dr. Theora Gianotti at [facility] for FIGO stage 1B1 disease.   Pathology:  Invasive squamous cell carcinoma, well-differentiated. Tumor measures 4.5 mm in depth and up to 16 mm in lateral extent.  Tumor involves the inner third of the cervical stroma (4.5 mm of 20 mm thickness).  No lymphovascular invasion identified.  The margins are uninvolved. Left SLN 0/2;  Right nodes 0/6; Left parametrium: 1 lymph node, negative for tumor. Right parametrium: 1 lymph node, negative for tumor  05/02/2017- Case presented at [facility] Multidisciplinary Tumor Board. Recommendation was observation as she does not meet [place] Criteria. Continue pap screening per ASCCP Guidelines.   Post operatively there were initially concerns for infection due to fever, chills, and night sweats. Exam revealed vaginal cuff dehiscence with granulation tissue above defect. She was started on metronidazole 500mg  BID x 14 days. Otherwise she was managed with conservative therapy and provided with vaginal precautions. Cuff eventually healed well.  She has been NED since.  Complained of urinary frequency and urgency.  Drinks a lot of tea.  Had bronchitis in February  2020 with congestion and had  CXR 02/23/18 and 2/4 that showed lingular pneumonia and treated with antibiotics. She used albuterol and steroid inhalers. She is a smoker (<1/2 PPD) and trying to quit. Has been followed at [facility]. She has been staying at home with her teen age children and none of them got sick.  Problem List: Patient Active Problem List   Diagnosis Date Noted  . Cervical cancer, FIGO stage IB1 ([place]) 05/17/2017  . Ileus ([place]) 04/18/2017  . Postoperative anemia due to acute blood loss 04/18/2017  . Tobacco use 04/06/2017  . 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 Common bile duct stone   . Pancreatitis 12/16/2016  . 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 Incarcerated epigastric hernia     Past Medical History: Past Medical History:  Diagnosis Date  . Allergy    seasonal  . Anemia   . Cancer ([place])   . Depression   . GERD (gastroesophageal reflux disease)    RARE-NO MEDS  . Headache    MIGRAINES  . Hypertension    PT STATES SHE IS SUPPOSED TO BE TAKING LISINOPRIL-HCTZ BUT HAS BEEN OUT "FOR A WHILE" NEEDS TO GET ANOTHER PRESCRIPTION FROM HER PCP  . Pancreatitis   . Pilonidal cyst    TOOK ANTIBIOTIC THIS MONTH AND IT IS RESOLVED    Past Surgical History: Past Surgical History:  Procedure Laterality Date  . ABDOMINAL HYSTERECTOMY    . CERVICAL CONIZATION W/BX N/A 03/23/2017   Procedure: CONIZATION CERVIX WITH BIOPSY;  Surgeon: Gillis Ends, MD;  Location: [facility] ORS;  Service: Gynecology;  Laterality: N/A;  . CHOLECYSTECTOMY  10/21/2015   Procedure: LAPAROSCOPIC CHOLECYSTECTOMY WITH INTRAOPERATIVE CHOLANGIOGRAM;  Surgeon: Jules Husbands, MD;  Location: [facility] ORS;  Service: General;;  . CHOLECYSTECTOMY Bilateral   . CYST EXCISION     tongue AND WRIST  . EPIGASTRIC HERNIA REPAIR N/A 04/18/2015   Procedure: HERNIA REPAIR EPIGASTRIC ADULT;  Surgeon: Jules Husbands, MD;  Location: [facility] ORS;  Service: General;  Laterality: N/A;  . ERCP N/A  12/21/2016   Procedure: ENDOSCOPIC RETROGRADE CHOLANGIOPANCREATOGRAPHY (ERCP);  Surgeon: [name], MD;  Location: [facility] ENDOSCOPY;  Service: Endoscopy;  Laterality: N/A;  . HERNIA REPAIR    . RADICAL HYSTERECTOMY  04/14/2017  . SALPINGECTOMY Bilateral 04/14/2017  . SENTINEL LYMPH NODE BIOPSY    . TUBAL LIGATION      Family History: Family History  Problem Relation Age of Onset  . Hypertension Mother   . Gallstones Mother   . Hypertension Father   . Gout Father   . Cancer Paternal Uncle        lung  . Cancer Maternal Grandmother        not sure what type of cancer  . Cancer Maternal Grandfather        not sure what type of cancer  . Cancer Paternal Grandmother        not sure what type of cancer  . Cancer Paternal Grandfather        not sure what type of cancer    Social History: Social History   Socioeconomic History  . Marital status: Single    Spouse name: Not on file  . Number of children: 3  . Years of education: Not on file  . Highest education level: Not on file  Occupational History  . Not on file  Social Needs  . Financial resource strain: Not hard at all  .  Food insecurity:    Worry: Never true    Inability: Never true  . Transportation needs:    Medical: No    Non-medical: No  Tobacco Use  . Smoking status: Current Every Day Smoker    Packs/day: 0.50    Years: 12.00    Pack years: 6.00    Types: Cigarettes  . Smokeless tobacco: Never Used  Substance and Sexual Activity  . Alcohol use: No    Alcohol/week: 0.0 standard drinks    Frequency: Never  . Drug use: Yes    Types: Marijuana    Comment: "not often"  . Sexual activity: Yes    Partners: Male  Lifestyle  . Physical activity:    Days per week: 0 days    Minutes per session: 0 min  . Stress: Only a little  Relationships  . Social connections:    Talks on phone: More than three times a week    Gets together: More than three times a week    Attends religious service: Never    Active  member of club or organization: No    Attends meetings of clubs or organizations: Not on file    Relationship status: Never married  . Intimate partner violence:    Fear of current or ex partner: No    Emotionally abused: No    Physically abused: No    Forced sexual activity: No  Other Topics Concern  . Not on file  Social History Narrative  . Not on file    Allergies: No Known Allergies  Current Medications: Current Outpatient Medications  Medication Sig Dispense Refill  . albuterol (PROVENTIL HFA;VENTOLIN HFA) 108 (90 Base) MCG/ACT inhaler Inhale 2 puffs into the lungs every 6 (six) hours as needed for wheezing or shortness of breath. 1 Inhaler 2  . amLODipine (NORVASC) 5 MG tablet Take 1 tablet (5 mg total) by mouth daily after lunch. 90 tablet 1  . fexofenadine-pseudoephedrine (ALLEGRA-D) 60-120 MG 12 hr tablet Take 1 tablet by mouth 2 (two) times daily. 20 tablet 0  . lisinopril (PRINIVIL,ZESTRIL) 20 MG tablet Take 1 tablet (20 mg total) by mouth daily. 90 tablet 1   No current facility-administered medications for this visit.    Review of Systems General:  no complaints Skin: no complaints Eyes: no complaints HEENT: no complaints Breasts: no complaints Pulmonary: no complaints Cardiac: no complaints Gastrointestinal: no complaints Genitourinary/Sexual: bladder issues since surgery with urgency.  Ob/Gyn: no complaints Musculoskeletal: back/right hip pain that started yesterday Hematology: no complaints Neurologic/Psych: no complaints  Objective:  Physical Examination:  Today's Vitals   06/28/18 1100  BP: 130/87  Pulse: 81  Resp: 20  Temp: 99.2 F (37.3 C)  TempSrc: Tympanic  Weight: 213 lb 6.4 oz (96.8 kg)  Height: 5\' 5"  (1.651 m)   Body mass index is 35.51 kg/m. BP 130/87 (BP Location: Right Arm, Patient Position: Sitting)   Pulse 81   Temp 99.2 F (37.3 C) (Tympanic)   Resp 20   Ht 5\' 5"  (1.651 m)   Wt 213 lb 6.4 oz (96.8 kg)   LMP 03/18/2017  (Exact Date)   BMI 35.51 kg/m    ECOG Performance Status: 1 - Symptomatic but completely ambulatory  GENERAL: Patient is a well appearing female in no acute distress HEENT:  Sclera clear. Anicteric NODES:  Negative axillary, supraclavicular, inguinal lymph node survery LUNGS:  Clear to auscultation bilaterally.   HEART:  Regular rate and rhythm.  ABDOMEN:  Soft, nontender.  No hernias,  incisions well healed. No masses or ascites EXTREMITIES:  No peripheral edema. Atraumatic. No cyanosis SKIN:  Clear with no obvious rashes or skin changes.  NEURO:  Nonfocal. Well oriented.  Appropriate affect.  Pelvic: exam chaperoned by NP Vulva: normal appearing vulva with no masses, tenderness or lesions; Vagina: normal on exam. Pap obtained.  BME: 3 cm mass right which may represent the right ovary. Left negative for masses or nodularity; Uterus and Cervix: surgically absent; Rectal: deferred  Assessment:  Cassidy Mathis is a 43 y.o. 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 Low risk pathology factors, so no adjuvant therapy recommended. Vaginal cuff defect completely healed.   Back pain may be musculoskeletal. Given concurrent finding of possible right pelvic mass concern for recurrence.   Hypertension, better controlled.   Urinary urgency chronic, suspect due to radical hysterectomy.   Pneumonia diagnosed at end of January with persistent bronchitis and followed by medicine.  No point in COVID testing at this point, although may be worth checking antibody test when this is more available.   Medical co-morbidities complicating care: smokes half pack day, Body mass index is 35.51 kg/m.   Plan:   Problem List Items Addressed This Visit      Other   Cervical cancer, FIGO stage  IB1 ([place]) - Primary   Relevant Orders   CT Abdomen Pelvis W Contrast     Obtain CT scan to assess for recurrent disease and etiology of back pain. If negative for recurrence then RTC for exam in 3 months and if exam reassuring begin alternating with Dr Ouida Sills.    Discussed urinary issues are most likely secondary to surgery with radical hysterectomy and most likely will not improve.   I discussed the assessment and treatment plan with the patient. The patient was provided an opportunity to ask questions and all were answered. The patient agreed with the plan and demonstrated an understanding of the instructions.   The patient was advised to call back or seek an in-person evaluation if the symptoms worsen or if the condition fails to improve as anticipated.   Beckey Rutter, DNP, AGNP-C [place] at [facility] [phone] (work cell) [phone] (office)  I personally had a face to face interaction and evaluated the patient jointly with the NP, Ms. Beckey Rutter.  I have reviewed her history and available records and have performed the key portions of the physical exam including general, HEENT, lymph node survey, abdominal exam, pelvic exam with my findings confirming those documented above by the APP.  I have discussed the case with the APP and the patient.  I agree with the above documentation, assessment and plan which was fully formulated by me.  Counseling was completed by me.   I personally saw the patient and performed a substantive portion of this encounter in conjunction with the listed APP as documented above.  A total of 25 minutes were spent with the patient/family today; at least 50% was spent in education, counseling and coordination of care for cervical cancer.   Ronit Marczak Gaetana Michaelis, MD   CC:  Dr Ouida Sills

## 2018-07-07 ENCOUNTER — Other Ambulatory Visit: Payer: Self-pay

## 2018-07-07 ENCOUNTER — Ambulatory Visit
Admission: RE | Admit: 2018-07-07 | Discharge: 2018-07-07 | Disposition: A | Discharge status: Home / Self Care | Payer: Medicaid Other | Source: Ambulatory Visit | Attending: Nurse Practitioner | Admitting: Nurse Practitioner

## 2018-07-07 DIAGNOSIS — C539 Malignant neoplasm of cervix uteri, unspecified: Secondary | ICD-10-CM | POA: Diagnosis present

## 2018-07-07 HISTORY — DX: Unspecified asthma, uncomplicated: J45.909

## 2018-07-07 LAB — PAP LB AND HPV HIGH-RISK: HPV, high-risk: NEGATIVE

## 2018-07-07 LAB — POCT I-STAT CREATININE: Creatinine, Ser: 0.9 mg/dL (ref 0.44–1.00)

## 2018-07-07 MED ORDER — IOHEXOL 300 MG/ML  SOLN
100.0000 mL | Freq: Once | INTRAMUSCULAR | Status: AC | PRN
  Administered 2018-07-07: 100 mL via INTRAVENOUS

## 2018-07-13 ENCOUNTER — Other Ambulatory Visit: Payer: Self-pay | Admitting: Nurse Practitioner

## 2018-07-13 ENCOUNTER — Other Ambulatory Visit: Payer: Self-pay

## 2018-07-13 ENCOUNTER — Telehealth: Payer: Self-pay | Admitting: *Deleted

## 2018-07-13 DIAGNOSIS — R198 Other specified symptoms and signs involving the digestive system and abdomen: Secondary | ICD-10-CM

## 2018-07-13 NOTE — Progress Notes (Signed)
Cassidy Mathis to send paper order to IR for drainage and cytology of RLQ seroma.

## 2018-07-13 NOTE — Telephone Encounter (Signed)
Called patient with results which were also reviewed by Dr. Theora Gianotti. Patient is still having right lower quadrant abdominal which is likely correlated with seroma. Dr. Theora Gianotti recommends draining and sending fluid for cytology. Patient would like to proceed with this. 'll send referral to IR.

## 2018-07-13 NOTE — Telephone Encounter (Signed)
Patient called asking to be called back with her results

## 2018-07-13 NOTE — Progress Notes (Signed)
Invasive checklist sent to special scheduling

## 2018-07-17 ENCOUNTER — Other Ambulatory Visit: Payer: Self-pay | Admitting: Nurse Practitioner

## 2018-07-17 DIAGNOSIS — N949 Unspecified condition associated with female genital organs and menstrual cycle: Secondary | ICD-10-CM

## 2018-07-17 DIAGNOSIS — M545 Low back pain, unspecified: Secondary | ICD-10-CM

## 2018-07-17 NOTE — Progress Notes (Signed)
Called patient. Discussed that Dr. Kathlene Cote doesn't feel that cyst can be accessed safely to aspirate. He recommends repeating CT in 1-2 months to re-evaluate. Patient agreeable with this plan and I asked her to call clinic if symptoms worsen in the interim.   She also complains of ongoing back pain. She saw pcp virtually and feels pain medication not significantly relieving pain. I will send referral to ortho for consideration of imaging, evaluation, and management.

## 2018-08-05 ENCOUNTER — Other Ambulatory Visit: Payer: Self-pay | Admitting: Family Medicine

## 2018-08-05 DIAGNOSIS — I10 Essential (primary) hypertension: Secondary | ICD-10-CM

## 2018-08-25 ENCOUNTER — Telehealth: Payer: Self-pay

## 2018-08-25 DIAGNOSIS — M545 Low back pain, unspecified: Secondary | ICD-10-CM

## 2018-08-25 NOTE — Telephone Encounter (Signed)
Referral faxed to [facility] 

## 2018-08-28 ENCOUNTER — Ambulatory Visit: Admission: RE | Admit: 2018-08-28 | Payer: Medicaid Other | Source: Ambulatory Visit

## 2018-09-11 ENCOUNTER — Telehealth: Payer: Self-pay

## 2018-09-11 NOTE — Telephone Encounter (Signed)
Second request sent to [facility] ortho for consult.

## 2018-09-18 ENCOUNTER — Telehealth: Payer: Self-pay

## 2018-09-18 NOTE — Telephone Encounter (Signed)
Received call from [place] at [facility] ortho. Ms. Carbone was scheduled to see Dr. Rudene Christians 09/04/18 and did not show.

## 2018-09-27 ENCOUNTER — Ambulatory Visit: Payer: Medicaid Other

## 2018-09-27 ENCOUNTER — Inpatient Hospital Stay: Payer: Medicaid Other | Attending: Obstetrics and Gynecology

## 2018-09-27 NOTE — Progress Notes (Deleted)
Gynecologic Oncology Interval Visit   Referring Provider: Dr Ouida Sills  Chief Concern: Stage IB1 cervical cancer, surveillance visit  Chief Complaint: Stage IB1 squamous cell cervical carcinoma  Subjective:  Cassidy Mathis is a 43 y.o. G84P4 female with  Stage IB1 squamous cell cervical carcinoma, well-differentiated s/p radical hysterectomy, bilateral salpingectomy, with bilateral pelvic sentinel lymph node mapping, left pelvic sentinel node biopsies x 2, right pelvic lymph node dissection on 04/14/2017, who returns to clinic for surveillance pelvic exam.     Today, she complains of right lower hip cramp that started last night, described as sharp. She says pain kept her up last night despite ibuprofen. Urinary complaints are improved since surgery but she continues to have urgency. She's been taking Denies pelvic pain, bleeding, discharge, or leg swelling. Blood pressure has been better controlled and she denies headaches. She unfortunately continues to smoke.   Gynecologic Oncology History:  Cassidy Mathis is a pleasant G4P3 female with  Stage IB1 squamous cell cervical carcinoma, well-differentiated s/p radical hysterectomy, bilateral salpingectomy, with bilateral pelvic sentinel lymph node mapping, left pelvic sentinel node biopsies x 2, right pelvic lymph node dissection on 04/14/2017. See prior notes for complete details.   - 12/18 PAP ASCUS, + HR HPV. - 03/03/17 Colposcopy with Dr Melven Sartorius  and Bxs from ectocervix at 89, 13 and ECC well diff squamous cell cancer.    - 03/23/17- exam under anesthesia, cold knife cone, and endocervical curettage with Dr. Theora Gianotti at [facility]. Pathology:  A. CERVIX; COLD KNIFE CONE EXCISION: - SQUAMOUS CELL CARCINOMA, KERATINIZING TYPE, DEPTH OF INVASION 9 MM. - INVASIVE CARCINOMA BROADLY INVOLVES THE ECTOCERVICAL MARGIN.  B. ENDOCERVIX; CURETTAGE: - SQUAMOUS CELL CARCINOMA, ONE 0.5 MM DETACHED FRAGMENT. - SCANT BENIGN ENDOCERVICAL AND ENDOMETRIAL  TISSUE.  03/31/17- Post-operative cervical infection treated with antibiotics  04/12/2017 Preop CT scan  IMPRESSION: No evidence for metastatic disease within the abdomen or pelvis.  04/14/2017- Underwent radical hysterectomy, bilateral salpingectomy, with bilateral pelvic sentinel lymph node mapping, left pelvic sentinel node biopsies x 2, right pelvic lymph node dissection, cystoscopy with Dr. Theora Gianotti at [facility] for FIGO stage 1B1 disease.   Pathology:  Invasive squamous cell carcinoma, well-differentiated. Tumor measures 4.5 mm in depth and up to 16 mm in lateral extent.  Tumor involves the inner third of the cervical stroma (4.5 mm of 20 mm thickness).  No lymphovascular invasion identified.  The margins are uninvolved. Left SLN 0/2;  Right nodes 0/6; Left parametrium: 1 lymph node, negative for tumor. Right parametrium: 1 lymph node, negative for tumor  05/02/2017- Case presented at [facility] Multidisciplinary Tumor [facility]. Recommendation was observation as she does not meet [place] Criteria. Continue pap screening per ASCCP Guidelines.   Post operatively there were initially concerns for infection due to fever, chills, and night sweats. Exam revealed vaginal cuff dehiscence with granulation tissue above defect. She was started on metronidazole 500mg  BID x 14 days. Otherwise she was managed with conservative therapy and provided with vaginal precautions. Cuff eventually healed well.  She has been NED since.  Complained of urinary frequency and urgency.  Drinks a lot of tea.  Had bronchitis in February 2020 with congestion and had CXR 02/23/18 and 2/4 that showed lingular pneumonia and treated with antibiotics. She used albuterol and steroid inhalers. She is a smoker (<1/2 PPD) and trying to quit. Has been followed at [facility]. She has been staying at home with her teen age children and none of them got sick.  Seen virtually by Dr.  Berchuck on 05/31/2018. She was complaining of urinary frequency &  urgency at that time. She declined UA and discussed bladder scan to assess for residual urine. She complained of poorly controlled blood pressure and headaches at that time which is followed by general medicine.  Problem List: Patient Active Problem List   Diagnosis Date Noted  . Cervical cancer, FIGO stage IB1 ([place]) 05/17/2017  . Ileus ([place]) 04/18/2017  . Postoperative anemia due to acute blood loss 04/18/2017  . Tobacco use 04/06/2017  . 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 Common bile duct stone   . Pancreatitis 12/16/2016  . 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 Incarcerated epigastric hernia     Past Medical History: Past Medical History:  Diagnosis Date  . Allergy    seasonal  . Anemia   . Asthma   . Cancer ([place])   . Depression   . GERD (gastroesophageal reflux disease)    RARE-NO MEDS  . Headache    MIGRAINES  . Hypertension    PT STATES SHE IS SUPPOSED TO BE TAKING LISINOPRIL-HCTZ BUT HAS BEEN OUT "FOR A WHILE" NEEDS TO GET ANOTHER PRESCRIPTION FROM HER PCP  . Pancreatitis   . Pilonidal cyst    TOOK ANTIBIOTIC THIS MONTH AND IT IS RESOLVED    Past Surgical History: Past Surgical History:  Procedure Laterality Date  . ABDOMINAL HYSTERECTOMY    . CERVICAL CONIZATION W/BX N/A 03/23/2017   Procedure: CONIZATION CERVIX WITH BIOPSY;  Surgeon: Gillis Ends, MD;  Location: [facility] ORS;  Service: Gynecology;  Laterality: N/A;  . CHOLECYSTECTOMY  10/21/2015   Procedure: LAPAROSCOPIC CHOLECYSTECTOMY WITH INTRAOPERATIVE CHOLANGIOGRAM;  Surgeon: Jules Husbands, MD;  Location: [facility] ORS;  Service: General;;  . CHOLECYSTECTOMY Bilateral   . CYST EXCISION     tongue AND WRIST  . EPIGASTRIC HERNIA REPAIR N/A 04/18/2015   Procedure: HERNIA REPAIR EPIGASTRIC ADULT;  Surgeon: Jules Husbands, MD;  Location: [facility] ORS;  Service: General;  Laterality: N/A;  . ERCP N/A 12/21/2016   Procedure: ENDOSCOPIC RETROGRADE CHOLANGIOPANCREATOGRAPHY  (ERCP);  Surgeon: [name], MD;  Location: [facility] ENDOSCOPY;  Service: Endoscopy;  Laterality: N/A;  . HERNIA REPAIR    . RADICAL HYSTERECTOMY  04/14/2017  . SALPINGECTOMY Bilateral 04/14/2017  . SENTINEL LYMPH NODE BIOPSY    . TUBAL LIGATION      Family History: Family History  Problem Relation Age of Onset  . Hypertension Mother   . Gallstones Mother   . Hypertension Father   . Gout Father   . Cancer Paternal Uncle        lung  . Cancer Maternal Grandmother        not sure what type of cancer  . Cancer Maternal Grandfather        not sure what type of cancer  . Cancer Paternal Grandmother        not sure what type of cancer  . Cancer Paternal Grandfather        not sure what type of cancer    Social History: Social History   Socioeconomic History  . Marital status: Single    Spouse name: Not on file  . Number of children: 3  . Years of education: Not on file  . Highest education level: Not on file  Occupational History  . Not on file  Social Needs  . Financial resource strain: Not hard at all  . Food insecurity    Worry: Never true  Inability: Never true  . Transportation needs    Medical: No    Non-medical: No  Tobacco Use  . Smoking status: Current Every Day Smoker    Packs/day: 0.50    Years: 12.00    Pack years: 6.00    Types: Cigarettes  . Smokeless tobacco: Never Used  Substance and Sexual Activity  . Alcohol use: No    Alcohol/week: 0.0 standard drinks    Frequency: Never  . Drug use: Yes    Types: Marijuana    Comment: "not often"  . Sexual activity: Yes    Partners: Male  Lifestyle  . Physical activity    Days per week: 0 days    Minutes per session: 0 min  . Stress: Only a little  Relationships  . Social connections    Talks on phone: More than three times a week    Gets together: More than three times a week    Attends religious service: Never    Active member of club or organization: No    Attends meetings of clubs or  organizations: Not on file    Relationship status: Never married  . Intimate partner violence    Fear of current or ex partner: No    Emotionally abused: No    Physically abused: No    Forced sexual activity: No  Other Topics Concern  . Not on file  Social History Narrative  . Not on file    Allergies: No Known Allergies  Current Medications: Current Outpatient Medications  Medication Sig Dispense Refill  . albuterol (PROVENTIL HFA;VENTOLIN HFA) 108 (90 Base) MCG/ACT inhaler Inhale 2 puffs into the lungs every 6 (six) hours as needed for wheezing or shortness of breath. 1 Inhaler 2  . amLODipine (NORVASC) 5 MG tablet TAKE 1 TABLET(5 MG) BY MOUTH DAILY AFTER LUNCH 90 tablet 0  . fexofenadine-pseudoephedrine (ALLEGRA-D) 60-120 MG 12 hr tablet Take 1 tablet by mouth 2 (two) times daily. 20 tablet 0  . lisinopril (PRINIVIL,ZESTRIL) 20 MG tablet Take 1 tablet (20 mg total) by mouth daily. 90 tablet 1   No current facility-administered medications for this visit.    Review of Systems General:  no complaints Skin: no complaints Eyes: no complaints HEENT: no complaints Breasts: no complaints Pulmonary: no complaints Cardiac: no complaints Gastrointestinal: no complaints Genitourinary/Sexual: bladder issues since surgery w/ urgency Ob/Gyn: no complaints Musculoskeletal: back & hip pain resolved  Hematology: no complaints Neurologic/Psych: no complaints  Objective:  Physical Examination:  There were no vitals filed for this visit. There is no height or weight on file to calculate BMI. LMP 03/18/2017 (Exact Date)    ECOG Performance Status: 1 - Symptomatic but completely ambulatory  GENERAL: Patient is a well appearing female in no acute distress HEENT:  Sclera clear. Anicteric NODES:  Negative axillary, supraclavicular, inguinal lymph node survery LUNGS:  Clear to auscultation bilaterally.   HEART:  Regular rate and rhythm.  ABDOMEN:  Soft, nontender.  No hernias,  incisions well healed. No masses or ascites EXTREMITIES:  No peripheral edema. Atraumatic. No cyanosis SKIN:  Clear with no obvious rashes or skin changes.  NEURO:  Nonfocal. Well oriented.  Appropriate affect.  ***Pelvic: exam chaperoned by NP Vulva: normal appearing vulva with no masses, tenderness or lesions; Vagina: normal on exam. Pap obtained.  BME: 3 cm mass right which may represent the right ovary. Left negative for masses or nodularity; Uterus and Cervix: surgically absent; Rectal: deferred  Assessment:  Cassidy Mathis is a  43 y.o. 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 Low risk pathology factors, so no adjuvant therapy recommended. Vaginal cuff defect completely healed.   Back pain may be musculoskeletal. Given concurrent finding of possible right pelvic mass concern for recurrence.   Hypertension, better controlled.   Urinary urgency chronic, suspect due to radical hysterectomy.   Pneumonia diagnosed at end of January with persistent bronchitis and followed by medicine.  No point in COVID testing at this point, although may be worth checking antibody test when this is more available.   Medical co-morbidities complicating care: smokes half pack day, There is no height or weight on file to calculate BMI.   Plan:   Problem List Items Addressed This Visit      Other   Cervical cancer, FIGO stage IB1 ([place]) - Primary     Obtain CT scan to assess for recurrent disease and etiology of back pain. If negative for recurrence then RTC for exam in 3 months and if exam reassuring begin alternating with Dr Ouida Sills.    Discussed urinary issues are most likely secondary to surgery with radical hysterectomy and most likely will not improve.   I discussed the  assessment and treatment plan with the patient. The patient was provided an opportunity to ask questions and all were answered. The patient agreed with the plan and demonstrated an understanding of the instructions.   The patient was advised to call back or seek an in-person evaluation if the symptoms worsen or if the condition fails to improve as anticipated.   Beckey Rutter, DNP, AGNP-C [place] at [facility] [phone] (work cell) [phone] (office)  I personally had a face to face interaction and evaluated the patient jointly with the NP, Ms. Beckey Rutter.  I have reviewed her history and available records and have performed the key portions of the physical exam including general, HEENT, lymph node survey, abdominal exam, pelvic exam with my findings confirming those documented above by the APP.  I have discussed the case with the APP and the patient.  I agree with the above documentation, assessment and plan which was fully formulated by me.  Counseling was completed by me.   I personally saw the patient and performed a substantive portion of this encounter in conjunction with the listed APP as documented above.  A total of 25 minutes were spent with the patient/family today; at least 50% was spent in education, counseling and coordination of care for cervical cancer.   Angeles Gaetana Michaelis, MD   CC:  Dr Ouida Sills

## 2018-10-24 NOTE — Progress Notes (Deleted)
Tried calling patient unable to leave message.

## 2018-10-25 ENCOUNTER — Inpatient Hospital Stay: Payer: Medicaid Other

## 2018-10-25 NOTE — Progress Notes (Deleted)
Gynecologic Oncology Interval Visit   Referring Provider: Dr Ouida Sills  Chief Concern: Stage IB1 cervical cancer, surveillance visit  Chief Complaint: Stage IB1 squamous cell cervical carcinoma  Subjective:  Cassidy Mathis is a 43 y.o. G42P4 female with  Stage IB1 squamous cell cervical carcinoma, well-differentiated s/p radical hysterectomy, bilateral salpingectomy, with bilateral pelvic sentinel lymph node mapping, left pelvic sentinel node biopsies x 2, right pelvic lymph node dissection on 04/14/2017, who returns to clinic for surveillance pelvic exam.   She saw Dr. Theora Gianotti on 06/28/2018. She complained of hip pain at that time. Exam revealed palpable right pelvic mass.   07/07/2018- CT abdomen pelvis w contrast - Loculated fluid collection in right adnexa, most likely due to postop lymphocele or seroma measuring 6.8 x 4.3 cm.  - No evidence of recurrent or metastatic cervical carcinoma.  She was referred to IR to have seroma drained. Dr. Kathlene Cote reviewed imaging but didn't feel that cyst could be safely accessed and recommended repeating CT to re-evaluate.   She was referred to ortho for hip pain but did not show to appointment.   She has chronic urinary issues/urgency secondary to radical hysterectomy. She continues to smoke. She denies pelvic pain, bleeding, discharge, or leg swelling. Blood pressure ***.    Gynecologic Oncology History:  Cassidy Mathis is a pleasant G4P3 female with  Stage IB1 squamous cell cervical carcinoma, well-differentiated s/p radical hysterectomy, bilateral salpingectomy, with bilateral pelvic sentinel lymph node mapping, left pelvic sentinel node biopsies x 2, right pelvic lymph node dissection on 04/14/2017. See prior notes for complete details.   - 12/18 PAP ASCUS, + HR HPV. - 03/03/17 Colposcopy with Dr Melven Sartorius  and Bxs from ectocervix at 89, 13 and ECC well diff squamous cell cancer.    - 03/23/17- exam under anesthesia, cold knife cone, and  endocervical curettage with Dr. Theora Gianotti at [facility]. Pathology:  A. CERVIX; COLD KNIFE CONE EXCISION: - SQUAMOUS CELL CARCINOMA, KERATINIZING TYPE, DEPTH OF INVASION 9 MM. - INVASIVE CARCINOMA BROADLY INVOLVES THE ECTOCERVICAL MARGIN.  B. ENDOCERVIX; CURETTAGE: - SQUAMOUS CELL CARCINOMA, ONE 0.5 MM DETACHED FRAGMENT. - SCANT BENIGN ENDOCERVICAL AND ENDOMETRIAL TISSUE.  03/31/17- Post-operative cervical infection treated with antibiotics  04/12/2017 Preop CT scan  IMPRESSION: No evidence for metastatic disease within the abdomen or pelvis.  04/14/2017- Underwent radical hysterectomy, bilateral salpingectomy, with bilateral pelvic sentinel lymph node mapping, left pelvic sentinel node biopsies x 2, right pelvic lymph node dissection, cystoscopy with Dr. Theora Gianotti at [facility] for FIGO stage 1B1 disease.   Pathology:  Invasive squamous cell carcinoma, well-differentiated. Tumor measures 4.5 mm in depth and up to 16 mm in lateral extent.  Tumor involves the inner third of the cervical stroma (4.5 mm of 20 mm thickness).  No lymphovascular invasion identified.  The margins are uninvolved. Left SLN 0/2;  Right nodes 0/6; Left parametrium: 1 lymph node, negative for tumor. Right parametrium: 1 lymph node, negative for tumor  05/02/2017- Case presented at [facility] Multidisciplinary Tumor Board. Recommendation was observation as she does not meet [place] Criteria. Continue pap screening per ASCCP Guidelines.   Post operatively there were initially concerns for infection due to fever, chills, and night sweats. Exam revealed vaginal cuff dehiscence with granulation tissue above defect. She was started on metronidazole 500mg  BID x 14 days. Otherwise she was managed with conservative therapy and provided with vaginal precautions. Cuff eventually healed well.  She has been NED since.  Complained of urinary frequency and urgency.  Drinks a lot of tea.  Had bronchitis in February 2020 with congestion and had CXR 02/23/18 and 2/4  that showed lingular pneumonia and treated with antibiotics. She used albuterol and steroid inhalers. She is a smoker (<1/2 PPD) and trying to quit. Has been followed at [facility]. She has been staying at home with her teen age children and none of them got sick.  Seen virtually by Dr. Fransisca Connors on 05/31/2018. She was complaining of urinary frequency & urgency at that time. She declined UA and discussed bladder scan to assess for residual urine. She complained of poorly controlled blood pressure and headaches at that time which is followed by general medicine.  Problem List: Patient Active Problem List   Diagnosis Date Noted  . Cervical cancer, FIGO stage IB1 ([place]) 05/17/2017  . Ileus ([place]) 04/18/2017  . Postoperative anemia due to acute blood loss 04/18/2017  . Tobacco use 04/06/2017  . Malignant neoplasm of exocervix ([place])   . Common bile duct stone   . Pancreatitis 12/16/2016  . 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 Incarcerated epigastric hernia     Past Medical History: Past Medical History:  Diagnosis Date  . Allergy    seasonal  . Anemia   . Asthma   . Cancer ([place])   . Depression   . GERD (gastroesophageal reflux disease)    RARE-NO MEDS  . Headache    MIGRAINES  . Hypertension    PT STATES SHE IS SUPPOSED TO BE TAKING LISINOPRIL-HCTZ BUT HAS BEEN OUT "FOR A WHILE" NEEDS TO GET ANOTHER PRESCRIPTION FROM HER PCP  . Pancreatitis   . Pilonidal cyst    TOOK ANTIBIOTIC THIS MONTH AND IT IS RESOLVED    Past Surgical History: Past Surgical History:  Procedure Laterality Date  . ABDOMINAL HYSTERECTOMY    . CERVICAL CONIZATION W/BX N/A 03/23/2017   Procedure: CONIZATION CERVIX WITH BIOPSY;  Surgeon: Gillis Ends, MD;  Location: [facility] ORS;  Service: Gynecology;  Laterality: N/A;  . CHOLECYSTECTOMY  10/21/2015   Procedure: LAPAROSCOPIC CHOLECYSTECTOMY WITH INTRAOPERATIVE CHOLANGIOGRAM;  Surgeon: Jules Husbands, MD;  Location:  [facility] ORS;  Service: General;;  . CHOLECYSTECTOMY Bilateral   . CYST EXCISION     tongue AND WRIST  . EPIGASTRIC HERNIA REPAIR N/A 04/18/2015   Procedure: HERNIA REPAIR EPIGASTRIC ADULT;  Surgeon: Jules Husbands, MD;  Location: [facility] ORS;  Service: General;  Laterality: N/A;  . ERCP N/A 12/21/2016   Procedure: ENDOSCOPIC RETROGRADE CHOLANGIOPANCREATOGRAPHY (ERCP);  Surgeon: [name], MD;  Location: [facility] ENDOSCOPY;  Service: Endoscopy;  Laterality: N/A;  . HERNIA REPAIR    . RADICAL HYSTERECTOMY  04/14/2017  . SALPINGECTOMY Bilateral 04/14/2017  . SENTINEL LYMPH NODE BIOPSY    . TUBAL LIGATION      Family History: Family History  Problem Relation Age of Onset  . Hypertension Mother   . Gallstones Mother   . Hypertension Father   . Gout Father   . Cancer Paternal Uncle        lung  . Cancer Maternal Grandmother        not sure what type of cancer  . Cancer Maternal Grandfather        not sure what type of cancer  . Cancer Paternal Grandmother        not sure what type of cancer  . Cancer Paternal Grandfather        not sure what type of cancer    Social History: Social History   Socioeconomic History  .  Marital status: Single    Spouse name: Not on file  . Number of children: 3  . Years of education: Not on file  . Highest education level: Not on file  Occupational History  . Not on file  Social Needs  . Financial resource strain: Not hard at all  . Food insecurity    Worry: Never true    Inability: Never true  . Transportation needs    Medical: No    Non-medical: No  Tobacco Use  . Smoking status: Current Every Day Smoker    Packs/day: 0.50    Years: 12.00    Pack years: 6.00    Types: Cigarettes  . Smokeless tobacco: Never Used  Substance and Sexual Activity  . Alcohol use: No    Alcohol/week: 0.0 standard drinks    Frequency: Never  . Drug use: Yes    Types: Marijuana    Comment: "not often"  . Sexual activity: Yes    Partners: Male  Lifestyle  .  Physical activity    Days per week: 0 days    Minutes per session: 0 min  . Stress: Only a little  Relationships  . Social connections    Talks on phone: More than three times a week    Gets together: More than three times a week    Attends religious service: Never    Active member of club or organization: No    Attends meetings of clubs or organizations: Not on file    Relationship status: Never married  . Intimate partner violence    Fear of current or ex partner: No    Emotionally abused: No    Physically abused: No    Forced sexual activity: No  Other Topics Concern  . Not on file  Social History Narrative  . Not on file    Allergies: No Known Allergies  Current Medications: Current Outpatient Medications  Medication Sig Dispense Refill  . albuterol (PROVENTIL HFA;VENTOLIN HFA) 108 (90 Base) MCG/ACT inhaler Inhale 2 puffs into the lungs every 6 (six) hours as needed for wheezing or shortness of breath. 1 Inhaler 2  . amLODipine (NORVASC) 5 MG tablet TAKE 1 TABLET(5 MG) BY MOUTH DAILY AFTER LUNCH 90 tablet 0  . fexofenadine-pseudoephedrine (ALLEGRA-D) 60-120 MG 12 hr tablet Take 1 tablet by mouth 2 (two) times daily. 20 tablet 0  . lisinopril (PRINIVIL,ZESTRIL) 20 MG tablet Take 1 tablet (20 mg total) by mouth daily. 90 tablet 1   No current facility-administered medications for this visit.    Review of Systems General:  no complaints Skin: no complaints Eyes: no complaints HEENT: no complaints Breasts: no complaints Pulmonary: no complaints Cardiac: no complaints Gastrointestinal: no complaints Genitourinary/Sexual: chronic urinary urgency Ob/Gyn: no complaints Musculoskeletal: no complaints Hematology: no complaints Neurologic/Psych: no complaints  Objective:  Physical Examination:  There were no vitals filed for this visit. There is no height or weight on file to calculate BMI. LMP 03/18/2017 (Exact Date)    ECOG Performance Status: 1 - Symptomatic but  completely ambulatory  GENERAL: Patient is a well appearing female in no acute distress HEENT:  Sclera clear. Anicteric NODES:  Negative axillary, supraclavicular, inguinal lymph node survery LUNGS:  Clear to auscultation bilaterally.   HEART:  Regular rate and rhythm.  ABDOMEN:  Soft, nontender.  No hernias, incisions well healed. No masses or ascites EXTREMITIES:  No peripheral edema. Atraumatic. No cyanosis SKIN:  Clear with no obvious rashes or skin changes.  NEURO:  Nonfocal. Well  oriented.  Appropriate affect.  ***Pelvic: exam chaperoned by NP Vulva: normal appearing vulva with no masses, tenderness or lesions; Vagina: normal on exam. Pap obtained.  BME: 3 cm mass right which may represent the right ovary. Left negative for masses or nodularity; Uterus and Cervix: surgically absent; Rectal: deferred  Assessment:  Cassidy Mathis is a 43 y.o. 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 Low risk pathology factors, so no adjuvant therapy recommended. Vaginal cuff defect completely healed.   Back pain may be musculoskeletal. Given concurrent finding of possible right pelvic mass concern for recurrence.   Hypertension, better controlled.   Urinary urgency chronic, suspect due to radical hysterectomy.   Pneumonia diagnosed at end of January with persistent bronchitis and followed by medicine.  No point in COVID testing at this point, although may be worth checking antibody test when this is more available.   Medical co-morbidities complicating care: smokes half pack day, There is no height or weight on file to calculate BMI.   Plan:   Problem List Items Addressed This Visit      Other   Cervical cancer, FIGO stage IB1 ([place]) - Primary     Obtain CT scan to assess for  recurrent disease and etiology of back pain. If negative for recurrence then RTC for exam in 3 months and if exam reassuring begin alternating with Dr Ouida Sills.    Discussed urinary issues are most likely secondary to surgery with radical hysterectomy and most likely will not improve.   I discussed the assessment and treatment plan with the patient. The patient was provided an opportunity to ask questions and all were answered. The patient agreed with the plan and demonstrated an understanding of the instructions.   The patient was advised to call back or seek an in-person evaluation if the symptoms worsen or if the condition fails to improve as anticipated.   Beckey Rutter, DNP, AGNP-C [place] at [facility] [phone] (work cell) [phone] (office)  I personally had a face to face interaction and evaluated the patient jointly with the NP, Ms. Beckey Rutter.  I have reviewed her history and available records and have performed the key portions of the physical exam including general, HEENT, lymph node survey, abdominal exam, pelvic exam with my findings confirming those documented above by the APP.  I have discussed the case with the APP and the patient.  I agree with the above documentation, assessment and plan which was fully formulated by me.  Counseling was completed by me.   I personally saw the patient and performed a substantive portion of this encounter in conjunction with the listed APP as documented above.  A total of 25 minutes were spent with the patient/family today; at least 50% was spent in education, counseling and coordination of care for cervical cancer.   Angeles Gaetana Michaelis, MD   CC:  Dr Ouida Sills

## 2018-11-27 ENCOUNTER — Telehealth: Payer: Self-pay

## 2018-11-27 NOTE — Telephone Encounter (Signed)
Letter sent to Ms. Gindlesperger regarding missed appointments. Letter can be viewed under Letters.

## 2018-12-15 ENCOUNTER — Telehealth: Payer: Self-pay | Admitting: Cardiology

## 2018-12-15 NOTE — Telephone Encounter (Signed)
Patient has never been seen in our office- and no med on list at this time.

## 2018-12-15 NOTE — Telephone Encounter (Signed)
Pt c/o medication issue:  1. Name of Medication: DOXAZOSIN 8mg   2. How are you currently taking this medication (dosage and times per day)? Pharm need DIRECTIONS   [facility] [phone] [place]  3. Are you having a reaction (difficulty breathing--STAT)? No   4. What is your medication issue? Pharm needs a call back with directions

## 2019-02-02 ENCOUNTER — Telehealth: Payer: Self-pay | Admitting: *Deleted

## 2019-02-02 NOTE — Telephone Encounter (Signed)
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 She is requesting a letter for her to be out of work for 2 weeks. I did discuss with her that at the end of the 2 weeks the likelihood of there being more residents positive is high, but she would like to start with the 2 week leave. Please return her call to discuss this further [phone]

## 2019-02-06 NOTE — Telephone Encounter (Signed)
Late Entry- spoke to patient on 1/8. 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 We discussed her risk factors for covid complications and determined her to be low risk therefore, no letter will be provided and I encouraged her to discuss her concerns with her leadership and HR. I encouraged her to be compliant with PPE, social distancing, hand washing, and to get her vaccination. She has reservations regarding the vaccine which we discussed and I provided education however, she does not feel ready to take vaccine at this time. Patient is also overdue for surveillance visits at the [facility]. I reminded her of this and she has previously received a letter from [place] regarding this. I again encouraged the importance of routine surveillance and advised how we are practicing covid precautions for exams. She is not ready to make an appointment currently and will call back in the future.

## 2019-02-07 ENCOUNTER — Other Ambulatory Visit: Payer: Medicaid Other

## 2019-02-12 ENCOUNTER — Ambulatory Visit: Payer: Self-pay | Admitting: *Deleted

## 2019-03-15 IMAGING — CR DG CHEST 2V
2 series · 2 of 2 positions shown · non-contrast
Comparison: 02/23/2018

CLINICAL DATA: Continued cough after diagnosis with pneumonia last
week.

EXAM:
CHEST - 2 VIEW

[chest pa]
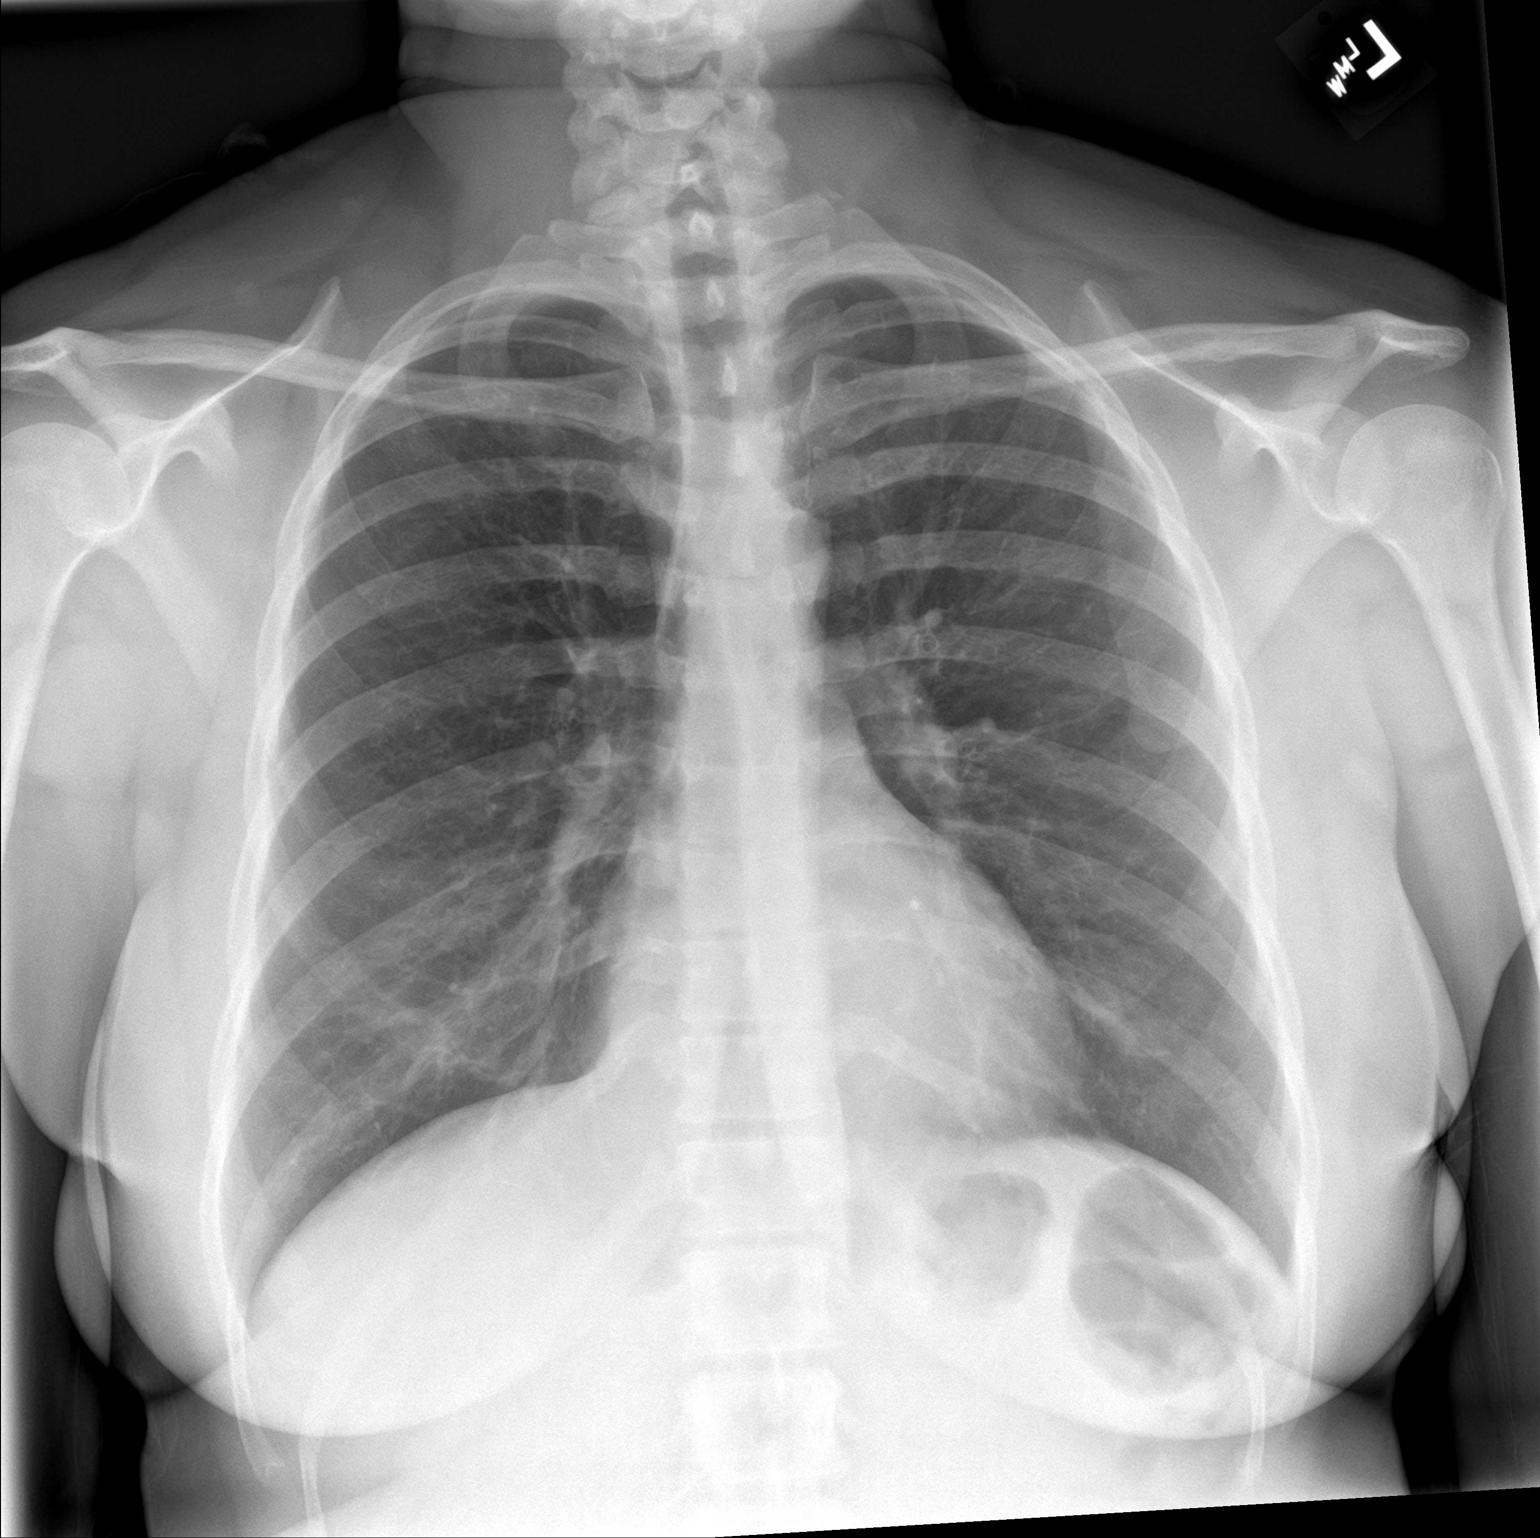

[chest lat]
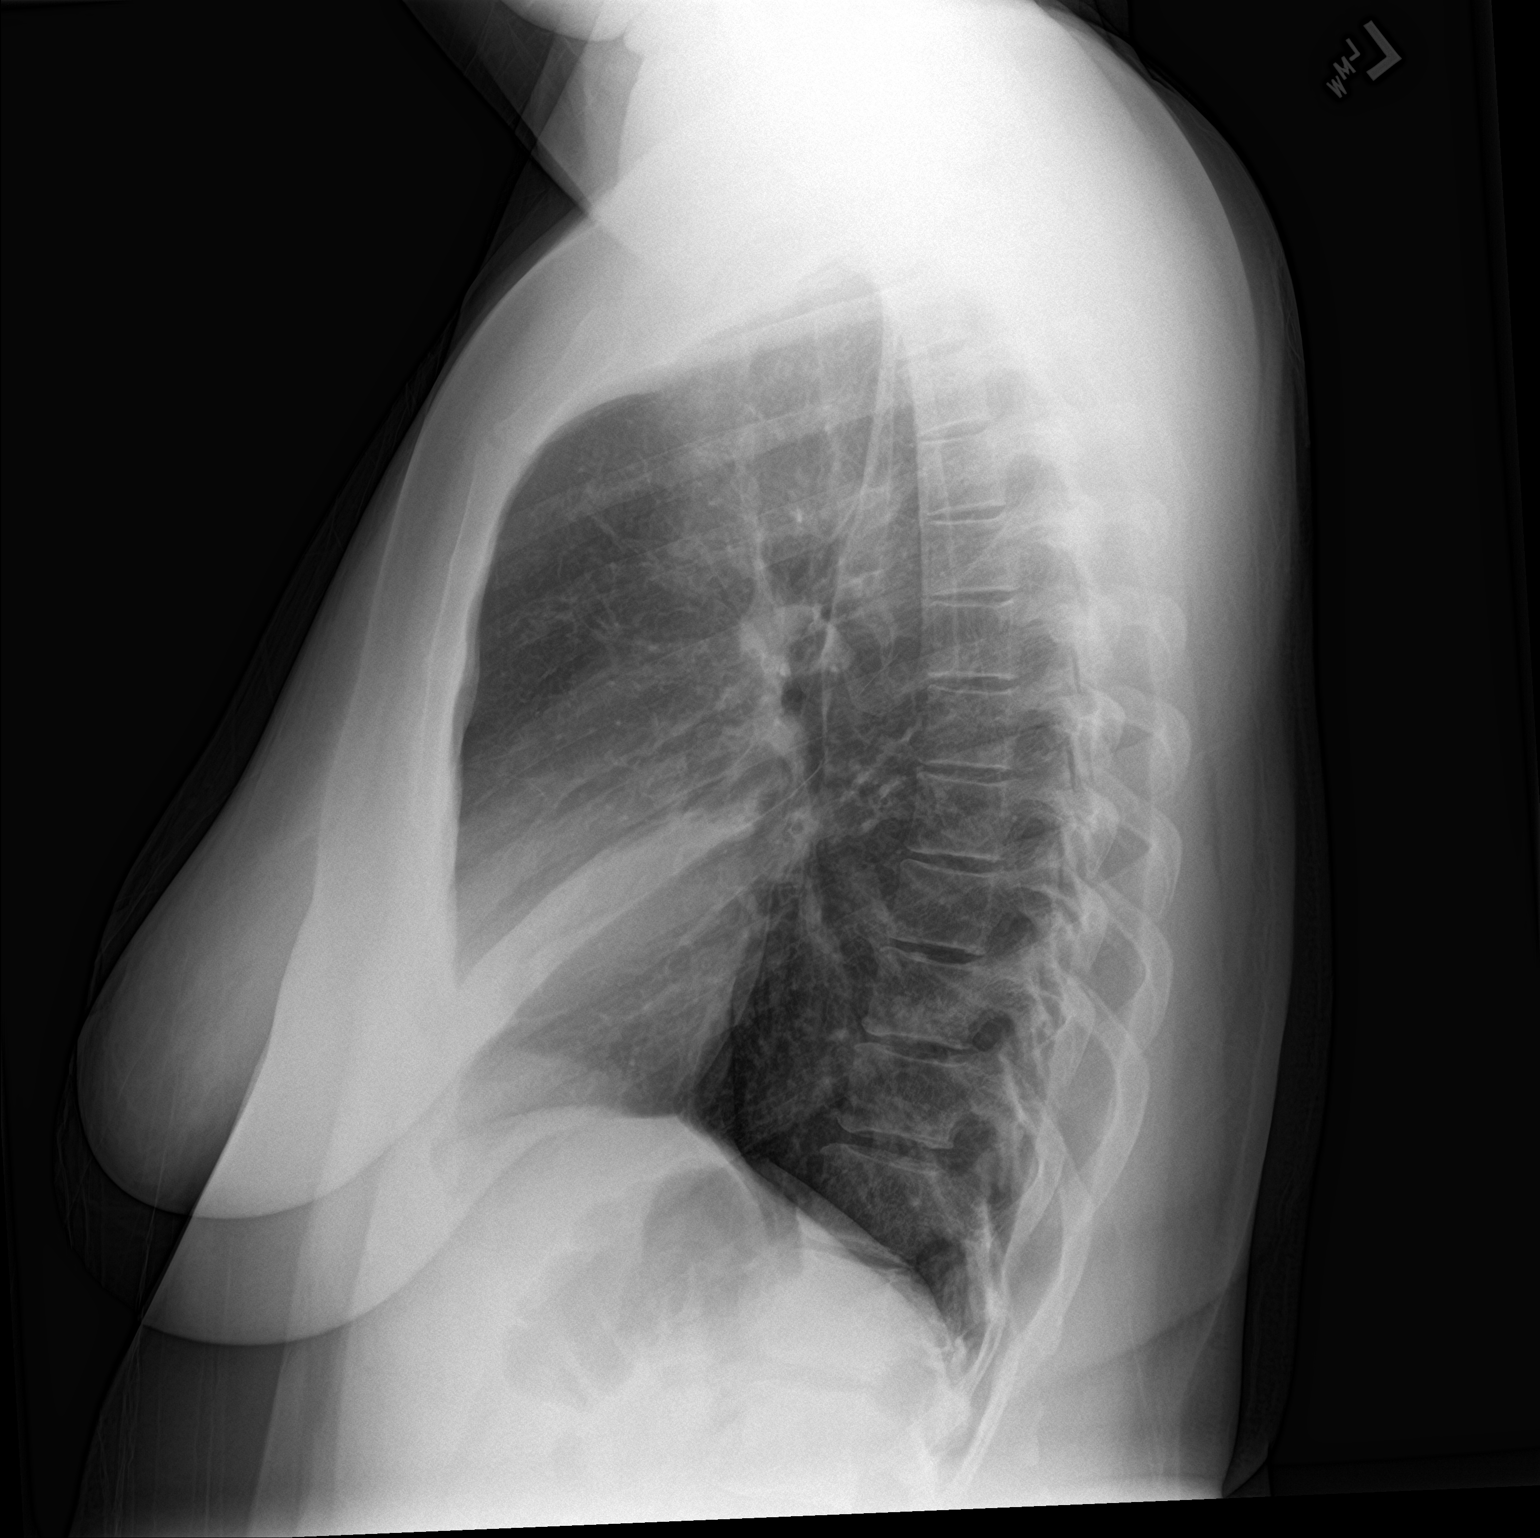

[2 of 2 positions shown; findings below may reference images not displayed]

FINDINGS: Persistent consolidation and volume loss probably in the right
middle lung. No change since previous study. Left lung remains clear
and expanded. No blunting of costophrenic angles. No pneumothorax.
Mediastinal contours appear intact. Normal heart size and pulmonary
vascularity.
IMPRESSION: Persistent consolidation and volume loss in the right middle lung
without significant change.

## 2019-05-05 ENCOUNTER — Emergency Department
Admission: EM | Admit: 2019-05-05 | Discharge: 2019-05-06 | Disposition: A | Discharge status: Home / Self Care | Payer: Medicaid Other | Attending: Emergency Medicine | Admitting: Emergency Medicine

## 2019-05-05 ENCOUNTER — Emergency Department: Payer: Medicaid Other

## 2019-05-05 ENCOUNTER — Other Ambulatory Visit: Payer: Self-pay

## 2019-05-05 ENCOUNTER — Encounter: Payer: Self-pay | Admitting: Emergency Medicine

## 2019-05-05 DIAGNOSIS — Z79899 Other long term (current) drug therapy: Secondary | ICD-10-CM | POA: Diagnosis not present

## 2019-05-05 DIAGNOSIS — M539 Dorsopathy, unspecified: Secondary | ICD-10-CM | POA: Insufficient documentation

## 2019-05-05 DIAGNOSIS — F17210 Nicotine dependence, cigarettes, uncomplicated: Secondary | ICD-10-CM | POA: Diagnosis not present

## 2019-05-05 DIAGNOSIS — I10 Essential (primary) hypertension: Secondary | ICD-10-CM | POA: Diagnosis not present

## 2019-05-05 DIAGNOSIS — R29898 Other symptoms and signs involving the musculoskeletal system: Secondary | ICD-10-CM

## 2019-05-05 DIAGNOSIS — J45909 Unspecified asthma, uncomplicated: Secondary | ICD-10-CM | POA: Insufficient documentation

## 2019-05-05 DIAGNOSIS — R29818 Other symptoms and signs involving the nervous system: Secondary | ICD-10-CM | POA: Insufficient documentation

## 2019-05-05 DIAGNOSIS — R202 Paresthesia of skin: Secondary | ICD-10-CM | POA: Insufficient documentation

## 2019-05-05 LAB — CBC WITH DIFFERENTIAL/PLATELET
Abs Immature Granulocytes: 0.01 10*3/uL (ref 0.00–0.07)
Basophils Absolute: 0 10*3/uL (ref 0.0–0.1)
Basophils Relative: 0 %
Eosinophils Absolute: 0.2 10*3/uL (ref 0.0–0.5)
Eosinophils Relative: 2 %
HCT: 38.7 % (ref 36.0–46.0)
Hemoglobin: 12.7 g/dL (ref 12.0–15.0)
Immature Granulocytes: 0 %
Lymphocytes Relative: 35 %
Lymphs Abs: 2.8 10*3/uL (ref 0.7–4.0)
MCH: 29.6 pg (ref 26.0–34.0)
MCHC: 32.8 g/dL (ref 30.0–36.0)
MCV: 90.2 fL (ref 80.0–100.0)
Monocytes Absolute: 0.3 10*3/uL (ref 0.1–1.0)
Monocytes Relative: 4 %
Neutro Abs: 4.5 10*3/uL (ref 1.7–7.7)
Neutrophils Relative %: 59 %
Platelets: 373 10*3/uL (ref 150–400)
RBC: 4.29 MIL/uL (ref 3.87–5.11)
RDW: 13.3 % (ref 11.5–15.5)
WBC: 7.9 10*3/uL (ref 4.0–10.5)
nRBC: 0 % (ref 0.0–0.2)

## 2019-05-05 LAB — BASIC METABOLIC PANEL
Anion gap: 8 (ref 5–15)
BUN: 8 mg/dL (ref 6–20)
CO2: 24 mmol/L (ref 22–32)
Calcium: 8.7 mg/dL — ABNORMAL LOW (ref 8.9–10.3)
Chloride: 107 mmol/L (ref 98–111)
Creatinine, Ser: 0.87 mg/dL (ref 0.44–1.00)
GFR calc Af Amer: >60 mL/min (ref >60)
GFR calc non Af Amer: >60 mL/min (ref >60)
Glucose, Bld: 96 mg/dL (ref 70–99)
Potassium: 3.3 mmol/L — ABNORMAL LOW (ref 3.5–5.1)
Sodium: 139 mmol/L (ref 135–145)

## 2019-05-05 LAB — SEDIMENTATION RATE: Sed Rate: 19 mm/hr (ref 0–20)

## 2019-05-05 LAB — TROPONIN I (HIGH SENSITIVITY): Troponin I (High Sensitivity): 8 ng/L (ref <18)

## 2019-05-05 MED ORDER — PENTAFLUOROPROP-TETRAFLUOROETH EX AERO: 1.0000 "application " | INHALATION_SPRAY | CUTANEOUS | Status: DC | PRN

## 2019-05-05 MED ORDER — GADOBUTROL 1 MMOL/ML IV SOLN
7.5000 mL | Freq: Once | INTRAVENOUS | Status: AC | PRN
  Administered 2019-05-06: 7.5 mL via INTRAVENOUS
  Filled 2019-05-05: qty 7.5

## 2019-05-05 MED ORDER — LORAZEPAM 1 MG PO TABS
1.0000 mg | ORAL_TABLET | Freq: Once | ORAL | Status: AC
  Administered 2019-05-05: 1 mg via ORAL
  Filled 2019-05-05: qty 1

## 2019-05-05 MED ORDER — HYDROCODONE-ACETAMINOPHEN 5-325 MG PO TABS
1.0000 | ORAL_TABLET | Freq: Once | ORAL | Status: AC
  Administered 2019-05-05: 21:00:00 1 via ORAL
  Filled 2019-05-05: qty 1

## 2019-05-05 NOTE — ED Provider Notes (Signed)
[facility] Emergency Department Provider Note ____________________________________________  Time seen: 1640  I have reviewed the triage vital signs and the nursing notes.  HISTORY  Chief Complaint  Shoulder Pain  HPI Cassidy Mathis is a 44 y.o.right-handed female presents with right-sided shoulder pain with referral down to the fingertips. She reports the pain as numbness and tingling. She describes sharp, pressure/tightness to the entire arm, intermittently, lasting several minutes, over the last few days. She reports the sense of a "heavy" arm, but denies any sensation, skin temp/color, or grip changes. She denies any recent trauma or falls. She also denies any history of chronic or ongoing neck, shoulder, or arm problems. She is currently being treated by her provider for sciatic pain with Gabapentin. She also complains of right>left LE pain and weakness. She denies any benefit with the Gabapentin she is taking. She has  She denies fevers, chills, sweats, or chest pain.  Patient's medical history is also consistent with a cervical cancer treated with hysterectomy 2 years prior.  Past Medical History:  Diagnosis Date  . Allergy    seasonal  . Anemia   . Asthma   . Cancer ([place])   . Depression   . GERD (gastroesophageal reflux disease)    RARE-NO MEDS  . Headache    MIGRAINES  . Hypertension    PT STATES SHE IS SUPPOSED TO BE TAKING LISINOPRIL-HCTZ BUT HAS BEEN OUT "FOR A WHILE" NEEDS TO GET ANOTHER PRESCRIPTION FROM HER PCP  . Pancreatitis   . Pilonidal cyst    TOOK ANTIBIOTIC THIS MONTH AND IT IS RESOLVED    Patient Active Problem List   Diagnosis Date Noted  . Cervical cancer, FIGO stage IB1 ([place]) 05/17/2017  . Ileus ([place]) 04/18/2017  . Postoperative anemia due to acute blood loss 04/18/2017  . Tobacco use 04/06/2017  . 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 Common bile duct stone   . Pancreatitis 12/16/2016  . Calculus of gallbladder with  chronic cholecystitis without obstruction   . Incarcerated epigastric hernia     Past Surgical History:  Procedure Laterality Date  . ABDOMINAL HYSTERECTOMY    . CERVICAL CONIZATION W/BX N/A 03/23/2017   Procedure: CONIZATION CERVIX WITH BIOPSY;  Surgeon: Gillis Ends, MD;  Location: [facility] ORS;  Service: Gynecology;  Laterality: N/A;  . CHOLECYSTECTOMY  10/21/2015   Procedure: LAPAROSCOPIC CHOLECYSTECTOMY WITH INTRAOPERATIVE CHOLANGIOGRAM;  Surgeon: Jules Husbands, MD;  Location: [facility] ORS;  Service: General;;  . CHOLECYSTECTOMY Bilateral   . CYST EXCISION     tongue AND WRIST  . EPIGASTRIC HERNIA REPAIR N/A 04/18/2015   Procedure: HERNIA REPAIR EPIGASTRIC ADULT;  Surgeon: Jules Husbands, MD;  Location: [facility] ORS;  Service: General;  Laterality: N/A;  . ERCP N/A 12/21/2016   Procedure: ENDOSCOPIC RETROGRADE CHOLANGIOPANCREATOGRAPHY (ERCP);  Surgeon: [name], MD;  Location: [facility] ENDOSCOPY;  Service: Endoscopy;  Laterality: N/A;  . HERNIA REPAIR    . RADICAL HYSTERECTOMY  04/14/2017  . SALPINGECTOMY Bilateral 04/14/2017  . SENTINEL LYMPH NODE BIOPSY    . TUBAL LIGATION      Prior to Admission medications   Medication Sig Start Date End Date Taking? Authorizing Provider  albuterol (PROVENTIL HFA;VENTOLIN HFA) 108 (90 Base) MCG/ACT inhaler Inhale 2 puffs into the lungs every 6 (six) hours as needed for wheezing or shortness of breath. 02/28/18   Merlyn Lot, MD  amLODipine (NORVASC) 5 MG tablet TAKE 1 TABLET(5 MG) BY MOUTH DAILY AFTER LUNCH 08/06/18   Sowles,  Drue Stager, MD  fexofenadine-pseudoephedrine (ALLEGRA-D) 60-120 MG 12 hr tablet Take 1 tablet by mouth 2 (two) times daily. 02/23/18   Sable Feil, PA-C  lisinopril (PRINIVIL,ZESTRIL) 20 MG tablet Take 1 tablet (20 mg total) by mouth daily. 11/16/17   Hubbard Hartshorn, FNP    Allergies Patient has no known allergies.  Family History  Problem Relation Age of Onset  . Hypertension Mother   . Gallstones Mother   .  Hypertension Father   . Gout Father   . Cancer Paternal Uncle        lung  . Cancer Maternal Grandmother        not sure what type of cancer  . Cancer Maternal Grandfather        not sure what type of cancer  . Cancer Paternal Grandmother        not sure what type of cancer  . Cancer Paternal Grandfather        not sure what type of cancer    Social History Social History   Tobacco Use  . Smoking status: Current Every Day Smoker    Packs/day: 0.50    Years: 12.00    Pack years: 6.00    Types: Cigarettes  . Smokeless tobacco: Never Used  Substance Use Topics  . Alcohol use: No    Alcohol/week: 0.0 standard drinks  . Drug use: Yes    Types: Marijuana    Comment: "not often"    Review of Systems  Constitutional: Negative for fever. Cardiovascular: Negative for chest pain. Respiratory: Negative for shortness of breath. Musculoskeletal: Negative for back pain. Reports right neck and shoulder pain.  Skin: Negative for rash. Neurological: Negative for headaches, focal weakness or numbness. ____________________________________________  PHYSICAL EXAM:  VITAL SIGNS: ED Triage Vitals  Enc Vitals Group     BP 05/05/19 1500 (!) 175/109     Pulse Rate 05/05/19 1500 82     Resp 05/05/19 1500 18     Temp 05/05/19 1500 98.3 F (36.8 C)     Temp Source 05/05/19 1500 Oral     SpO2 05/05/19 1500 100 %     Weight 05/05/19 1501 190 lb (86.2 kg)     Height 05/05/19 1501 5\' 5"  (1.651 m)     Head Circumference --      Peak Flow --      Pain Score 05/05/19 1501 8     Pain Loc --      Pain Edu? --      Excl. in [place]? --     Constitutional: Alert and oriented. Well appearing and in no distress.  A&O x3 GCS equals 15. Head: Normocephalic and atraumatic. Eyes: Conjunctivae are normal. Normal extraocular movements and fundi bilaterally. Neck: Supple. No thyromegaly. Cardiovascular: Normal rate, regular rhythm. Normal distal pulses. Respiratory: Normal respiratory effort. No  wheezes/rales/rhonchi. Gastrointestinal: Soft and nontender. No distention. Musculoskeletal: Normal spinal alignment without midline tenderness, spasm, deformity, or step-off.  Right shoulder is without any obvious deformity or dislocation.  Patient with full active range of motion on exam.  No rotator cuff deficit is elicited.  She has normal grip strength bilaterally.  Nontender with normal range of motion in all extremities.  Neurologic: Cranial nerves II through XII grossly intact.  Normal UE/LE DTRs bilaterally.  Normal gait without ataxia. Normal speech and language. No gross focal neurologic deficits are appreciated.  Negative supine straight leg raise bilaterally.  No cerebellar ataxia appreciated. Skin:  Skin is warm, dry and intact.  No rash noted. Psychiatric: Mood and affect are normal. Patient exhibits appropriate insight and judgment. ____________________________________________   LABS (pertinent positives/negatives)  Labs Reviewed  BASIC METABOLIC PANEL - Abnormal; Notable for the following components:      Result Value   Potassium 3.3 (*)    Calcium 8.7 (*)    All other components within normal limits  CBC WITH DIFFERENTIAL/PLATELET  C-REACTIVE PROTEIN  SEDIMENTATION RATE  RPR  HIV ANTIBODY (ROUTINE TESTING W REFLEX)  TROPONIN I (HIGH SENSITIVITY)  ____________________________________________   RADIOLOGY  MRI Brain w w/o CM Pending  MRI Cervical Spine Pending  MRI Thoracic Spine Pending  MRI Lumbar Spine  Pending ____________________________________________  PROCEDURES  Norco 5-325 mg PO Ativan 1 mg PO  Procedures ____________________________________________  INITIAL IMPRESSION / ASSESSMENT AND PLAN / ED COURSE  Patient with ED evaluation of sudden onset of intermittent right upper extremity pain and paresthesias.  Patient denies several episodes of pain from the anterior shoulder to the hand that lasts several minutes.  Patient was evaluated in the  face of her ongoing complaints of bilateral lower extremity weakness being treated by her PCP for sciatica.  She had no interim imaging of the cervical and/or lumbar spine.  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  Patient's labs are reassuring at this time.  Her exam is overall benign without any signs of acute neuromuscular deficit.  No cerebellar ataxia and no focal weakness.  Patient has pending MRI imaging of the brain and spine at this time to evaluate for metastatic disease versus cervical or lumbar radiculopathy versus a focal lesions concerning for demyelinating process.  Final disposition and management will be determined based on imaging results.  I will transfer care and final disposition to my attending Dr. Alfred Levins.  Timesha L Mcbrayer was evaluated in Emergency Department on 05/07/2019 for the symptoms described in the history of present illness. She was evaluated in the context of the global COVID-19 pandemic, which necessitated consideration that the patient might be at risk for infection with the SARS-CoV-2 virus that causes COVID-19. Institutional protocols and algorithms that pertain to the evaluation of patients at risk for COVID-19 are in a state of rapid change based on information released by regulatory bodies including the CDC and federal and state organizations. These policies and algorithms were followed during the patient's care in the ED. ____________________________________________  FINAL CLINICAL IMPRESSION(S) / ED DIAGNOSES  Final diagnoses:  Paresthesia of right upper extremity  Weakness of both lower extremities  Multilevel degenerative disc disease      Carmie End, Dannielle Karvonen, PA-C 05/07/19 0008    Vanessa Clifton, MD 05/07/19 [phone]

## 2019-05-05 NOTE — ED Triage Notes (Signed)
Pt to triage with c/o sharp and numb sensation from shoulder to finger tips in right arm.  Pt states happening for last week and is worse today.

## 2019-05-05 NOTE — ED Notes (Addendum)
Pt to mri. Per mri tech will take approx 1 and 1/2  hrs.

## 2019-05-06 LAB — RPR: RPR Ser Ql: NONREACTIVE

## 2019-05-06 LAB — C-REACTIVE PROTEIN: CRP: 0.6 mg/dL (ref <1.0)

## 2019-05-06 LAB — HIV ANTIBODY (ROUTINE TESTING W REFLEX): HIV Screen 4th Generation wRfx: NONREACTIVE

## 2019-05-06 NOTE — ED Provider Notes (Signed)
Medical screening examination/treatment/procedure(s) were conducted as a shared visit with non-physician practitioner(s) and myself.  I personally evaluated the patient during the encounter.  Please refer to midlevel note for further details.  In summary this is a 44 year old female who presents for intermittent right shoulder and arm pain, numbness and tingling.  Also complaining of several weeks worth of bilateral lower extremity pain and intermittent weakness.  On exam she is well-appearing in no distress with normal vital signs.  Patient is completely neurologically intact with intact sensation and strength x4, normal gait, no CT and L-spine tenderness.  Patient underwent MRI of the brain and the spine which was ordered by the midlevel after approval by Dr. Jari Pigg who signed out to me with MRIs pending.  MRI brain normal. 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 Patient referred to [facility]. Discussed return precautions for signs of cord involvement and recommended return to the ED for numbness or weakness of her extremities, urinary or bowel incontinence or retention, saddle anesthesia, or worsening symptoms.          I have personally reviewed the images performed during this visit and I agree with the Radiologist's read.   Interpretation by Radiologist:  MR Brain W and Wo Contrast  Result Date: 05/06/2019 CLINICAL DATA:  Initial evaluation for right upper extremity numbness for 1 week. EXAM: MRI HEAD WITHOUT AND WITH CONTRAST TECHNIQUE: Multiplanar, multiecho pulse sequences of the brain and surrounding structures were obtained without and with intravenous contrast. CONTRAST:  7.24mL GADAVIST GADOBUTROL 1 MMOL/ML IV SOLN COMPARISON:  Prior head CT from 08/25/2012. FINDINGS: Brain: Cerebral volume within normal limits for patient age. No focal parenchymal signal abnormality identified. No abnormal foci of restricted  diffusion to suggest acute or subacute ischemia. Gray-white matter differentiation well maintained. No encephalomalacia to suggest chronic infarction. No foci of susceptibility artifact to suggest acute or chronic intracranial hemorrhage. No mass lesion, midline shift or mass effect. No hydrocephalus. No extra-axial fluid collection. Major dural sinuses are grossly patent. Pituitary gland and suprasellar region are normal. Midline structures intact and normal. No abnormal enhancement. Vascular: Major intracranial vascular flow voids well maintained and normal in appearance. Skull and upper cervical spine: Craniocervical junction normal. Visualized upper cervical spine within normal limits. Bone marrow signal intensity normal. No scalp soft tissue abnormality. Sinuses/Orbits: Globes and orbital soft tissues within normal limits. Mild-to-moderate scattered mucosal thickening noted within the paranasal sinuses. Superimposed air-fluid level within the left maxillary sinus, with pneumatized secretions within the right sphenoid sinus. No mastoid effusion. Inner ear structures normal. Other: None. IMPRESSION: 1. Normal brain MRI.  No acute intracranial abnormality. 2. Mild to moderate inflammatory paranasal sinus disease. Electronically Signed   By: Jeannine Boga M.D.   On: 05/06/2019 00:56   MR Cervical Spine Wo Contrast  Result Date: 05/06/2019 CLINICAL DATA:  Initial evaluation for acute right upper extremity numbness, paresthesias. EXAM: MRI CERVICAL, THORACIC AND LUMBAR SPINE WITHOUT CONTRAST TECHNIQUE: Multiplanar and multiecho pulse sequences of the cervical spine, to include the craniocervical junction and cervicothoracic junction, and thoracic and lumbar spine, were obtained without intravenous contrast. COMPARISON:  None. FINDINGS: MRI CERVICAL SPINE FINDINGS Alignment: Reversal of the normal cervical lordosis without listhesis. Vertebrae: Vertebral body height maintained without evidence for acute or  chronic fracture. Bone marrow signal intensity diffusely decreased on T1 weighted imaging, nonspecific, but most commonly related to anemia, smoking, or obesity. No discrete or worrisome osseous lesions.  No abnormal marrow edema. Cord: Signal intensity within the cervical spinal cord is normal. Posterior Fossa, vertebral arteries, paraspinal tissues: Visualized brain and posterior fossa within normal limits. Craniocervical junction normal. Paraspinous and prevertebral soft tissues within normal limits. Normal intravascular flow voids seen within the vertebral arteries bilaterally. Disc levels: C2-C3: Unremarkable. C3-C4: Mild disc bulge. Superimposed tiny central disc protrusion minimally indents the ventral thecal sac (series 4, image 9). No significant spinal stenosis or cord deformity. Foramina remain patent. C4-C5: Mild diffuse disc bulge. Mild flattening of the ventral thecal sac without significant spinal stenosis or cord deformity. Foramina remain patent. C5-C6: Mild diffuse disc bulge. Associated small central annular fissure. Flattening of the ventral thecal sac without significant spinal stenosis or cord deformity. Foramina remain patent. C6-C7: Mild diffuse disc bulge with bilateral uncovertebral hypertrophy. Disc bulge slightly asymmetric to the right with flattening of the right ventral thecal sac and minimal flattening of the right hemi cord. Ventral right C7 nerve root could be affected. No significant spinal stenosis. Foramina remain patent. C7-T1:  Unremarkable. MRI THORACIC SPINE FINDINGS Alignment: Physiologic with preservation of the normal thoracic kyphosis. No listhesis. Vertebrae: Vertebral body height maintained without evidence for acute or chronic fracture. Bone marrow signal intensity diffusely decreased on T1 weighted imaging, nonspecific, but most commonly related to anemia, smoking, or obesity. Few scattered subcentimeter benign hemangiomata noted. No worrisome osseous lesions. No  abnormal marrow edema. Cord: Signal intensity within the thoracic spinal cord is normal. Normal cord caliber morphology. Paraspinal and other soft tissues: Unremarkable. Disc levels: No significant disc pathology seen within the thoracic spine. No focal disc herniation or significant disc bulge. No canal or neural foraminal stenosis. No impingement. MRI LUMBAR SPINE FINDINGS Segmentation: Standard. Lowest well-formed disc space labeled the L5-S1 level. Alignment: Physiologic with preservation of the normal lumbar lordosis. No listhesis. Vertebrae: Vertebral body height maintained without evidence for acute or chronic fracture. Bone marrow signal intensity diffusely decreased on T1 weighted imaging, nonspecific, but most commonly related to anemia, smoking or obesity. Subcentimeter benign hemangioma noted within the T12 vertebral body. No other discrete or worrisome osseous lesions. No abnormal marrow edema. Conus medullaris and cauda equina: Conus extends to the L2 level. Conus and cauda equina appear normal. Paraspinal and other soft tissues: Paraspinous soft tissues within normal limits. Subcentimeter simple cyst noted within the interpolar right kidney. Visualized visceral structures otherwise unremarkable. Disc levels: L1-2:  Unremarkable. L2-3:  Unremarkable. L3-4: Mild diffuse disc bulge with disc desiccation. No significant spinal stenosis. Foramina remain patent. L4-5: Mild diffuse disc bulge with disc desiccation. Mild bilateral facet hypertrophy. No canal or lateral recess stenosis. Mild left L4 foraminal narrowing. No significant right foraminal stenosis. L5-S1: Mild disc bulge with disc desiccation. No significant canal or lateral recess stenosis. Foramina remain patent. IMPRESSION: MRI CERVICAL SPINE IMPRESSION: 1. No acute abnormality within the cervical spine. Normal MRI appearance of the cervical spinal cord. 2. Right eccentric disc bulge at C6-7 with secondary mild flattening of the right hemi cord.  The ventral right C7 nerve root could be affected. 3. Additional mild noncompressive disc bulging at C3-4 through C5-6 without stenosis or impingement. MRI THORACIC SPINE IMPRESSION: Normal MRI of the thoracic spine and spinal cord. No significant disc pathology, stenosis, or evidence for neural impingement. MRI LUMBAR SPINE IMPRESSION: 1. No acute abnormality within the lumbar spine. 2. Mild disc bulging at L4-5 with resultant mild left L4 foraminal stenosis. 3. Additional mild noncompressive disc bulging at L3-4 and L5-S1 without stenosis or impingement. 4.  Diffusely decreased T1 weighted signal intensity throughout the visualized bone marrow, nonspecific, but most commonly related to anemia, smoking, or obesity. Correlation with history and laboratory values recommended. Electronically Signed   By: Jeannine Boga M.D.   On: 05/06/2019 01:19   MR THORACIC SPINE WO CONTRAST  Result Date: 05/06/2019 CLINICAL DATA:  Initial evaluation for acute right upper extremity numbness, paresthesias. EXAM: MRI CERVICAL, THORACIC AND LUMBAR SPINE WITHOUT CONTRAST TECHNIQUE: Multiplanar and multiecho pulse sequences of the cervical spine, to include the craniocervical junction and cervicothoracic junction, and thoracic and lumbar spine, were obtained without intravenous contrast. COMPARISON:  None. FINDINGS: MRI CERVICAL SPINE FINDINGS Alignment: Reversal of the normal cervical lordosis without listhesis. Vertebrae: Vertebral body height maintained without evidence for acute or chronic fracture. Bone marrow signal intensity diffusely decreased on T1 weighted imaging, nonspecific, but most commonly related to anemia, smoking, or obesity. No discrete or worrisome osseous lesions. No abnormal marrow edema. Cord: Signal intensity within the cervical spinal cord is normal. Posterior Fossa, vertebral arteries, paraspinal tissues: Visualized brain and posterior fossa within normal limits. Craniocervical junction normal.  Paraspinous and prevertebral soft tissues within normal limits. Normal intravascular flow voids seen within the vertebral arteries bilaterally. Disc levels: C2-C3: Unremarkable. C3-C4: Mild disc bulge. Superimposed tiny central disc protrusion minimally indents the ventral thecal sac (series 4, image 9). No significant spinal stenosis or cord deformity. Foramina remain patent. C4-C5: Mild diffuse disc bulge. Mild flattening of the ventral thecal sac without significant spinal stenosis or cord deformity. Foramina remain patent. C5-C6: Mild diffuse disc bulge. Associated small central annular fissure. Flattening of the ventral thecal sac without significant spinal stenosis or cord deformity. Foramina remain patent. C6-C7: Mild diffuse disc bulge with bilateral uncovertebral hypertrophy. Disc bulge slightly asymmetric to the right with flattening of the right ventral thecal sac and minimal flattening of the right hemi cord. Ventral right C7 nerve root could be affected. No significant spinal stenosis. Foramina remain patent. C7-T1:  Unremarkable. MRI THORACIC SPINE FINDINGS Alignment: Physiologic with preservation of the normal thoracic kyphosis. No listhesis. Vertebrae: Vertebral body height maintained without evidence for acute or chronic fracture. Bone marrow signal intensity diffusely decreased on T1 weighted imaging, nonspecific, but most commonly related to anemia, smoking, or obesity. Few scattered subcentimeter benign hemangiomata noted. No worrisome osseous lesions. No abnormal marrow edema. Cord: Signal intensity within the thoracic spinal cord is normal. Normal cord caliber morphology. Paraspinal and other soft tissues: Unremarkable. Disc levels: No significant disc pathology seen within the thoracic spine. No focal disc herniation or significant disc bulge. No canal or neural foraminal stenosis. No impingement. MRI LUMBAR SPINE FINDINGS Segmentation: Standard. Lowest well-formed disc space labeled the L5-S1  level. Alignment: Physiologic with preservation of the normal lumbar lordosis. No listhesis. Vertebrae: Vertebral body height maintained without evidence for acute or chronic fracture. Bone marrow signal intensity diffusely decreased on T1 weighted imaging, nonspecific, but most commonly related to anemia, smoking or obesity. Subcentimeter benign hemangioma noted within the T12 vertebral body. No other discrete or worrisome osseous lesions. No abnormal marrow edema. Conus medullaris and cauda equina: Conus extends to the L2 level. Conus and cauda equina appear normal. Paraspinal and other soft tissues: Paraspinous soft tissues within normal limits. Subcentimeter simple cyst noted within the interpolar right kidney. Visualized visceral structures otherwise unremarkable. Disc levels: L1-2:  Unremarkable. L2-3:  Unremarkable. L3-4: Mild diffuse disc bulge with disc desiccation. No significant spinal stenosis. Foramina remain patent. L4-5: Mild diffuse disc bulge with disc desiccation. Mild bilateral facet  hypertrophy. No canal or lateral recess stenosis. Mild left L4 foraminal narrowing. No significant right foraminal stenosis. L5-S1: Mild disc bulge with disc desiccation. No significant canal or lateral recess stenosis. Foramina remain patent. IMPRESSION: MRI CERVICAL SPINE IMPRESSION: 1. No acute abnormality within the cervical spine. Normal MRI appearance of the cervical spinal cord. 2. Right eccentric disc bulge at C6-7 with secondary mild flattening of the right hemi cord. The ventral right C7 nerve root could be affected. 3. Additional mild noncompressive disc bulging at C3-4 through C5-6 without stenosis or impingement. MRI THORACIC SPINE IMPRESSION: Normal MRI of the thoracic spine and spinal cord. No significant disc pathology, stenosis, or evidence for neural impingement. MRI LUMBAR SPINE IMPRESSION: 1. No acute abnormality within the lumbar spine. 2. Mild disc bulging at L4-5 with resultant mild left L4  foraminal stenosis. 3. Additional mild noncompressive disc bulging at L3-4 and L5-S1 without stenosis or impingement. 4. Diffusely decreased T1 weighted signal intensity throughout the visualized bone marrow, nonspecific, but most commonly related to anemia, smoking, or obesity. Correlation with history and laboratory values recommended. Electronically Signed   By: Jeannine Boga M.D.   On: 05/06/2019 01:19   MR LUMBAR SPINE WO CONTRAST  Result Date: 05/06/2019 CLINICAL DATA:  Initial evaluation for acute right upper extremity numbness, paresthesias. EXAM: MRI CERVICAL, THORACIC AND LUMBAR SPINE WITHOUT CONTRAST TECHNIQUE: Multiplanar and multiecho pulse sequences of the cervical spine, to include the craniocervical junction and cervicothoracic junction, and thoracic and lumbar spine, were obtained without intravenous contrast. COMPARISON:  None. FINDINGS: MRI CERVICAL SPINE FINDINGS Alignment: Reversal of the normal cervical lordosis without listhesis. Vertebrae: Vertebral body height maintained without evidence for acute or chronic fracture. Bone marrow signal intensity diffusely decreased on T1 weighted imaging, nonspecific, but most commonly related to anemia, smoking, or obesity. No discrete or worrisome osseous lesions. No abnormal marrow edema. Cord: Signal intensity within the cervical spinal cord is normal. Posterior Fossa, vertebral arteries, paraspinal tissues: Visualized brain and posterior fossa within normal limits. Craniocervical junction normal. Paraspinous and prevertebral soft tissues within normal limits. Normal intravascular flow voids seen within the vertebral arteries bilaterally. Disc levels: C2-C3: Unremarkable. C3-C4: Mild disc bulge. Superimposed tiny central disc protrusion minimally indents the ventral thecal sac (series 4, image 9). No significant spinal stenosis or cord deformity. Foramina remain patent. C4-C5: Mild diffuse disc bulge. Mild flattening of the ventral thecal sac  without significant spinal stenosis or cord deformity. Foramina remain patent. C5-C6: Mild diffuse disc bulge. Associated small central annular fissure. Flattening of the ventral thecal sac without significant spinal stenosis or cord deformity. Foramina remain patent. C6-C7: Mild diffuse disc bulge with bilateral uncovertebral hypertrophy. Disc bulge slightly asymmetric to the right with flattening of the right ventral thecal sac and minimal flattening of the right hemi cord. Ventral right C7 nerve root could be affected. No significant spinal stenosis. Foramina remain patent. C7-T1:  Unremarkable. MRI THORACIC SPINE FINDINGS Alignment: Physiologic with preservation of the normal thoracic kyphosis. No listhesis. Vertebrae: Vertebral body height maintained without evidence for acute or chronic fracture. Bone marrow signal intensity diffusely decreased on T1 weighted imaging, nonspecific, but most commonly related to anemia, smoking, or obesity. Few scattered subcentimeter benign hemangiomata noted. No worrisome osseous lesions. No abnormal marrow edema. Cord: Signal intensity within the thoracic spinal cord is normal. Normal cord caliber morphology. Paraspinal and other soft tissues: Unremarkable. Disc levels: No significant disc pathology seen within the thoracic spine. No focal disc herniation or significant disc bulge. No canal or neural  foraminal stenosis. No impingement. MRI LUMBAR SPINE FINDINGS Segmentation: Standard. Lowest well-formed disc space labeled the L5-S1 level. Alignment: Physiologic with preservation of the normal lumbar lordosis. No listhesis. Vertebrae: Vertebral body height maintained without evidence for acute or chronic fracture. Bone marrow signal intensity diffusely decreased on T1 weighted imaging, nonspecific, but most commonly related to anemia, smoking or obesity. Subcentimeter benign hemangioma noted within the T12 vertebral body. No other discrete or worrisome osseous lesions. No  abnormal marrow edema. Conus medullaris and cauda equina: Conus extends to the L2 level. Conus and cauda equina appear normal. Paraspinal and other soft tissues: Paraspinous soft tissues within normal limits. Subcentimeter simple cyst noted within the interpolar right kidney. Visualized visceral structures otherwise unremarkable. Disc levels: L1-2:  Unremarkable. L2-3:  Unremarkable. L3-4: Mild diffuse disc bulge with disc desiccation. No significant spinal stenosis. Foramina remain patent. L4-5: Mild diffuse disc bulge with disc desiccation. Mild bilateral facet hypertrophy. No canal or lateral recess stenosis. Mild left L4 foraminal narrowing. No significant right foraminal stenosis. L5-S1: Mild disc bulge with disc desiccation. No significant canal or lateral recess stenosis. Foramina remain patent. IMPRESSION: MRI CERVICAL SPINE IMPRESSION: 1. No acute abnormality within the cervical spine. Normal MRI appearance of the cervical spinal cord. 2. Right eccentric disc bulge at C6-7 with secondary mild flattening of the right hemi cord. The ventral right C7 nerve root could be affected. 3. Additional mild noncompressive disc bulging at C3-4 through C5-6 without stenosis or impingement. MRI THORACIC SPINE IMPRESSION: Normal MRI of the thoracic spine and spinal cord. No significant disc pathology, stenosis, or evidence for neural impingement. MRI LUMBAR SPINE IMPRESSION: 1. No acute abnormality within the lumbar spine. 2. Mild disc bulging at L4-5 with resultant mild left L4 foraminal stenosis. 3. Additional mild noncompressive disc bulging at L3-4 and L5-S1 without stenosis or impingement. 4. Diffusely decreased T1 weighted signal intensity throughout the visualized bone marrow, nonspecific, but most commonly related to anemia, smoking, or obesity. Correlation with history and laboratory values recommended. Electronically Signed   By: Jeannine Boga M.D.   On: 05/06/2019 01:19        Rudene Re,  MD 05/06/19 [phone]

## 2019-06-01 DIAGNOSIS — M48061 Spinal stenosis, lumbar region without neurogenic claudication: Secondary | ICD-10-CM | POA: Insufficient documentation

## 2019-06-01 DIAGNOSIS — M5442 Lumbago with sciatica, left side: Secondary | ICD-10-CM | POA: Insufficient documentation

## 2019-09-18 ENCOUNTER — Other Ambulatory Visit: Payer: Self-pay | Admitting: Physical Medicine & Rehabilitation

## 2019-09-18 DIAGNOSIS — M5441 Lumbago with sciatica, right side: Secondary | ICD-10-CM

## 2019-09-18 DIAGNOSIS — G8929 Other chronic pain: Secondary | ICD-10-CM

## 2019-09-27 ENCOUNTER — Ambulatory Visit: Payer: Medicaid Other

## 2019-10-16 ENCOUNTER — Ambulatory Visit: Admission: RE | Admit: 2019-10-16 | Payer: Medicaid Other | Source: Ambulatory Visit

## 2019-10-31 ENCOUNTER — Ambulatory Visit: Payer: Medicaid Other

## 2019-11-28 ENCOUNTER — Other Ambulatory Visit: Payer: Self-pay

## 2019-11-28 ENCOUNTER — Emergency Department
Admission: EM | Admit: 2019-11-28 | Discharge: 2019-11-28 | Disposition: A | Discharge status: Home / Self Care | Payer: Medicaid Other | Attending: Emergency Medicine | Admitting: Emergency Medicine

## 2019-11-28 DIAGNOSIS — R1032 Left lower quadrant pain: Secondary | ICD-10-CM | POA: Diagnosis not present

## 2019-11-28 DIAGNOSIS — Z5321 Procedure and treatment not carried out due to patient leaving prior to being seen by health care provider: Secondary | ICD-10-CM | POA: Diagnosis not present

## 2019-11-28 LAB — CBC
HCT: 38.3 % (ref 36.0–46.0)
Hemoglobin: 12.7 g/dL (ref 12.0–15.0)
MCH: 30.3 pg (ref 26.0–34.0)
MCHC: 33.2 g/dL (ref 30.0–36.0)
MCV: 91.4 fL (ref 80.0–100.0)
Platelets: 378 10*3/uL (ref 150–400)
RBC: 4.19 MIL/uL (ref 3.87–5.11)
RDW: 12.8 % (ref 11.5–15.5)
WBC: 8.4 10*3/uL (ref 4.0–10.5)
nRBC: 0 % (ref 0.0–0.2)

## 2019-11-28 LAB — COMPREHENSIVE METABOLIC PANEL
ALT: 17 U/L (ref 0–44)
AST: 17 U/L (ref 15–41)
Albumin: 4 g/dL (ref 3.5–5.0)
Alkaline Phosphatase: 59 U/L (ref 38–126)
Anion gap: 8 (ref 5–15)
BUN: 11 mg/dL (ref 6–20)
CO2: 26 mmol/L (ref 22–32)
Calcium: 8.8 mg/dL — ABNORMAL LOW (ref 8.9–10.3)
Chloride: 108 mmol/L (ref 98–111)
Creatinine, Ser: 1.03 mg/dL — ABNORMAL HIGH (ref 0.44–1.00)
GFR, Estimated: >60 mL/min (ref >60)
Glucose, Bld: 100 mg/dL — ABNORMAL HIGH (ref 70–99)
Potassium: 4 mmol/L (ref 3.5–5.1)
Sodium: 142 mmol/L (ref 135–145)
Total Bilirubin: 0.5 mg/dL (ref 0.3–1.2)
Total Protein: 7.6 g/dL (ref 6.5–8.1)

## 2019-11-28 LAB — LIPASE, BLOOD: Lipase: 40 U/L (ref 11–51)

## 2019-11-28 NOTE — ED Triage Notes (Signed)
Pt comes POV with left lower abdominal pain especially when having a BM for about 2 days. Pt states regular BMs with no straining. Pt hypertensive but states took BP meds today.

## 2019-11-28 NOTE — ED Notes (Signed)
Pt has not returned

## 2019-11-28 NOTE — ED Notes (Signed)
Pt noted leaving ED lobby, getting into vehicle and leaving

## 2019-11-29 ENCOUNTER — Telehealth: Payer: Self-pay | Admitting: Emergency Medicine

## 2019-11-29 NOTE — Telephone Encounter (Signed)
Called patient due to left emergency department before provider exam to inquire about condition and follow up plans. She has not called her doctor yet, but she agrees to do so now.

## 2019-12-02 ENCOUNTER — Emergency Department
Admission: EM | Admit: 2019-12-02 | Discharge: 2019-12-02 | Disposition: A | Discharge status: Home / Self Care | Payer: Medicaid Other | Attending: Emergency Medicine | Admitting: Emergency Medicine

## 2019-12-02 ENCOUNTER — Emergency Department: Payer: Medicaid Other

## 2019-12-02 ENCOUNTER — Other Ambulatory Visit: Payer: Self-pay

## 2019-12-02 ENCOUNTER — Encounter: Payer: Self-pay | Admitting: Emergency Medicine

## 2019-12-02 DIAGNOSIS — Z79899 Other long term (current) drug therapy: Secondary | ICD-10-CM | POA: Diagnosis not present

## 2019-12-02 DIAGNOSIS — J45909 Unspecified asthma, uncomplicated: Secondary | ICD-10-CM | POA: Insufficient documentation

## 2019-12-02 DIAGNOSIS — I10 Essential (primary) hypertension: Secondary | ICD-10-CM | POA: Diagnosis not present

## 2019-12-02 DIAGNOSIS — R1031 Right lower quadrant pain: Secondary | ICD-10-CM

## 2019-12-02 DIAGNOSIS — F17210 Nicotine dependence, cigarettes, uncomplicated: Secondary | ICD-10-CM | POA: Insufficient documentation

## 2019-12-02 DIAGNOSIS — N949 Unspecified condition associated with female genital organs and menstrual cycle: Secondary | ICD-10-CM

## 2019-12-02 DIAGNOSIS — Z8541 Personal history of malignant neoplasm of cervix uteri: Secondary | ICD-10-CM | POA: Diagnosis not present

## 2019-12-02 DIAGNOSIS — N83201 Unspecified ovarian cyst, right side: Secondary | ICD-10-CM | POA: Diagnosis not present

## 2019-12-02 LAB — CBC
HCT: 36.5 % (ref 36.0–46.0)
Hemoglobin: 12.3 g/dL (ref 12.0–15.0)
MCH: 30.4 pg (ref 26.0–34.0)
MCHC: 33.7 g/dL (ref 30.0–36.0)
MCV: 90.1 fL (ref 80.0–100.0)
Platelets: 382 10*3/uL (ref 150–400)
RBC: 4.05 MIL/uL (ref 3.87–5.11)
RDW: 12.7 % (ref 11.5–15.5)
WBC: 6.8 10*3/uL (ref 4.0–10.5)
nRBC: 0 % (ref 0.0–0.2)

## 2019-12-02 LAB — URINALYSIS, COMPLETE (UACMP) WITH MICROSCOPIC
Bilirubin Urine: NEGATIVE
Glucose, UA: NEGATIVE mg/dL
Hgb urine dipstick: NEGATIVE
Ketones, ur: NEGATIVE mg/dL
Leukocytes,Ua: NEGATIVE
Nitrite: NEGATIVE
Protein, ur: NEGATIVE mg/dL
Specific Gravity, Urine: 1.021 (ref 1.005–1.030)
pH: 6 (ref 5.0–8.0)

## 2019-12-02 LAB — COMPREHENSIVE METABOLIC PANEL
ALT: 16 U/L (ref 0–44)
AST: 17 U/L (ref 15–41)
Albumin: 4.2 g/dL (ref 3.5–5.0)
Alkaline Phosphatase: 62 U/L (ref 38–126)
Anion gap: 10 (ref 5–15)
BUN: 13 mg/dL (ref 6–20)
CO2: 26 mmol/L (ref 22–32)
Calcium: 9 mg/dL (ref 8.9–10.3)
Chloride: 103 mmol/L (ref 98–111)
Creatinine, Ser: 0.93 mg/dL (ref 0.44–1.00)
GFR, Estimated: >60 mL/min (ref >60)
Glucose, Bld: 86 mg/dL (ref 70–99)
Potassium: 4 mmol/L (ref 3.5–5.1)
Sodium: 139 mmol/L (ref 135–145)
Total Bilirubin: 0.6 mg/dL (ref 0.3–1.2)
Total Protein: 7.8 g/dL (ref 6.5–8.1)

## 2019-12-02 LAB — LIPASE, BLOOD: Lipase: 37 U/L (ref 11–51)

## 2019-12-02 MED ORDER — KETOROLAC TROMETHAMINE 30 MG/ML IJ SOLN
30.0000 mg | Freq: Once | INTRAMUSCULAR | Status: AC
  Administered 2019-12-02: 30 mg via INTRAMUSCULAR
  Filled 2019-12-02: qty 1

## 2019-12-02 MED ORDER — HYDROCODONE-ACETAMINOPHEN 5-325 MG PO TABS
1.0000 | ORAL_TABLET | Freq: Once | ORAL | Status: AC
  Administered 2019-12-02: 1 via ORAL
  Filled 2019-12-02: qty 1

## 2019-12-02 NOTE — Discharge Instructions (Signed)
Please

## 2019-12-02 NOTE — ED Provider Notes (Signed)
[facility] Emergency Department Provider Note   ____________________________________________    I have reviewed the triage vital signs and the nursing notes.   HISTORY  Chief Complaint Abdominal Pain and Ankle Pain     HPI Cassidy Mathis is a 44 y.o. female who presents with multiple complaints, primarily concerned about right lower quadrant abdominal pain which has been intermittently sharp for the last 5 days or so.  She reports she has had loose stools chronically as well and a sharp pain which seems to occur when she has a bowel movement only.  Denies fevers chills nausea or vomiting.  She has had a hysterectomy with salpingectomy reportedly.  Has had a cholecystectomy.  Has not take anything for this.  No sick contacts.  Past Medical History:  Diagnosis Date  . Allergy    seasonal  . Anemia   . Asthma   . Cancer ([place])   . Depression   . GERD (gastroesophageal reflux disease)    RARE-NO MEDS  . Headache    MIGRAINES  . Hypertension    PT STATES SHE IS SUPPOSED TO BE TAKING LISINOPRIL-HCTZ BUT HAS BEEN OUT "FOR A WHILE" NEEDS TO GET ANOTHER PRESCRIPTION FROM HER PCP  . Pancreatitis   . Pilonidal cyst    TOOK ANTIBIOTIC THIS MONTH AND IT IS RESOLVED    Patient Active Problem List   Diagnosis Date Noted  . Cervical cancer, FIGO stage IB1 ([place]) 05/17/2017  . Ileus ([place]) 04/18/2017  . Postoperative anemia due to acute blood loss 04/18/2017  . Tobacco use 04/06/2017  . 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 Common bile duct stone   . Pancreatitis 12/16/2016  . 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 Incarcerated epigastric hernia     Past Surgical History:  Procedure Laterality Date  . ABDOMINAL HYSTERECTOMY    . CERVICAL CONIZATION W/BX N/A 03/23/2017   Procedure: CONIZATION CERVIX WITH BIOPSY;  Surgeon: Gillis Ends, MD;  Location: [facility] ORS;  Service: Gynecology;  Laterality: N/A;    . CHOLECYSTECTOMY  10/21/2015   Procedure: LAPAROSCOPIC CHOLECYSTECTOMY WITH INTRAOPERATIVE CHOLANGIOGRAM;  Surgeon: Jules Husbands, MD;  Location: [facility] ORS;  Service: General;;  . CHOLECYSTECTOMY Bilateral   . CYST EXCISION     tongue AND WRIST  . EPIGASTRIC HERNIA REPAIR N/A 04/18/2015   Procedure: HERNIA REPAIR EPIGASTRIC ADULT;  Surgeon: Jules Husbands, MD;  Location: [facility] ORS;  Service: General;  Laterality: N/A;  . ERCP N/A 12/21/2016   Procedure: ENDOSCOPIC RETROGRADE CHOLANGIOPANCREATOGRAPHY (ERCP);  Surgeon: [name], MD;  Location: [facility] END[facility]COPY;  Service: Endoscopy;  Laterality: N/A;  . HERNIA REPAIR    . RADICAL HYSTERECTOMY  04/14/2017  . SALPINGECTOMY Bilateral 04/14/2017  . SENTINEL LYMPH NODE BIOPSY    . TUBAL LIGATION      Prior to Admission medications   Medication Sig Start Date End Date Taking? Authorizing Provider  albuterol (PROVENTIL HFA;VENTOLIN HFA) 108 (90 Base) MCG/ACT inhaler Inhale 2 puffs into the lungs every 6 (six) hours as needed for wheezing or shortness of breath. 02/28/18   Merlyn Lot, MD  amLODipine (NORVASC) 5 MG tablet TAKE 1 TABLET(5 MG) BY MOUTH DAILY AFTER LUNCH 08/06/18   Sowles, Drue Stager, MD  fexofenadine-pseudoephedrine (ALLEGRA-D) 60-120 MG 12 hr tablet Take 1 tablet by mouth 2 (two) times daily. 02/23/18   Sable Feil, PA-C  lisinopril (PRINIVIL,ZESTRIL) 20 MG tablet Take 1 tablet (20 mg total) by mouth daily. 11/16/17  Hubbard Hartshorn, FNP     Allergies Patient has no known allergies.  Family History  Problem Relation Age of Onset  . Hypertension Mother   . Gallstones Mother   . Hypertension Father   . Gout Father   . Cancer Paternal Uncle        lung  . Cancer Maternal Grandmother        not sure what type of cancer  . Cancer Maternal Grandfather        not sure what type of cancer  . Cancer Paternal Grandmother        not sure what type of cancer  . Cancer Paternal Grandfather        not sure what type of cancer     Social History Social History   Tobacco Use  . Smoking status: Current Every Day Smoker    Packs/day: 0.50    Years: 12.00    Pack years: 6.00    Types: Cigarettes  . Smokeless tobacco: Never Used  Vaping Use  . Vaping Use: Never used  Substance Use Topics  . Alcohol use: No    Alcohol/week: 0.0 standard drinks  . Drug use: Yes    Types: Marijuana    Comment: "not often"    Review of Systems  Constitutional: No fever/chills Eyes: No visual changes.  ENT: No sore throat. Cardiovascular: Denies chest pain. Respiratory: Denies shortness of breath. Gastrointestinal as above Genitourinary: No significant dysuria Musculoskeletal: Negative for back pain. Skin: Negative for rash. Neurological: Negative for headaches or weakness   ____________________________________________   PHYSICAL EXAM:  VITAL SIGNS: ED Triage Vitals [12/02/19 1652]  Enc Vitals Group     BP (!) 171/90     Pulse Rate 72     Resp 18     Temp 98.3 F (36.8 C)     Temp Source Oral     SpO2 99 %     Weight 91.6 kg (202 lb)     Height 1.651 m (5\' 5" )     Head Circumference      Peak Flow      Pain Score 8     Pain Loc      Pain Edu?      Excl. in [place]?     Constitutional: Alert and oriented. No acute distress.  Nose: No congestion/rhinnorhea. Mouth/Throat: Mucous membranes are moist.   Neck:  Painless ROM Cardiovascular: Normal rate, regular rhythm. Grossly normal heart sounds.  Good peripheral circulation. Respiratory: Normal respiratory effort.  No retractions. Lungs CTAB. Gastrointestinal: Soft and nontender. No distention.  No CVA tenderness. Musculoskeletal:   Warm and well perfused Neurologic:  Normal speech and language. No gross focal neurologic deficits are appreciated.  Skin:  Skin is warm, dry and intact. No rash noted. Psychiatric: Mood and affect are normal. Speech and behavior are normal.  ____________________________________________   LABS (all labs ordered are  listed, but only abnormal results are displayed)  Labs Reviewed  URINALYSIS, COMPLETE (UACMP) WITH MICROSCOPIC - Abnormal; Notable for the following components:      Result Value   Color, Urine YELLOW (*)    APPearance HAZY (*)    Bacteria, UA RARE (*)    All other components within normal limits  LIPASE, BLOOD  COMPREHENSIVE METABOLIC PANEL  CBC  OVARIAN MALIGNANCY RISK-ROMA   ____________________________________________  EKG   None ____________________________________________  RADIOLOGY CT renal stone study reviewed by me  ____________________________________________   PROCEDURES  Procedure(s) performed: No  Procedures  Critical Care performed: No ____________________________________________   INITIAL IMPRESSION / ASSESSMENT AND PLAN / ED COURSE  Pertinent labs & imaging results that were available during my care of the patient were reviewed by me and considered in my medical decision making (see chart for details).  Patient presents with right lower quadrant abdominal pain as described above.  Differential includes appendicitis although unlikely given time course, kidney stone, colitis  Lab work is quite reassuring, normal white blood cell count, no evidence of hemoglobin on urinalysis, not consistent with UTI.  We will obtain CT renal stone study, treat with IM Toradol.  CT demonstrates right-sided ovarian cyst, ultrasound obtained for further characterization, septated large cyst. Discussed with [place] on-call, requested [place] blood sent, outpatient follow-up    ____________________________________________   FINAL CLINICAL IMPRESSION(S) / ED DIAGNOSES  Final diagnoses:  Cyst of right ovary        Note:  This document was prepared using Dragon voice recognition software and may include unintentional dictation errors.   Lavonia Drafts, MD 12/02/19 2126

## 2019-12-02 NOTE — ED Triage Notes (Addendum)
Pt arrived via POV with reports of lower abdominal pain, denies any dysuria pt also states she is having abdominal pain when she has a BM.  Pt also c/o L lateral ankle pain, states painful with palpation. Was supposed to be referred to someone for eval but hasn't heard back yet.

## 2019-12-02 NOTE — Consult Note (Signed)
Consult Note   SERVICE: Gynecology  Patient Name: Cassidy Mathis Patient MRN:   [id number]  CC: worsening RLQ pain x 1 week  HPI: Cassidy Mathis is a 44 y.o. presenting to the ER with c/o RLQ pain worsening over last 5-7days. Reports midline pelvic pain associated with BM- severe at times, causing her to double over in pain; and now RLQ that is also affecting her ability to sleep. Reports occasional uncomfortable intercourse. Reports frequency of BM with diarrhea and stool leakage at times.  - pt with complex Gyn history detailed below.  - Gyn requested to evaluate pt by ER physician.  - Pt last seen by Dr Ouida Sills early 2019  Review of Systems: positives in bold GEN:   fevers, chills, weight changes, appetite changes, fatigue, night sweats HEENT:  HA, vision changes, hearing loss, congestion, rhinorrhea, sinus pressure, dysphagia CV:   CP, palpitations PULM:  SOB, cough GI:  abd pain, N/V/D/C GU:  dysuria, urgency, frequency; no vaginal bleeding or discharge.  MSK:  arthralgias, myalgias, back pain, swelling SKIN:  rashes, color changes, pallor NEURO:  numbness, weakness, tingling, seizures, dizziness, tremors PSYCH:  depression, anxiety, behavioral problems, confusion  HEME/LYMPH:  easy bruising or bleeding ENDO:  heat/cold intolerance  Past Obstetrical History: OB History   No obstetric history on file.     Past Gynecologic History: Patient's last menstrual period was 03/18/2017 (exact date).   GYN Oncology History:  Patient was initially seen in consultation for cervical cancer. Pap on 10/17 LSIL + HP HPV.  Failed to show for colposcopy appointment at that time.   Repeat Pap 12/18 ASCUS, + HR HPV 03/03/17--colposcopy with Dr. Schermerhorn-acetowhite epithelium at 12:00, 4:00, 7:00, 10:00 with punctuation at 7:00. Mosaicism at 4:00. Biopsy and ECC performed. - Bxs from ectocervix at 10, 12 and ECC well diff squamous cell cancer. Size of biopsies too small to  determine depth of invasion.   - Pelvic on 03/16/17 with Dr. Fransisca Connors revealed no grossly visible exophytic lesion but cervix was hard and suspect significant invasion.  -03/23/17-exam under anesthesia, cold knife cone, and endocervical curettage.  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   - 06/2018: seen by Gyn-Onc, CT abd pelvis: 6.8x4.3 right ovoid fluid collection in right adnexa.     Past Medical History: Past Medical History:  Diagnosis Date   Allergy    seasonal   Anemia    Asthma    Cancer ([place])    Depression    GERD (gastroesophageal reflux disease)    RARE-NO MEDS   Headache    MIGRAINES   Hypertension    PT STATES SHE IS SUPPOSED TO BE TAKING LISINOPRIL-HCTZ BUT HAS BEEN OUT "FOR A WHILE" NEEDS TO GET ANOTHER PRESCRIPTION FROM HER PCP   Pancreatitis    Pilonidal cyst    TOOK ANTIBIOTIC THIS MONTH AND IT IS RESOLVED    Past Surgical History:   Past Surgical History:  Procedure Laterality Date   ABDOMINAL HYSTERECTOMY     CERVICAL CONIZATION W/BX N/A 03/23/2017   Procedure: CONIZATION CERVIX WITH BIOPSY;  Surgeon: Theora Gianotti, [place]  Philbert Riser, MD;  Location: [facility] ORS;  Service: Gynecology;  Laterality: N/A;   CHOLECYSTECTOMY  10/21/2015   Procedure: LAPAROSCOPIC CHOLECYSTECTOMY WITH INTRAOPERATIVE CHOLANGIOGRAM;  Surgeon: Jules Husbands, MD;  Location: [facility] ORS;  Service: General;;   CHOLECYSTECTOMY Bilateral    CYST EXCISION     tongue AND WRIST   EPIGASTRIC HERNIA REPAIR N/A  04/18/2015   Procedure: HERNIA REPAIR EPIGASTRIC ADULT;  Surgeon: Jules Husbands, MD;  Location: [facility] ORS;  Service: General;  Laterality: N/A;   ERCP N/A 12/21/2016   Procedure: ENDOSCOPIC RETROGRADE CHOLANGIOPANCREATOGRAPHY (ERCP);  Surgeon: [name], MD;  Location: [facility] ENDOSCOPY;  Service: Endoscopy;  Laterality: N/A;   HERNIA REPAIR     RADICAL HYSTERECTOMY  04/14/2017   SALPINGECTOMY Bilateral 04/14/2017   SENTINEL LYMPH NODE BIOPSY     TUBAL LIGATION      Family History:  family history includes Cancer in her maternal grandfather, maternal grandmother, paternal grandfather, paternal grandmother, and paternal uncle; Gallstones in her mother; Gout in her father; Hypertension in her father and mother.  Social History:  Social History   Occupational History   Not on file  Tobacco Use   Smoking status: Current Every Day Smoker    Packs/day: 0.50    Years: 12.00    Pack years: 6.00    Types: Cigarettes   Smokeless tobacco: Never Used  Scientific laboratory technician Use: Never used  Substance and Sexual Activity   Alcohol use: No    Alcohol/week: 0.0 standard drinks   Drug use: Yes    Types: Marijuana    Comment: "not often"   Sexual activity: Yes    Partners: Male    Home Medications:  Medications reconciled in EPIC  No current facility-administered medications on file prior to encounter.   Current Outpatient Medications on File Prior to Encounter  Medication Sig Dispense Refill   albuterol (PROVENTIL HFA;VENTOLIN HFA) 108 (90 Base) MCG/ACT inhaler Inhale 2 puffs into the lungs every 6 (six) hours as needed for wheezing or shortness of breath. 1 Inhaler 2   amLODipine (NORVASC) 5 MG tablet TAKE 1 TABLET(5 MG) BY MOUTH DAILY AFTER LUNCH 90 tablet 0   fexofenadine-pseudoephedrine (ALLEGRA-D) 60-120 MG 12 hr tablet Take 1 tablet by mouth 2 (two) times daily. 20 tablet 0   lisinopril (PRINIVIL,ZESTRIL) 20 MG tablet Take 1 tablet (20 mg total) by mouth daily. 90  tablet 1    Allergies:  No Known Allergies  Physical Exam:  Temp:  [98.1 F (36.7 C)-98.4 F (36.9 C)] 98.1 F (36.7 C) (11/07 2147) Pulse Rate:  [69-75] 69 (11/07 2147) Resp:  [16-18] 16 (11/07 2147) BP: (171-189)/(90-109) 179/109 (11/07 2147) SpO2:  [97 %-99 %] 98 % (11/07 2147) Weight:  [91.6 kg] 91.6 kg (11/07 1652)   General Appearance:  Well developed, well nourished, no acute distress, alert and oriented x3 HEENT:  Normocephalic atraumatic, extraocular movements intact,  Abdomen:  Bowel sounds present, soft, nondistended, no epigastric pain; midline lower and RLQ tenderness to palpation Extremities:  Full range of motion, no pedal edema, 2+ distal pulses, no tenderness Skin:  normal coloration and turgor, no rashes, no suspicious skin lesions noted; abdominal scars noted: midline vertical above umbilicus, pfannienstiel, and umbilical port scar.  Psychiatric:  Normal mood and affect, appropriate, no AH/VH Pelvic:  deferred   Labs/Studies:   CBC and Coags:  Lab Results  Component Value Date   WBC 6.8 12/02/2019   NEUTOPHILPCT 59 05/05/2019   EOSPCT 2 05/05/2019   BASOPCT  0 05/05/2019   LYMPHOPCT 35 05/05/2019   HGB 12.3 12/02/2019   HCT 36.5 12/02/2019   MCV 90.1 12/02/2019   PLT 382 12/02/2019   INR 0.93 03/22/2017   CMP:  Lab Results  Component Value Date   NA 139 12/02/2019   K 4.0 12/02/2019   CL 103 12/02/2019   CO2 26 12/02/2019   BUN 13 12/02/2019   CREATININE 0.93 12/02/2019   CREATININE 1.03 (H) 11/28/2019   CREATININE 0.87 05/05/2019   PROT 7.8 12/02/2019   BILITOT 0.6 12/02/2019   BILIDIR 0.05 01/04/2018   ALT 16 12/02/2019   AST 17 12/02/2019   ALKPHOS 62 12/02/2019     Other Imaging: CT Renal Stone Study  Addendum Date: 12/02/2019   ADDENDUM REPORT: 12/02/2019 20:06 ADDENDUM: These results were called by telephone at the time of interpretation on 12/02/2019 at 8:06 pm to provider Lavonia Drafts , who verbally acknowledged these results.  Electronically Signed   By: Lovena Le M.D.   On: 12/02/2019 20:06   Result Date: 12/02/2019 CLINICAL DATA:  Flank pain, stone disease suspected EXAM: CT ABDOMEN AND PELVIS WITHOUT CONTRAST TECHNIQUE: Multidetector CT imaging of the abdomen and pelvis was performed following the standard protocol without IV contrast. COMPARISON:  CT 07/07/2018 FINDINGS: Lower chest: Lung bases are clear. Normal heart size. No pericardial effusion. Hepatobiliary: No visible focal liver lesion within the limitations of unenhanced CT. Normal liver attenuation. Smooth liver surface contour. Post cholecystectomy without significant biliary dilatation or intraductal gallstones. Pancreas: No pancreatic ductal dilatation or surrounding inflammatory changes. Spleen: Normal in size. No concerning splenic lesions. Adrenals/Urinary Tract: Intermediate attenuation 1.3 cm adrenal nodule in the body of the left adrenal gland best seen on coronal imaging, similar to comparison in 2020 and likely reflecting a benign adrenal adenoma. No other focal or concerning adrenal lesions. Kidneys are symmetric in size and normally located. There is slight asymmetric fullness of the right renal pelvis without frank hydronephrosis. This is possibly related to compression of the distal right ureter as it courses in the vicinity the cystic lesion in the right adnexa. No visible urolithiasis or hydronephrosis is seen. Urinary bladder is unremarkable aside from some postsurgical changes anteriorly. Stomach/Bowel: Distal esophagus, stomach and duodenal sweep are unremarkable. No small bowel wall thickening or dilatation. No evidence of obstruction. A normal appendix is visualized. No colonic dilatation or wall thickening. Vascular/Lymphatic: Minimal atherosclerotic calcifications within the abdominal aorta and branch vessels. No aneurysm or ectasia. No enlarged abdominopelvic lymph nodes. Few top-normal 8-9 mm inguinal nodes bilaterally are unchanged from prior  with preserved nodal architecture. Reproductive: Enlarging low low-attenuation cystic lesion arising in the right adnexa likely from a retained right ovary now measuring up to 10.5 x 7.7 by 9.0 cm, previously 4.5 x 4.6 x 6.2 cm when measured at similar levels on comparison imaging. Possible internal septations, difficult to fully delineate on this noncontrast CT. Quiescent appearance of the left adnexal tissue. Reportedly post hysterectomy and bilateral salpingectomy with surgical clips along the pelvic sidewalls. Other: No abdominopelvic free air or fluid. No bowel containing hernia. Remote postsurgical changes along the low anterior pelvic wall. Musculoskeletal: No acute osseous abnormality or suspicious osseous lesion. IMPRESSION: 1. Enlarging low-attenuation cystic lesion arising in the right adnexa likely from a retained right ovary now measuring up to 10.5 x 7.7 x 9.0 cm, previously 4.5 x 4.6 x 6.2 cm when measured at similar levels on comparison imaging. Recommend further evaluation with pelvic ultrasound. Because this lesion is not adequately  characterized, prompt [place] is recommended for further evaluation. Reference: JACR 2020 Feb; 17(2):248-254 2. Slight asymmetric fullness of the right renal pelvis without frank hydronephrosis. This is possibly related to compression of the distal right ureter as it courses in the vicinity the cystic lesion in the right adnexa. No visible urolithiasis or hydronephrosis is seen. 3. 1.3 cm intermediate attenuation adrenal nodule in the body of the left adrenal gland, similar to comparison in comparison in 2020 and likely reflecting a benign adrenal adenoma though could warrant additional evaluation based on the initial impression Point above, otherwise follow-up washout CT in 1 years time could be obtained. This recommendation follows ACR consensus guidelines: Management of Incidental Adrenal Masses: A White Paper of the ACR Incidental Findings Committee. J Am Coll Radiol  2017;14:1038-1044. 4. Aortic Atherosclerosis (ICD10-I70.0). Electronically Signed: By: Lovena Le M.D. On: 12/02/2019 19:58   US PELVIC COMPLETE WITH TRANSVAGINAL  Result Date: 12/02/2019 CLINICAL DATA:  Follow-up examination for right adnexal cyst. History of prior hysterectomy. EXAM: TRANSABDOMINAL AND TRANSVAGINAL ULTRASOUND OF PELVIS TECHNIQUE: Both transabdominal and transvaginal ultrasound examinations of the pelvis were performed. Transabdominal technique was performed for global imaging of the pelvis including uterus, ovaries, adnexal regions, and pelvic cul-de-sac. It was necessary to proceed with endovaginal exam following the transabdominal exam to visualize the prior CT from earlier the same day. COMPARISON:  Prior CT from earlier same day. FINDINGS: Uterus Surgically absent.  No abnormality about the vaginal cuff. Endometrium Surgically absent. Right ovary The native right ovary is not well characterized. Large complex cystic mass measuring 11.9 x 9.2 x 7.5 cm seen within the right adnexa, corresponding with abnormality on prior CT. Lesion demonstrates at least 1 internal septation, with additional scattered low-level echoes seen elsewhere within the cystic component. No appreciable solid nodularity. Arterial and venous flow is seen along the periphery of this lesion, with scant vascularity along the internal septation itself. Left ovary Not visualized.  No left adnexal mass. Other findings No abnormal free fluid. IMPRESSION: 1. 11.9 x 9.2 x 7.5 cm complex cystic mass with internal septation, corresponding with abnormality on prior CT. Finding is indeterminate, but could reflect a cystic ovarian neoplasm. Peritoneal inclusion cyst could also be considered given the history of prior surgery. Gynecologic referral for further workup and surgical consultation recommended. 2. Nonvisualization of the left ovary. No left adnexal mass or free fluid. 3. Prior hysterectomy. Electronically Signed   By:  Jeannine Boga M.D.   On: 12/02/2019 21:02     Assessment / Plan:   Cassidy Mathis is a 44 y.o. No obstetric history on file. who presents with RLQ pain  1. Right adnexal mass: complex ovarian cyst noted on CT and TVUS pelvic imaging.  - examined and discussed findings with Dr Leafy Ro via telephone - pt does not appear to be in any acute distress and is appropriate for close in-office followup at [facility] - Per Dr Leafy Ro, ROMA score ordered; Ca125 and HE4 labs to be done prior to DC from ER.   Thank you for the opportunity to be involved with this pt's care.   Francetta Found, CNM [place] Certified Nurse Midwife 12/02/2019 10:14 PM

## 2019-12-02 NOTE — ED Notes (Signed)
Pt transported to US

## 2019-12-07 NOTE — H&P (Signed)
Cassidy Mathis is a 44 y.o. female here for L/S RSO and possible LOA  . Pt here for Ed follow up from [facility]  . Seen last month  ago with a 1 week h/o right sided pelvic pain . Marland Kitchenctscan  And u/s reviewed and visualized form [facility] . Marland Kitchen Right ovarian mass 11.9 x 9,2 cm . Anechoic with thin septation coursing through . Pt with a h/o cervical cancer Prior [facility]C and subsequent L/S converted to  TAH withBilateral pelvic sentinel lymph node mapping Left pelvic sentinel lymph node biopsies x 2 Right pelvic lymph node dissection Bilateral salpingectomy Radical hysterectomy Cystoscopy  Pt also c/o Dyschezia before defecation and during . BM 1-2 q day . Some recent blood in stool .  No sexual activity in the last 30 days  And before should would have dyspareunia only if she was having the dyschezia type pain      Past Medical History:  has a past medical history of Abnormal Pap smear of cervix (11/10/2015), Allergic rhinitis, Anemia, Anxiety, Cervical cancer (CMS-HCC), Depression, GERD (gastroesophageal reflux disease), and Hypertension.  Past Surgical History:  has a past surgical history that includes Tubal ligation (2007); ganglion cyst removal (03/2009); Colposcopy; exploratory laparotomy (N/A, 04/14/2017); hysterectomy total abdominal w/pelvic lymphadenectomy (N/A, 04/14/2017); laparoscopy diagnostic (N/A, 04/14/2017); and cystourethroscopy (N/A, 04/14/2017). Family History: family history is not on file. Social History:  reports that she has been smoking cigarettes. She has a 5.00 pack-year smoking history. She has never used smokeless tobacco. She reports that she does not drink alcohol. OB/GYN History:          OB History    Gravida  4   Para  4   Term      Preterm      AB      Living  4     SAB      IAB      Ectopic      Molar      Multiple      Live Births  4          Allergies: has No Known Allergies. Medications:  Current Outpatient Medications:  .  albuterol 90  mcg/actuation inhaler, Inhale 2 inhalations into the lungs every 6 (six) hours as needed, Disp: , Rfl:  .  lisinopril (PRINIVIL,ZESTRIL) 20 MG tablet, Take 20 mg by mouth every morning  , Disp: , Rfl: 0 .  ALPRAZolam (XANAX) 1 MG tablet, Take 45 minutes prior to procedure.  Must have driver while on this medication. (Patient not taking: Reported on 12/04/2019  ), Disp: 1 tablet, Rfl: 0 .  meloxicam (MOBIC) 15 MG tablet, Take 1 tablet (15 mg total) by mouth once daily (Patient not taking: Reported on 12/04/2019  ), Disp: 30 tablet, Rfl: 0 .  SYMBICORT 80-4.5 mcg/actuation inhaler, , Disp: , Rfl:  .  traMADoL (ULTRAM) 50 mg tablet, 1 po bid prn, Disp: 14 tablet, Rfl: 0  Review of Systems: General:                      No fatigue or weight loss Eyes:                           No vision changes Ears:                            No hearing difficulty Respiratory:  No cough or shortness of breath Pulmonary:                  No asthma or shortness of breath Cardiovascular:           No chest pain, palpitations, dyspnea on exertion Gastrointestinal:          No abdominal bloating, chronic diarrhea, constipations, masses, pain or hematochezia Genitourinary:             No hematuria, dysuria, abnormal vaginal discharge, pelvic pain, Menometrorrhagia Lymphatic:                   No swollen lymph nodes Musculoskeletal:         No muscle weakness Neurologic:                  No extremity weakness, syncope, seizure disorder Psychiatric:                  No history of depression, delusions or suicidal/homicidal ideation    Exam:      Vitals:   12/04/19 1514  BP: (!) 146/97  Pulse: 73    Body mass index is 34.61 kg/m.  WDWN  black female in NAD   Lungs: CTA  CV : RRR without murmur    Neck:  no thyromegaly Abdomen: soft , no mass, normal active bowel sounds,  non-tender, no rebound tenderness Pelvic: tanner stage 5 ,  External genitalia: vulva /labia no lesions Urethra:  no prolapse Vagina: normal physiologic d/c, cuff + TTP  Slight nodularity noted R>L Cervix: absent  Uterus: absent  Adnexa: no mass,  non-tender   Rectovaginal:   Impression:   The primary encounter diagnosis was Cyst of right ovary. Diagnoses of Pelvic pain in female, Dyschezia, and Vaginal Pap smear following hysterectomy for malignancy were also pertinent to this visit.  Probable cuff adhesions  Size of the cyst is at risk for ovarian torsion   Plan:   Recommend L/S right oophorectomy and possible LOA  The procedure has been discussed with the pt     All questions answered

## 2019-12-25 ENCOUNTER — Other Ambulatory Visit: Payer: Self-pay

## 2019-12-25 ENCOUNTER — Encounter
Admission: RE | Admit: 2019-12-25 | Discharge: 2019-12-25 | Disposition: A | Discharge status: Home / Self Care | Payer: Medicaid Other | Source: Ambulatory Visit | Attending: Obstetrics and Gynecology | Admitting: Obstetrics and Gynecology

## 2019-12-25 NOTE — Patient Instructions (Signed)
Your procedure is scheduled on:01-01-20 TUESDAY Report to the Registration Desk on the 1st floor of the [facility]. To find out your arrival time, please call [phone] between 1PM - 3PM on:12-31-19 MONDAY  REMEMBER: Instructions that are not followed completely may result in serious medical risk, up to and including death; or upon the discretion of your surgeon and anesthesiologist your surgery may need to be rescheduled.  Do not eat food after midnight the night before surgery.  No gum chewing, lozengers or hard candies.  You may however, drink CLEAR liquids up to 2 hours before you are scheduled to arrive for your surgery. Do not drink anything within 2 hours of your scheduled arrival time.  Clear liquids include: - water  - apple juice without pulp - gatorade (not RED, PURPLE, OR BLUE) - black coffee or tea (Do NOT add milk or creamers to the coffee or tea) Do NOT drink anything that is not on this list.  In addition, your doctor has ordered for you to drink the provided  Ensure Pre-Surgery Clear Carbohydrate Drink  Drinking this carbohydrate drink up to two hours before surgery helps to reduce insulin resistance and improve patient outcomes. Please complete drinking 2 hours prior to scheduled arrival time.  TAKE THESE MEDICATIONS THE MORNING OF SURGERY WITH A SIP OF WATER: -NONE  Use inhalers on the day of surgery and bring to the hospital-USE YOUR ALBUTEROL [place]  One week prior to surgery: Stop Anti-inflammatories (NSAIDS) such as Advil, Aleve, Ibuprofen, Motrin, Naproxen, Naprosyn and Aspirin based products such as Excedrin, Goodys Powder, BC Powder-OK TO TAKE TYLENOL IF NEEDED  Stop ANY OVER THE COUNTER supplements until after surgery.  No Alcohol for 24 hours before or after surgery.  No Smoking including e-cigarettes for 24 hours prior to surgery.  No chewable tobacco products for at least 6 hours  prior to surgery.  No nicotine patches on the day of surgery.  Do not use any "recreational" drugs for at least a week prior to your surgery.  Please be advised that the combination of cocaine and anesthesia may have negative outcomes, up to and including death. If you test positive for cocaine, your surgery will be cancelled.  On the morning of surgery brush your teeth with toothpaste and water, you may rinse your mouth with mouthwash if you wish. Do not swallow any toothpaste or mouthwash.  Do not wear jewelry, make-up, hairpins, clips or nail polish.  Do not wear lotions, powders, or perfumes.   Do not shave body from the neck down 48 hours prior to surgery just in case you cut yourself which could leave a site for infection.  Also, freshly shaved skin may become irritated if using the CHG soap.  Contact lenses, hearing aids and dentures may not be worn into surgery.  Do not bring valuables to the hospital. [facility] is not responsible for any missing/lost belongings or valuables.   Use CHG Soap as directed on instruction sheet.  Notify your doctor if there is any change in your medical condition (cold, fever, infection).  Wear comfortable clothing (specific to your surgery type) to the hospital.  Plan for stool softeners for home use; pain medications have a tendency to cause constipation. You can also help prevent constipation by eating foods high in fiber such as fruits and vegetables and drinking plenty of fluids as your diet allows.  After surgery, you can help prevent lung  complications by doing breathing exercises.  Take deep breaths and cough every 1-2 hours. Your doctor may order a device called an Incentive Spirometer to help you take deep breaths. When coughing or sneezing, hold a pillow firmly against your incision with both hands. This is called "splinting." Doing this helps protect your incision. It also decreases belly discomfort.  If you are being admitted to  the hospital overnight, leave your suitcase in the car. After surgery it may be brought to your room.  If you are being discharged the day of surgery, you will not be allowed to drive home. You will need a responsible adult (18 years or older) to drive you home and stay with you that night.   If you are taking public transportation, you will need to have a responsible adult (18 years or older) with you. Please confirm with your physician that it is acceptable to use public transportation.   Please call the [place] [facility] at [phone] if you have any questions about these instructions.  Visitation Policy:  Patients undergoing a surgery or procedure may have one family member or support person with them as long as that person is not COVID-19 positive or experiencing its symptoms.  That person may remain in the waiting area during the procedure.  Inpatient Visitation Update:   In an effort to ensure the safety of our team members and our patients, we are implementing a change to our visitation policy:  Effective Monday, Aug. 9, at 7 a.m., inpatients will be allowed one support person.  o The support person may change daily.  o The support person must pass our screening, gel in and out, and wear a mask at all times, including in the patient's room.  o Patients must also wear a mask when staff or their support person are in the room.  o Masking is required regardless of vaccination status.  Systemwide, no visitors 17 or younger.

## 2019-12-26 ENCOUNTER — Encounter
Admission: RE | Admit: 2019-12-26 | Discharge: 2019-12-26 | Disposition: A | Discharge status: Home / Self Care | Payer: Medicaid Other | Source: Ambulatory Visit | Attending: Obstetrics and Gynecology | Admitting: Obstetrics and Gynecology

## 2019-12-26 DIAGNOSIS — I10 Essential (primary) hypertension: Secondary | ICD-10-CM | POA: Diagnosis not present

## 2019-12-26 DIAGNOSIS — Z01818 Encounter for other preprocedural examination: Secondary | ICD-10-CM | POA: Insufficient documentation

## 2019-12-26 DIAGNOSIS — Z01810 Encounter for preprocedural cardiovascular examination: Secondary | ICD-10-CM

## 2019-12-26 LAB — CBC
HCT: 36.8 % (ref 36.0–46.0)
Hemoglobin: 12 g/dL (ref 12.0–15.0)
MCH: 29.5 pg (ref 26.0–34.0)
MCHC: 32.6 g/dL (ref 30.0–36.0)
MCV: 90.4 fL (ref 80.0–100.0)
Platelets: 388 10*3/uL (ref 150–400)
RBC: 4.07 MIL/uL (ref 3.87–5.11)
RDW: 13.1 % (ref 11.5–15.5)
WBC: 6.4 10*3/uL (ref 4.0–10.5)
nRBC: 0 % (ref 0.0–0.2)

## 2019-12-26 LAB — BASIC METABOLIC PANEL
Anion gap: 9 (ref 5–15)
BUN: 11 mg/dL (ref 6–20)
CO2: 26 mmol/L (ref 22–32)
Calcium: 8.8 mg/dL — ABNORMAL LOW (ref 8.9–10.3)
Chloride: 104 mmol/L (ref 98–111)
Creatinine, Ser: 0.91 mg/dL (ref 0.44–1.00)
GFR, Estimated: >60 mL/min (ref >60)
Glucose, Bld: 94 mg/dL (ref 70–99)
Potassium: 3.6 mmol/L (ref 3.5–5.1)
Sodium: 139 mmol/L (ref 135–145)

## 2019-12-28 ENCOUNTER — Other Ambulatory Visit
Admission: RE | Admit: 2019-12-28 | Discharge: 2019-12-28 | Disposition: A | Discharge status: Home / Self Care | Payer: Medicaid Other | Source: Ambulatory Visit | Attending: Obstetrics and Gynecology | Admitting: Obstetrics and Gynecology

## 2019-12-28 ENCOUNTER — Other Ambulatory Visit: Payer: Self-pay

## 2019-12-28 DIAGNOSIS — Z20822 Contact with and (suspected) exposure to covid-19: Secondary | ICD-10-CM | POA: Diagnosis not present

## 2019-12-28 DIAGNOSIS — Z01812 Encounter for preprocedural laboratory examination: Secondary | ICD-10-CM | POA: Insufficient documentation

## 2019-12-29 LAB — SARS CORONAVIRUS 2 (TAT 6-24 HRS): SARS Coronavirus 2: NEGATIVE

## 2019-12-31 LAB — TYPE AND SCREEN
ABO/RH(D): A POS
Antibody Screen: NEGATIVE

## 2020-01-01 ENCOUNTER — Other Ambulatory Visit: Payer: Self-pay

## 2020-01-01 ENCOUNTER — Ambulatory Visit
Admission: RE | Admit: 2020-01-01 | Discharge: 2020-01-01 | Disposition: A | Discharge status: Home / Self Care | Payer: Medicaid Other | Source: Ambulatory Visit | Attending: Obstetrics and Gynecology | Admitting: Obstetrics and Gynecology

## 2020-01-01 ENCOUNTER — Ambulatory Visit: Payer: Medicaid Other | Admitting: Anesthesiology

## 2020-01-01 ENCOUNTER — Encounter: Payer: Self-pay | Admitting: Obstetrics and Gynecology

## 2020-01-01 ENCOUNTER — Encounter
Admission: RE | Disposition: A | Discharge status: Home / Self Care | Payer: Self-pay | Source: Ambulatory Visit | Attending: Obstetrics and Gynecology

## 2020-01-01 DIAGNOSIS — N8311 Corpus luteum cyst of right ovary: Secondary | ICD-10-CM | POA: Diagnosis not present

## 2020-01-01 DIAGNOSIS — Z8541 Personal history of malignant neoplasm of cervix uteri: Secondary | ICD-10-CM | POA: Insufficient documentation

## 2020-01-01 DIAGNOSIS — R102 Pelvic and perineal pain: Secondary | ICD-10-CM | POA: Diagnosis not present

## 2020-01-01 DIAGNOSIS — K5900 Constipation, unspecified: Secondary | ICD-10-CM | POA: Insufficient documentation

## 2020-01-01 DIAGNOSIS — G8929 Other chronic pain: Secondary | ICD-10-CM | POA: Insufficient documentation

## 2020-01-01 DIAGNOSIS — N941 Unspecified dyspareunia: Secondary | ICD-10-CM | POA: Diagnosis not present

## 2020-01-01 DIAGNOSIS — N83201 Unspecified ovarian cyst, right side: Secondary | ICD-10-CM | POA: Diagnosis present

## 2020-01-01 DIAGNOSIS — Z90710 Acquired absence of both cervix and uterus: Secondary | ICD-10-CM | POA: Insufficient documentation

## 2020-01-01 DIAGNOSIS — F17210 Nicotine dependence, cigarettes, uncomplicated: Secondary | ICD-10-CM | POA: Insufficient documentation

## 2020-01-01 DIAGNOSIS — N736 Female pelvic peritoneal adhesions (postinfective): Secondary | ICD-10-CM | POA: Diagnosis not present

## 2020-01-01 HISTORY — PX: LYSIS OF ADHESION: SHX5961

## 2020-01-01 HISTORY — PX: LAPAROSCOPIC SALPINGO OOPHERECTOMY: SHX5927

## 2020-01-01 LAB — URINE DRUG SCREEN, QUALITATIVE (ARMC ONLY)
Amphetamines, Ur Screen: NOT DETECTED
Barbiturates, Ur Screen: NOT DETECTED
Benzodiazepine, Ur Scrn: NOT DETECTED
Cannabinoid 50 Ng, Ur ~~LOC~~: POSITIVE — AB
Cocaine Metabolite,Ur ~~LOC~~: NOT DETECTED
MDMA (Ecstasy)Ur Screen: NOT DETECTED
Methadone Scn, Ur: NOT DETECTED
Opiate, Ur Screen: NOT DETECTED
Phencyclidine (PCP) Ur S: NOT DETECTED
Tricyclic, Ur Screen: NOT DETECTED

## 2020-01-01 SURGERY — LAPAROTOMY, FOR LYSIS OF ADHESIONS
Anesthesia: General | Laterality: Right

## 2020-01-01 MED ORDER — OXYCODONE-ACETAMINOPHEN 5-325 MG PO TABS
ORAL_TABLET | ORAL | Status: AC
  Filled 2020-01-01: qty 1

## 2020-01-01 MED ORDER — GABAPENTIN 300 MG PO CAPS
ORAL_CAPSULE | ORAL | Status: AC
  Filled 2020-01-01: qty 1

## 2020-01-01 MED ORDER — FENTANYL CITRATE (PF) 100 MCG/2ML IJ SOLN
INTRAMUSCULAR | Status: AC
  Filled 2020-01-01: qty 2

## 2020-01-01 MED ORDER — MIDAZOLAM HCL 2 MG/2ML IJ SOLN
INTRAMUSCULAR | Status: DC | PRN
  Administered 2020-01-01: 2 mg via INTRAVENOUS

## 2020-01-01 MED ORDER — ONDANSETRON HCL 4 MG/2ML IJ SOLN
4.0000 mg | Freq: Once | INTRAMUSCULAR | Status: AC | PRN
  Administered 2020-01-01: 4 mg via INTRAVENOUS

## 2020-01-01 MED ORDER — ACETAMINOPHEN 500 MG PO TABS
ORAL_TABLET | ORAL | Status: AC
  Filled 2020-01-01: qty 2

## 2020-01-01 MED ORDER — FENTANYL CITRATE (PF) 100 MCG/2ML IJ SOLN
25.0000 ug | INTRAMUSCULAR | Status: DC | PRN
  Administered 2020-01-01 (×4): 25 ug via INTRAVENOUS

## 2020-01-01 MED ORDER — ORAL CARE MOUTH RINSE
15.0000 mL | Freq: Once | OROMUCOSAL | Status: AC
  Administered 2020-01-01: 15 mL via OROMUCOSAL

## 2020-01-01 MED ORDER — FAMOTIDINE 20 MG PO TABS
ORAL_TABLET | ORAL | Status: AC
  Filled 2020-01-01: qty 1

## 2020-01-01 MED ORDER — ONDANSETRON 4 MG PO TBDP: 4.0000 mg | ORAL_TABLET | Freq: Four times a day (QID) | ORAL | Status: DC | PRN

## 2020-01-01 MED ORDER — SUGAMMADEX SODIUM 200 MG/2ML IV SOLN
INTRAVENOUS | Status: DC | PRN
  Administered 2020-01-01: 200 mg via INTRAVENOUS

## 2020-01-01 MED ORDER — BUPIVACAINE HCL 0.5 % IJ SOLN
INTRAMUSCULAR | Status: DC | PRN
  Administered 2020-01-01: 11 mL

## 2020-01-01 MED ORDER — ROCURONIUM BROMIDE 10 MG/ML (PF) SYRINGE
PREFILLED_SYRINGE | INTRAVENOUS | Status: AC
  Filled 2020-01-01: qty 10

## 2020-01-01 MED ORDER — FAMOTIDINE 20 MG PO TABS
20.0000 mg | ORAL_TABLET | Freq: Once | ORAL | Status: AC
  Administered 2020-01-01: 20 mg via ORAL

## 2020-01-01 MED ORDER — MIDAZOLAM HCL 2 MG/2ML IJ SOLN: INTRAMUSCULAR | Status: DC | PRN

## 2020-01-01 MED ORDER — DEXAMETHASONE SODIUM PHOSPHATE 10 MG/ML IJ SOLN
INTRAMUSCULAR | Status: DC | PRN
  Administered 2020-01-01: 10 mg via INTRAVENOUS

## 2020-01-01 MED ORDER — ROCURONIUM BROMIDE 100 MG/10ML IV SOLN
INTRAVENOUS | Status: DC | PRN
  Administered 2020-01-01: 10 mg via INTRAVENOUS
  Administered 2020-01-01: 50 mg via INTRAVENOUS
  Administered 2020-01-01: 10 mg via INTRAVENOUS

## 2020-01-01 MED ORDER — ONDANSETRON HCL 4 MG/2ML IJ SOLN
INTRAMUSCULAR | Status: AC
  Filled 2020-01-01: qty 2

## 2020-01-01 MED ORDER — CHLORHEXIDINE GLUCONATE 0.12 % MT SOLN
OROMUCOSAL | Status: AC
  Filled 2020-01-01: qty 15

## 2020-01-01 MED ORDER — LIDOCAINE HCL (CARDIAC) PF 100 MG/5ML IV SOSY
PREFILLED_SYRINGE | INTRAVENOUS | Status: DC | PRN
  Administered 2020-01-01: 100 mg via INTRAVENOUS

## 2020-01-01 MED ORDER — GABAPENTIN 300 MG PO CAPS
300.0000 mg | ORAL_CAPSULE | ORAL | Status: AC
  Administered 2020-01-01: 300 mg via ORAL

## 2020-01-01 MED ORDER — FENTANYL CITRATE (PF) 100 MCG/2ML IJ SOLN
INTRAMUSCULAR | Status: AC
  Administered 2020-01-01: 25 ug via INTRAVENOUS
  Filled 2020-01-01: qty 2

## 2020-01-01 MED ORDER — ONDANSETRON HCL 4 MG/2ML IJ SOLN
INTRAMUSCULAR | Status: DC | PRN
  Administered 2020-01-01: 4 mg via INTRAVENOUS

## 2020-01-01 MED ORDER — ACETAMINOPHEN 500 MG PO TABS
1000.0000 mg | ORAL_TABLET | ORAL | Status: AC
  Administered 2020-01-01: 1000 mg via ORAL

## 2020-01-01 MED ORDER — SEVOFLURANE IN SOLN
RESPIRATORY_TRACT | Status: AC
  Filled 2020-01-01: qty 250

## 2020-01-01 MED ORDER — OXYCODONE-ACETAMINOPHEN 5-325 MG PO TABS
1.0000 | ORAL_TABLET | ORAL | Status: DC | PRN
  Administered 2020-01-01: 1 via ORAL

## 2020-01-01 MED ORDER — KETOROLAC TROMETHAMINE 30 MG/ML IJ SOLN
INTRAMUSCULAR | Status: DC | PRN
  Administered 2020-01-01: 30 mg via INTRAVENOUS

## 2020-01-01 MED ORDER — LACTATED RINGERS IV SOLN: INTRAVENOUS | Status: DC

## 2020-01-01 MED ORDER — CHLORHEXIDINE GLUCONATE 0.12 % MT SOLN: 15.0000 mL | Freq: Once | OROMUCOSAL | Status: AC

## 2020-01-01 MED ORDER — MIDAZOLAM HCL 2 MG/2ML IJ SOLN
INTRAMUSCULAR | Status: AC
  Filled 2020-01-01: qty 2

## 2020-01-01 MED ORDER — DEXAMETHASONE SODIUM PHOSPHATE 10 MG/ML IJ SOLN
INTRAMUSCULAR | Status: AC
  Filled 2020-01-01: qty 1

## 2020-01-01 MED ORDER — POVIDONE-IODINE 10 % EX SWAB: 2.0000 "application " | Freq: Once | CUTANEOUS | Status: DC

## 2020-01-01 MED ORDER — FENTANYL CITRATE (PF) 100 MCG/2ML IJ SOLN
INTRAMUSCULAR | Status: DC | PRN
  Administered 2020-01-01 (×3): 50 ug via INTRAVENOUS

## 2020-01-01 MED ORDER — PROPOFOL 10 MG/ML IV BOLUS
INTRAVENOUS | Status: AC
  Filled 2020-01-01: qty 20

## 2020-01-01 MED ORDER — PROPOFOL 10 MG/ML IV BOLUS
INTRAVENOUS | Status: DC | PRN
  Administered 2020-01-01: 150 mg via INTRAVENOUS

## 2020-01-01 SURGICAL SUPPLY — 43 items
APL PRP STRL LF DISP 70% ISPRP (MISCELLANEOUS) ×2
BAG DRN RND TRDRP ANRFLXCHMBR (UROLOGICAL SUPPLIES) ×2
BAG SPEC RTRVL LRG 6X4 10 (ENDOMECHANICALS) ×2
BAG URINE DRAIN 2000ML AR STRL (UROLOGICAL SUPPLIES) ×3 IMPLANT
BLADE SURG SZ11 CARB STEEL (BLADE) ×3 IMPLANT
CANISTER SUCT 1200ML W/VALVE (MISCELLANEOUS) ×3 IMPLANT
CATH ROBINSON RED A/P 16FR (CATHETERS) ×3 IMPLANT
CHLORAPREP W/TINT 26 (MISCELLANEOUS) ×3 IMPLANT
COVER WAND RF STERILE (DRAPES) ×3 IMPLANT
DRSG TEGADERM 2-3/8X2-3/4 SM (GAUZE/BANDAGES/DRESSINGS) ×9 IMPLANT
GLOVE SURG SYN 8.0 (GLOVE) ×3 IMPLANT
GLOVE SURG SYN 8.0 PF PI (GLOVE) ×2 IMPLANT
GOWN STRL REUS W/ TWL LRG LVL3 (GOWN DISPOSABLE) ×4 IMPLANT
GOWN STRL REUS W/ TWL XL LVL3 (GOWN DISPOSABLE) ×2 IMPLANT
GOWN STRL REUS W/TWL LRG LVL3 (GOWN DISPOSABLE) ×6
GOWN STRL REUS W/TWL XL LVL3 (GOWN DISPOSABLE) ×3
GRASPER SUT TROCAR 14GX15 (MISCELLANEOUS) ×3 IMPLANT
IRRIGATION STRYKERFLOW (MISCELLANEOUS) ×2 IMPLANT
IRRIGATOR STRYKERFLOW (MISCELLANEOUS) ×3
IV NS 1000ML (IV SOLUTION) ×3
IV NS 1000ML BAXH (IV SOLUTION) ×2 IMPLANT
KIT TURNOVER CYSTO (KITS) ×3 IMPLANT
LABEL OR SOLS (LABEL) ×3 IMPLANT
MANIFOLD NEPTUNE II (INSTRUMENTS) ×3 IMPLANT
NS IRRIG 500ML POUR BTL (IV SOLUTION) ×3 IMPLANT
PACK GYN LAPAROSCOPIC (MISCELLANEOUS) ×3 IMPLANT
PAD OB MATERNITY 4.3X12.25 (PERSONAL CARE ITEMS) ×3 IMPLANT
PAD PREP 24X41 OB/GYN DISP (PERSONAL CARE ITEMS) ×3 IMPLANT
POUCH SPECIMEN RETRIEVAL 10MM (ENDOMECHANICALS) ×3 IMPLANT
SCISSORS MNPLR CVD DVNC XI (INSTRUMENTS) IMPLANT
SCISSORS XI MNPLR CVD DVNC (INSTRUMENTS)
SET TUBE SMOKE EVAC HIGH FLOW (TUBING) ×3 IMPLANT
SHEARS HARMONIC ACE PLUS 36CM (ENDOMECHANICALS) ×3 IMPLANT
SLEEVE ENDOPATH XCEL 5M (ENDOMECHANICALS) ×3 IMPLANT
SPONGE GAUZE 2X2 8PLY STRL LF (GAUZE/BANDAGES/DRESSINGS) ×9 IMPLANT
STRIP CLOSURE SKIN 1/4X4 (GAUZE/BANDAGES/DRESSINGS) ×3 IMPLANT
SUT VIC AB 0 CT1 36 (SUTURE) ×3 IMPLANT
SUT VIC AB 2-0 UR6 27 (SUTURE) ×3 IMPLANT
SUT VIC AB 4-0 SH 27 (SUTURE) ×3
SUT VIC AB 4-0 SH 27XANBCTRL (SUTURE) ×2 IMPLANT
SWABSTK COMLB BENZOIN TINCTURE (MISCELLANEOUS) ×3 IMPLANT
TROCAR ENDO BLADELESS 11MM (ENDOMECHANICALS) ×3 IMPLANT
TROCAR XCEL NON-BLD 5MMX100MML (ENDOMECHANICALS) ×3 IMPLANT

## 2020-01-01 NOTE — Transfer of Care (Signed)
Immediate Anesthesia Transfer of Care Note  Patient: Cassidy Mathis  Procedure(s) Performed: LAPAROSCOPIC OOPHORECTOMY (Right ) LYSIS OF ADHESION (N/A )  Patient Location: PACU  Anesthesia Type:General  Level of Consciousness: awake, alert  and oriented  Airway & Oxygen Therapy: Patient Spontanous Breathing and Patient connected to face mask oxygen  Post-op Assessment: Report given to RN and Post -op Vital signs reviewed and stable  Post vital signs: Reviewed and stable  Last Vitals:  Vitals Value Taken Time  BP 139/90 01/01/20 1049  Temp 36.2 C 01/01/20 1047  Pulse 75 01/01/20 1054  Resp 18 01/01/20 1054  SpO2 100 % 01/01/20 1054  Vitals shown include unvalidated device data.  Last Pain:  Vitals:   01/01/20 1047  TempSrc:   PainSc: 0-No pain         Complications: No complications documented.

## 2020-01-01 NOTE — Op Note (Signed)
  Procedure Date:  01/01/2020  Pre-operative Diagnosis:  Right ovarian cyst, pelvic pain  Post-operative Diagnosis:  Right ovarian cyst, pelvic pain, small bowel adhesions  Procedure:  Laparoscopic lysis of adhesions  Surgeon:  Melvyn Neth, MD  Assistant:  Laverta Baltimore, MD; Larey Days, MD.  Anesthesia:  General endotracheal  Estimated Blood Loss:  2 ml  Complications:  None  Indications for Procedure:  This is a 44 y.o. female undergoing a laparoscopic oopherectomy for a right ovarian cyst associated with pelvic pain.  Intraoperatively, Drs. Schermerhorn and Ward encountered significant adhesions, particularly some adhesions of the small bowel to the right lateral pelvic wall and small bowel to the left lateral pelvic wall.  She had had a prior hysterectomy.  Given her pain was mostly on the right lower quadrant, surgery was consulted intraoperatively for adhesiolysis.  Description of Procedure: The patient was already under general anesthesia, prepped and draped.  Drs. Schermerhorn and Ward had already completed the laparoscopic right oopherectomy and had performed part of the adhesiolysis for their procedure.  I scrubbed in at this point.  There was a short segment of small bowel significantly adhered to the right lateral pelvic wall.  Laparoscopic scissors were used to perform sharp lysis of adhesions of the small bowel to the right lateral pelvic wall, anterior to the level of the right ureter.  This was done without complications and the bowel afterwards did not show any injury.  Subsequently, there was a longer segment of small bowel adhered to the left lateral wall, at the level of the sigmoid colon.  These were also taken down using laparoscopic scissors and blunt dissection without complication.  Following that, the bowel was again inspected without issues, and I proceeded to scrub out so Drs. Schermerhorn and Ward could complete their surgery.   Melvyn Neth,  MD

## 2020-01-01 NOTE — Discharge Instructions (Signed)
Diagnostic Laparoscopy Diagnostic laparoscopy is a procedure to diagnose diseases in the abdomen. It might be done for a variety of reasons, such as to look for scar tissue, cancer, or a reason for abdomen (abdominal) pain. During the procedure, a thin, flexible tube that has a light and a camera on the end (laparoscope) is inserted through an incision in the abdomen. The image from the camera is shown on a monitor to help your surgeon see inside your body. Tell a health care provider about:  Any allergies you have.  All medicines you are taking, including vitamins, herbs, eye drops, creams, and over-the-counter medicines.  Any problems you or family members have had with anesthetic medicines.  Any blood disorders you have.  Any surgeries you have had.  Any medical conditions you have. What are the risks? Generally, this is a safe procedure. However, problems may occur, including:  Infection.  Bleeding.  Allergic reactions to medicines or dyes.  Damage to abdominal structures or organs, such as the intestines, liver, stomach, or spleen. What happens before the procedure? Medicines  Ask your health care provider about: ? Changing or stopping your regular medicines. This is especially important if you are taking diabetes medicines or blood thinners. ? Taking medicines such as aspirin and ibuprofen. These medicines can thin your blood. Do not take these medicines unless your health care provider tells you to take them. ? Taking over-the-counter medicines, vitamins, herbs, and supplements.  You may be given antibiotic medicine to help prevent infection. Staying hydrated Follow instructions from your health care provider about hydration, which may include:  Up to 2 hours before the procedure - you may continue to drink clear liquids, such as water, clear fruit juice, black coffee, and plain tea. Eating and drinking restrictions Follow instructions from your health care provider  about eating and drinking, which may include:  8 hours before the procedure - stop eating heavy meals or foods such as meat, fried foods, or fatty foods.  6 hours before the procedure - stop eating light meals or foods, such as toast or cereal.  6 hours before the procedure - stop drinking milk or drinks that contain milk.  2 hours before the procedure - stop drinking clear liquids. General instructions  Ask your health care provider how your surgical site will be marked or identified.  You may be asked to shower with a germ-killing soap.  Plan to have someone take you home from the hospital or clinic.  Plan to have a responsible adult care for you for at least 24 hours after you leave the hospital or clinic. This is important. What happens during the procedure?   To lower your risk of infection: ? Your health care team will wash or sanitize their hands. ? Hair may be removed from the surgical area. ? Your skin will be washed with soap.  An IV will be inserted into one of your veins.  You will be given a medicine to make you fall asleep (general anesthetic). You may also be given a medicine to help you relax (sedative).  A breathing tube will be placed down your throat to help you breathe during the procedure.  Your abdomen will be filled with an air-like gas so it expands. This will give the surgeon more room to operate and will make your organs easier to see.  Many small incisions will be made in your abdomen.  A laparoscope and other surgical instruments will be inserted into your abdomen through the  incisions.  A tissue sample may be removed from an organ for examination (biopsy). This will depend on the reason why you are having this procedure.  The laparoscope and other instruments will be removed from your abdomen.  The gas will be released.  Your incisions will be closed with stitches (sutures) and covered with a bandage (dressing).  Your breathing tube will be  removed. The procedure may vary among health care providers and hospitals. What happens after the procedure?   Your blood pressure, heart rate, breathing rate, and blood oxygen level will be monitored until the medicines you were given have worn off.  Do not drive for 24 hours if you were given a sedative during your procedure.  It is up to you to get the results of your procedure. Ask your health care provider, or the department that is doing the procedure, when your results will be ready. Summary  Diagnostic laparoscopy is a way to look for problems in the abdomen using small incisions.  Follow instructions from your health care provider about how to prepare for the procedure.  Plan to have a responsible adult care for you for at least 24 hours after you leave the hospital or clinic. This is important. This information is not intended to replace advice given to you by your health care provider. Make sure you discuss any questions you have with your health care provider. Document Revised: 12/24/2016 Document Reviewed: 07/07/2016 Elsevier Patient Education  2020 [place]   1) The drugs that you were given will stay in your system until tomorrow so for the next 24 hours you should not:  A) Drive an automobile B) Make any legal decisions C) Drink any alcoholic beverage   2) You may resume regular meals tomorrow.  Today it is better to start with liquids and gradually work up to solid foods.  You may eat anything you prefer, but it is better to start with liquids, then soup and crackers, and gradually work up to solid foods.   3) Please notify your doctor immediately if you have any unusual bleeding, trouble breathing, redness and pain at the surgery site, drainage, fever, or pain not relieved by medication.  4) Your post-operative visit with Dr.                                     is: Date:                        Time:    Please  call to schedule your post-operative visit.  5) Additional Instructions:

## 2020-01-01 NOTE — Brief Op Note (Signed)
01/01/2020  10:33 AM  PATIENT:  Krisinda L Diffee  44 y.o. female  PRE-OPERATIVE DIAGNOSIS:  right ovarian cyst, pelvic pain, dyschezia  POST-OPERATIVE DIAGNOSIS:  right ovarian cyst, pelvic pain, dyschezia Extensive abdominopelvic adhesions PROCEDURE:  Procedure(s): LAPAROSCOPIC OOPHORECTOMY (Right) LYSIS OF ADHESION (N/A)- extensive 90 % of operating time   SURGEON:  Surgeon(s) and Role:    * Lakayla Barrington, Gwen Her, MD - Primary    * Ward, Honor Loh, MD - Assisting    * Olean Ree, MD - Assisting- consultant Gen Surgery   PHYSICIAN ASSISTANT:   ASSISTANTS: none   ANESTHESIA:   general  EBL: 100 cc, ou 75 cc IOF 800 cc   BLOOD ADMINISTERED:none  DRAINS: none   LOCAL MEDICATIONS USED:  MARCAINE     SPECIMEN:  Source of Specimen:  right tube and ovary   DISPOSITION OF SPECIMEN:  PATHOLOGY  COUNTS:  YES  TOURNIQUET:  * No tourniquets in log *  DICTATION: .Other Dictation: Dictation Number verbal  PLAN OF CARE: Discharge to home after PACU  PATIENT DISPOSITION:  PACU - hemodynamically stable.   Delay start of Pharmacological VTE agent (>24hrs) due to surgical blood loss or risk of bleeding: not applicable

## 2020-01-01 NOTE — Anesthesia Preprocedure Evaluation (Signed)
Anesthesia Evaluation  Patient identified by MRN, date of birth, ID band Patient awake    Reviewed: Allergy & Precautions, H&P , NPO status , reviewed documented beta blocker date and time   Airway Mallampati: II       Dental no notable dental hx.    Pulmonary asthma , Current Smoker and Patient abstained from smoking.,    Pulmonary exam normal        Cardiovascular hypertension, Normal cardiovascular exam     Neuro/Psych  Headaches, PSYCHIATRIC DISORDERS Depression    GI/Hepatic Neg liver ROS, GERD  Controlled,  Endo/Other  negative endocrine ROS  Renal/GU negative Renal ROS     Musculoskeletal negative musculoskeletal ROS (+)   Abdominal   Peds negative pediatric ROS (+)  Hematology  (+) anemia ,   Anesthesia Other Findings Past Medical History: No date: Allergy     Comment:  seasonal No date: Anemia     Comment:  h/o No date: Asthma No date: Cancer ([place])     Comment:  cervical No date: Depression No date: GERD (gastroesophageal reflux disease)     Comment:  RARE-NO MEDS No date: Headache     Comment:  MIGRAINES No date: Hypertension     Comment:  PT STATES SHE IS SUPPOSED TO BE TAKING LISINOPRIL-HCTZ               BUT HAS BEEN OUT "FOR A WHILE" NEEDS TO GET ANOTHER               PRESCRIPTION FROM HER PCP No date: Pancreatitis No date: Pilonidal cyst     Comment:  TOOK ANTIBIOTIC THIS MONTH AND IT IS RESOLVED 2018: Pneumonia  Reproductive/Obstetrics                             Anesthesia Physical  Anesthesia Plan  ASA: II  Anesthesia Plan: General   Post-op Pain Management:    Induction: Intravenous  PONV Risk Score and Plan:   Airway Management Planned: Oral ETT  Additional Equipment:   Intra-op Plan:   Post-operative Plan: Extubation in OR  Informed Consent: I have reviewed the patients History and Physical, chart, labs and discussed the procedure  including the risks, benefits and alternatives for the proposed anesthesia with the patient or authorized representative who has indicated his/her understanding and acceptance.     Dental Advisory Given  Plan Discussed with: CRNA  Anesthesia Plan Comments:         Anesthesia Quick Evaluation

## 2020-01-01 NOTE — Progress Notes (Signed)
Pt is scheduled for a L/S RSO for 11 cm complex right ovarian cyst . LAbs reviewed . All questions answered . Proceed

## 2020-01-01 NOTE — Anesthesia Procedure Notes (Addendum)
Procedure Name: Intubation Date/Time: 01/01/2020 7:52 AM Performed by: Allean Found, CRNA Pre-anesthesia Checklist: Patient identified, Patient being monitored, Timeout performed, Emergency Drugs available and Suction available Patient Re-evaluated:Patient Re-evaluated prior to induction Oxygen Delivery Method: Circle system utilized Preoxygenation: Pre-oxygenation with 100% oxygen Induction Type: IV induction Ventilation: Mask ventilation without difficulty Laryngoscope Size: 3 and McGraph Grade View: Grade I Tube type: Oral Tube size: 7.0 mm Number of attempts: 1 Airway Equipment and Method: Stylet Placement Confirmation: ETT inserted through vocal cords under direct vision,  positive ETCO2 and breath sounds checked- equal and bilateral Secured at: 21 cm Tube secured with: Tape Dental Injury: Teeth and Oropharynx as per pre-operative assessment

## 2020-01-01 NOTE — Anesthesia Postprocedure Evaluation (Signed)
Anesthesia Post Note  Patient: Cassidy Mathis  Procedure(s) Performed: LYSIS OF ADHESION (N/A ) LAPAROSCOPIC SALPINGO OOPHORECTOMY (Right )  Patient location during evaluation: PACU Anesthesia Type: General Level of consciousness: awake and alert Pain management: pain level controlled Vital Signs Assessment: post-procedure vital signs reviewed and stable Respiratory status: spontaneous breathing, nonlabored ventilation, respiratory function stable and patient connected to nasal cannula oxygen Cardiovascular status: blood pressure returned to baseline and stable Postop Assessment: no apparent nausea or vomiting Anesthetic complications: no   No complications documented.   Last Vitals:  Vitals:   01/01/20 1157 01/01/20 1230  BP: (!) 141/87 (!) 123/92  Pulse: 71 63  Resp: 18   Temp: 36.9 C   SpO2: 98% 100%    Last Pain:  Vitals:   01/01/20 1230  TempSrc:   PainSc: 2                  [name]

## 2020-01-02 LAB — SURGICAL PATHOLOGY

## 2020-01-02 NOTE — Op Note (Signed)
NAME: Cassidy Mathis, Cassidy Mathis MEDICAL RECORD ON:[id number] ACCOUNT [id number] DATE OF BIRTH:August 14, 1975 FACILITY: [facility] LOCATION: [facility] PHYSICIAN:Tuck Dulworth Josefine Class, MD  OPERATIVE REPORT  DATE OF PROCEDURE:  01/01/2020  PREOPERATIVE DIAGNOSES: 1.  An 11 x 10 cm right ovarian cyst. 2.  Chronic pelvic pain. 3.  Dyschezia. 4.  Dyspareunia.  POSTOPERATIVE DIAGNOSES: 1.  An 11 x 10 right ovarian cyst. 2.  Significant abdominopelvic adhesions. 3.  Chronic pelvic pain. 4.  Dyschezia. 5.  Dyspareunia.  PROCEDURE: 1.  Laparoscopic pelvic and abdominal adhesiolysis, extensive, incorporating 90% of total operating time. 2.  Laparoscopic right oophorectomy. 3.  Intraoperative consultation, general surgery, Dr. Hampton Abbot, for bowel adhesiolysis.  SURGEON:  Laverta Baltimore, MD  FIRST ASSISTANT:  Chelsea Ward.  CONSULTANT:  Dr. Olean Ree, general surgery.  ANESTHESIA:  General endotracheal anesthesia.  INDICATIONS:  This is a 44 year old female with a prior history of radical hysterectomy for cervical cancer who presented to the office with 11 x 10 cm right ovarian cyst and complaints of chronic pelvic pain, dyschezia, dyspareunia.  DESCRIPTION OF PROCEDURE:  After adequate general endotracheal anesthesia, the patient was placed in dorsal supine position with the legs placed in the [place] stirrups.  The patient's abdomen, perineum and vagina were prepped and draped in normal sterile  fashion.  Timeout was performed.  Straight catheterization of the bladder yielded 75 mL clear urine.  A double sponge stick was placed in the vagina to be used for vaginal manipulation during the procedure.  Gloves and gown were changed.  Attention was  directed to the patient's abdomen where a 5 mm infraumbilical incision was made after injecting with 0.5% Marcaine.  The laparoscope was advanced into the abdominal cavity under direct visualization with the Optiview cannula.  Initial impression  revealed  omental adhesions to the anterior abdominal wall.  A second port site was placed in the right lower quadrant approximately 3 cm medial to the right anterior iliac spine, #11 trocar was advanced under direct visualization and third port site was placed  in the left lower quadrant, again 3 cm medial to the left anterior iliac spine and a 5 mm trocar was advanced under direct visualization.  Harmonic scalpel was brought up and the omental adhesions were taken down from the anterior abdominal wall.   Initial impression thereafter demonstrated multiple loops of bowel adhesed to both sidewalls and especially over the right adnexa.  For the next hour and forty-five minutes of which was approximately 90% of our operating time, adhesiolysis with removal of wall scar  tissue from the right adnexa ensued.  During the dissection, the ovarian cyst was entered and straw-colored fluid was draining from the cyst.  Ultimately, the bowel was removed from the right adnexa, in so much as the right infundibulopelvic ligament  could be cauterized and excised.  Before doing this, the right ureter was identified with peristaltic activity.  Ultimately, the right ovary was removed from the right sidewall and placed into the posterior cul-de-sac to be removed at the end of the  case.  Bowel adhesiolysis ensued to the point that the small bowel was densely adherent down to the right sidewall.  Surgeons at that point did not feel comfortable dissecting the bowel from the sidewall, given the proximity to the right ureter.   Therefore, Dr. Olean Ree, general surgeon was called in for intraoperative consultation.  Dr. Hampton Abbot scrubbed into the case and dissected small bowel from this right sidewall.  Please see his operation report.  Dr. Hampton Abbot also  removed some scar  tissue and bowel adhesions on the left sidewall during the procedure.  The right ovary was then removed through the right lower port site with EndoCatch bag.   The patient's abdomen was irrigated and multiple pictures were taken during the procedure here.   The right ureter was identified again with normal peristaltic activity.  Loops of bowel obscured the left adnexa.  Therefore, the left ovary was not visualized.  Intraoperative pressure was lowered to 7 mmHg and good hemostasis was noted.  The right  lower port site was closed with a fascial layer of 2-0 Vicryl suture using the Carter-Thomason cone and PMI.  The patient's abdomen was deflated and all skin incisions were closed with interrupted 4-0 Vicryl suture.  Sterile dressing applied.  Sponge  stick was removed from the vagina.  There were no complications.  INTRAOPERATIVE FLUIDS:  800 mL.  URINE OUTPUT:  75 mL  ESTIMATED BLOOD LOSS:  100 mL.  The patient did receive 30 mg intravenous Toradol at the end of the case.  The patient was taken to recovery room in good condition.  HN/NUANCE  D:01/01/2020 T:01/02/2020 JOB:[id number]

## 2020-03-24 ENCOUNTER — Emergency Department
Admission: EM | Admit: 2020-03-24 | Discharge: 2020-03-24 | Disposition: A | Discharge status: Home / Self Care | Payer: Medicaid Other | Attending: Emergency Medicine | Admitting: Emergency Medicine

## 2020-03-24 ENCOUNTER — Emergency Department: Payer: Medicaid Other

## 2020-03-24 ENCOUNTER — Other Ambulatory Visit: Payer: Self-pay

## 2020-03-24 ENCOUNTER — Encounter: Payer: Self-pay | Admitting: Emergency Medicine

## 2020-03-24 DIAGNOSIS — J45909 Unspecified asthma, uncomplicated: Secondary | ICD-10-CM | POA: Diagnosis not present

## 2020-03-24 DIAGNOSIS — I10 Essential (primary) hypertension: Secondary | ICD-10-CM | POA: Diagnosis not present

## 2020-03-24 DIAGNOSIS — M25562 Pain in left knee: Secondary | ICD-10-CM

## 2020-03-24 DIAGNOSIS — Z79899 Other long term (current) drug therapy: Secondary | ICD-10-CM | POA: Diagnosis not present

## 2020-03-24 DIAGNOSIS — F17210 Nicotine dependence, cigarettes, uncomplicated: Secondary | ICD-10-CM | POA: Insufficient documentation

## 2020-03-24 DIAGNOSIS — Z8541 Personal history of malignant neoplasm of cervix uteri: Secondary | ICD-10-CM | POA: Insufficient documentation

## 2020-03-24 LAB — CBC WITH DIFFERENTIAL/PLATELET
Abs Immature Granulocytes: 0.03 10*3/uL (ref 0.00–0.07)
Basophils Absolute: 0 10*3/uL (ref 0.0–0.1)
Basophils Relative: 0 %
Eosinophils Absolute: 0.2 10*3/uL (ref 0.0–0.5)
Eosinophils Relative: 3 %
HCT: 36.7 % (ref 36.0–46.0)
Hemoglobin: 12.2 g/dL (ref 12.0–15.0)
Immature Granulocytes: 0 %
Lymphocytes Relative: 32 %
Lymphs Abs: 2.6 10*3/uL (ref 0.7–4.0)
MCH: 29.5 pg (ref 26.0–34.0)
MCHC: 33.2 g/dL (ref 30.0–36.0)
MCV: 88.6 fL (ref 80.0–100.0)
Monocytes Absolute: 0.5 10*3/uL (ref 0.1–1.0)
Monocytes Relative: 7 %
Neutro Abs: 4.7 10*3/uL (ref 1.7–7.7)
Neutrophils Relative %: 58 %
Platelets: 403 10*3/uL — ABNORMAL HIGH (ref 150–400)
RBC: 4.14 MIL/uL (ref 3.87–5.11)
RDW: 12.9 % (ref 11.5–15.5)
WBC: 8.1 10*3/uL (ref 4.0–10.5)
nRBC: 0 % (ref 0.0–0.2)

## 2020-03-24 LAB — BASIC METABOLIC PANEL
Anion gap: 7 (ref 5–15)
BUN: 7 mg/dL (ref 6–20)
CO2: 24 mmol/L (ref 22–32)
Calcium: 9 mg/dL (ref 8.9–10.3)
Chloride: 106 mmol/L (ref 98–111)
Creatinine, Ser: 0.94 mg/dL (ref 0.44–1.00)
GFR, Estimated: >60 mL/min (ref >60)
Glucose, Bld: 94 mg/dL (ref 70–99)
Potassium: 3.4 mmol/L — ABNORMAL LOW (ref 3.5–5.1)
Sodium: 137 mmol/L (ref 135–145)

## 2020-03-24 LAB — URIC ACID: Uric Acid, Serum: 5.4 mg/dL (ref 2.5–7.1)

## 2020-03-24 MED ORDER — LISINOPRIL 40 MG PO TABS: 40.0000 mg | ORAL_TABLET | ORAL | 1 refills | Status: DC

## 2020-03-24 MED ORDER — OXYCODONE-ACETAMINOPHEN 5-325 MG PO TABS
1.0000 | ORAL_TABLET | Freq: Four times a day (QID) | ORAL | 0 refills | Status: DC | PRN
Start: 2020-03-24 — End: 2020-11-28

## 2020-03-24 MED ORDER — NAPROXEN 500 MG PO TABS: 500.0000 mg | ORAL_TABLET | Freq: Two times a day (BID) | ORAL | Status: DC

## 2020-03-24 MED ORDER — NAPROXEN 500 MG PO TABS
500.0000 mg | ORAL_TABLET | Freq: Once | ORAL | Status: AC
  Administered 2020-03-24: 500 mg via ORAL
  Filled 2020-03-24: qty 1

## 2020-03-24 NOTE — ED Provider Notes (Signed)
[facility] Emergency Department Provider Note   ____________________________________________   Event Date/Time   First MD Initiated Contact with Patient 03/24/20 1302     (approximate)  I have reviewed the triage vital signs and the nursing notes.   HISTORY  Chief Complaint No chief complaint on file.    HPI Cassidy Mathis is a 45 y.o. female patient presents with atraumatic knee swelling.  Patient state noticed pain and discomfort about 2 weeks.  Became more pronounced 1 week ago.  Denies provocative incident for complaint.  Patient pain is 8/10.  Described pain as "achy".  No palliative measure for complaint.  Question the patient about elevated blood pressure and she states she has not been able to make it to the clinic for refill of her medication due to her job schedule.         Past Medical History:  Diagnosis Date  . Allergy    seasonal  . Anemia    h/o  . Asthma   . Cancer (HCC)    cervical  . Depression   . GERD (gastroesophageal reflux disease)    RARE-NO MEDS  . Headache    MIGRAINES  . Hypertension    PT STATES SHE IS SUPPOSED TO BE TAKING LISINOPRIL-HCTZ BUT HAS BEEN OUT "FOR A WHILE" NEEDS TO GET ANOTHER PRESCRIPTION FROM HER PCP  . Pancreatitis   . Pilonidal cyst    TOOK ANTIBIOTIC THIS MONTH AND IT IS RESOLVED  . Pneumonia 2018    Patient Active Problem List   Diagnosis Date Noted  . Right ovarian cyst   . Cervical cancer, FIGO stage IB1 ([place]) 05/17/2017  . Ileus ([place]) 04/18/2017  . Postoperative anemia due to acute blood loss 04/18/2017  . Tobacco use 04/06/2017  . 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 Common bile duct stone   . Pancreatitis 12/16/2016  . 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 Incarcerated epigastric hernia     Past Surgical History:  Procedure Laterality Date  . ABDOMINAL HYSTERECTOMY     partial  . CERVICAL CONIZATION W/BX N/A 03/23/2017    Procedure: CONIZATION CERVIX WITH BIOPSY;  Surgeon: Gillis Ends, MD;  Location: [facility] ORS;  Service: Gynecology;  Laterality: N/A;  . CHOLECYSTECTOMY  10/21/2015   Procedure: LAPAROSCOPIC CHOLECYSTECTOMY WITH INTRAOPERATIVE CHOLANGIOGRAM;  Surgeon: Jules Husbands, MD;  Location: [facility] ORS;  Service: General;;  . CHOLECYSTECTOMY Bilateral   . CYST EXCISION     tongue AND WRIST  . EPIGASTRIC HERNIA REPAIR N/A 04/18/2015   Procedure: HERNIA REPAIR EPIGASTRIC ADULT;  Surgeon: Jules Husbands, MD;  Location: [facility] ORS;  Service: General;  Laterality: N/A;  . ERCP N/A 12/21/2016   Procedure: ENDOSCOPIC RETROGRADE CHOLANGIOPANCREATOGRAPHY (ERCP);  Surgeon: [name], MD;  Location: [facility] ENDOSCOPY;  Service: Endoscopy;  Laterality: N/A;  . HERNIA REPAIR    . LAPAROSCOPIC SALPINGO OOPHERECTOMY Right 01/01/2020   Procedure: LAPAROSCOPIC SALPINGO OOPHORECTOMY;  Surgeon: Schermerhorn, Gwen Her, MD;  Location: [facility] ORS;  Service: Gynecology;  Laterality: Right;  . LYSIS OF ADHESION N/A 01/01/2020   Procedure: LYSIS OF ADHESION;  Surgeon: Schermerhorn, Gwen Her, MD;  Location: [facility] ORS;  Service: Gynecology;  Laterality: N/A;  . RADICAL HYSTERECTOMY  04/14/2017  . SALPINGECTOMY Bilateral 04/14/2017  . SENTINEL LYMPH NODE BIOPSY    . TUBAL LIGATION      Prior to Admission medications   Medication Sig Start Date End Date Taking? Authorizing Provider  naproxen (NAPROSYN)  500 MG tablet Take 1 tablet (500 mg total) by mouth 2 (two) times daily with a meal. 03/24/20  Yes Sable Feil, PA-C  oxyCODONE-acetaminophen (PERCOCET) 5-325 MG tablet Take 1 tablet by mouth every 6 (six) hours as needed for severe pain. 03/24/20 03/24/21 Yes Sable Feil, PA-C  albuterol (PROVENTIL HFA;VENTOLIN HFA) 108 (90 Base) MCG/ACT inhaler Inhale 2 puffs into the lungs every 6 (six) hours as needed for wheezing or shortness of breath. 02/28/18   Merlyn Lot, MD  gabapentin (NEURONTIN) 300 MG capsule Take 300 mg by mouth  at bedtime.    [provider]  lisinopril (ZESTRIL) 40 MG tablet Take 1 tablet (40 mg total) by mouth every morning. 03/24/20   Sable Feil, PA-C    Allergies Patient has no known allergies.  Family History  Problem Relation Age of Onset  . Hypertension Mother   . Gallstones Mother   . Hypertension Father   . Gout Father   . Cancer Paternal Uncle        lung  . Cancer Maternal Grandmother        not sure what type of cancer  . Cancer Maternal Grandfather        not sure what type of cancer  . Cancer Paternal Grandmother        not sure what type of cancer  . Cancer Paternal Grandfather        not sure what type of cancer    Social History Social History   Tobacco Use  . Smoking status: Current Every Day Smoker    Packs/day: 0.50    Years: 12.00    Pack years: 6.00    Types: Cigarettes  . Smokeless tobacco: Never Used  Vaping Use  . Vaping Use: Never used  Substance Use Topics  . Alcohol use: No    Alcohol/week: 0.0 standard drinks  . Drug use: Yes    Types: Marijuana    Comment: "not often"    Review of Systems Constitutional: No fever/chills Eyes: No visual changes. ENT: No sore throat. Cardiovascular: Denies chest pain. Respiratory: Denies shortness of breath. Gastrointestinal: No abdominal pain.  No nausea, no vomiting.  No diarrhea.  No constipation. Genitourinary: Negative for dysuria. Musculoskeletal: Knee pain. Skin: Negative for rash. Neurological: Negative for headaches, focal weakness or numbness. Endocrine:  Hypertension ____________________________________________   PHYSICAL EXAM:  VITAL SIGNS: ED Triage Vitals  Enc Vitals Group     BP 03/24/20 1249 (!) 169/110     Pulse Rate 03/24/20 1249 70     Resp 03/24/20 1249 18     Temp 03/24/20 1249 98 F (36.7 C)     Temp Source 03/24/20 1249 Oral     SpO2 03/24/20 1249 100 %     Weight 03/24/20 1224 210 lb 12.2 oz (95.6 kg)     Height 03/24/20 1224 5\' 5"  (1.651 m)     Head  Circumference --      Peak Flow --      Pain Score 03/24/20 1250 8     Pain Loc --      Pain Edu? --      Excl. in [place]? --    Constitutional: Alert and oriented. Well appearing and in no acute distress. Cardiovascular: Normal rate, regular rhythm. Grossly normal heart sounds.  Good peripheral circulation elevated blood pressure. Respiratory: Normal respiratory effort.  No retractions. Lungs CTAB. Musculoskeletal: No obvious deformity to the left knee.  No obvious edema or erythema.  Patient has moderate guarding with palpation of the anterior inferior patella. Neurologic:  Normal speech and language. No gross focal neurologic deficits are appreciated. No gait instability. Skin:  Skin is warm, dry and intact. No rash noted. Psychiatric: Mood and affect are normal. Speech and behavior are normal.  ____________________________________________   LABS (all labs ordered are listed, but only abnormal results are displayed)  Labs Reviewed  BASIC METABOLIC PANEL - Abnormal; Notable for the following components:      Result Value   Potassium 3.4 (*)    All other components within normal limits  CBC WITH DIFFERENTIAL/PLATELET - Abnormal; Notable for the following components:   Platelets 403 (*)    All other components within normal limits  URIC ACID   ____________________________________________  EKG   ____________________________________________  RADIOLOGY I, Sable Feil, personally viewed and evaluated these images (plain radiographs) as part of my medical decision making, as well as reviewing the written report by the radiologist.  ED MD interpretation: No acute findings x-ray of the left knee.  Official radiology report(s): DG Knee Complete 4 Views Left  Result Date: 03/24/2020 CLINICAL DATA:  Knee pain for 2 weeks, no known injury, initial encounter EXAM: LEFT KNEE - COMPLETE 4+ VIEW COMPARISON:  None. FINDINGS: No acute fracture or dislocation is noted. Mild medial joint  space narrowing is noted. No joint effusion is seen. No soft tissue abnormality is noted. IMPRESSION: Mild degenerative change without acute abnormality. Electronically Signed   By: Inez Catalina M.D.   On: 03/24/2020 13:31    ____________________________________________   PROCEDURES  Procedure(s) performed (including Critical Care):  Procedures   ____________________________________________   INITIAL IMPRESSION / ASSESSMENT AND PLAN / ED COURSE  As part of my medical decision making, I reviewed the following data within the [place]         Patient presents with increasing left knee pain.  Discussed no acute findings on x-ray labs.  Patient placed in an an elastic knee support and given discharge care instruction.  Take medication as directed.  Follow orthopedic if no improvement in 3 to 5 days.      ____________________________________________   FINAL CLINICAL IMPRESSION(S) / ED DIAGNOSES  Final diagnoses:  Acute pain of left knee     ED Discharge Orders         Ordered    naproxen (NAPROSYN) 500 MG tablet  2 times daily with meals        03/24/20 1413    oxyCODONE-acetaminophen (PERCOCET) 5-325 MG tablet  Every 6 hours PRN        03/24/20 1413    lisinopril (ZESTRIL) 40 MG tablet  BH-each morning        03/24/20 1415          *Please note:  Cassidy Mathis was evaluated in Emergency Department on 03/24/2020 for the symptoms described in the history of present illness. She was evaluated in the context of the global COVID-19 pandemic, which necessitated consideration that the patient might be at risk for infection with the SARS-CoV-2 virus that causes COVID-19. Institutional protocols and algorithms that pertain to the evaluation of patients at risk for COVID-19 are in a state of rapid change based on information released by regulatory bodies including the CDC and federal and state organizations. These policies and algorithms were followed during  the patient's care in the ED.  Some ED evaluations and interventions may be delayed as a result of limited staffing during and the  pandemic.*   Note:  This document was prepared using Dragon voice recognition software and may include unintentional dictation errors.    Sable Feil, PA-C 03/24/20 1420    Vladimir Crofts, MD 03/24/20 [phone]

## 2020-03-24 NOTE — ED Triage Notes (Signed)
Presents with left knee swelling  States she noticed swelling and discomfort about 1 week ago  Denies any injury

## 2020-03-24 NOTE — Discharge Instructions (Signed)
No acute findings on x-ray and lab results today.  Follow discharge care instruction Wear knee brace for 3 to 5 days.  If no improvement follow orthopedic listed in your discharge care instructions.

## 2020-04-24 ENCOUNTER — Other Ambulatory Visit: Payer: Self-pay

## 2020-04-24 ENCOUNTER — Emergency Department
Admission: EM | Admit: 2020-04-24 | Discharge: 2020-04-24 | Disposition: A | Discharge status: Home / Self Care | Payer: Medicaid Other | Attending: Emergency Medicine | Admitting: Emergency Medicine

## 2020-04-24 ENCOUNTER — Encounter: Payer: Self-pay | Admitting: Emergency Medicine

## 2020-04-24 DIAGNOSIS — Z8541 Personal history of malignant neoplasm of cervix uteri: Secondary | ICD-10-CM | POA: Diagnosis not present

## 2020-04-24 DIAGNOSIS — J45909 Unspecified asthma, uncomplicated: Secondary | ICD-10-CM | POA: Insufficient documentation

## 2020-04-24 DIAGNOSIS — I10 Essential (primary) hypertension: Secondary | ICD-10-CM | POA: Diagnosis not present

## 2020-04-24 DIAGNOSIS — Z79899 Other long term (current) drug therapy: Secondary | ICD-10-CM | POA: Insufficient documentation

## 2020-04-24 DIAGNOSIS — R21 Rash and other nonspecific skin eruption: Secondary | ICD-10-CM | POA: Diagnosis present

## 2020-04-24 DIAGNOSIS — F17210 Nicotine dependence, cigarettes, uncomplicated: Secondary | ICD-10-CM | POA: Diagnosis not present

## 2020-04-24 DIAGNOSIS — B029 Zoster without complications: Secondary | ICD-10-CM | POA: Insufficient documentation

## 2020-04-24 MED ORDER — VALACYCLOVIR HCL 1 G PO TABS: 2000.0000 mg | ORAL_TABLET | Freq: Three times a day (TID) | ORAL | 0 refills | Status: AC

## 2020-04-24 NOTE — ED Provider Notes (Signed)
[facility]  ____________________________________________  Time seen: Approximately 4:20 PM  I have reviewed the triage vital signs and the nursing notes.   HISTORY  Chief Complaint Insect Bite   Historian Patient     HPI Cassidy Mathis is a 45 y.o. female presents to the emergency department with a burning vesicular rash along left posterior thigh that started 2 days ago.  Patient denies recent tick bites.  No prior history of shingles.  Rash is in a dermatomal distribution and does not cross midline.  No alleviating measures have been attempted.   Past Medical History:  Diagnosis Date  . Allergy    seasonal  . Anemia    h/o  . Asthma   . Cancer (HCC)    cervical  . Depression   . GERD (gastroesophageal reflux disease)    RARE-NO MEDS  . Headache    MIGRAINES  . Hypertension    PT STATES SHE IS SUPPOSED TO BE TAKING LISINOPRIL-HCTZ BUT HAS BEEN OUT "FOR A WHILE" NEEDS TO GET ANOTHER PRESCRIPTION FROM HER PCP  . Pancreatitis   . Pilonidal cyst    TOOK ANTIBIOTIC THIS MONTH AND IT IS RESOLVED  . Pneumonia 2018     Immunizations up to date:  Yes.     Past Medical History:  Diagnosis Date  . Allergy    seasonal  . Anemia    h/o  . Asthma   . Cancer (HCC)    cervical  . Depression   . GERD (gastroesophageal reflux disease)    RARE-NO MEDS  . Headache    MIGRAINES  . Hypertension    PT STATES SHE IS SUPPOSED TO BE TAKING LISINOPRIL-HCTZ BUT HAS BEEN OUT "FOR A WHILE" NEEDS TO GET ANOTHER PRESCRIPTION FROM HER PCP  . Pancreatitis   . Pilonidal cyst    TOOK ANTIBIOTIC THIS MONTH AND IT IS RESOLVED  . Pneumonia 2018    Patient Active Problem List   Diagnosis Date Noted  . Right ovarian cyst   . Cervical cancer, FIGO stage IB1 ([place]) 05/17/2017  . Ileus ([place]) 04/18/2017  . Postoperative anemia due to acute blood loss 04/18/2017  . Tobacco use 04/06/2017  . 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 Common bile duct stone   .  Pancreatitis 12/16/2016  . 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 Incarcerated epigastric hernia     Past Surgical History:  Procedure Laterality Date  . ABDOMINAL HYSTERECTOMY     partial  . CERVICAL CONIZATION W/BX N/A 03/23/2017   Procedure: CONIZATION CERVIX WITH BIOPSY;  Surgeon: Gillis Ends, MD;  Location: [facility] ORS;  Service: Gynecology;  Laterality: N/A;  . CHOLECYSTECTOMY  10/21/2015   Procedure: LAPAROSCOPIC CHOLECYSTECTOMY WITH INTRAOPERATIVE CHOLANGIOGRAM;  Surgeon: Jules Husbands, MD;  Location: [facility] ORS;  Service: General;;  . CHOLECYSTECTOMY Bilateral   . CYST EXCISION     tongue AND WRIST  . EPIGASTRIC HERNIA REPAIR N/A 04/18/2015   Procedure: HERNIA REPAIR EPIGASTRIC ADULT;  Surgeon: Jules Husbands, MD;  Location: [facility] ORS;  Service: General;  Laterality: N/A;  . ERCP N/A 12/21/2016   Procedure: ENDOSCOPIC RETROGRADE CHOLANGIOPANCREATOGRAPHY (ERCP);  Surgeon: [name], MD;  Location: [facility] ENDOSCOPY;  Service: Endoscopy;  Laterality: N/A;  . HERNIA REPAIR    . LAPAROSCOPIC SALPINGO OOPHERECTOMY Right 01/01/2020   Procedure: LAPAROSCOPIC SALPINGO OOPHORECTOMY;  Surgeon: Schermerhorn, Gwen Her, MD;  Location: [facility] ORS;  Service: Gynecology;  Laterality: Right;  . LYSIS OF ADHESION N/A  01/01/2020   Procedure: LYSIS OF ADHESION;  Surgeon: Schermerhorn, Gwen Her, MD;  Location: [facility] ORS;  Service: Gynecology;  Laterality: N/A;  . RADICAL HYSTERECTOMY  04/14/2017  . SALPINGECTOMY Bilateral 04/14/2017  . SENTINEL LYMPH NODE BIOPSY    . TUBAL LIGATION      Prior to Admission medications   Medication Sig Start Date End Date Taking? Authorizing Provider  valACYclovir (VALTREX) 1000 MG tablet Take 2 tablets (2,000 mg total) by mouth 3 (three) times daily for 7 days. 04/24/20 05/01/20 Yes Vallarie Mare M, PA-C  albuterol (PROVENTIL HFA;VENTOLIN HFA) 108 (90 Base) MCG/ACT inhaler Inhale 2 puffs into the lungs every 6 (six) hours as needed  for wheezing or shortness of breath. 02/28/18   Merlyn Lot, MD  gabapentin (NEURONTIN) 300 MG capsule Take 300 mg by mouth at bedtime.    [provider]  lisinopril (ZESTRIL) 40 MG tablet Take 1 tablet (40 mg total) by mouth every morning. 03/24/20   Sable Feil, PA-C  naproxen (NAPROSYN) 500 MG tablet Take 1 tablet (500 mg total) by mouth 2 (two) times daily with a meal. 03/24/20   Sable Feil, PA-C  oxyCODONE-acetaminophen (PERCOCET) 5-325 MG tablet Take 1 tablet by mouth every 6 (six) hours as needed for severe pain. 03/24/20 03/24/21  Sable Feil, PA-C    Allergies Patient has no known allergies.  Family History  Problem Relation Age of Onset  . Hypertension Mother   . Gallstones Mother   . Hypertension Father   . Gout Father   . Cancer Paternal Uncle        lung  . Cancer Maternal Grandmother        not sure what type of cancer  . Cancer Maternal Grandfather        not sure what type of cancer  . Cancer Paternal Grandmother        not sure what type of cancer  . Cancer Paternal Grandfather        not sure what type of cancer    Social History Social History   Tobacco Use  . Smoking status: Current Every Day Smoker    Packs/day: 0.50    Years: 12.00    Pack years: 6.00    Types: Cigarettes  . Smokeless tobacco: Never Used  Vaping Use  . Vaping Use: Never used  Substance Use Topics  . Alcohol use: No    Alcohol/week: 0.0 standard drinks  . Drug use: Yes    Types: Marijuana    Comment: "not often"     Review of Systems  Constitutional: No fever/chills Eyes:  No discharge ENT: No upper respiratory complaints. Respiratory: no cough. No SOB/ use of accessory muscles to breath Gastrointestinal:   No nausea, no vomiting.  No diarrhea.  No constipation. Musculoskeletal: Negative for musculoskeletal pain. Skin: Patient has an erythematous, vesicular rash along the left posterior  thigh.   ____________________________________________   PHYSICAL EXAM:  VITAL SIGNS: ED Triage Vitals  Enc Vitals Group     BP 04/24/20 1516 (!) 174/105     Pulse Rate 04/24/20 1516 66     Resp 04/24/20 1516 17     Temp --      Temp src --      SpO2 04/24/20 1516 97 %     Weight 04/24/20 1517 185 lb (83.9 kg)     Height 04/24/20 1517 5\' 5"  (1.651 m)     Head Circumference --      Peak  Flow --      Pain Score 04/24/20 1517 10     Pain Loc --      Pain Edu? --      Excl. in [place]? --      Constitutional: Alert and oriented. Well appearing and in no acute distress. Eyes: Conjunctivae are normal. PERRL. EOMI. Head: Atraumatic. ENT: Cardiovascular: Normal rate, regular rhythm. Normal S1 and S2.  Good peripheral circulation. Respiratory: Normal respiratory effort without tachypnea or retractions. Lungs CTAB. Good air entry to the bases with no decreased or absent breath sounds Gastrointestinal: Bowel sounds x 4 quadrants. Soft and nontender to palpation. No guarding or rigidity. No distention. Musculoskeletal: Full range of motion to all extremities. No obvious deformities noted Neurologic:  Normal for age. No gross focal neurologic deficits are appreciated.  Skin: Patient has a vesicular, erythematous rash in a dermatomal distribution that does not cross the midline along left posterior thigh. Psychiatric: Mood and affect are normal for age. Speech and behavior are normal.   ____________________________________________   LABS (all labs ordered are listed, but only abnormal results are displayed)  Labs Reviewed - No data to display ____________________________________________  EKG   ____________________________________________  RADIOLOGY  No results found.  ____________________________________________    PROCEDURES  Procedure(s) performed:     Procedures     Medications - No data to display   ____________________________________________   INITIAL  IMPRESSION / ASSESSMENT AND PLAN / ED COURSE  Pertinent labs & imaging results that were available during my care of the patient were reviewed by me and considered in my medical decision making (see chart for details).     Assessment and plan Shingles 45 year old female presents to the emergency department with physical exam findings suggestive of shingles.  Patient was started on Valtrex 3 times daily for the next 7 days.  Return precautions were given to return with new or worsening symptoms.      ____________________________________________  FINAL CLINICAL IMPRESSION(S) / ED DIAGNOSES  Final diagnoses:  Herpes zoster without complication      NEW MEDICATIONS STARTED DURING THIS VISIT:  ED Discharge Orders         Ordered    valACYclovir (VALTREX) 1000 MG tablet  3 times daily        04/24/20 1546              This chart was dictated using voice recognition software/Dragon. Despite best efforts to proofread, errors can occur which can change the meaning. Any change was purely unintentional.     Lannie Fields, PA-C 04/24/20 1623    Vanessa Sutherland, MD 04/25/20 [phone]

## 2020-04-24 NOTE — Discharge Instructions (Signed)
Take Valtrex three times daily for seven days

## 2020-04-24 NOTE — ED Notes (Signed)
Pt to ED c/o painful area on L posterior thigh, began feeling pain and swelling 2d ago. Became more painful today, describes pain as "burning". Area is about 1.5cm round and reddened with multiple areas that appear purulent.  EDP at bedside examining pt.

## 2020-04-24 NOTE — ED Triage Notes (Signed)
Pt comes into the ED via POV c/o spider bite to the posterior side of the left leg.  Pt ambulatory to triage and in NAD.

## 2020-11-08 ENCOUNTER — Emergency Department
Admission: EM | Admit: 2020-11-08 | Discharge: 2020-11-08 | Disposition: A | Discharge status: Home / Self Care | Payer: Medicaid Other | Attending: Student in an Organized Health Care Education/Training Program | Admitting: Student in an Organized Health Care Education/Training Program

## 2020-11-08 ENCOUNTER — Emergency Department: Payer: Medicaid Other

## 2020-11-08 ENCOUNTER — Other Ambulatory Visit: Payer: Self-pay

## 2020-11-08 ENCOUNTER — Encounter: Payer: Self-pay | Admitting: Emergency Medicine

## 2020-11-08 DIAGNOSIS — R002 Palpitations: Secondary | ICD-10-CM | POA: Diagnosis not present

## 2020-11-08 DIAGNOSIS — R0602 Shortness of breath: Secondary | ICD-10-CM | POA: Insufficient documentation

## 2020-11-08 DIAGNOSIS — I10 Essential (primary) hypertension: Secondary | ICD-10-CM | POA: Diagnosis not present

## 2020-11-08 DIAGNOSIS — J45909 Unspecified asthma, uncomplicated: Secondary | ICD-10-CM | POA: Diagnosis not present

## 2020-11-08 DIAGNOSIS — Z79899 Other long term (current) drug therapy: Secondary | ICD-10-CM | POA: Insufficient documentation

## 2020-11-08 DIAGNOSIS — R079 Chest pain, unspecified: Secondary | ICD-10-CM | POA: Diagnosis not present

## 2020-11-08 DIAGNOSIS — F17210 Nicotine dependence, cigarettes, uncomplicated: Secondary | ICD-10-CM | POA: Insufficient documentation

## 2020-11-08 DIAGNOSIS — Z85828 Personal history of other malignant neoplasm of skin: Secondary | ICD-10-CM | POA: Insufficient documentation

## 2020-11-08 LAB — TROPONIN I (HIGH SENSITIVITY)
Troponin I (High Sensitivity): 3 ng/L (ref <18)
Troponin I (High Sensitivity): 3 ng/L (ref <18)

## 2020-11-08 LAB — CBC
HCT: 38.3 % (ref 36.0–46.0)
Hemoglobin: 13.1 g/dL (ref 12.0–15.0)
MCH: 30.8 pg (ref 26.0–34.0)
MCHC: 34.2 g/dL (ref 30.0–36.0)
MCV: 89.9 fL (ref 80.0–100.0)
Platelets: 414 10*3/uL — ABNORMAL HIGH (ref 150–400)
RBC: 4.26 MIL/uL (ref 3.87–5.11)
RDW: 12.9 % (ref 11.5–15.5)
WBC: 8.6 10*3/uL (ref 4.0–10.5)
nRBC: 0 % (ref 0.0–0.2)

## 2020-11-08 LAB — COMPREHENSIVE METABOLIC PANEL
ALT: 18 U/L (ref 0–44)
AST: 21 U/L (ref 15–41)
Albumin: 4.2 g/dL (ref 3.5–5.0)
Alkaline Phosphatase: 64 U/L (ref 38–126)
Anion gap: 8 (ref 5–15)
BUN: 11 mg/dL (ref 6–20)
CO2: 26 mmol/L (ref 22–32)
Calcium: 9.2 mg/dL (ref 8.9–10.3)
Chloride: 102 mmol/L (ref 98–111)
Creatinine, Ser: 0.78 mg/dL (ref 0.44–1.00)
GFR, Estimated: >60 mL/min (ref >60)
Glucose, Bld: 84 mg/dL (ref 70–99)
Potassium: 3.9 mmol/L (ref 3.5–5.1)
Sodium: 136 mmol/L (ref 135–145)
Total Bilirubin: 0.4 mg/dL (ref 0.3–1.2)
Total Protein: 7.9 g/dL (ref 6.5–8.1)

## 2020-11-08 MED ORDER — OXYCODONE-ACETAMINOPHEN 5-325 MG PO TABS
1.0000 | ORAL_TABLET | Freq: Once | ORAL | Status: AC
  Administered 2020-11-08: 1 via ORAL
  Filled 2020-11-08: qty 1

## 2020-11-08 MED ORDER — ISOSORBIDE MONONITRATE ER 30 MG PO TB24: 30.0000 mg | ORAL_TABLET | Freq: Every day | ORAL | 0 refills | Status: DC

## 2020-11-08 MED ORDER — ASPIRIN 81 MG PO CHEW
324.0000 mg | CHEWABLE_TABLET | Freq: Once | ORAL | Status: AC
  Administered 2020-11-08: 324 mg via ORAL
  Filled 2020-11-08: qty 4

## 2020-11-08 MED ORDER — ISOSORBIDE MONONITRATE ER 60 MG PO TB24
30.0000 mg | ORAL_TABLET | Freq: Every day | ORAL | Status: DC
  Administered 2020-11-08: 30 mg via ORAL
  Filled 2020-11-08: qty 1

## 2020-11-08 MED ORDER — AMLODIPINE BESYLATE 5 MG PO TABS
5.0000 mg | ORAL_TABLET | Freq: Once | ORAL | Status: AC
  Administered 2020-11-08: 5 mg via ORAL
  Filled 2020-11-08: qty 1

## 2020-11-08 MED ORDER — LIDOCAINE VISCOUS HCL 2 % MT SOLN
15.0000 mL | Freq: Once | OROMUCOSAL | Status: AC
  Administered 2020-11-08: 15 mL via ORAL
  Filled 2020-11-08: qty 15

## 2020-11-08 MED ORDER — ALUM & MAG HYDROXIDE-SIMETH 200-200-20 MG/5ML PO SUSP
30.0000 mL | Freq: Once | ORAL | Status: AC
  Administered 2020-11-08: 30 mL via ORAL
  Filled 2020-11-08: qty 30

## 2020-11-08 MED ORDER — NITROGLYCERIN 0.4 MG SL SUBL
0.4000 mg | SUBLINGUAL_TABLET | SUBLINGUAL | Status: DC | PRN
  Administered 2020-11-08: 0.4 mg via SUBLINGUAL
  Filled 2020-11-08: qty 1

## 2020-11-08 NOTE — ED Notes (Signed)
Pt reports history of CP; states has been going on "for years"; states primary doc thought it had to do with diet and reflux but those meds haven't helped; reports becomes excessively sweaty, heart racing, nausea, and chest pressure. Reports sometimes laying on belly on the ground helps. Reports history of HTN and that she takes BP meds for this.

## 2020-11-08 NOTE — ED Notes (Signed)
EDP [place] notified via secure chat that 2nd trop neg; pt denies CP, remains hypertensive, and is anxious to leave. Awaiting orders.

## 2020-11-08 NOTE — ED Notes (Signed)
Blue, lav, lt grn, red tubes sent to lab.

## 2020-11-08 NOTE — ED Notes (Signed)
Pt's skin dry; resp reg/unlabored; sitting calmly on stretcher; reports chest pressure "comes out of nowhere"; reports history of anxiety attacks and that stopped taking meds for this sometime before these episodes started happening.

## 2020-11-08 NOTE — ED Notes (Signed)
Pt reports is on lisinopril and something new as of last few months that starts with an "m". Pt unsure if metoprolol.

## 2020-11-08 NOTE — ED Notes (Signed)
Pt requesting pain med for chest; states pressure is increasing. Provider Quentin Cornwall notified via secure chat. Pt's skin dry; resp reg/unlabored; laying calmly on stretcher.

## 2020-11-08 NOTE — ED Provider Notes (Signed)
[facility] Emergency Department Provider Note    Event Date/Time   First MD Initiated Contact with Patient 11/08/20 1757     (approximate)  I have reviewed the triage vital signs and the nursing notes.   HISTORY  Chief Complaint Chest Pain and Shortness of Breath    HPI Cassidy Mathis is a 45 y.o. female presents to the ER for evaluation of chest pain and pressure states been ongoing off and on for for years but is now worrying her and wants to get checked up. States that she feels sweaty feels like she is having pressure and palpitations.  Does have a history of blood pressure.  Denies any pain when taking deep inspiration no lower extremity swelling.  Denies any birth control use.  No history of heart attack.  No pain ripping or tearing through to her back.  And states that she is always stressed out.  Past Medical History:  Diagnosis Date   Allergy    seasonal   Anemia    h/o   Asthma    Cancer (HCC)    cervical   Depression    GERD (gastroesophageal reflux disease)    RARE-NO MEDS   Headache    MIGRAINES   Hypertension    PT STATES SHE IS SUPPOSED TO BE TAKING LISINOPRIL-HCTZ BUT HAS BEEN OUT "FOR A WHILE" NEEDS TO GET ANOTHER PRESCRIPTION FROM HER PCP   Pancreatitis    Pilonidal cyst    TOOK ANTIBIOTIC THIS MONTH AND IT IS RESOLVED   Pneumonia 2018   Family History  Problem Relation Age of Onset   Hypertension Mother    Gallstones Mother    Hypertension Father    Gout Father    Cancer Paternal Uncle        lung   Cancer Maternal Grandmother        not sure what type of cancer   Cancer Maternal Grandfather        not sure what type of cancer   Cancer Paternal Grandmother        not sure what type of cancer   Cancer Paternal Grandfather        not sure what type of cancer   Past Surgical History:  Procedure Laterality Date   ABDOMINAL HYSTERECTOMY     partial   CERVICAL CONIZATION W/BX N/A 03/23/2017   Procedure:  CONIZATION CERVIX WITH BIOPSY;  Surgeon: Gillis Ends, MD;  Location: [facility] ORS;  Service: Gynecology;  Laterality: N/A;   CHOLECYSTECTOMY  10/21/2015   Procedure: LAPAROSCOPIC CHOLECYSTECTOMY WITH INTRAOPERATIVE CHOLANGIOGRAM;  Surgeon: Jules Husbands, MD;  Location: [facility] ORS;  Service: General;;   CHOLECYSTECTOMY Bilateral    CYST EXCISION     tongue AND WRIST   EPIGASTRIC HERNIA REPAIR N/A 04/18/2015   Procedure: HERNIA REPAIR EPIGASTRIC ADULT;  Surgeon: Jules Husbands, MD;  Location: [facility] ORS;  Service: General;  Laterality: N/A;   ERCP N/A 12/21/2016   Procedure: ENDOSCOPIC RETROGRADE CHOLANGIOPANCREATOGRAPHY (ERCP);  Surgeon: [name], MD;  Location: [facility] ENDOSCOPY;  Service: Endoscopy;  Laterality: N/A;   HERNIA REPAIR     LAPAROSCOPIC SALPINGO OOPHERECTOMY Right 01/01/2020   Procedure: LAPAROSCOPIC SALPINGO OOPHORECTOMY;  Surgeon: Schermerhorn, Gwen Her, MD;  Location: [facility] ORS;  Service: Gynecology;  Laterality: Right;   LYSIS OF ADHESION N/A 01/01/2020   Procedure: LYSIS OF ADHESION;  Surgeon: Schermerhorn, Gwen Her, MD;  Location: [facility] ORS;  Service: Gynecology;  Laterality: N/A;   RADICAL HYSTERECTOMY  04/14/2017   SALPINGECTOMY Bilateral 04/14/2017   SENTINEL LYMPH NODE BIOPSY     TUBAL LIGATION     Patient Active Problem List   Diagnosis Date Noted   Right ovarian cyst    Cervical cancer, FIGO stage IB1 ([place]) 05/17/2017   Ileus ([place]) 04/18/2017   Postoperative anemia due to acute blood loss 04/18/2017   Tobacco use 04/06/2017   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 Authorizing Provider  isosorbide mononitrate (IMDUR) 30 MG 24 hr tablet Take 1 tablet (30 mg total) by mouth daily. 11/08/20 11/08/21 Yes Merlyn Lot,  MD  albuterol (PROVENTIL HFA;VENTOLIN HFA) 108 (90 Base) MCG/ACT inhaler Inhale 2 puffs into the lungs every 6 (six) hours as needed for wheezing or shortness of breath. 02/28/18   Merlyn Lot, MD  gabapentin (NEURONTIN) 300 MG capsule Take 300 mg by mouth at bedtime.    [provider]  lisinopril (ZESTRIL) 40 MG tablet Take 1 tablet (40 mg total) by mouth every morning. 03/24/20   Sable Feil, PA-C  naproxen (NAPROSYN) 500 MG tablet Take 1 tablet (500 mg total) by mouth 2 (two) times daily with a meal. 03/24/20   Sable Feil, PA-C  oxyCODONE-acetaminophen (PERCOCET) 5-325 MG tablet Take 1 tablet by mouth every 6 (six) hours as needed for severe pain. 03/24/20 03/24/21  Sable Feil, PA-C    Allergies Patient has no known allergies.    Social History Social History   Tobacco Use   Smoking status: Every Day    Packs/day: 0.50    Years: 12.00    Pack years: 6.00    Types: Cigarettes   Smokeless tobacco: Never  Vaping Use   Vaping Use: Never used  Substance Use Topics   Alcohol use: No    Alcohol/week: 0.0 standard drinks   Drug use: Yes    Types: Marijuana    Comment: "not often"    Review of Systems Patient denies headaches, rhinorrhea, blurry vision, numbness, shortness of breath, chest pain, edema, cough, abdominal pain, nausea, vomiting, diarrhea, dysuria, fevers, rashes or hallucinations unless otherwise stated above in HPI. ____________________________________________   PHYSICAL EXAM:  VITAL SIGNS: Vitals:   11/08/20 1922 11/08/20 1932  BP: (!) 176/101 (!) 184/107  Pulse: 65 67  Resp: 15 16  Temp:  98.8 F (37.1 C)  SpO2: 99% 99%    Constitutional: Alert and oriented.  Eyes: Conjunctivae are normal.  Head: Atraumatic. Nose: No congestion/rhinnorhea. Mouth/Throat: Mucous membranes are moist.   Neck: No stridor. Painless ROM.  Cardiovascular: Normal rate, regular rhythm. Grossly normal heart sounds.  Good peripheral  circulation. Respiratory: Normal respiratory effort.  No retractions. Lungs CTAB. Gastrointestinal: Soft and nontender. No distention. No abdominal bruits. No CVA tenderness. Genitourinary:  Musculoskeletal: No lower extremity tenderness nor edema.  No joint effusions. Neurologic:  Normal speech and language. No gross focal neurologic deficits are appreciated. No facial droop Skin:  Skin is warm, dry and intact. No rash noted. Psychiatric: Mood and affect are normal. Speech and behavior are normal.  ____________________________________________   LABS (all labs ordered are listed, but only abnormal results are displayed)  Results for orders placed or performed during the hospital encounter of 11/08/20 (from the past 24 hour(s))  CBC  Status: Abnormal   Collection Time: 11/08/20  6:08 PM  Result Value Ref Range   WBC 8.6 4.0 - 10.5 K/uL   RBC 4.26 3.87 - 5.11 MIL/uL   Hemoglobin 13.1 12.0 - 15.0 g/dL   HCT 38.3 36.0 - 46.0 %   MCV 89.9 80.0 - 100.0 fL   MCH 30.8 26.0 - 34.0 pg   MCHC 34.2 30.0 - 36.0 g/dL   RDW 12.9 11.5 - 15.5 %   Platelets 414 (H) 150 - 400 K/uL   nRBC 0.0 0.0 - 0.2 %  Comprehensive metabolic panel     Status: None   Collection Time: 11/08/20  6:08 PM  Result Value Ref Range   Sodium 136 135 - 145 mmol/L   Potassium 3.9 3.5 - 5.1 mmol/L   Chloride 102 98 - 111 mmol/L   CO2 26 22 - 32 mmol/L   Glucose, Bld 84 70 - 99 mg/dL   BUN 11 6 - 20 mg/dL   Creatinine, Ser 0.78 0.44 - 1.00 mg/dL   Calcium 9.2 8.9 - 10.3 mg/dL   Total Protein 7.9 6.5 - 8.1 g/dL   Albumin 4.2 3.5 - 5.0 g/dL   AST 21 15 - 41 U/L   ALT 18 0 - 44 U/L   Alkaline Phosphatase 64 38 - 126 U/L   Total Bilirubin 0.4 0.3 - 1.2 mg/dL   GFR, Estimated >60 >60 mL/min   Anion gap 8 5 - 15  Troponin I (High Sensitivity)     Status: None   Collection Time: 11/08/20  6:08 PM  Result Value Ref Range   Troponin I (High Sensitivity) 3 <18 ng/L    ____________________________________________  EKG My review and personal interpretation at Time: 15:43   Indication: chest pain  Rate: 70  Rhythm: sinus Axis: normal Other: normal intervals, no stemi, no depressions ____________________________________________  RADIOLOGY  I personally reviewed all radiographic images ordered to evaluate for the above acute complaints and reviewed radiology reports and findings.  These findings were personally discussed with the patient.  Please see medical record for radiology report.  ____________________________________________   PROCEDURES  Procedure(s) performed:  Procedures    Critical Care performed: no ____________________________________________   INITIAL IMPRESSION / ASSESSMENT AND PLAN / ED COURSE  Pertinent labs & imaging results that were available during my care of the patient were reviewed by me and considered in my medical decision making (see chart for details).   DDX: ACS, pericarditis, esophagitis,  pe, dissection, pna, bronchitis, costochondritis    [place] L States is a 45 y.o. who presents to the ED with presentation as described above.  Patient nontoxic-appearing she is hypertensive does appear somewhat anxious.  Her EKG is nonischemic.  No sign of Brugada.  PR interval normal.  She is low risk by heart score but given her hypertension and pain will order serial enzymes.  Had some improvement GI cocktail has a history of reflux may be a component of that I do not appreciate any wheezing on exam.  Not consistent with dissection.  She is low risk by Wells criteria and is PERC negative.  Clinical Course as of 11/08/20 2014  Sat Nov 08, 2020  2010 Patient's work-up is grossly reassuring.  Repeat abdominal exam soft and benign.  States that there is no aggravating or alleviating factors and reiterates that this is been ongoing for several weeks to months but becoming more frequent over the past week.  States that she is  always stressed out.  She is currently well-appearing.  Did feel some mild change after nitro she is hypertensive may be having some hypertensive strain balance any sign of CHF.  Discussed recommendation for admission to the hospital for chest pain rule out but patient reluctant to do this stating that she prefer to follow-up as an outpatient.  As her cardiac enzyme is negative and EKG is nonischemic and she is currently pain-free discussed risks and benefits and patient agreeable to plan to stay for repeat cardiac enzyme and as long as that is stable and she is not having any pain she can follow-up in cardiology.  I discussed this plan with Dr. Curt Bears of cardiology and agree with plan to start her on Imdur as well as aspirin and close outpatient follow-up.   [PR]    Clinical Course User Index [PR] Merlyn Lot, MD    The patient was evaluated in Emergency Department today for the symptoms described in the history of present illness. He/she was evaluated in the context of the global COVID-19 pandemic, which necessitated consideration that the patient might be at risk for infection with the SARS-CoV-2 virus that causes COVID-19. Institutional protocols and algorithms that pertain to the evaluation of patients at risk for COVID-19 are in a state of rapid change based on information released by regulatory bodies including the CDC and federal and state organizations. These policies and algorithms were followed during the patient's care in the ED.  As part of my medical decision making, I reviewed the following data within the [place] notes reviewed and incorporated, Labs reviewed, notes from prior ED visits and [place] Controlled Substance Database   ____________________________________________   FINAL CLINICAL IMPRESSION(S) / ED DIAGNOSES  Final diagnoses:  Chest pain, unspecified type      NEW MEDICATIONS STARTED DURING THIS VISIT:  New Prescriptions   ISOSORBIDE  MONONITRATE (IMDUR) 30 MG 24 HR TABLET    Take 1 tablet (30 mg total) by mouth daily.     Note:  This document was prepared using Dragon voice recognition software and may include unintentional dictation errors.    Merlyn Lot, MD 11/08/20 2014

## 2020-11-08 NOTE — ED Triage Notes (Signed)
Pt reports cp to mid chest that is pressure like in nature. Pt reports pain is nonradiating and she feels SOB with it. Pt reports has this issue intermittently for years but has become more frequent lately.

## 2020-11-08 NOTE — ED Provider Notes (Signed)
-----------------------------------------   8:19 PM on 11/08/2020 -----------------------------------------  Blood pressure (!) 184/107, pulse 67, temperature 98.8 F (37.1 C), temperature source Oral, resp. rate 16, height 5\' 5"  (1.651 m), last menstrual period 03/18/2017, SpO2 99 %.  Assuming care from Dr. Quentin Cornwall.  In short, Cassidy Mathis is a 45 y.o. female with a chief complaint of Chest Pain and Shortness of Breath .  Refer to the original H&P for additional details.  The current plan of care is to follow-up repeat troponin, if negative patient would be appropriate for discharge home.  ----------------------------------------- 9:37 PM on 11/08/2020 ----------------------------------------- Repeat troponin is within normal limits, patient is appropriate for discharge home with cardiology follow-up as arranged by Dr. Quentin Cornwall.  She was counseled to return to the ED for new worsening symptoms, patient agrees with plan.    Blake Divine, MD 11/08/20 2137

## 2020-11-28 ENCOUNTER — Other Ambulatory Visit: Payer: Self-pay

## 2020-11-28 ENCOUNTER — Ambulatory Visit: Payer: Medicaid Other | Admitting: Internal Medicine

## 2020-11-28 ENCOUNTER — Encounter: Payer: Self-pay | Admitting: Internal Medicine

## 2020-11-28 VITALS — BP 160/98 | HR 62 | Ht 65.0 in | Wt 223.0 lb

## 2020-11-28 DIAGNOSIS — Z01812 Encounter for preprocedural laboratory examination: Secondary | ICD-10-CM

## 2020-11-28 DIAGNOSIS — R011 Cardiac murmur, unspecified: Secondary | ICD-10-CM | POA: Diagnosis not present

## 2020-11-28 DIAGNOSIS — R002 Palpitations: Secondary | ICD-10-CM | POA: Diagnosis not present

## 2020-11-28 DIAGNOSIS — I10 Essential (primary) hypertension: Secondary | ICD-10-CM | POA: Diagnosis not present

## 2020-11-28 DIAGNOSIS — R072 Precordial pain: Secondary | ICD-10-CM

## 2020-11-28 MED ORDER — ASPIRIN EC 81 MG PO TBEC: 81.0000 mg | DELAYED_RELEASE_TABLET | Freq: Every day | ORAL | Status: DC

## 2020-11-28 MED ORDER — METOPROLOL TARTRATE 50 MG PO TABS: 50.0000 mg | ORAL_TABLET | Freq: Once | ORAL | 0 refills | Status: DC

## 2020-11-28 MED ORDER — AMLODIPINE BESYLATE 5 MG PO TABS: 5.0000 mg | ORAL_TABLET | Freq: Every day | ORAL | 1 refills | Status: DC

## 2020-11-28 NOTE — Patient Instructions (Signed)
Medication Instructions:   Your physician has recommended you make the following change in your medication:   START Aspirin 81 mg daily - you may purchase this over-the-counter  START Amlodipine 5 mg daily - An Rx has been sent to your pharmacy  *If you need a refill on your cardiac medications before your next appointment, please call your pharmacy*   Lab Work:  BMET today  Testing/Procedures:  1) Your physician has requested that you have an echocardiogram. Echocardiography is a painless test that uses sound waves to create images of your heart. It provides your doctor with information about the size and shape of your heart and how well your heart's chambers and valves are working. This procedure takes approximately one hour. There are no restrictions for this procedure.  2) Your cardiac CT has been scheduled Monday 12/15/20 at 1:30 PM at the below location:   [facility] [place], [place] [address] [phone]   If scheduled at [facility], please arrive 15 mins early for check-in and test prep.  Please follow these instructions carefully (unless otherwise directed):   On the Night Before the Test: Be sure to Drink plenty of water. Do not consume any caffeinated/decaffeinated beverages or chocolate 12 hours prior to your test. Do not take any antihistamines 12 hours prior to your test.   On the Day of the Test: Drink plenty of water until 1 hour prior to the test. Do not eat any food 4 hours prior to the test. You may take your regular medications prior to the test.  Take metoprolol (Lopressor) two hours prior to test. FEMALES- please wear underwire-free bra if available, avoid dresses & tight clothing       After the Test: Drink plenty of water. After receiving IV contrast, you may experience a mild flushed feeling. This is normal. On occasion, you may experience a mild rash up  to 24 hours after the test. This is not dangerous. If this occurs, you can take Benadryl 25 mg and increase your fluid intake. If you experience trouble breathing, this can be serious. If it is severe call 911 IMMEDIATELY. If it is mild, please call our office. If you take any of these medications: Glipizide/Metformin, Avandament, Glucavance, please do not take 48 hours after completing test unless otherwise instructed.  Please allow 2-4 weeks for scheduling of routine cardiac CTs. Some insurance companies require a pre-authorization which may delay scheduling of this test.   For non-scheduling related questions, please contact the cardiac imaging nurse navigator should you have any questions/concerns: Marchia Bond, Cardiac Imaging Nurse Navigator Gordy Clement, Cardiac Imaging Nurse Navigator [facility] Direct Office Dial: [phone]   For scheduling needs, including cancellations and rescheduling, please call [place], [phone].    Follow-Up: At [facility], you and your health needs are our priority.  As part of our continuing mission to provide you with exceptional heart care, we have created designated Provider Care Teams.  These Care Teams include your primary Cardiologist (physician) and Advanced Practice Providers (APPs -  Physician Assistants and Nurse Practitioners) who all work together to provide you with the care you need, when you need it.  We recommend signing up for the patient portal called "MyChart".  Sign up information is provided on this After Visit Summary.  MyChart is used to connect with patients for Virtual Visits (Telemedicine).  Patients are able to view lab/test results, encounter notes, upcoming appointments,  etc.  Non-urgent messages can be sent to your provider as well.   To learn more about what you can do with MyChart, go to [email or web address].    Your next appointment:   1 month(s)  The format for your next  appointment:   In Person  Provider:   You may see Dr. Harrell Gave End or one of the following Advanced Practice Providers on your designated Care Team:   Murray Hodgkins, NP Christell Faith, PA-C Cadence Kathlen Mody, [place]

## 2020-11-28 NOTE — Progress Notes (Signed)
New Outpatient Visit Date: 11/28/2020  Primary Care provider: [facility] [place] [employer],  [place] [address]  Chief Complaint: Chest pain  HPI:  Ms. Belnap is a 45 y.o. female who is being seen today for the evaluation of chest pain after presenting to the [facility] emergency department on 11/08/2020 complaining of chest pain.  At that time, she reported having off-and-on chest pain for years but started becoming worried about it and decided she wanted to have it checked out.  She was noted to be hypertensive and anxious appearing by the ED provider.  Work-up was reassuring.  She was discharged with isosorbide mononitrate and advised to follow-up with cardiology.  She has a history of hypertension, asthma, anemia, cervical cancer, GERD, and pancreatitis.   Ms. Shuttleworth reports that she has had chest pain for at least a year to, which she describes as a pressure-like sensation in the center of her chest.  Over the last 2 months, it has become much more frequent and severe.  She now has mild discomfort all the time with intermittent flares that can be quite severe.  The only thing that seems to help it is lying prone on the ground and putting most of her weight on her chest.  This oftentimes will ease the discomfort after a few minutes, though episodes can last up to an hour.  She notes an associated "nervous" feeling with some palpitations as well as diaphoresis.  She did not tolerate isosorbide mononitrate that was prescribed after her ED visit due to headaches.  It also did not seem to help her chest pain.  Ms. Lovins denies shortness of breath or lightheadedness.  Her ankles and hands seem to swell at times.  She has put on quite a bit of weight over the last few months for unclear reasons.  She was recently taken off of amlodipine by her PCP and switched to metoprolol with subsequent dose escalation, as her PCP felt like the amlodipine was not working to control  her blood pressure.  Ms. Temple Pacini notes that she was first diagnosed with hypertension in her late 49s or early 59s.  She denies having undergone heart testing in the past.  She quit smoking following her ED visit.  --------------------------------------------------------------------------------------------------  Cardiovascular History & Procedures: Cardiovascular Problems: Chest pain  Risk Factors: Hypertension and tobacco use  Cath/PCI: None  CV Surgery: None  EP Procedures and Devices: None  Non-Invasive Evaluation(s): None  Recent CV Pertinent Labs: Lab Results  Component Value Date   CHOL 160 11/16/2017   HDL 75 11/16/2017   LDLCALC 69 11/16/2017   TRIG 75 11/16/2017   CHOLHDL 2.1 11/16/2017   INR 0.93 03/22/2017   K 3.9 11/08/2020   K 3.2 (L) 08/25/2012   BUN 11 11/08/2020   BUN 9 08/25/2012   CREATININE 0.78 11/08/2020   CREATININE 0.88 11/16/2017    --------------------------------------------------------------------------------------------------  Past Medical History:  Diagnosis Date   Allergy    seasonal   Anemia    h/o   Asthma    Cancer (HCC)    cervical   Depression    GERD (gastroesophageal reflux disease)    RARE-NO MEDS   Headache    MIGRAINES   Hypertension    PT STATES SHE IS SUPPOSED TO BE TAKING LISINOPRIL-HCTZ BUT HAS BEEN OUT "FOR A WHILE" NEEDS TO GET ANOTHER PRESCRIPTION FROM HER PCP   Pancreatitis    Pilonidal cyst    TOOK ANTIBIOTIC THIS MONTH  AND IT IS RESOLVED   Pneumonia 2018    Past Surgical History:  Procedure Laterality Date   ABDOMINAL HYSTERECTOMY     partial   CERVICAL CONIZATION W/BX N/A 03/23/2017   Procedure: CONIZATION CERVIX WITH BIOPSY;  Surgeon: Gillis Ends, MD;  Location: [facility] ORS;  Service: Gynecology;  Laterality: N/A;   CHOLECYSTECTOMY  10/21/2015   Procedure: LAPAROSCOPIC CHOLECYSTECTOMY WITH INTRAOPERATIVE CHOLANGIOGRAM;  Surgeon: Jules Husbands, MD;  Location: [facility] ORS;  Service: General;;    CHOLECYSTECTOMY Bilateral    CYST EXCISION     tongue AND WRIST   EPIGASTRIC HERNIA REPAIR N/A 04/18/2015   Procedure: HERNIA REPAIR EPIGASTRIC ADULT;  Surgeon: Jules Husbands, MD;  Location: [facility] ORS;  Service: General;  Laterality: N/A;   ERCP N/A 12/21/2016   Procedure: ENDOSCOPIC RETROGRADE CHOLANGIOPANCREATOGRAPHY (ERCP);  Surgeon: [name], MD;  Location: [facility] ENDOSCOPY;  Service: Endoscopy;  Laterality: N/A;   HERNIA REPAIR     LAPAROSCOPIC SALPINGO OOPHERECTOMY Right 01/01/2020   Procedure: LAPAROSCOPIC SALPINGO OOPHORECTOMY;  Surgeon: Schermerhorn, Gwen Her, MD;  Location: [facility] ORS;  Service: Gynecology;  Laterality: Right;   LYSIS OF ADHESION N/A 01/01/2020   Procedure: LYSIS OF ADHESION;  Surgeon: Schermerhorn, Gwen Her, MD;  Location: [facility] ORS;  Service: Gynecology;  Laterality: N/A;   RADICAL HYSTERECTOMY  04/14/2017   SALPINGECTOMY Bilateral 04/14/2017   SENTINEL LYMPH NODE BIOPSY     TUBAL LIGATION      Current Meds  Medication Sig   albuterol (PROVENTIL HFA;VENTOLIN HFA) 108 (90 Base) MCG/ACT inhaler Inhale 2 puffs into the lungs every 6 (six) hours as needed for wheezing or shortness of breath.   lisinopril (ZESTRIL) 40 MG tablet Take 1 tablet (40 mg total) by mouth every morning.   naproxen (NAPROSYN) 500 MG tablet Take 1 tablet (500 mg total) by mouth 2 (two) times daily with a meal.    Allergies: Patient has no known allergies.  Social History   Tobacco Use   Smoking status: Some Days    Packs/day: 0.50    Years: 12.00    Pack years: 6.00    Types: Cigarettes   Smokeless tobacco: Never   Tobacco comments:    11/28/2020 last smoked cigarettes 10 days ago  Vaping Use   Vaping Use: Never used  Substance Use Topics   Alcohol use: Yes    Comment: occassionally; less than 1/month   Drug use: Yes    Frequency: 1.0 times per week    Types: Marijuana    Family History  Problem Relation Age of Onset   Hypertension Mother    Gallstones Mother     Hypertension Father    Gout Father    Cancer Paternal Uncle        lung   Cancer Maternal Grandmother        not sure what type of cancer   Cancer Maternal Grandfather        not sure what type of cancer   Cancer Paternal Grandmother        not sure what type of cancer   Cancer Paternal Grandfather        not sure what type of cancer    Review of Systems: A 12-system review of systems was performed and was negative except as noted in the HPI.  --------------------------------------------------------------------------------------------------  Physical Exam: BP (!) 160/98 (BP Location: Right Arm, Patient Position: Sitting, Cuff Size: Large)   Pulse 62   Ht 5\' 5"  (1.651 m)   Wt 223  lb (101.2 kg)   LMP 03/18/2017 (Exact Date)   SpO2 98%   BMI 37.11 kg/m   General: NAD. HEENT: No conjunctival pallor or scleral icterus. Facemask in place. Neck: Supple without lymphadenopathy, thyromegaly, JVD, or HJR. No carotid bruit. Lungs: Normal work of breathing. Clear to auscultation bilaterally without wheezes or crackles. Heart: Regular rate and rhythm with 2/6 holosystolic murmur loudest at the left lower sternal border.  No rubs or gallops.  Unable to assess PMI due to body habitus. Abd: Bowel sounds present. Soft, NT/ND without hepatosplenomegaly Ext: No lower extremity edema. Radial, PT, and DP pulses are 2+ bilaterally Skin: Warm and dry without rash. Neuro: CNIII-XII intact. Strength and fine-touch sensation intact in upper and lower extremities bilaterally. Psych: Normal mood and affect.  EKG: Normal sinus rhythm without abnormality.  Lab Results  Component Value Date   WBC 8.6 11/08/2020   HGB 13.1 11/08/2020   HCT 38.3 11/08/2020   MCV 89.9 11/08/2020   PLT 414 (H) 11/08/2020    Lab Results  Component Value Date   NA 136 11/08/2020   K 3.9 11/08/2020   CL 102 11/08/2020   CO2 26 11/08/2020   BUN 11 11/08/2020   CREATININE 0.78 11/08/2020   GLUCOSE 84 11/08/2020    ALT 18 11/08/2020    Lab Results  Component Value Date   CHOL 160 11/16/2017   HDL 75 11/16/2017   LDLCALC 69 11/16/2017   TRIG 75 11/16/2017   CHOLHDL 2.1 11/16/2017     --------------------------------------------------------------------------------------------------  ASSESSMENT AND PLAN: Atypical chest pain and palpitations: Symptoms have been present for years but seems to have worsened over the last 2 months.  Pain is atypical with some component always present.  She has random flares that seem to improve with application of pressure to the anterior chest.  Recent ED evaluation was unremarkable other than elevated blood pressure that is noted again today.  Her physical exam today is unremarkable with the exception of a soft systolic murmur.  EKG is also normal.  Cardiac risk factors include hypertension, tobacco use, and obesity.  We have agreed to obtain an echocardiogram and coronary CTA for further evaluation.  Of note, CTA chest to evaluate for pulmonary embolism in 02/2018 showed no coronary artery calcification on my review.  I will also add amlodipine 5 mg daily to improve blood pressure control and for antianginal therapy, as she did not tolerate isosorbide mononitrate due to headaches.  I have also asked her to begin taking aspirin 81 mg daily pending coronary CTA.  If no significant ASCVD is identified, I would favor discontinuation of aspirin at that point.  Should symptoms persist despite reassuring echo and CTA, 14-day event monitor will need to be considered in case transient arrhythmia is contributing to the patient's chest pain and palpitations.  Heart murmur: Incidentally noted on examination today.  No signs of overt heart failure today.  EKG also unremarkable.  Given aforementioned symptoms, we have agreed to obtain an echocardiogram for further assessment.  Hypertension: Blood pressure moderately elevated today.  We have agreed to add amlodipine 5 mg daily with  continuation of lisinopril 40 mg daily and metoprolol succinate 50 mg daily.  Addition of a thiazide diuretic in the future may also be beneficial.  If blood pressure remains difficult to control in spite of these measures, work-up for secondary hypertension will need to be considered, including sleep study to exclude obstructive sleep apnea.  Follow-up: Return to clinic in 1 month.  Harrell Gave  [place]cola A[place], MD 11/28/2020 9:16 AM

## 2020-11-29 LAB — BASIC METABOLIC PANEL
BUN/Creatinine Ratio: 8 — ABNORMAL LOW (ref 9–23)
BUN: 8 mg/dL (ref 6–24)
CO2: 26 mmol/L (ref 20–29)
Calcium: 9.6 mg/dL (ref 8.7–10.2)
Chloride: 104 mmol/L (ref 96–106)
Creatinine, Ser: 0.96 mg/dL (ref 0.57–1.00)
Glucose: 88 mg/dL (ref 70–99)
Potassium: 4.5 mmol/L (ref 3.5–5.2)
Sodium: 140 mmol/L (ref 134–144)
eGFR: 75 mL/min/{1.73_m2} (ref >59)

## 2020-12-04 ENCOUNTER — Inpatient Hospital Stay
Admission: EM | Admit: 2020-12-04 | Discharge: 2020-12-13 | DRG: 357 | Disposition: A | Discharge status: Home / Self Care | Payer: Medicaid Other | Attending: Internal Medicine | Admitting: Internal Medicine

## 2020-12-04 ENCOUNTER — Other Ambulatory Visit: Payer: Self-pay

## 2020-12-04 ENCOUNTER — Emergency Department: Payer: Medicaid Other

## 2020-12-04 ENCOUNTER — Encounter: Payer: Self-pay | Admitting: Emergency Medicine

## 2020-12-04 DIAGNOSIS — K640 First degree hemorrhoids: Secondary | ICD-10-CM | POA: Diagnosis present

## 2020-12-04 DIAGNOSIS — K921 Melena: Principal | ICD-10-CM | POA: Diagnosis present

## 2020-12-04 DIAGNOSIS — E538 Deficiency of other specified B group vitamins: Secondary | ICD-10-CM | POA: Diagnosis present

## 2020-12-04 DIAGNOSIS — K625 Hemorrhage of anus and rectum: Secondary | ICD-10-CM | POA: Diagnosis present

## 2020-12-04 DIAGNOSIS — Z801 Family history of malignant neoplasm of trachea, bronchus and lung: Secondary | ICD-10-CM

## 2020-12-04 DIAGNOSIS — I10 Essential (primary) hypertension: Secondary | ICD-10-CM | POA: Diagnosis present

## 2020-12-04 DIAGNOSIS — K922 Gastrointestinal hemorrhage, unspecified: Secondary | ICD-10-CM

## 2020-12-04 DIAGNOSIS — Z6837 Body mass index (BMI) 37.0-37.9, adult: Secondary | ICD-10-CM | POA: Diagnosis not present

## 2020-12-04 DIAGNOSIS — E876 Hypokalemia: Secondary | ICD-10-CM | POA: Diagnosis present

## 2020-12-04 DIAGNOSIS — Z8541 Personal history of malignant neoplasm of cervix uteri: Secondary | ICD-10-CM | POA: Diagnosis not present

## 2020-12-04 DIAGNOSIS — Z8249 Family history of ischemic heart disease and other diseases of the circulatory system: Secondary | ICD-10-CM | POA: Diagnosis not present

## 2020-12-04 DIAGNOSIS — D500 Iron deficiency anemia secondary to blood loss (chronic): Secondary | ICD-10-CM | POA: Diagnosis not present

## 2020-12-04 DIAGNOSIS — D62 Acute posthemorrhagic anemia: Secondary | ICD-10-CM | POA: Diagnosis present

## 2020-12-04 DIAGNOSIS — F17210 Nicotine dependence, cigarettes, uncomplicated: Secondary | ICD-10-CM | POA: Diagnosis present

## 2020-12-04 DIAGNOSIS — I82409 Acute embolism and thrombosis of unspecified deep veins of unspecified lower extremity: Secondary | ICD-10-CM

## 2020-12-04 DIAGNOSIS — R609 Edema, unspecified: Secondary | ICD-10-CM

## 2020-12-04 DIAGNOSIS — R109 Unspecified abdominal pain: Secondary | ICD-10-CM | POA: Diagnosis not present

## 2020-12-04 DIAGNOSIS — Z20822 Contact with and (suspected) exposure to covid-19: Secondary | ICD-10-CM | POA: Diagnosis present

## 2020-12-04 DIAGNOSIS — Z79899 Other long term (current) drug therapy: Secondary | ICD-10-CM

## 2020-12-04 DIAGNOSIS — Z7982 Long term (current) use of aspirin: Secondary | ICD-10-CM | POA: Diagnosis not present

## 2020-12-04 DIAGNOSIS — Z720 Tobacco use: Secondary | ICD-10-CM | POA: Diagnosis not present

## 2020-12-04 LAB — COMPREHENSIVE METABOLIC PANEL
ALT: 19 U/L (ref 0–44)
AST: 18 U/L (ref 15–41)
Albumin: 3.6 g/dL (ref 3.5–5.0)
Alkaline Phosphatase: 59 U/L (ref 38–126)
Anion gap: 7 (ref 5–15)
BUN: 19 mg/dL (ref 6–20)
CO2: 25 mmol/L (ref 22–32)
Calcium: 8.9 mg/dL (ref 8.9–10.3)
Chloride: 105 mmol/L (ref 98–111)
Creatinine, Ser: 0.94 mg/dL (ref 0.44–1.00)
GFR, Estimated: >60 mL/min (ref >60)
Glucose, Bld: 103 mg/dL — ABNORMAL HIGH (ref 70–99)
Potassium: 3.7 mmol/L (ref 3.5–5.1)
Sodium: 137 mmol/L (ref 135–145)
Total Bilirubin: 0.8 mg/dL (ref 0.3–1.2)
Total Protein: 7.3 g/dL (ref 6.5–8.1)

## 2020-12-04 LAB — RESP PANEL BY RT-PCR (FLU A&B, COVID) ARPGX2
Influenza A by PCR: NEGATIVE
Influenza B by PCR: NEGATIVE
SARS Coronavirus 2 by RT PCR: NEGATIVE

## 2020-12-04 LAB — CBC
HCT: 35.6 % — ABNORMAL LOW (ref 36.0–46.0)
Hemoglobin: 11.8 g/dL — ABNORMAL LOW (ref 12.0–15.0)
MCH: 30.2 pg (ref 26.0–34.0)
MCHC: 33.1 g/dL (ref 30.0–36.0)
MCV: 91 fL (ref 80.0–100.0)
Platelets: 402 10*3/uL — ABNORMAL HIGH (ref 150–400)
RBC: 3.91 MIL/uL (ref 3.87–5.11)
RDW: 13 % (ref 11.5–15.5)
WBC: 6.4 10*3/uL (ref 4.0–10.5)
nRBC: 0 % (ref 0.0–0.2)

## 2020-12-04 LAB — LIPASE, BLOOD: Lipase: 87 U/L — ABNORMAL HIGH (ref 11–51)

## 2020-12-04 LAB — PROTIME-INR
INR: 1 (ref 0.8–1.2)
Prothrombin Time: 13.5 seconds (ref 11.4–15.2)

## 2020-12-04 LAB — HEMOGLOBIN AND HEMATOCRIT, BLOOD
HCT: 30.8 % — ABNORMAL LOW (ref 36.0–46.0)
Hemoglobin: 10.3 g/dL — ABNORMAL LOW (ref 12.0–15.0)

## 2020-12-04 MED ORDER — PANTOPRAZOLE SODIUM 40 MG IV SOLR
40.0000 mg | Freq: Once | INTRAVENOUS | Status: AC
  Administered 2020-12-04: 40 mg via INTRAVENOUS
  Filled 2020-12-04: qty 40

## 2020-12-04 MED ORDER — ACETAMINOPHEN 650 MG RE SUPP
650.0000 mg | Freq: Four times a day (QID) | RECTAL | Status: DC | PRN
  Filled 2020-12-04: qty 1

## 2020-12-04 MED ORDER — SODIUM CHLORIDE 0.9 % IV BOLUS
1000.0000 mL | Freq: Once | INTRAVENOUS | Status: AC
  Administered 2020-12-04: 1000 mL via INTRAVENOUS

## 2020-12-04 MED ORDER — MORPHINE SULFATE (PF) 2 MG/ML IV SOLN
2.0000 mg | INTRAVENOUS | Status: DC | PRN
  Administered 2020-12-05 – 2020-12-10 (×6): 2 mg via INTRAVENOUS
  Filled 2020-12-04 (×6): qty 1

## 2020-12-04 MED ORDER — IOHEXOL 350 MG/ML SOLN
100.0000 mL | Freq: Once | INTRAVENOUS | Status: AC | PRN
  Administered 2020-12-04: 100 mL via INTRAVENOUS

## 2020-12-04 MED ORDER — ONDANSETRON HCL 4 MG/2ML IJ SOLN
4.0000 mg | Freq: Four times a day (QID) | INTRAMUSCULAR | Status: DC | PRN
  Administered 2020-12-05 (×2): 4 mg via INTRAVENOUS
  Filled 2020-12-04 (×3): qty 2

## 2020-12-04 MED ORDER — ALBUTEROL SULFATE (2.5 MG/3ML) 0.083% IN NEBU: 3.0000 mL | INHALATION_SOLUTION | Freq: Four times a day (QID) | RESPIRATORY_TRACT | Status: DC | PRN

## 2020-12-04 MED ORDER — ACETAMINOPHEN 325 MG PO TABS
650.0000 mg | ORAL_TABLET | Freq: Four times a day (QID) | ORAL | Status: DC | PRN
  Administered 2020-12-05 – 2020-12-12 (×8): 650 mg via ORAL
  Filled 2020-12-04 (×9): qty 2

## 2020-12-04 MED ORDER — ONDANSETRON HCL 4 MG PO TABS: 4.0000 mg | ORAL_TABLET | Freq: Four times a day (QID) | ORAL | Status: DC | PRN

## 2020-12-04 MED ORDER — SODIUM CHLORIDE 0.9 % IV SOLN: INTRAVENOUS | Status: AC

## 2020-12-04 MED ORDER — HYDRALAZINE HCL 20 MG/ML IJ SOLN: 5.0000 mg | INTRAMUSCULAR | Status: DC | PRN

## 2020-12-04 NOTE — H&P (Signed)
History and Physical    Cassidy Mathis WRU:[id number] DOB: February 11, 1975 DOA: 12/04/2020  PCP: [facility] [place]    Patient coming from:  Home   Chief Complaint:  Rectal bleeding   HPI: Cassidy Mathis is a 45 y.o. female seen in ed with complaints of cramping abdominal pain and rectal bleeding.pt went to have bowel movement and was nothing but blood- maroon red, pt did not pain during BM. Pt had abd pain in epigastric was at followed by bloody bowel movement.pt has alternating diarrhea and constipation but last week its has been runny - no blood, no fevers or chills or any other complaint.  Pt reports chest pain off / on for 1 year and went to cardiology last week and is due to have echo next week. Located midsternal /  >10/10, NR, burning pressure , pt lies flat and it eases up , she has echo next week.  Counseled pt on tobacco cessation and risk of cancer and ulcers.  Patient has never had scopes and has never had this presentation in the past. Pt has past medical history of hypertension, history of cervical cancer, common bile duct stone, pancreatitis, cholecystitis.  ED Course:  Vitals:   12/04/20 1849 12/04/20 1850  BP: 139/86   Pulse: (!) 112   Resp: 18   Temp: 99 F (37.2 C)   TempSrc: Oral   SpO2: 95%   Weight:  101.2 kg  Height:  5\' 5"  (1.651 m)  In the emergency room patient is alert awake oriented tachycardic oxygenating 95% on room air. Labs show normal CMP with a glucose of 103, CBC shows a normal white count of 6.4 with a hemoglobin of 11.8 and platelets of 402.  Intermittently patient has been anemic since March 2017 as far as we can tell based on our chart review.  However patient's previous hemoglobin was 13.1 in October 2022. In ed pt is Protonix, and ns bolus x 1.    Review of Systems:  Review of Systems  Constitutional:  Positive for diaphoresis.  HENT: Negative.    Eyes: Negative.   Respiratory: Negative.    Cardiovascular:   Positive for chest pain.  Gastrointestinal:  Positive for abdominal pain, blood in stool and nausea. Negative for vomiting.  Skin: Negative.   Neurological:  Positive for dizziness and weakness.    Past Medical History:  Diagnosis Date   Allergy    seasonal   Anemia    h/o   Asthma    Cancer (HCC)    cervical   Depression    GERD (gastroesophageal reflux disease)    RARE-NO MEDS   Headache    MIGRAINES   Hypertension    PT STATES SHE IS SUPPOSED TO BE TAKING LISINOPRIL-HCTZ BUT HAS BEEN OUT "FOR A WHILE" NEEDS TO GET ANOTHER PRESCRIPTION FROM HER PCP   Pancreatitis    Pilonidal cyst    TOOK ANTIBIOTIC THIS MONTH AND IT IS RESOLVED   Pneumonia 2018    Past Surgical History:  Procedure Laterality Date   ABDOMINAL HYSTERECTOMY     partial   CERVICAL CONIZATION W/BX N/A 03/23/2017   Procedure: CONIZATION CERVIX WITH BIOPSY;  Surgeon: Gillis Ends, MD;  Location: [facility] ORS;  Service: Gynecology;  Laterality: N/A;   CHOLECYSTECTOMY  10/21/2015   Procedure: LAPAROSCOPIC CHOLECYSTECTOMY WITH INTRAOPERATIVE CHOLANGIOGRAM;  Surgeon: Jules Husbands, MD;  Location: [facility] ORS;  Service: General;;   CHOLECYSTECTOMY Bilateral    CYST EXCISION  tongue AND WRIST   EPIGASTRIC HERNIA REPAIR N/A 04/18/2015   Procedure: HERNIA REPAIR EPIGASTRIC ADULT;  Surgeon: Jules Husbands, MD;  Location: [facility] ORS;  Service: General;  Laterality: N/A;   ERCP N/A 12/21/2016   Procedure: ENDOSCOPIC RETROGRADE CHOLANGIOPANCREATOGRAPHY (ERCP);  Surgeon: [name], MD;  Location: [facility] ENDOSCOPY;  Service: Endoscopy;  Laterality: N/A;   HERNIA REPAIR     LAPAROSCOPIC SALPINGO OOPHERECTOMY Right 01/01/2020   Procedure: LAPAROSCOPIC SALPINGO OOPHORECTOMY;  Surgeon: Schermerhorn, Gwen Her, MD;  Location: [facility] ORS;  Service: Gynecology;  Laterality: Right;   LYSIS OF ADHESION N/A 01/01/2020   Procedure: LYSIS OF ADHESION;  Surgeon: Schermerhorn, Gwen Her, MD;  Location: [facility] ORS;  Service: Gynecology;   Laterality: N/A;   RADICAL HYSTERECTOMY  04/14/2017   SALPINGECTOMY Bilateral 04/14/2017   SENTINEL LYMPH NODE BIOPSY     TUBAL LIGATION       reports that she has been smoking cigarettes. She has a 6.00 pack-year smoking history. She has never used smokeless tobacco. She reports current alcohol use. She reports current drug use. Frequency: 1.00 time per week. Drug: Marijuana.  No Known Allergies  Family History  Problem Relation Age of Onset   Hypertension Mother    Gallstones Mother    Hypertension Father    Gout Father    Cancer Paternal Uncle        lung   Cancer Maternal Grandmother        not sure what type of cancer   Cancer Maternal Grandfather        not sure what type of cancer   Cancer Paternal Grandmother        not sure what type of cancer   Cancer Paternal Grandfather        not sure what type of cancer    Prior to Admission medications   Medication Sig Start Date End Date Taking? Authorizing Provider  albuterol (PROVENTIL HFA;VENTOLIN HFA) 108 (90 Base) MCG/ACT inhaler Inhale 2 puffs into the lungs every 6 (six) hours as needed for wheezing or shortness of breath. 02/28/18   Merlyn Lot, MD  amLODipine (NORVASC) 5 MG tablet Take 1 tablet (5 mg total) by mouth daily. 11/28/20 01/27/21  End, Harrell Gave, MD  aspirin EC 81 MG tablet Take 1 tablet (81 mg total) by mouth daily. Swallow whole. 11/28/20   End, Harrell Gave, MD  gabapentin (NEURONTIN) 300 MG capsule Take 300 mg by mouth at bedtime. Patient not taking: Reported on 11/28/2020    [provider]  lisinopril (ZESTRIL) 40 MG tablet Take 1 tablet (40 mg total) by mouth every morning. 03/24/20   Sable Feil, PA-C  metoprolol succinate (TOPROL-XL) 50 MG 24 hr tablet Take 50 mg by mouth daily. 11/13/20   [provider]  metoprolol tartrate (LOPRESSOR) 50 MG tablet Take 1 tablet (50 mg total) by mouth once for 1 dose. Take TWO hours prior to CT procedure 11/28/20 11/28/20  End, Harrell Gave, MD   naproxen (NAPROSYN) 500 MG tablet Take 1 tablet (500 mg total) by mouth 2 (two) times daily with a meal. 03/24/20   Sable Feil, PA-C    Physical Exam: Vitals:   12/04/20 1849 12/04/20 1850  BP: 139/86   Pulse: (!) 112   Resp: 18   Temp: 99 F (37.2 C)   TempSrc: Oral   SpO2: 95%   Weight:  101.2 kg  Height:  5\' 5"  (1.651 m)   Physical Exam Vitals and nursing note reviewed.  Constitutional:  General: She is not in acute distress.    Appearance: Normal appearance. She is not ill-appearing, toxic-appearing or diaphoretic.  HENT:     Head: Normocephalic and atraumatic.     Right Ear: External ear normal.     Left Ear: External ear normal.     Nose: Nose normal.     Mouth/Throat:     Mouth: Mucous membranes are moist.  Eyes:     Extraocular Movements: Extraocular movements intact.     Pupils: Pupils are equal, round, and reactive to light.  Neck:     Vascular: No carotid bruit.  Cardiovascular:     Rate and Rhythm: Normal rate and regular rhythm.     Pulses: Normal pulses.     Heart sounds: Normal heart sounds.  Pulmonary:     Effort: Pulmonary effort is normal.     Breath sounds: Normal breath sounds.  Abdominal:     General: There is no distension.     Palpations: Abdomen is soft. There is no mass.     Tenderness: There is no abdominal tenderness. There is no guarding.     Hernia: No hernia is present.  Musculoskeletal:     Right lower leg: No edema.     Left lower leg: No edema.  Skin:    General: Skin is warm.  Neurological:     General: No focal deficit present.     Mental Status: She is alert and oriented to person, place, and time.  Psychiatric:        Mood and Affect: Mood normal.        Behavior: Behavior normal.   Labs on Admission: I have personally reviewed following labs and imaging studies  No results for input(s): CKTOTAL, CKMB, TROPONINI in the last 72 hours. Lab Results  Component Value Date   WBC 6.4 12/04/2020   HGB 11.8 (L)  12/04/2020   HCT 35.6 (L) 12/04/2020   MCV 91.0 12/04/2020   PLT 402 (H) 12/04/2020    Recent Labs  Lab 12/04/20 1853  NA 137  K 3.7  CL 105  CO2 25  BUN 19  CREATININE 0.94  CALCIUM 8.9  PROT 7.3  BILITOT 0.8  ALKPHOS 59  ALT 19  AST 18  GLUCOSE 103*   Lab Results  Component Value Date   CHOL 160 11/16/2017   HDL 75 11/16/2017   LDLCALC 69 11/16/2017   TRIG 75 11/16/2017   No results found for: DDIMER Invalid input(s): POCBNP  COVID-19 Labs No results for input(s): DDIMER, FERRITIN, LDH, CRP in the last 72 hours. Lab Results  Component Value Date   [place] NEGATIVE 12/28/2019  Radiological Exams on Admission: CT Angio Abd/Pel w/ and/or w/o  Result Date: 12/04/2020 CLINICAL DATA:  Patient to ED via POV for rectal bleeding. Patient had 1 episode PTA of bright red bloody stools. Patient also complaining of upper abd pain and lightheaded. EXAM: CTA ABDOMEN AND PELVIS WITHOUT AND WITH CONTRAST TECHNIQUE: Multidetector CT imaging of the abdomen and pelvis was performed using the standard protocol during bolus administration of intravenous contrast. Multiplanar reconstructed images and MIPs were obtained and reviewed to evaluate the vascular anatomy. CONTRAST:  164mL OMNIPAQUE IOHEXOL 350 MG/ML SOLN COMPARISON:  None. FINDINGS: VASCULAR Aorta: Mild atherosclerotic plaque. Normal caliber aorta without aneurysm, dissection, vasculitis or significant stenosis. Celiac: Patent without evidence of aneurysm, dissection, vasculitis or significant stenosis. SMA: Patent without evidence of aneurysm, dissection, vasculitis or significant stenosis. Renals: Both renal arteries are patent without evidence  of aneurysm, dissection, vasculitis, fibromuscular dysplasia or significant stenosis. IMA: Patent without evidence of aneurysm, dissection, vasculitis or significant stenosis. Inflow: Patent without evidence of aneurysm, dissection, vasculitis or significant stenosis. Proximal Outflow:  Bilateral common femoral and visualized portions of the superficial and profunda femoral arteries are patent without evidence of aneurysm, dissection, vasculitis or significant stenosis. Veins: The hepatic, main portal, splenic, superior mesenteric veins are patent. Review of the MIP images confirms the above findings. NON-VASCULAR Lower chest: No acute abnormality. Hepatobiliary: No focal liver abnormality. Status post cholecystectomy. No biliary dilatation. Pancreas: No focal lesion. Normal pancreatic contour. No surrounding inflammatory changes. No main pancreatic ductal dilatation. Spleen: Normal in size without focal abnormality. Adrenals/Urinary Tract: No adrenal nodule bilaterally. Bilateral kidneys enhance symmetrically. No hydronephrosis. No hydroureter. The urinary bladder is unremarkable. Stomach/Bowel: Stomach is within normal limits. No evidence of bowel wall thickening or dilatation. Appendix appears normal. Lymphatic: No lymphadenopathy. Reproductive: Status post hysterectomy. No adnexal masses. Other: No intraperitoneal free fluid. No intraperitoneal free gas. No organized fluid collection. Musculoskeletal: No abdominal wall hernia or abnormality. No suspicious lytic or blastic osseous lesions. No acute displaced fracture. IMPRESSION: VASCULAR 1. No CT evidence of an active bleed. 2. No acute vascular abnormality. 3.  Aortic Atherosclerosis (ICD10-I70.0). NON-VASCULAR 1. No acute intra-abdominal or intrapelvic abnormality. Electronically Signed   By: Iven Finn M.D.   On: 12/04/2020 21:17    EKG: Independently reviewed.  None   Assessment/Plan Principal Problem:   Rectal bleeding Active Problems:   Uncontrolled hypertension   Tobacco use  Rectal Bleeding: We will admit pt to progressive unit gfor gi bleed. Repeat h/h in 6 hours of admission.  Type and screen / iv ppi. Suspect pt may PUD as she has c/o chest pain as well.  CTA in ed was negative.  Gi consult as deemed appropriate  with am hemoglobin.   HTN:  Resume home meds once med rec is done.   Tobacco abuse: Nicotine patch.  Counseling offered and advised to stop smoking.     DVT prophylaxis:  Scd's    Code Status:  Full code    Family Communication:  Dion Saucier (Mother)  [phone] (Mobile)   Disposition Plan:  Home    Consults called:  None   Admission status: Inpatient.     Para Skeans MD [facility] [phone] How to contact the [facility] Attending or Consulting provider [place] or covering provider during after hours [place], for this patient.    Check the care team in [facility] and look for a) attending/consulting [facility] provider listed and b) the [facility] team listed Log into [email or web address] and use [place]'s universal password to access. If you do not have the password, please contact the hospital operator. Locate the [facility] provider you are looking for under [facility] and page to a number that you can be directly reached. If you still have difficulty reaching the provider, please page the [facility] (Director on Call) for the Hospitalists listed on amion for assistance. [email or web address] Password [facility] 12/04/2020, 10:36 PM

## 2020-12-04 NOTE — ED Notes (Signed)
Pt ambulated to bathroom by herself with no dizziness.

## 2020-12-04 NOTE — ED Notes (Signed)
This RN to bedside to help pt. To toilet. This RN offered stand-by assist while pt. Ambulated to toilet, gait steady. While on toilet, and passing large amount of bloody stool, pt. Became diaphoretic and states that she thought she was going to pass out. This RN called for additional help to escort pt. Back to bed. Cool wash clothes placed on pt's nek and forehead and pt. Reports feeling better one sitting in bed. Pt. VS stable, verbalized no further complaint, dizziness, or pain at this time. MD notified, will continue to monitor.

## 2020-12-04 NOTE — ED Provider Notes (Signed)
[facility] Emergency Department Provider Note  ____________________________________________  Time seen: Approximately 8:02 PM  I have reviewed the triage vital signs and the nursing notes.   HISTORY  Chief Complaint Rectal Bleeding    HPI Cassidy Mathis is a 45 y.o. female with a history of GERD, migraines, hypertension who is ED complaining of rectal bleeding that started earlier today.  Associated with some occasional upper abdominal pain.  No nausea or vomiting, no fever.  Denies melanotic stools.  She has had multiple episodes of bright red blood per rectum output at home and now in the ED waiting room.  With that she has become diaphoretic and lightheaded, but feels better while lying down in the treatment bed.  Denies chest pain or shortness of breath.  Does not take blood thinners.  No recent steroid use.  She does take naproxen as needed for sciatica.    Past Medical History:  Diagnosis Date   Allergy    seasonal   Anemia    h/o   Asthma    Cancer ([place])    cervical   Depression    GERD (gastroesophageal reflux disease)    RARE-NO MEDS   Headache    MIGRAINES   Hypertension    PT STATES SHE IS SUPPOSED TO BE TAKING LISINOPRIL-HCTZ BUT HAS BEEN OUT "FOR A WHILE" NEEDS TO GET ANOTHER PRESCRIPTION FROM HER PCP   Pancreatitis    Pilonidal cyst    TOOK ANTIBIOTIC THIS MONTH AND IT IS RESOLVED   Pneumonia 2018     Patient Active Problem List   Diagnosis Date Noted   Precordial pain 11/28/2020   Palpitations 11/28/2020   Heart murmur 11/28/2020   Uncontrolled hypertension 11/28/2020   Right ovarian cyst    Cervical cancer, FIGO stage IB1 (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[place])    Common bile duct stone    Pancreatitis 12/16/2016   Calculus of gallbladder with chronic cholecystitis without obstruction     Incarcerated epigastric hernia      Past Surgical History:  Procedure Laterality Date   ABDOMINAL HYSTERECTOMY     partial   CERVICAL CONIZATION W/BX N/A 03/23/2017   Procedure: CONIZATION CERVIX WITH BIOPSY;  Surgeon: Gillis Ends, MD;  Location: [facility] ORS;  Service: Gynecology;  Laterality: N/A;   CHOLECYSTECTOMY  10/21/2015   Procedure: LAPAROSCOPIC CHOLECYSTECTOMY WITH INTRAOPERATIVE CHOLANGIOGRAM;  Surgeon: Jules Husbands, MD;  Location: [facility] ORS;  Service: General;;   CHOLECYSTECTOMY Bilateral    CYST EXCISION     tongue AND WRIST   EPIGASTRIC HERNIA REPAIR N/A 04/18/2015   Procedure: HERNIA REPAIR EPIGASTRIC ADULT;  Surgeon: Jules Husbands, MD;  Location: [facility] ORS;  Service: General;  Laterality: N/A;   ERCP N/A 12/21/2016   Procedure: ENDOSCOPIC RETROGRADE CHOLANGIOPANCREATOGRAPHY (ERCP);  Surgeon: [name], MD;  Location: [facility] ENDOSCOPY;  Service: Endoscopy;  Laterality: N/A;   HERNIA REPAIR     LAPAROSCOPIC SALPINGO OOPHERECTOMY Right 01/01/2020   Procedure: LAPAROSCOPIC SALPINGO OOPHORECTOMY;  Surgeon: Schermerhorn, Gwen Her, MD;  Location: [facility] ORS;  Service: Gynecology;  Laterality: Right;   LYSIS OF ADHESION N/A 01/01/2020   Procedure: LYSIS OF ADHESION;  Surgeon: Schermerhorn, Gwen Her, MD;  Location: [facility] ORS;  Service: Gynecology;  Laterality: N/A;   RADICAL HYSTERECTOMY  04/14/2017   SALPINGECTOMY Bilateral 04/14/2017   SENTINEL LYMPH NODE BIOPSY  TUBAL LIGATION       Prior to Admission medications   Medication Sig Start Date End Date Taking? Authorizing Provider  albuterol (PROVENTIL HFA;VENTOLIN HFA) 108 (90 Base) MCG/ACT inhaler Inhale 2 puffs into the lungs every 6 (six) hours as needed for wheezing or shortness of breath. 02/28/18   Merlyn Lot, MD  amLODipine (NORVASC) 5 MG tablet Take 1 tablet (5 mg total) by mouth daily. 11/28/20 01/27/21  End, Harrell Gave, MD  aspirin EC 81 MG tablet Take 1 tablet (81 mg total) by mouth daily. Swallow whole.  11/28/20   End, Harrell Gave, MD  gabapentin (NEURONTIN) 300 MG capsule Take 300 mg by mouth at bedtime. Patient not taking: Reported on 11/28/2020    [provider]  lisinopril (ZESTRIL) 40 MG tablet Take 1 tablet (40 mg total) by mouth every morning. 03/24/20   Sable Feil, PA-C  metoprolol succinate (TOPROL-XL) 50 MG 24 hr tablet Take 50 mg by mouth daily. 11/13/20   [provider]  metoprolol tartrate (LOPRESSOR) 50 MG tablet Take 1 tablet (50 mg total) by mouth once for 1 dose. Take TWO hours prior to CT procedure 11/28/20 11/28/20  End, Harrell Gave, MD  naproxen (NAPROSYN) 500 MG tablet Take 1 tablet (500 mg total) by mouth 2 (two) times daily with a meal. 03/24/20   Sable Feil, PA-C     Allergies Patient has no known allergies.   Family History  Problem Relation Age of Onset   Hypertension Mother    Gallstones Mother    Hypertension Father    Gout Father    Cancer Paternal Uncle        lung   Cancer Maternal Grandmother        not sure what type of cancer   Cancer Maternal Grandfather        not sure what type of cancer   Cancer Paternal Grandmother        not sure what type of cancer   Cancer Paternal Grandfather        not sure what type of cancer    Social History Social History   Tobacco Use   Smoking status: Some Days    Packs/day: 0.50    Years: 12.00    Pack years: 6.00    Types: Cigarettes   Smokeless tobacco: Never   Tobacco comments:    11/28/2020 last smoked cigarettes 10 days ago  Vaping Use   Vaping Use: Never used  Substance Use Topics   Alcohol use: Yes    Comment: occassionally; less than 1/month   Drug use: Yes    Frequency: 1.0 times per week    Types: Marijuana    Review of Systems  Constitutional:   No fever or chills.  ENT:   No sore throat. No rhinorrhea. Cardiovascular:   No chest pain or syncope. Respiratory:   No dyspnea or cough. Gastrointestinal:   Positive upper abdominal pain and rectal  bleeding. Musculoskeletal:   Negative for focal pain or swelling All other systems reviewed and are negative except as documented above in ROS and HPI.  ____________________________________________   PHYSICAL EXAM:  VITAL SIGNS: ED Triage Vitals  Enc Vitals Group     BP 12/04/20 1849 139/86     Pulse Rate 12/04/20 1849 (!) 112     Resp 12/04/20 1849 18     Temp 12/04/20 1849 99 F (37.2 C)     Temp Source 12/04/20 1849 Oral     SpO2 12/04/20 1849 95 %  Weight 12/04/20 1850 223 lb (101.2 kg)     Height 12/04/20 1850 5\' 5"  (1.651 m)     Head Circumference --      Peak Flow --      Pain Score 12/04/20 1849 0     Pain Loc --      Pain Edu? --      Excl. in [place]? --     Vital signs reviewed, nursing assessments reviewed.   Constitutional:   Alert and oriented. Non-toxic appearance. Eyes:   Conjunctivae are normal. EOMI. PERRL. ENT      Head:   Normocephalic and atraumatic.      Nose:   Normal.  No epistaxis      Mouth/Throat:   Moist mucosa.  No bleeding gums      Neck:   No meningismus. Full ROM. Hematological/Lymphatic/Immunilogical:   No cervical lymphadenopathy. Cardiovascular:   Tachycardia heart rate 110. Symmetric bilateral radial and DP pulses.  No murmurs. Cap refill less than 2 seconds. Respiratory:   Normal respiratory effort without tachypnea/retractions. Breath sounds are clear and equal bilaterally. No wheezes/rales/rhonchi. Gastrointestinal:   Soft and nontender. Non distended. There is no CVA tenderness.  No rebound, rigidity, or guarding.  No external hemorrhoids.  Passed a grossly bloody bright red blood bowel movement in the ED treatment room. Genitourinary:   deferred Musculoskeletal:   Normal range of motion in all extremities. No joint effusions.  No lower extremity tenderness.  No edema. Neurologic:   Normal speech and language.  Motor grossly intact. No acute focal neurologic deficits are appreciated.  Skin:    Skin is warm, dry and intact. No rash  noted.  No petechiae, purpura, or bullae.  ____________________________________________    LABS (pertinent positives/negatives) (all labs ordered are listed, but only abnormal results are displayed) Labs Reviewed  COMPREHENSIVE METABOLIC PANEL - Abnormal; Notable for the following components:      Result Value   Glucose, Bld 103 (*)    All other components within normal limits  CBC - Abnormal; Notable for the following components:   Hemoglobin 11.8 (*)    HCT 35.6 (*)    Platelets 402 (*)    All other components within normal limits  RESP PANEL BY RT-PCR (FLU A&B, COVID) ARPGX2  PROTIME-INR  POC OCCULT BLOOD, ED  POC URINE PREG, ED  TYPE AND SCREEN   ____________________________________________   EKG    ____________________________________________    RADIOLOGY  No results found.  ____________________________________________   PROCEDURES Procedures  ____________________________________________    CLINICAL IMPRESSION / ASSESSMENT AND PLAN / ED COURSE  Medications ordered in the ED: Medications  sodium chloride 0.9 % bolus 1,000 mL (1,000 mLs Intravenous New Bag/Given 12/04/20 1955)  pantoprazole (PROTONIX) injection 40 mg (40 mg Intravenous Given 12/04/20 1954)    Pertinent labs & imaging results that were available during my care of the patient were reviewed by me and considered in my medical decision making (see chart for details).  Cassidy Mathis was evaluated in Emergency Department on 12/04/2020 for the symptoms described in the history of present illness. She was evaluated in the context of the global COVID-19 pandemic, which necessitated consideration that the patient might be at risk for infection with the SARS-CoV-2 virus that causes COVID-19. Institutional protocols and algorithms that pertain to the evaluation of patients at risk for COVID-19 are in a state of rapid change based on information released by regulatory bodies including the CDC and  federal and state organizations. These  policies and algorithms were followed during the patient's care in the ED.   Patient presents with lower GI bleed.  Hemoglobin is 11.8, compared to baseline of 13.2 from 3 weeks ago in the EMR.  Will give IV fluids for now for lightheadedness and tachycardia.  Blood pressure is stable.  We will need to hospitalize for monitoring of hemodynamics, symptoms, and hemoglobin level.        ____________________________________________   FINAL CLINICAL IMPRESSION(S) / ED DIAGNOSES    Final diagnoses:  Acute lower GI bleeding     ED Discharge Orders     None       Portions of this note were generated with dragon dictation software. Dictation errors may occur despite best attempts at proofreading.    Carrie Mew, MD 12/04/20 2006

## 2020-12-04 NOTE — ED Triage Notes (Signed)
Patient to ED via POV for rectal bleeding. Patient had 1 episode PTA of bright red bloody stools. Patient also complaining of upper abd pain and lightheaded. NAD noted at this time.

## 2020-12-04 NOTE — ED Notes (Signed)
ED Provider at bedside. 

## 2020-12-05 ENCOUNTER — Encounter: Payer: Self-pay | Admitting: Internal Medicine

## 2020-12-05 DIAGNOSIS — K922 Gastrointestinal hemorrhage, unspecified: Secondary | ICD-10-CM

## 2020-12-05 DIAGNOSIS — K625 Hemorrhage of anus and rectum: Secondary | ICD-10-CM | POA: Diagnosis not present

## 2020-12-05 LAB — HEMOGLOBIN AND HEMATOCRIT, BLOOD
HCT: 24.7 % — ABNORMAL LOW (ref 36.0–46.0)
Hemoglobin: 8 g/dL — ABNORMAL LOW (ref 12.0–15.0)

## 2020-12-05 LAB — CBC
HCT: 27 % — ABNORMAL LOW (ref 36.0–46.0)
HCT: 26.8 % — ABNORMAL LOW (ref 36.0–46.0)
Hemoglobin: 8.9 g/dL — ABNORMAL LOW (ref 12.0–15.0)
Hemoglobin: 8.9 g/dL — ABNORMAL LOW (ref 12.0–15.0)
MCH: 29.6 pg (ref 26.0–34.0)
MCH: 30 pg (ref 26.0–34.0)
MCHC: 33 g/dL (ref 30.0–36.0)
MCHC: 33.2 g/dL (ref 30.0–36.0)
MCV: 89.7 fL (ref 80.0–100.0)
MCV: 90.2 fL (ref 80.0–100.0)
Platelets: 307 10*3/uL (ref 150–400)
Platelets: 346 10*3/uL (ref 150–400)
RBC: 3.01 MIL/uL — ABNORMAL LOW (ref 3.87–5.11)
RBC: 2.97 MIL/uL — ABNORMAL LOW (ref 3.87–5.11)
RDW: 13 % (ref 11.5–15.5)
RDW: 13 % (ref 11.5–15.5)
WBC: 7.6 10*3/uL (ref 4.0–10.5)
WBC: 6.4 10*3/uL (ref 4.0–10.5)
nRBC: 0 % (ref 0.0–0.2)
nRBC: 0 % (ref 0.0–0.2)

## 2020-12-05 LAB — GLUCOSE, CAPILLARY
Glucose-Capillary: 130 mg/dL — ABNORMAL HIGH (ref 70–99)
Glucose-Capillary: 103 mg/dL — ABNORMAL HIGH (ref 70–99)

## 2020-12-05 LAB — VITAMIN B12: Vitamin B-12: 150 pg/mL — ABNORMAL LOW (ref 180–914)

## 2020-12-05 LAB — BASIC METABOLIC PANEL
Anion gap: 5 (ref 5–15)
BUN: 16 mg/dL (ref 6–20)
CO2: 25 mmol/L (ref 22–32)
Calcium: 8 mg/dL — ABNORMAL LOW (ref 8.9–10.3)
Chloride: 109 mmol/L (ref 98–111)
Creatinine, Ser: 0.91 mg/dL (ref 0.44–1.00)
GFR, Estimated: >60 mL/min (ref >60)
Glucose, Bld: 98 mg/dL (ref 70–99)
Potassium: 3.7 mmol/L (ref 3.5–5.1)
Sodium: 139 mmol/L (ref 135–145)

## 2020-12-05 LAB — IRON AND TIBC
Iron: 147 ug/dL (ref 28–170)
Saturation Ratios: 42 % — ABNORMAL HIGH (ref 10.4–31.8)
TIBC: 351 ug/dL (ref 250–450)
UIBC: 204 ug/dL

## 2020-12-05 LAB — FERRITIN: Ferritin: 76 ng/mL (ref 11–307)

## 2020-12-05 LAB — HIV ANTIBODY (ROUTINE TESTING W REFLEX): HIV Screen 4th Generation wRfx: NONREACTIVE

## 2020-12-05 LAB — ABO/RH: ABO/RH(D): A POS

## 2020-12-05 LAB — FOLATE: Folate: >24.8 ng/mL (ref >5.9)

## 2020-12-05 MED ORDER — PANTOPRAZOLE 80MG IVPB - SIMPLE MED
80.0000 mg | Freq: Once | INTRAVENOUS | Status: AC
  Administered 2020-12-05: 80 mg via INTRAVENOUS
  Filled 2020-12-05: qty 80
  Filled 2020-12-05: qty 100

## 2020-12-05 MED ORDER — CHLORHEXIDINE GLUCONATE CLOTH 2 % EX PADS
6.0000 | MEDICATED_PAD | Freq: Every day | CUTANEOUS | Status: DC
  Administered 2020-12-05: 6 via TOPICAL

## 2020-12-05 MED ORDER — POLYETHYLENE GLYCOL 3350 17 GM/SCOOP PO POWD
1.0000 | Freq: Once | ORAL | Status: AC
  Administered 2020-12-05: 255 g via ORAL
  Filled 2020-12-05: qty 255

## 2020-12-05 MED ORDER — PANTOPRAZOLE SODIUM 40 MG IV SOLR: 40.0000 mg | Freq: Two times a day (BID) | INTRAVENOUS | Status: DC

## 2020-12-05 MED ORDER — ALBUTEROL SULFATE (2.5 MG/3ML) 0.083% IN NEBU: 2.5000 mg | INHALATION_SOLUTION | RESPIRATORY_TRACT | Status: DC | PRN

## 2020-12-05 MED ORDER — SODIUM CHLORIDE 0.9% IV SOLUTION: Freq: Once | INTRAVENOUS | Status: AC

## 2020-12-05 NOTE — ED Notes (Signed)
Spouse calls out from lobby for help; pt & spouse found standing outside lobby BR; pt is pale & diaphoretic; assisted into w/c; pt denies pain but reports rectal bleeding; taken immed to room 26; assisted into hosp gown & on card monitor; pt reports has had CP all day today, currently seeing a cardiologist and has echo scheduled; EKG performed and handed to MD for review; pt assisted to clens self of small amount bright blood with small clots noted to buttocks; Dr Joni Fears and care nurse Elana notified of pt's arrival and CC; pt informed to remain NPO until evaluated further

## 2020-12-05 NOTE — Progress Notes (Signed)
Assisted patient to bathroom at [place] and waited outside bathroom door to help patient back to bed. She started moaning while she was in bathroom so opened the door to check on her and patient was very diaphoretic and stated she felt like she was going to pass out. She was having large amounts of bright red blood with clots coming out of rectum. Charge nurse notified and rapid response initiated. She became tachycardic in the 140's while in the bathroom, but bp remained stable 122/80. Got her back to bed via wheelchair and go her cleaned up. She is feeling some better since laying back in the bed and getting cooled off. She is being transferred to step down unit but does not have a bed at this time. Nightshift nurse Ammi given report.

## 2020-12-05 NOTE — Consult Note (Signed)
Cephas Darby, MD [address]  [place]  [place], [place] [address]  Main: [phone]  Fax: [phone] Pager: [phone]   Consultation  Referring Provider:     No ref. provider found Primary Care Physician:  [facility] Primary Gastroenterologist: Althia Forts    Reason for Consultation:     Rectal bleeding, acute blood loss anemia  Date of Admission:  12/04/2020 Date of Consultation:  12/05/2020         HPI:   Cassidy Mathis is a 45 y.o. female with history of hypertension, NSAID use presented to ER last night after she had 4 episodes of large-volume rectal bleeding, bright red blood per rectum.  Patient denies abdominal pain, nausea or vomiting.  She denies any rectal discomfort.  She reports that her bowel movements have been regular.  She denies constipation.  She felt lightheaded when she came to the ER.  Labs on admission revealed hemoglobin 11.8, dropped from 13.1 about a month ago.  She received IV fluids due to symptomatic anemia/presyncope, her hemoglobin dropped to 8.9 this morning.  No evidence of chronic liver disease.  She underwent CT angio abdomen and pelvis which did not reveal active GI bleed.  Patient reports feeling dizzy.  Her blood pressure has i responded to IV fluids.  She is currently on maintenance IV fluids and kept n.p.o. patient denies similar episodes in the past  She does smoke, occasional alcohol use   NSAIDs: Regularly takes ibuprofen about 2-3 times weekly for headache  Antiplts/Anticoagulants/Anti thrombotics: None  GI Procedures: None She denies family history of GI malignancy  Past Medical History:  Diagnosis Date   Allergy    seasonal   Anemia    h/o   Asthma    Cancer ([place])    cervical   Depression    GERD (gastroesophageal reflux disease)    RARE-NO MEDS   Headache    MIGRAINES   Hypertension    PT STATES SHE IS SUPPOSED TO BE TAKING LISINOPRIL-HCTZ BUT HAS BEEN OUT "FOR A WHILE"  NEEDS TO GET ANOTHER PRESCRIPTION FROM HER PCP   Pancreatitis    Pilonidal cyst    TOOK ANTIBIOTIC THIS MONTH AND IT IS RESOLVED   Pneumonia 2018    Past Surgical History:  Procedure Laterality Date   ABDOMINAL HYSTERECTOMY     partial   CERVICAL CONIZATION W/BX N/A 03/23/2017   Procedure: CONIZATION CERVIX WITH BIOPSY;  Surgeon: Gillis Ends, MD;  Location: [facility];  Service: Gynecology;  Laterality: N/A;   CHOLECYSTECTOMY  10/21/2015   Procedure: LAPAROSCOPIC CHOLECYSTECTOMY WITH INTRAOPERATIVE CHOLANGIOGRAM;  Surgeon: Jules Husbands, MD;  Location: [facility];  Service: General;;   CHOLECYSTECTOMY Bilateral    CYST EXCISION     tongue AND WRIST   EPIGASTRIC HERNIA REPAIR N/A 04/18/2015   Procedure: HERNIA REPAIR EPIGASTRIC ADULT;  Surgeon: Jules Husbands, MD;  Location: [facility];  Service: General;  Laterality: N/A;   ERCP N/A 12/21/2016   Procedure: ENDOSCOPIC RETROGRADE CHOLANGIOPANCREATOGRAPHY (ERCP);  Surgeon: [name], MD;  Location: [facility];  Service: Endoscopy;  Laterality: N/A;   HERNIA REPAIR     LAPAROSCOPIC SALPINGO OOPHERECTOMY Right 01/01/2020   Procedure: LAPAROSCOPIC SALPINGO OOPHORECTOMY;  Surgeon: Schermerhorn, Gwen Her, MD;  Location: [facility];  Service: Gynecology;  Laterality: Right;   LYSIS OF ADHESION N/A 01/01/2020   Procedure: LYSIS OF ADHESION;  Surgeon: Schermerhorn, Gwen Her, MD;  Location: [facility];  Service: Gynecology;  Laterality:  N/A;   RADICAL HYSTERECTOMY  04/14/2017   SALPINGECTOMY Bilateral 04/14/2017   SENTINEL LYMPH NODE BIOPSY     TUBAL LIGATION      Prior to Admission medications   Medication Sig Start Date End Date Taking? Authorizing Provider  albuterol (PROVENTIL HFA;VENTOLIN HFA) 108 (90 Base) MCG/ACT inhaler Inhale 2 puffs into the lungs every 6 (six) hours as needed for wheezing or shortness of breath. 02/28/18  Yes Willy Eddy, MD  amLODipine (NORVASC) 5 MG tablet Take 1 tablet (5 mg total) by mouth daily. 11/28/20  01/27/21 Yes End, Cristal Deer, MD  aspirin EC 81 MG tablet Take 1 tablet (81 mg total) by mouth daily. Swallow whole. 11/28/20  Yes End, Cristal Deer, MD  gabapentin (NEURONTIN) 300 MG capsule Take 300 mg by mouth at bedtime.   Yes [provider]  lisinopril (ZESTRIL) 40 MG tablet Take 1 tablet (40 mg total) by mouth every morning. 03/24/20  Yes Joni Reining, PA-C  metoprolol succinate (TOPROL-XL) 25 MG 24 hr tablet Take 25 mg by mouth daily. 12/01/20  Yes [provider]  naproxen (NAPROSYN) 500 MG tablet Take 1 tablet (500 mg total) by mouth 2 (two) times daily with a meal. 03/24/20  Yes Joni Reining, PA-C  metoprolol succinate (TOPROL-XL) 50 MG 24 hr tablet Take 50 mg by mouth daily. Patient not taking: Reported on 12/04/2020 11/13/20   [provider]  metoprolol tartrate (LOPRESSOR) 50 MG tablet Take 1 tablet (50 mg total) by mouth once for 1 dose. Take TWO hours prior to CT procedure 11/28/20 11/28/20  End, Cristal Deer, MD   Current Facility-Administered Medications:    0.9 %  sodium chloride infusion, , Intravenous, Continuous, Gertha Calkin, MD, Last Rate: 75 mL/hr at 12/05/20 1045, New Bag at 12/05/20 1045   acetaminophen (TYLENOL) tablet 650 mg, 650 mg, Oral, Q6H PRN, 650 mg at 12/05/20 1453 **OR** acetaminophen (TYLENOL) suppository 650 mg, 650 mg, Rectal, Q6H PRN, Gertha Calkin, MD   albuterol (PROVENTIL) (2.5 MG/3ML) 0.083% nebulizer solution 3 mL, 3 mL, Inhalation, Q6H PRN, Gertha Calkin, MD   hydrALAZINE (APRESOLINE) injection 5 mg, 5 mg, Intravenous, Q4H PRN, Irena Cords V, MD   morphine 2 MG/ML injection 2 mg, 2 mg, Intravenous, Q2H PRN, Irena Cords V, MD, 2 mg at 12/05/20 0245   ondansetron (ZOFRAN) tablet 4 mg, 4 mg, Oral, Q6H PRN **OR** ondansetron (ZOFRAN) injection 4 mg, 4 mg, Intravenous, Q6H PRN, Gertha Calkin, MD, 4 mg at 12/05/20 0245   polyethylene glycol powder (GLYCOLAX/MIRALAX) container 255 g, 1 Container, Oral, Once, Sai Zinn, Loel Dubonnet,  MD   Family History  Problem Relation Age of Onset   Hypertension Mother    Gallstones Mother    Hypertension Father    Gout Father    Cancer Paternal Uncle        lung   Cancer Maternal Grandmother        not sure what type of cancer   Cancer Maternal Grandfather        not sure what type of cancer   Cancer Paternal Grandmother        not sure what type of cancer   Cancer Paternal Grandfather        not sure what type of cancer     Social History   Tobacco Use   Smoking status: Some Days    Packs/day: 0.50    Years: 12.00    Pack years: 6.00    Types: Cigarettes  Smokeless tobacco: Never   Tobacco comments:    11/28/2020 last smoked cigarettes 10 days ago  Vaping Use   Vaping Use: Never used  Substance Use Topics   Alcohol use: Yes    Comment: occassionally; less than 1/month   Drug use: Yes    Frequency: 1.0 times per week    Types: Marijuana    Allergies as of 12/04/2020   (No Known Allergies)    Review of Systems:    All systems reviewed and negative except where noted in HPI.   Physical Exam:  Vital signs in last 24 hours: Temp:  [98 F (36.7 C)-99.2 F (37.3 C)] 98 F (36.7 C) (11/11 1021) Pulse Rate:  [71-112] 76 (11/11 1021) Resp:  [12-18] 17 (11/11 1021) BP: (102-142)/(55-89) 131/82 (11/11 1021) SpO2:  [95 %-100 %] 100 % (11/11 1021) Weight:  [101.2 kg] 101.2 kg (11/10 1850) Last BM Date: 12/05/20 General:   Pleasant, cooperative in NAD Head:  Normocephalic and atraumatic. Eyes:   No icterus.   Conjunctiva pink. PERRLA. Ears:  Normal auditory acuity. Neck:  Supple; no masses or thyroidomegaly Lungs: Respirations even and unlabored. Lungs clear to auscultation bilaterally.   No wheezes, crackles, or rhonchi.  Heart:  Regular rate and rhythm;  Without murmur, clicks, rubs or gallops Abdomen:  Soft, nondistended, nontender. Normal bowel sounds. No appreciable masses or hepatomegaly.  No rebound or guarding.  Rectal:  Not performed. Msk:   Symmetrical without gross deformities.  Strength normal Extremities:  Without edema, cyanosis or clubbing. Neurologic:  Alert and oriented x3;  grossly normal neurologically. Skin:  Intact without significant lesions or rashes. Psych:  Alert and cooperative. Normal affect.  LAB RESULTS: CBC Latest Ref Rng & Units 12/05/2020 12/04/2020 12/04/2020  WBC 4.0 - 10.5 K/uL 7.6 - 6.4  Hemoglobin 12.0 - 15.0 g/dL 8.9(L) 10.3(L) 11.8(L)  Hematocrit 36.0 - 46.0 % 27.0(L) 30.8(L) 35.6(L)  Platelets 150 - 400 K/uL 307 - 402(H)    BMET BMP Latest Ref Rng & Units 12/05/2020 12/04/2020 11/28/2020  Glucose 70 - 99 mg/dL 98 103(H) 88  BUN 6 - 20 mg/dL $Remove'16 19 8  'sVIouqz$ Creatinine 0.44 - 1.00 mg/dL 0.91 0.94 0.96  BUN/Creat Ratio 9 - 23 - - 8(L)  Sodium 135 - 145 mmol/L 139 137 140  Potassium 3.5 - 5.1 mmol/L 3.7 3.7 4.5  Chloride 98 - 111 mmol/L 109 105 104  CO2 22 - 32 mmol/L $RemoveB'25 25 26  'MWEWKqpN$ Calcium 8.9 - 10.3 mg/dL 8.0(L) 8.9 9.6    LFT Hepatic Function Latest Ref Rng & Units 12/04/2020 11/08/2020 12/02/2019  Total Protein 6.5 - 8.1 g/dL 7.3 7.9 7.8  Albumin 3.5 - 5.0 g/dL 3.6 4.2 4.2  AST 15 - 41 U/L $Remo'18 21 17  'wLuFM$ ALT 0 - 44 U/L $Remo'19 18 16  'pVLSA$ Alk Phosphatase 38 - 126 U/L 59 64 62  Total Bilirubin 0.3 - 1.2 mg/dL 0.8 0.4 0.6  Bilirubin, Direct 0.00 - 0.40 mg/dL - - -     STUDIES: CT Angio Abd/Pel w/ and/or w/o  Result Date: 12/04/2020 CLINICAL DATA:  Patient to ED via POV for rectal bleeding. Patient had 1 episode PTA of bright red bloody stools. Patient also complaining of upper abd pain and lightheaded. EXAM: CTA ABDOMEN AND PELVIS WITHOUT AND WITH CONTRAST TECHNIQUE: Multidetector CT imaging of the abdomen and pelvis was performed using the standard protocol during bolus administration of intravenous contrast. Multiplanar reconstructed images and MIPs were obtained and reviewed to evaluate the vascular anatomy. CONTRAST:  131mL OMNIPAQUE IOHEXOL 350 MG/ML SOLN COMPARISON:  None. FINDINGS: VASCULAR Aorta: Mild  atherosclerotic plaque. Normal caliber aorta without aneurysm, dissection, vasculitis or significant stenosis. Celiac: Patent without evidence of aneurysm, dissection, vasculitis or significant stenosis. SMA: Patent without evidence of aneurysm, dissection, vasculitis or significant stenosis. Renals: Both renal arteries are patent without evidence of aneurysm, dissection, vasculitis, fibromuscular dysplasia or significant stenosis. IMA: Patent without evidence of aneurysm, dissection, vasculitis or significant stenosis. Inflow: Patent without evidence of aneurysm, dissection, vasculitis or significant stenosis. Proximal Outflow: Bilateral common femoral and visualized portions of the superficial and profunda femoral arteries are patent without evidence of aneurysm, dissection, vasculitis or significant stenosis. Veins: The hepatic, main portal, splenic, superior mesenteric veins are patent. Review of the MIP images confirms the above findings. NON-VASCULAR Lower chest: No acute abnormality. Hepatobiliary: No focal liver abnormality. Status post cholecystectomy. No biliary dilatation. Pancreas: No focal lesion. Normal pancreatic contour. No surrounding inflammatory changes. No main pancreatic ductal dilatation. Spleen: Normal in size without focal abnormality. Adrenals/Urinary Tract: No adrenal nodule bilaterally. Bilateral kidneys enhance symmetrically. No hydronephrosis. No hydroureter. The urinary bladder is unremarkable. Stomach/Bowel: Stomach is within normal limits. No evidence of bowel wall thickening or dilatation. Appendix appears normal. Lymphatic: No lymphadenopathy. Reproductive: Status post hysterectomy. No adnexal masses. Other: No intraperitoneal free fluid. No intraperitoneal free gas. No organized fluid collection. Musculoskeletal: No abdominal wall hernia or abnormality. No suspicious lytic or blastic osseous lesions. No acute displaced fracture. IMPRESSION: VASCULAR 1. No CT evidence of an active  bleed. 2. No acute vascular abnormality. 3.  Aortic Atherosclerosis (ICD10-I70.0). NON-VASCULAR 1. No acute intra-abdominal or intrapelvic abnormality. Electronically Signed   By: Iven Finn M.D.   On: 12/04/2020 21:17      Impression / Plan:   Margert Edsall Boehlke is a 45 y.o. African-American female with no segment past medical history, history of hypertension, chronic NSAID use presented with painless rectal bleeding and presyncope  Rectal bleeding with acute blood loss anemia Last episode was last night.  No further episodes since then Monitor CBC every 8-12 hours, transfuse to maintain hemoglobin above 8 Check iron panel, B12 and folate levels and replete as necessary CT angio negative for active GI bleed Differentials include diverticular bleed or Dieulafoy's lesion or less likely hemorrhoidal bleed Recommend colonoscopy for further evaluation Start clear liquid diet today Ordered bowel prep N.p.o. effective 5 AM  Thank you for involving me in the care of this patient.  Dr. Haig Prophet will be covering for the weekend    LOS: 1 day   Sherri Sear, MD  12/05/2020, 3:39 PM    Note: This dictation was prepared with Dragon dictation along with smaller phrase technology. Any transcriptional errors that result from this process are unintentional.

## 2020-12-05 NOTE — Progress Notes (Signed)
   12/05/20 1830  Clinical Encounter Type  Visited With Patient not available  Visit Type Other (Comment) (rapid response)  Spiritual Encounters  Spiritual Needs Other (Comment) (not assessed)  Chaplain Burris attended the rapid response call. Pt not available while receiving care interventions from nursing staff. Chaplain Deloria Lair is available throughout the night as needed.

## 2020-12-05 NOTE — Significant Event (Signed)
RAPID RESPONSE NOTE: Received message from nurse about pt as follows: in a rapid response with her. took her to the bathroom and almost passed out on the toilet. became very diaphoretic, heart rate in the 140's. bp stable. she had a lot of blood come out in the toilet via rectum. we have her back in bed now. she needs a stat cbc and probably gonna need some blood.  Pt is tachycardiac, diaphoretic, pale and clammy,alert, oriented, no complaints except for nausea.   Heart rate is Regular and normal , no tachycardic. CLTABL. Abd soft Nt.ND. Pt is supine.  Npo. Protonix 80 mg iv now./  Stat h/h. Type and cross one unit and transfuse. Case d/w britton lee about transfer to stepdown and to fyyi if pt declines further may need icu support with pressors and transfusion.

## 2020-12-05 NOTE — Significant Event (Signed)
Rapid Response Event Note   Reason for Call :  Symptomatic rectal bleeding, diaphoretic, dizziness  Initial Focused Assessment:  Rapid response RN arrived in patient's room with patient in bathroom with RN and NT. Patient was sobbing on the commode. Per report from patient's nurse, patient admitted for GI bleed and just had large bloody BM on commode. Became diaphoretic, dizzy, and HR in 130s. BP 137/78. No respiratory distress. No reports of abdominal pain at this time.  Interventions:  Helped patient back to bed with 2A staff. Patient reported feeling better with no dizziness once back in bed and got a fresh gown. HR calmed to 100s after back in bed. BP with SBP still in 130s in bed. Dr. Posey Pronto came and personally reassessed the patient. She ordered one time dose of 80mg  protonix IV push, a stat H&H, and transfer to stepdown. Patient already had type and screen done.   Plan of Care:  MD ordered transfer to stepdown. Lab at bedside to draw ordered labs. Rapid response RN went to ensure unit ready for transfer.   Event Summary:   MD Notified: Dr. Posey Pronto Call Time: 18:26 Arrival Time: 18:27 End Time: 18:49  Jadarious Dobbins, Jaynie Bream, RN

## 2020-12-05 NOTE — [place] Notes (Addendum)
Progress Note    Cassidy Mathis  HUD:[id number] DOB: Jun 09, 1975  DOA: 12/04/2020 PCP: [facility] [place]      Brief Narrative:    Medical records reviewed and are as summarized below:  Cassidy Mathis is a 45 y.o. female with medical history significant for hypertension, NSAID use, who presented to the hospital with large-volume rectal bleeding (bright red blood per rectum).  She felt dizzy after she had multiple bloody stools.  No abdominal pain no vomiting.  She was admitted to the hospital for rectal bleeding complicated by acute blood loss anemia.  She was treated with IV fluids, IV Protonix.    Assessment/Plan:   Principal Problem:   Rectal bleeding Active Problems:   Tobacco use   Uncontrolled hypertension    Body mass index is 37.11 kg/m.  (Morbid obesity)  Rectal bleeding: She is on clear liquid diet.  N.p.o. after 5 AM tomorrow with plan for colonoscopy.  She was on NSAIDs prior to admission.  Continue IV Protonix and IV fluids.  Follow-up with gastroenterologist.  Acute blood loss anemia: Hemoglobin dropped from 11.8-8.9.  No indication for blood transfusion at this time.  Check iron studies, vitamin B12 and folate levels.  Hypertension: Antihypertensives (metoprolol, lisinopril, amlodipine) on hold.    Diet Order             Diet NPO time specified Except for: Ice Chips, Sips with Meds  Diet effective 0500 tomorrow           Diet clear liquid Room service appropriate? Yes; Fluid consistency: Thin  Diet effective now                      Consultants: Gastroenterologist  Procedures: None    Medications:    pantoprazole (PROTONIX) IV  40 mg Intravenous Q12H   polyethylene glycol powder  1 Container Oral Once   Continuous Infusions:  sodium chloride 75 mL/hr at 12/05/20 1045     Anti-infectives (From admission, onward)    None              Family Communication/Anticipated D/C date and  plan/Code Status   DVT prophylaxis: SCDs Start: 12/04/20 2107     Code Status: Full Code  Family Communication: None Disposition Plan: Plan to discharge home in 2 to 3 days   Status is: Inpatient  Remains inpatient appropriate because: Rectal bleeding           Subjective:   No rectal bleeding today.  No abdominal pain or vomiting.  Objective:    Vitals:   12/05/20 0700 12/05/20 0900 12/05/20 1021 12/05/20 1500  BP: 114/61 127/78 131/82 128/84  Pulse: 81 71 76 72  Resp: 17 18 17 17   Temp:  99.2 F (37.3 C) 98 F (36.7 C) 98.6 F (37 C)  TempSrc:  Oral  Oral  SpO2: 99% 99% 100% 99%  Weight:      Height:       Orthostatic VS for the past 24 hrs:  BP- Lying Pulse- Lying BP- Sitting Pulse- Sitting BP- Standing at 0 minutes Pulse- Standing at 0 minutes  12/04/20 2244 (!) 140/94 98 142/86 100 142/86 91     Intake/Output Summary (Last 24 hours) at 12/05/2020 1646 Last data filed at 12/05/2020 1500 Gross per 24 hour  Intake 2492.44 ml  Output --  Net 2492.44 ml   Filed Weights   12/04/20 1850  Weight: 101.2 kg    Exam:  GEN: NAD SKIN: No rash EYES: EOMI ENT: MMM CV: RRR PULM: CTA B ABD: soft, ND, NT, +BS CNS: AAO x 3, non focal EXT: No edema or tenderness       Data Reviewed:   I have personally reviewed following labs and imaging studies:  Labs: Labs show the following:   Basic Metabolic Panel: Recent Labs  Lab 12/04/20 1853 12/05/20 0612  NA 137 139  K 3.7 3.7  CL 105 109  CO2 25 25  GLUCOSE 103* 98  BUN 19 16  CREATININE 0.94 0.91  CALCIUM 8.9 8.0*   GFR Estimated Creatinine Clearance: 93 mL/min (by C-G formula based on SCr of 0.91 mg/dL). Liver Function Tests: Recent Labs  Lab 12/04/20 1853  AST 18  ALT 19  ALKPHOS 59  BILITOT 0.8  PROT 7.3  ALBUMIN 3.6   Recent Labs  Lab 12/04/20 1853  LIPASE 87*   No results for input(s): AMMONIA in the last 168 hours. Coagulation profile Recent Labs  Lab  12/04/20 2151  INR 1.0    CBC: Recent Labs  Lab 12/04/20 1853 12/04/20 2308 12/05/20 0612  WBC 6.4  --  7.6  HGB 11.8* 10.3* 8.9*  HCT 35.6* 30.8* 27.0*  MCV 91.0  --  89.7  PLT 402*  --  307   Cardiac Enzymes: No results for input(s): CKTOTAL, CKMB, CKMBINDEX, TROPONINI in the last 168 hours. BNP (last 3 results) No results for input(s): PROBNP in the last 8760 hours. CBG: No results for input(s): GLUCAP in the last 168 hours. D-Dimer: No results for input(s): DDIMER in the last 72 hours. Hgb A1c: No results for input(s): HGBA1C in the last 72 hours. Lipid Profile: No results for input(s): CHOL, HDL, LDLCALC, TRIG, CHOLHDL, LDLDIRECT in the last 72 hours. Thyroid function studies: No results for input(s): TSH, T4TOTAL, T3FREE, THYROIDAB in the last 72 hours.  Invalid input(s): FREET3 Anemia work up: No results for input(s): VITAMINB12, FOLATE, FERRITIN, TIBC, IRON, RETICCTPCT in the last 72 hours. Sepsis Labs: Recent Labs  Lab 12/04/20 1853 12/05/20 0612  WBC 6.4 7.6    Microbiology Recent Results (from the past 240 hour(s))  Resp Panel by RT-PCR (Flu A&B, Covid) Nasopharyngeal Swab     Status: None   Collection Time: 12/04/20  9:51 PM   Specimen: Nasopharyngeal Swab; Nasopharyngeal(NP) swabs in vial transport medium  Result Value Ref Range Status   SARS Coronavirus 2 by RT PCR NEGATIVE NEGATIVE Final    Comment: (NOTE) SARS-CoV-2 target nucleic acids are NOT DETECTED.  The SARS-CoV-2 RNA is generally detectable in upper respiratory specimens during the acute phase of infection. The lowest concentration of SARS-CoV-2 viral copies this assay can detect is 138 copies/mL. A negative result does not preclude SARS-Cov-2 infection and should not be used as the sole basis for treatment or other patient management decisions. A negative result may occur with  improper specimen collection/handling, submission of specimen other than nasopharyngeal swab, presence of  viral mutation(s) within the areas targeted by this assay, and inadequate number of viral copies(<138 copies/mL). A negative result must be combined with clinical observations, patient history, and epidemiological information. The expected result is Negative.  Fact Sheet for Patients:  EntrepreneurPulse.com.au  Fact Sheet for Healthcare Providers:  IncredibleEmployment.be  This test is no t yet approved or cleared by the [place] FDA and  has been authorized for detection and/or diagnosis of SARS-CoV-2 by FDA under an Emergency Use Authorization (EUA). This EUA will remain  in effect (meaning  this test can be used) for the duration of the COVID-19 declaration under Section 564(b)(1) of the Act, 21 U.S.C.section 360bbb-3(b)(1), unless the authorization is terminated  or revoked sooner.       Influenza A by PCR NEGATIVE NEGATIVE Final   Influenza B by PCR NEGATIVE NEGATIVE Final    Comment: (NOTE) The Xpert Xpress SARS-CoV-2/FLU/RSV plus assay is intended as an aid in the diagnosis of influenza from Nasopharyngeal swab specimens and should not be used as a sole basis for treatment. Nasal washings and aspirates are unacceptable for Xpert Xpress SARS-CoV-2/FLU/RSV testing.  Fact Sheet for Patients: EntrepreneurPulse.com.au  Fact Sheet for Healthcare Providers: IncredibleEmployment.be  This test is not yet approved or cleared by the [place] FDA and has been authorized for detection and/or diagnosis of SARS-CoV-2 by FDA under an Emergency Use Authorization (EUA). This EUA will remain in effect (meaning this test can be used) for the duration of the COVID-19 declaration under Section 564(b)(1) of the Act, 21 U.S.C. section 360bbb-3(b)(1), unless the authorization is terminated or revoked.  Performed at [facility], [address] [place], [place] [address]     Procedures and  diagnostic studies:  CT Angio Abd/Pel w/ and/or w/o  Result Date: 12/04/2020 CLINICAL DATA:  Patient to ED via POV for rectal bleeding. Patient had 1 episode PTA of bright red bloody stools. Patient also complaining of upper abd pain and lightheaded. EXAM: CTA ABDOMEN AND PELVIS WITHOUT AND WITH CONTRAST TECHNIQUE: Multidetector CT imaging of the abdomen and pelvis was performed using the standard protocol during bolus administration of intravenous contrast. Multiplanar reconstructed images and MIPs were obtained and reviewed to evaluate the vascular anatomy. CONTRAST:  13mL OMNIPAQUE IOHEXOL 350 MG/ML SOLN COMPARISON:  None. FINDINGS: VASCULAR Aorta: Mild atherosclerotic plaque. Normal caliber aorta without aneurysm, dissection, vasculitis or significant stenosis. Celiac: Patent without evidence of aneurysm, dissection, vasculitis or significant stenosis. SMA: Patent without evidence of aneurysm, dissection, vasculitis or significant stenosis. Renals: Both renal arteries are patent without evidence of aneurysm, dissection, vasculitis, fibromuscular dysplasia or significant stenosis. IMA: Patent without evidence of aneurysm, dissection, vasculitis or significant stenosis. Inflow: Patent without evidence of aneurysm, dissection, vasculitis or significant stenosis. Proximal Outflow: Bilateral common femoral and visualized portions of the superficial and profunda femoral arteries are patent without evidence of aneurysm, dissection, vasculitis or significant stenosis. Veins: The hepatic, main portal, splenic, superior mesenteric veins are patent. Review of the MIP images confirms the above findings. NON-VASCULAR Lower chest: No acute abnormality. Hepatobiliary: No focal liver abnormality. Status post cholecystectomy. No biliary dilatation. Pancreas: No focal lesion. Normal pancreatic contour. No surrounding inflammatory changes. No main pancreatic ductal dilatation. Spleen: Normal in size without focal  abnormality. Adrenals/Urinary Tract: No adrenal nodule bilaterally. Bilateral kidneys enhance symmetrically. No hydronephrosis. No hydroureter. The urinary bladder is unremarkable. Stomach/Bowel: Stomach is within normal limits. No evidence of bowel wall thickening or dilatation. Appendix appears normal. Lymphatic: No lymphadenopathy. Reproductive: Status post hysterectomy. No adnexal masses. Other: No intraperitoneal free fluid. No intraperitoneal free gas. No organized fluid collection. Musculoskeletal: No abdominal wall hernia or abnormality. No suspicious lytic or blastic osseous lesions. No acute displaced fracture. IMPRESSION: VASCULAR 1. No CT evidence of an active bleed. 2. No acute vascular abnormality. 3.  Aortic Atherosclerosis (ICD10-I70.0). NON-VASCULAR 1. No acute intra-abdominal or intrapelvic abnormality. Electronically Signed   By: Iven Finn M.D.   On: 12/04/2020 21:17               LOS: 1 day  Alexzandria Massman  [employer], advertising on [email or web address]. If 7PM-7AM, please contact night-coverage at [email or web address]     12/05/2020, 4:46 PM

## 2020-12-05 NOTE — ED Notes (Signed)
Matthew RN aware of assigned bed 

## 2020-12-05 NOTE — ED Notes (Signed)
Report to Rodman Key, RN, pt moving to C-pod area.

## 2020-12-06 ENCOUNTER — Encounter
Admission: EM | Disposition: A | Discharge status: Home / Self Care | Payer: Self-pay | Source: Home / Self Care | Attending: Internal Medicine

## 2020-12-06 ENCOUNTER — Inpatient Hospital Stay: Payer: Medicaid Other | Admitting: Anesthesiology

## 2020-12-06 ENCOUNTER — Inpatient Hospital Stay: Payer: Medicaid Other

## 2020-12-06 DIAGNOSIS — K625 Hemorrhage of anus and rectum: Secondary | ICD-10-CM | POA: Diagnosis not present

## 2020-12-06 DIAGNOSIS — E538 Deficiency of other specified B group vitamins: Secondary | ICD-10-CM | POA: Diagnosis present

## 2020-12-06 HISTORY — PX: COLONOSCOPY WITH PROPOFOL: SHX5780

## 2020-12-06 HISTORY — PX: ESOPHAGOGASTRODUODENOSCOPY: SHX5428

## 2020-12-06 LAB — CBC
HCT: 26.6 % — ABNORMAL LOW (ref 36.0–46.0)
HCT: 24 % — ABNORMAL LOW (ref 36.0–46.0)
Hemoglobin: 9 g/dL — ABNORMAL LOW (ref 12.0–15.0)
Hemoglobin: 8.3 g/dL — ABNORMAL LOW (ref 12.0–15.0)
MCH: 30.1 pg (ref 26.0–34.0)
MCH: 30.5 pg (ref 26.0–34.0)
MCHC: 33.8 g/dL (ref 30.0–36.0)
MCHC: 34.6 g/dL (ref 30.0–36.0)
MCV: 89 fL (ref 80.0–100.0)
MCV: 88.2 fL (ref 80.0–100.0)
Platelets: 295 10*3/uL (ref 150–400)
Platelets: 274 10*3/uL (ref 150–400)
RBC: 2.99 MIL/uL — ABNORMAL LOW (ref 3.87–5.11)
RBC: 2.72 MIL/uL — ABNORMAL LOW (ref 3.87–5.11)
RDW: 13.6 % (ref 11.5–15.5)
RDW: 13.6 % (ref 11.5–15.5)
WBC: 7.5 10*3/uL (ref 4.0–10.5)
WBC: 8.3 10*3/uL (ref 4.0–10.5)
nRBC: 0 % (ref 0.0–0.2)
nRBC: 0 % (ref 0.0–0.2)

## 2020-12-06 LAB — MRSA NEXT GEN BY PCR, NASAL: MRSA by PCR Next Gen: NOT DETECTED

## 2020-12-06 SURGERY — COLONOSCOPY WITH PROPOFOL
Anesthesia: Monitor Anesthesia Care

## 2020-12-06 SURGERY — ESOPHAGOGASTRODUODENOSCOPY (EGD) WITH PROPOFOL
Anesthesia: Monitor Anesthesia Care

## 2020-12-06 MED ORDER — ONDANSETRON HCL 4 MG/2ML IJ SOLN: 4.0000 mg | Freq: Once | INTRAMUSCULAR | Status: DC | PRN

## 2020-12-06 MED ORDER — IOHEXOL 350 MG/ML SOLN
100.0000 mL | Freq: Once | INTRAVENOUS | Status: AC | PRN
  Administered 2020-12-06: 100 mL via INTRAVENOUS

## 2020-12-06 MED ORDER — PHENYLEPHRINE HCL-NACL 20-0.9 MG/250ML-% IV SOLN
INTRAVENOUS | Status: AC
  Filled 2020-12-06: qty 250

## 2020-12-06 MED ORDER — LIDOCAINE HCL (CARDIAC) PF 100 MG/5ML IV SOSY
PREFILLED_SYRINGE | INTRAVENOUS | Status: DC | PRN
  Administered 2020-12-06: 200 mg via INTRAVENOUS

## 2020-12-06 MED ORDER — PROPOFOL 500 MG/50ML IV EMUL
INTRAVENOUS | Status: DC | PRN
  Administered 2020-12-06: 50 mg via INTRAVENOUS
  Administered 2020-12-06: 150 mg via INTRAVENOUS
  Administered 2020-12-06: 200 ug/kg/min via INTRAVENOUS

## 2020-12-06 MED ORDER — PROPOFOL 500 MG/50ML IV EMUL
INTRAVENOUS | Status: AC
  Filled 2020-12-06: qty 50

## 2020-12-06 MED ORDER — SODIUM CHLORIDE 0.9 % IV SOLN: INTRAVENOUS | Status: DC

## 2020-12-06 MED ORDER — CYANOCOBALAMIN 1000 MCG/ML IJ SOLN
1000.0000 ug | Freq: Every day | INTRAMUSCULAR | Status: DC
Start: 2020-12-06 — End: 2020-12-09
  Administered 2020-12-06 – 2020-12-09 (×4): 1000 ug via SUBCUTANEOUS
  Filled 2020-12-06 (×4): qty 1

## 2020-12-06 MED ORDER — LIDOCAINE HCL (PF) 2 % IJ SOLN
INTRAMUSCULAR | Status: AC
  Filled 2020-12-06: qty 5

## 2020-12-06 MED ORDER — HYDROCODONE-ACETAMINOPHEN 7.5-325 MG PO TABS
1.0000 | ORAL_TABLET | Freq: Once | ORAL | Status: DC | PRN
  Filled 2020-12-06: qty 1

## 2020-12-06 NOTE — [place] Notes (Signed)
Progress Note    Cassidy Mathis  WCH:[id number] DOB: [birth year]  DOA: 12/04/2020 PCP: [facility] [place]      Brief Narrative:    Medical records reviewed and are as summarized below:  Cassidy Mathis is a 45 y.o. female with medical history significant for hypertension, NSAID use, who presented to the hospital with large-volume rectal bleeding (bright red blood per rectum).  She felt dizzy after she had multiple bloody stools.  No abdominal pain no vomiting.  She was admitted to the hospital for rectal bleeding complicated by acute blood loss anemia.  She was treated with IV fluids, IV Protonix.    Assessment/Plan:   Principal Problem:   Rectal bleeding Active Problems:   Tobacco use   Uncontrolled hypertension   Vitamin B12 deficiency    Body mass index is 37.11 kg/m.  (Morbid obesity)  Acute rectal bleeding: S/p EGD and colonoscopy on 12/06/2020.  EGD was unremarkable and colonoscopy showed internal hemorrhoids.  Start clear liquid diet.  Monitor for rebleeding.  Plan for tagged RBC scan if bleeding recurs.   Acute blood loss anemia: S/p transfusion with 1 unit of PRBCs.  H&H is stable.  Continue to monitor H&H.   Vitamin B12 deficiency: Start vitamin B12 injections  Hypertension: BP is okay for now.  Antihypertensives (metoprolol, lisinopril, amlodipine) on hold.    Diet Order             Diet clear liquid Room service appropriate? Yes; Fluid consistency: Thin  Diet effective now                      Consultants: Gastroenterologist  Procedures: EGD and colonoscopy    Medications:    Chlorhexidine Gluconate Cloth  6 each Topical Daily   cyanocobalamin  1,000 mcg Subcutaneous Daily   Continuous Infusions:     Anti-infectives (From admission, onward)    None              Family Communication/Anticipated D/C date and plan/Code Status   DVT prophylaxis: SCDs Start: 12/04/20 2107     Code  Status: Full Code  Family Communication: None Disposition Plan: Possible discharge to home tomorrow   Status is: Inpatient  Remains inpatient appropriate because: Rectal bleeding           Subjective:   Interval events noted.  Rapid response was called last night after patient had near syncope following passage of large amount of bright red blood per rectum.  She was transferred to stepdown unit for monitoring overnight.  No rectal bleeding today.  She is s/p EGD and colonoscopy today  Objective:    Vitals:   12/06/20 0950 12/06/20 1000 12/06/20 1031 12/06/20 1100  BP: 115/70 131/76 128/73 121/70  Pulse: 81 80 80 68  Resp:  16 12 15   Temp: 98.1 F (36.7 C)     TempSrc:      SpO2: 100% 100% 100% 100%  Weight:      Height:       No data found.    Intake/Output Summary (Last 24 hours) at 12/06/2020 1213 Last data filed at 12/06/2020 0954 Gross per 24 hour  Intake 2534.45 ml  Output --  Net 2534.45 ml   Filed Weights   12/04/20 1850  Weight: 101.2 kg    Exam:  GEN: NAD SKIN: No rash EYES: EOMI ENT: MMM CV: RRR PULM: CTA B ABD: soft, obese, mild lower abdominal tenderness, no rebound tenderness  or guarding, +BS CNS: AAO x 3, non focal EXT: No edema or tenderness        Data Reviewed:   I have personally reviewed following labs and imaging studies:  Labs: Labs show the following:   Basic Metabolic Panel: Recent Labs  Lab 12/04/20 1853 12/05/20 0612  NA 137 139  K 3.7 3.7  CL 105 109  CO2 25 25  GLUCOSE 103* 98  BUN 19 16  CREATININE 0.94 0.91  CALCIUM 8.9 8.0*   GFR Estimated Creatinine Clearance: 93 mL/min (by C-G formula based on SCr of 0.91 mg/dL). Liver Function Tests: Recent Labs  Lab 12/04/20 1853  AST 18  ALT 19  ALKPHOS 59  BILITOT 0.8  PROT 7.3  ALBUMIN 3.6   Recent Labs  Lab 12/04/20 1853  LIPASE 87*   No results for input(s): AMMONIA in the last 168 hours. Coagulation profile Recent Labs  Lab  12/04/20 2151  INR 1.0    CBC: Recent Labs  Lab 12/04/20 1853 12/04/20 2308 12/05/20 0612 12/05/20 1714 12/05/20 1853 12/06/20 0633  WBC 6.4  --  7.6 6.4  --  7.5  HGB 11.8* 10.3* 8.9* 8.9* 8.0* 9.0*  HCT 35.6* 30.8* 27.0* 26.8* 24.7* 26.6*  MCV 91.0  --  89.7 90.2  --  89.0  PLT 402*  --  307 346  --  295   Cardiac Enzymes: No results for input(s): CKTOTAL, CKMB, CKMBINDEX, TROPONINI in the last 168 hours. BNP (last 3 results) No results for input(s): PROBNP in the last 8760 hours. CBG: Recent Labs  Lab 12/05/20 1834 12/05/20 2038  GLUCAP 130* 103*   D-Dimer: No results for input(s): DDIMER in the last 72 hours. Hgb A1c: No results for input(s): HGBA1C in the last 72 hours. Lipid Profile: No results for input(s): CHOL, HDL, LDLCALC, TRIG, CHOLHDL, LDLDIRECT in the last 72 hours. Thyroid function studies: No results for input(s): TSH, T4TOTAL, T3FREE, THYROIDAB in the last 72 hours.  Invalid input(s): FREET3 Anemia work up: Recent Labs    12/05/20 1714  VITAMINB12 150*  FOLATE >24.8  FERRITIN 76  TIBC 351  IRON 147   Sepsis Labs: Recent Labs  Lab 12/04/20 1853 12/05/20 0612 12/05/20 1714 12/06/20 0633  WBC 6.4 7.6 6.4 7.5    Microbiology Recent Results (from the past 240 hour(s))  Resp Panel by RT-PCR (Flu A&B, Covid) Nasopharyngeal Swab     Status: None   Collection Time: 12/04/20  9:51 PM   Specimen: Nasopharyngeal Swab; Nasopharyngeal(NP) swabs in vial transport medium  Result Value Ref Range Status   SARS Coronavirus 2 by RT PCR NEGATIVE NEGATIVE Final    Comment: (NOTE) SARS-CoV-2 target nucleic acids are NOT DETECTED.  The SARS-CoV-2 RNA is generally detectable in upper respiratory specimens during the acute phase of infection. The lowest concentration of SARS-CoV-2 viral copies this assay can detect is 138 copies/mL. A negative result does not preclude SARS-Cov-2 infection and should not be used as the sole basis for treatment  or other patient management decisions. A negative result may occur with  improper specimen collection/handling, submission of specimen other than nasopharyngeal swab, presence of viral mutation(s) within the areas targeted by this assay, and inadequate number of viral copies(<138 copies/mL). A negative result must be combined with clinical observations, patient history, and epidemiological information. The expected result is Negative.  Fact Sheet for Patients:  EntrepreneurPulse.com.au  Fact Sheet for Healthcare Providers:  IncredibleEmployment.be  This test is no t yet approved or cleared by  the [employer] and  has been authorized for detection and/or diagnosis of SARS-CoV-2 by FDA under an Emergency Use Authorization (EUA). This EUA will remain  in effect (meaning this test can be used) for the duration of the COVID-19 declaration under Section 564(b)(1) of the Act, 21 U.S.C.section 360bbb-3(b)(1), unless the authorization is terminated  or revoked sooner.       Influenza A by PCR NEGATIVE NEGATIVE Final   Influenza B by PCR NEGATIVE NEGATIVE Final    Comment: (NOTE) The Xpert Xpress SARS-CoV-2/FLU/RSV plus assay is intended as an aid in the diagnosis of influenza from Nasopharyngeal swab specimens and should not be used as a sole basis for treatment. Nasal washings and aspirates are unacceptable for Xpert Xpress SARS-CoV-2/FLU/RSV testing.  Fact Sheet for Patients: EntrepreneurPulse.com.au  Fact Sheet for Healthcare Providers: IncredibleEmployment.be  This test is not yet approved or cleared by the [place] FDA and has been authorized for detection and/or diagnosis of SARS-CoV-2 by FDA under an Emergency Use Authorization (EUA). This EUA will remain in effect (meaning this test can be used) for the duration of the COVID-19 declaration under Section 564(b)(1) of the Act, 21 U.S.C. section  360bbb-3(b)(1), unless the authorization is terminated or revoked.  Performed at [facility], [address] [place], [place] [address]   MRSA Next Gen by PCR, Nasal     Status: None   Collection Time: 12/05/20  8:50 PM   Specimen: Nasal Mucosa; Nasal Swab  Result Value Ref Range Status   MRSA by PCR Next Gen NOT DETECTED NOT DETECTED Final    Comment: (NOTE) The GeneXpert MRSA Assay (FDA approved for NASAL specimens only), is one component of a comprehensive MRSA colonization surveillance program. It is not intended to diagnose MRSA infection nor to guide or monitor treatment for MRSA infections. Test performance is not FDA approved in patients less than 40 years old. Performed at [facility], [address] [place], [place] [address]     Procedures and diagnostic studies:  CT Angio Abd/Pel w/ and/or w/o  Result Date: 12/06/2020 CLINICAL DATA:  Bright red blood per rectum EXAM: CTA ABDOMEN AND PELVIS WITHOUT AND WITH CONTRAST TECHNIQUE: Multidetector CT imaging of the abdomen and pelvis was performed using the standard protocol during bolus administration of intravenous contrast. Multiplanar reconstructed images and MIPs were obtained and reviewed to evaluate the vascular anatomy. CONTRAST:  181mL OMNIPAQUE IOHEXOL 350 MG/ML SOLN COMPARISON:  CTA abdomen/pelvis dated 12/04/2020 FINDINGS: VASCULAR Aorta: Patent.  No evidence of aneurysm. Celiac: Patent. SMA: Patent. Renals: Patent bilaterally. IMA: Patent. Inflow: Patent bilaterally. Proximal Outflow: Patent bilaterally. Veins: Within normal limits. No evidence of active GI bleeding. Review of the MIP images confirms the above findings. NON-VASCULAR Lower chest: Lung bases are clear. Hepatobiliary: Liver is within normal limits. Status post cholecystectomy. No intrahepatic or extrahepatic duct dilatation. Pancreas: Within normal limits. Spleen: Within normal limits. Adrenals/Urinary Tract: Adrenal glands are within  normal limits. Probable subcentimeter cyst in the interpolar right kidney (series 12/image 32). Left kidney is within normal limits. No hydronephrosis. Bladder is mildly thick-walled but underdistended. Stomach/Bowel: Stomach is within normal limits. No evidence of bowel obstruction. Normal appendix (series 12/image 86). No colonic wall thickening or mass. Lymphatic: No suspicious abdominopelvic lymphadenopathy. Reproductive: Status post hysterectomy. No adnexal masses. Other: No abdominopelvic ascites. Musculoskeletal: Visualized osseous structures are within normal limits. IMPRESSION: No evidence of active GI bleeding. No bowel wall thickening or mass is evident on CT. Electronically Signed   By:  Julian Hy M.D.   On: 12/06/2020 03:22   CT Angio Abd/Pel w/ and/or w/o  Result Date: 12/04/2020 CLINICAL DATA:  Patient to ED via POV for rectal bleeding. Patient had 1 episode PTA of bright red bloody stools. Patient also complaining of upper abd pain and lightheaded. EXAM: CTA ABDOMEN AND PELVIS WITHOUT AND WITH CONTRAST TECHNIQUE: Multidetector CT imaging of the abdomen and pelvis was performed using the standard protocol during bolus administration of intravenous contrast. Multiplanar reconstructed images and MIPs were obtained and reviewed to evaluate the vascular anatomy. CONTRAST:  13mL OMNIPAQUE IOHEXOL 350 MG/ML SOLN COMPARISON:  None. FINDINGS: VASCULAR Aorta: Mild atherosclerotic plaque. Normal caliber aorta without aneurysm, dissection, vasculitis or significant stenosis. Celiac: Patent without evidence of aneurysm, dissection, vasculitis or significant stenosis. SMA: Patent without evidence of aneurysm, dissection, vasculitis or significant stenosis. Renals: Both renal arteries are patent without evidence of aneurysm, dissection, vasculitis, fibromuscular dysplasia or significant stenosis. IMA: Patent without evidence of aneurysm, dissection, vasculitis or significant stenosis. Inflow: Patent  without evidence of aneurysm, dissection, vasculitis or significant stenosis. Proximal Outflow: Bilateral common femoral and visualized portions of the superficial and profunda femoral arteries are patent without evidence of aneurysm, dissection, vasculitis or significant stenosis. Veins: The hepatic, main portal, splenic, superior mesenteric veins are patent. Review of the MIP images confirms the above findings. NON-VASCULAR Lower chest: No acute abnormality. Hepatobiliary: No focal liver abnormality. Status post cholecystectomy. No biliary dilatation. Pancreas: No focal lesion. Normal pancreatic contour. No surrounding inflammatory changes. No main pancreatic ductal dilatation. Spleen: Normal in size without focal abnormality. Adrenals/Urinary Tract: No adrenal nodule bilaterally. Bilateral kidneys enhance symmetrically. No hydronephrosis. No hydroureter. The urinary bladder is unremarkable. Stomach/Bowel: Stomach is within normal limits. No evidence of bowel wall thickening or dilatation. Appendix appears normal. Lymphatic: No lymphadenopathy. Reproductive: Status post hysterectomy. No adnexal masses. Other: No intraperitoneal free fluid. No intraperitoneal free gas. No organized fluid collection. Musculoskeletal: No abdominal wall hernia or abnormality. No suspicious lytic or blastic osseous lesions. No acute displaced fracture. IMPRESSION: VASCULAR 1. No CT evidence of an active bleed. 2. No acute vascular abnormality. 3.  Aortic Atherosclerosis (ICD10-I70.0). NON-VASCULAR 1. No acute intra-abdominal or intrapelvic abnormality. Electronically Signed   By: Iven Finn M.D.   On: 12/04/2020 21:17               LOS: 2 days   Patric Buckhalter  [employer]   Pager on [email or web address]. If 7PM-7AM, please contact night-coverage at [email or web address]     12/06/2020, 12:13 PM

## 2020-12-06 NOTE — [place] Notes (Signed)
Sharion Settler NP requested that I hold off on the second unit of blood until cbc drawn, still waiting for lab to come draw patient.

## 2020-12-06 NOTE — Op Note (Signed)
[facility] Gastroenterology Patient Name: Cassidy Mathis Procedure Date: 12/06/2020 8:58 AM MRN: [id number] Account #: [id number] Date of Birth: 04/01/1975 Admit Type: Inpatient Age: 45 Room: [facility] ENDO ROOM 4 Gender: Female Note Status: Finalized Instrument Name: Jasper Riling [id number] Procedure:             Colonoscopy Indications:           Hematochezia Providers:             Andrey Farmer MD, MD Referring MD:          Baird Cancer. Jacqualine Code (Referring MD) Medicines:             Monitored Anesthesia Care Complications:         No immediate complications. Procedure:             Pre-Anesthesia Assessment:                        - Prior to the procedure, a History and Physical was                         performed, and patient medications and allergies were                         reviewed. The patient is competent. The risks and                         benefits of the procedure and the sedation options and                         risks were discussed with the patient. All questions                         were answered and informed consent was obtained.                         Patient identification and proposed procedure were                         verified by the physician, the nurse, the anesthetist                         and the technician in the endoscopy suite. Mental                         Status Examination: alert and oriented. Airway                         Examination: normal oropharyngeal airway and neck                         mobility. Respiratory Examination: clear to                         auscultation. CV Examination: normal. Prophylactic                         Antibiotics: The patient does not require prophylactic  antibiotics. Prior Anticoagulants: The patient has                         taken no previous anticoagulant or antiplatelet                         agents. ASA Grade Assessment: III - A patient with                          severe systemic disease. After reviewing the risks and                         benefits, the patient was deemed in satisfactory                         condition to undergo the procedure. The anesthesia                         plan was to use monitored anesthesia care (MAC).                         Immediately prior to administration of medications,                         the patient was re-assessed for adequacy to receive                         sedatives. The heart rate, respiratory rate, oxygen                         saturations, blood pressure, adequacy of pulmonary                         ventilation, and response to care were monitored                         throughout the procedure. The physical status of the                         patient was re-assessed after the procedure.                        After obtaining informed consent, the colonoscope was                         passed under direct vision. Throughout the procedure,                         the patient's blood pressure, pulse, and oxygen                         saturations were monitored continuously. The                         Colonoscope was introduced through the anus and                         advanced to the the terminal ileum. The colonoscopy  was performed without difficulty. The patient                         tolerated the procedure well. The quality of the bowel                         preparation was good. Findings:      The perianal and digital rectal examinations were normal.      The terminal ileum appeared normal.      Internal hemorrhoids were found during retroflexion. The hemorrhoids       were Grade I (internal hemorrhoids that do not prolapse).      The exam was otherwise without abnormality on direct and retroflexion       views. Impression:            - The examined portion of the ileum was normal.                        - Internal hemorrhoids.                         - The examination was otherwise normal on direct and                         retroflexion views.                        - No specimens collected. Recommendation:        - Return patient to hospital ward for ongoing care.                        - Clear liquid diet.                        - Continue present medications.                        - Monitor for any recurrent bleeding today and keep on                         clears. If any recurrent bleeding would need tagged                         RBC scan +- VCE Procedure Code(s):     --- Professional ---                        [phone], Colonoscopy, flexible; diagnostic, including                         collection of specimen(s) by brushing or washing, when                         performed (separate procedure) Diagnosis Code(s):     --- Professional ---                        K64.0, First degree hemorrhoids                        K92.1, Melena (includes Hematochezia) CPT copyright 2019 American  Medical Association. All rights reserved. The codes documented in this report are preliminary and upon coder review may  be revised to meet current compliance requirements. Andrey Farmer MD, MD 12/06/2020 10:01:31 AM Number of Addenda: 0 Note Initiated On: 12/06/2020 8:58 AM Scope Withdrawal Time: 0 hours 9 minutes 12 seconds  Total Procedure Duration: 0 hours 14 minutes 11 seconds  Estimated Blood Loss:  Estimated blood loss: none.      [facility]

## 2020-12-06 NOTE — Transfer of Care (Signed)
Immediate Anesthesia Transfer of Care Note  Patient: Cassidy Mathis  Procedure(s) Performed: COLONOSCOPY WITH PROPOFOL ESOPHAGOGASTRODUODENOSCOPY (EGD)  Patient Location: PACU  Anesthesia Type:MAC  Level of Consciousness: drowsy and patient cooperative  Airway & Oxygen Therapy: Patient Spontanous Breathing and Patient connected to nasal cannula oxygen  Post-op Assessment: Report given to RN and Post -op Vital signs reviewed and stable  Post vital signs: Reviewed and stable  Last Vitals:  Vitals Value Taken Time  BP 115/70 0955  Temp    Pulse 87 12/06/20 0953  Resp 16   SpO2 96 % 12/06/20 0953  Vitals shown include unvalidated device data.  Last Pain:  Vitals:   12/06/20 0917  TempSrc: Temporal  PainSc: 0-No pain      Patients Stated Pain Goal: 0 (37/62/83 1517)  Complications: No notable events documented.

## 2020-12-06 NOTE — Plan of Care (Signed)
Neuro: alert and oriented, stable gait Resp: stable on room air CV: afebrile, vital signs stable, no edema GIGU: using bedside commode safely, no further bloody stools following procedure, tolerated clear liquid tray Skin: clean and intact Social: Family at the bedside following procedure, all questions and concerns addressed  Events: Colonoscopy and Upper GI study completed; Transferred to room 116 report given to Raquel Sarna  Problem: Education: Goal: Knowledge of General Education information will improve Description: Including pain rating scale, medication(s)/side effects and non-pharmacologic comfort measures Outcome: Progressing   Problem: Health Behavior/Discharge Planning: Goal: Ability to manage health-related needs will improve Outcome: Progressing   Problem: Clinical Measurements: Goal: Ability to maintain clinical measurements within normal limits will improve Outcome: Progressing Goal: Will remain free from infection Outcome: Progressing Goal: Diagnostic test results will improve Outcome: Progressing Goal: Respiratory complications will improve Outcome: Progressing Goal: Cardiovascular complication will be avoided Outcome: Progressing   Problem: Activity: Goal: Risk for activity intolerance will decrease Outcome: Progressing   Problem: Nutrition: Goal: Adequate nutrition will be maintained Outcome: Progressing   Problem: Coping: Goal: Level of anxiety will decrease Outcome: Progressing   Problem: Elimination: Goal: Will not experience complications related to bowel motility Outcome: Progressing Goal: Will not experience complications related to urinary retention Outcome: Progressing   Problem: Pain Managment: Goal: General experience of comfort will improve Outcome: Progressing   Problem: Safety: Goal: Ability to remain free from injury will improve Outcome: Progressing   Problem: Skin Integrity: Goal: Risk for impaired skin integrity will  decrease Outcome: Progressing   Problem: Education: Goal: Ability to identify signs and symptoms of gastrointestinal bleeding will improve Outcome: Progressing   Problem: Bowel/Gastric: Goal: Will show no signs and symptoms of gastrointestinal bleeding Outcome: Progressing   Problem: Fluid Volume: Goal: Will show no signs and symptoms of excessive bleeding Outcome: Progressing   Problem: Clinical Measurements: Goal: Complications related to the disease process, condition or treatment will be avoided or minimized Outcome: Progressing

## 2020-12-06 NOTE — Anesthesia Preprocedure Evaluation (Addendum)
Anesthesia Evaluation  Patient identified by MRN, date of birth, ID band Patient awake    Reviewed: Allergy & Precautions, NPO status , Patient's Chart, lab work & pertinent test results  Airway Mallampati: II  TM Distance: >3 FB Neck ROM: Full    Dental  (+) Teeth Intact   Pulmonary asthma , pneumonia, Current Smoker and Patient abstained from smoking.,           Cardiovascular hypertension (poorly controlled), Pt. on medications + Valvular Problems/Murmurs      Neuro/Psych  Headaches, PSYCHIATRIC DISORDERS Depression    GI/Hepatic GERD  ,Rectal bleed pancreatitis   Endo/Other  obesity  Renal/GU      Musculoskeletal   Abdominal   Peds  Hematology  (+) anemia ,   Anesthesia Other Findings Cervical cancer  Reproductive/Obstetrics                            Anesthesia Physical Anesthesia Plan  ASA: 3  Anesthesia Plan: MAC   Post-op Pain Management:    Induction: Intravenous  PONV Risk Score and Plan:   Airway Management Planned: Nasal Cannula and Mask  Additional Equipment:   Intra-op Plan:   Post-operative Plan:   Informed Consent: I have reviewed the patients History and Physical, chart, labs and discussed the procedure including the risks, benefits and alternatives for the proposed anesthesia with the patient or authorized representative who has indicated his/her understanding and acceptance.       Plan Discussed with:   Anesthesia Plan Comments:        Anesthesia Quick Evaluation

## 2020-12-06 NOTE — Op Note (Signed)
[facility] Gastroenterology Patient Name: Cassidy Mathis Procedure Date: 12/06/2020 9:07 AM MRN: [id number] Account #: [id number] Date of Birth: [birth year] Admit Type: Inpatient Age: 45 Room: 4 Gender: Female Note Status: Finalized Instrument Name: Upper Endoscope [id number] Procedure:             Upper GI endoscopy Indications:           Hematochezia Providers:             Andrey Farmer MD, MD Referring MD:          Baird Cancer. Jacqualine Code (Referring MD) Medicines:             Monitored Anesthesia Care Complications:         No immediate complications. Procedure:             Pre-Anesthesia Assessment:                        - Prior to the procedure, a History and Physical was                         performed, and patient medications and allergies were                         reviewed. The patient is competent. The risks and                         benefits of the procedure and the sedation options and                         risks were discussed with the patient. All questions                         were answered and informed consent was obtained.                         Patient identification and proposed procedure were                         verified by the physician, the nurse, the anesthetist                         and the technician in the endoscopy suite. Mental                         Status Examination: alert and oriented. Airway                         Examination: normal oropharyngeal airway and neck                         mobility. Respiratory Examination: clear to                         auscultation. CV Examination: normal. Prophylactic                         Antibiotics: The patient does not require prophylactic  antibiotics. Prior Anticoagulants: The patient has                         taken no previous anticoagulant or antiplatelet                         agents. ASA Grade Assessment: III - A patient with                          severe systemic disease. After reviewing the risks and                         benefits, the patient was deemed in satisfactory                         condition to undergo the procedure. The anesthesia                         plan was to use monitored anesthesia care (MAC).                         Immediately prior to administration of medications,                         the patient was re-assessed for adequacy to receive                         sedatives. The heart rate, respiratory rate, oxygen                         saturations, blood pressure, adequacy of pulmonary                         ventilation, and response to care were monitored                         throughout the procedure. The physical status of the                         patient was re-assessed after the procedure.                        After obtaining informed consent, the endoscope was                         passed under direct vision. Throughout the procedure,                         the patient's blood pressure, pulse, and oxygen                         saturations were monitored continuously. The                         Endosonoscope was introduced through the mouth, and                         advanced to the second part of duodenum. The upper GI  endoscopy was accomplished without difficulty. The                         patient tolerated the procedure well. Findings:      The examined esophagus was normal.      The entire examined stomach was normal.      The examined duodenum was normal. Impression:            - Normal esophagus.                        - Normal stomach.                        - Normal examined duodenum.                        - No specimens collected. Recommendation:        - Perform a colonoscopy today. Procedure Code(s):     --- Professional ---                        [phone], Esophagogastroduodenoscopy, flexible,                         transoral; diagnostic,  including collection of                         specimen(s) by brushing or washing, when performed                         (separate procedure) Diagnosis Code(s):     --- Professional ---                        K92.1, Melena (includes Hematochezia) CPT copyright 2019 American Medical Association. All rights reserved. The codes documented in this report are preliminary and upon coder review may  be revised to meet current compliance requirements. Andrey Farmer MD, MD 12/06/2020 9:54:48 AM Number of Addenda: 0 Note Initiated On: 12/06/2020 9:07 AM Estimated Blood Loss:  Estimated blood loss: none.      [facility]

## 2020-12-06 NOTE — Anesthesia Postprocedure Evaluation (Signed)
Anesthesia Post Note  Patient: Cassidy Mathis  Procedure(s) Performed: COLONOSCOPY WITH PROPOFOL ESOPHAGOGASTRODUODENOSCOPY (EGD)  Anesthesia Type: MAC Anesthetic complications: no   No notable events documented.   Last Vitals:  Vitals:   12/06/20 0950 12/06/20 1000  BP: 115/70 131/76  Pulse: 81 80  Resp:  16  Temp: 36.7 C   SpO2: 100% 100%    Last Pain:  Vitals:   12/06/20 1015  TempSrc:   PainSc: Asleep                 Deno Etienne

## 2020-12-06 NOTE — Care Plan (Signed)
See procedure reports for procedure information. No source of bleeding seen. Interestingly patient said she had bright red blood about 30 minutes before colonoscopy but there was no blood in the colon. Possible anal outlet bleeding. Regardless, if recurrent bleeding overnight would need CT bleeding protocol or tagged RBC scan.  Raylene Miyamoto MD, MPH

## 2020-12-07 ENCOUNTER — Inpatient Hospital Stay: Payer: Medicaid Other

## 2020-12-07 DIAGNOSIS — D62 Acute posthemorrhagic anemia: Secondary | ICD-10-CM | POA: Diagnosis present

## 2020-12-07 DIAGNOSIS — E538 Deficiency of other specified B group vitamins: Secondary | ICD-10-CM | POA: Diagnosis not present

## 2020-12-07 DIAGNOSIS — K625 Hemorrhage of anus and rectum: Secondary | ICD-10-CM | POA: Diagnosis not present

## 2020-12-07 LAB — HEMOGLOBIN AND HEMATOCRIT, BLOOD
HCT: 21.1 % — ABNORMAL LOW (ref 36.0–46.0)
Hemoglobin: 6.8 g/dL — ABNORMAL LOW (ref 12.0–15.0)

## 2020-12-07 LAB — PREPARE RBC (CROSSMATCH)

## 2020-12-07 MED ORDER — SODIUM CHLORIDE 0.9 % IV BOLUS
1000.0000 mL | INTRAVENOUS | Status: AC
  Administered 2020-12-07: 07:00:00 1000 mL via INTRAVENOUS

## 2020-12-07 MED ORDER — TECHNETIUM TC 99M-LABELED RED BLOOD CELLS IV KIT
20.0000 | PACK | Freq: Once | INTRAVENOUS | Status: AC | PRN
  Administered 2020-12-07: 22.59 via INTRAVENOUS

## 2020-12-07 MED ORDER — SODIUM CHLORIDE 0.9% IV SOLUTION: Freq: Once | INTRAVENOUS | Status: AC

## 2020-12-07 NOTE — Significant Event (Signed)
Rapid Response Event Note   Reason for Call : called for near syncopal episode on toilet.   Initial Focused Assessment: pt on toilet, c/o of dizziness and lightheaded. Bright red blood in bowl. 4 person assist back to bed. VSS after...      Interventions: Dr Mal Misty notified. NS bolus, and stat HGB ordered.   Plan of Care: Nurse Theadora Rama, and Charge Silva Bandy to call for further assistance. Will pass to day shift.    Event Summary: as above  MD Notified: 0705 Call [place], RN

## 2020-12-07 NOTE — Progress Notes (Signed)
  Chaplain On-Call responded to Rapid Response notification at 0657 hours.  The patient was not available for a visit because she was being stabilized by the medical team.  Chaplains are available for additional support as needed.  Chaplain Pollyann Samples M.Div., [facility]

## 2020-12-07 NOTE — Progress Notes (Signed)
GI Inpatient Follow-up Note  Subjective:  Patient seen and had some hematochezia this morning. EGD/Colonoscopies unrevealing yesterday.   Scheduled Inpatient Medications:   cyanocobalamin  1,000 mcg Subcutaneous Daily    Continuous Inpatient Infusions:    PRN Inpatient Medications:  acetaminophen **OR** acetaminophen, albuterol, hydrALAZINE, morphine injection, ondansetron **OR** ondansetron (ZOFRAN) IV  Review of Systems:  Review of Systems  Constitutional:  Negative for chills and fever.  Respiratory:  Negative for shortness of breath.   Cardiovascular:  Negative for chest pain.  Gastrointestinal:  Positive for blood in stool. Negative for abdominal pain and vomiting.  Musculoskeletal:  Negative for joint pain.  Skin:  Negative for rash.  Neurological:  Negative for focal weakness.  Psychiatric/Behavioral:  Negative for substance abuse.   All other systems reviewed and are negative.    Physical Examination: BP 130/78 (BP Location: Left Arm)   Pulse 88   Temp 98.8 F (37.1 C) (Oral)   Resp 20   Ht 5\' 5"  (1.651 m)   Wt 101.2 kg   LMP 03/18/2017 (Exact Date)   SpO2 100%   BMI 37.11 kg/m  Gen: NAD, alert and oriented x 4 HEENT: PEERLA, EOMI, Neck: supple, no JVD or thyromegaly Chest: No respiratory distress Abd: soft, non-tender, non-distended Ext: no edema, well perfused with 2+ pulses, Skin: no rash or lesions noted Lymph: no LAD  Data: Lab Results  Component Value Date   WBC 8.3 12/06/2020   HGB 6.8 (L) 12/07/2020   HCT 21.1 (L) 12/07/2020   MCV 88.2 12/06/2020   PLT 274 12/06/2020   Recent Labs  Lab 12/06/20 0633 12/06/20 1700 12/07/20 0756  HGB 9.0* 8.3* 6.8*   Lab Results  Component Value Date   NA 139 12/05/2020   K 3.7 12/05/2020   CL 109 12/05/2020   CO2 25 12/05/2020   BUN 16 12/05/2020   CREATININE 0.91 12/05/2020   Lab Results  Component Value Date   ALT 19 12/04/2020   AST 18 12/04/2020   GGT 10 01/04/2018   ALKPHOS 59  12/04/2020   BILITOT 0.8 12/04/2020   Recent Labs  Lab 12/04/20 2151  INR 1.0   Assessment/Plan: Ms. Blas is a 45 y.o. lady with hematochezia and negative EGD/Colonoscopy. Unclear cause but given negative EGD/Colon, small bowel bleeding on differential although would be unusual as usually would have melena instead of hematochezia. Anal outlet bleeding would be unusual given the degree of the drop in her hemoglobin. Rare causes would be a meckel diverticulum  Recommendations:  - maintain active type and screen - agree with tagged RBC scan - will also arrange for VCE tomorrow - ok to eat solid lunch but will need to be on clears after that and NPO at midnight - transfuse for hemoglobin < 7  Please call with questions or concerns. Dr. Marius Ditch resumes care tomorrow morning.  Raylene Miyamoto MD, MPH [place]

## 2020-12-07 NOTE — Progress Notes (Addendum)
Progress Note    Cassidy Mathis  GXQ:[id number] DOB: 24-Aug-1975  DOA: 12/04/2020 PCP: [facility] [place]      Brief Narrative:    Medical records reviewed and are as summarized below:  Cassidy Mathis is a 45 y.o. female with medical history significant for hypertension, NSAID use, who presented to the hospital with large-volume rectal bleeding (bright red blood per rectum).  She felt dizzy after she had multiple bloody stools.    She was admitted to the hospital for rectal bleeding complicated by acute blood loss anemia.  She was treated with IV fluids, IV Protonix.    Assessment/Plan:   Principal Problem:   Rectal bleeding Active Problems:   Tobacco use   Uncontrolled hypertension   Vitamin B12 deficiency   Acute blood loss anemia    Body mass index is 37.11 kg/m.  (Morbid obesity)  Acute rectal bleeding: S/p EGD and colonoscopy on 12/06/2020.  EGD was unremarkable and colonoscopy showed internal hemorrhoids.  She had significant rectal bleeding this morning. 1 L of normal saline bolus was ordered. Ordered tagged rbc scan for further evaluation.  Plan for capsule endoscopy tomorrow.  Acute blood loss anemia: Hemoglobin dropped again from 8.3-6.8.  Transfused 2 units of PRBCs for acute blood loss anemia.  S/p transfusion with 1 unit of PRBCs on 12/05/2020.    Vitamin B12 deficiency: Continue vitamin B12 injections  Hypertension: Antihypertensives (metoprolol, lisinopril, amlodipine) on hold.    Diet Order             Diet NPO time specified Except for: Sips with Meds  Diet effective now                      Consultants: Gastroenterologist  Procedures: EGD and colonoscopy    Medications:    cyanocobalamin  1,000 mcg Subcutaneous Daily   Continuous Infusions:     Anti-infectives (From admission, onward)    None              Family Communication/Anticipated D/C date and plan/Code Status   DVT  prophylaxis: SCDs Start: 12/04/20 2107     Code Status: Full Code  Family Communication: None Disposition Plan: Possible discharge to home tomorrow   Status is: Inpatient  Remains inpatient appropriate because: Rectal bleeding           Subjective:   Interval events noted.  She passed large amount of bright red blood stools this morning.  She felt lightheaded while sitting on the commode.  Objective:    Vitals:   12/07/20 0742 12/07/20 0746 12/07/20 0758 12/07/20 1130  BP: 129/75 123/78 120/66 130/78  Pulse: 79 84 83 88  Resp:   18 20  Temp:   98.2 F (36.8 C) 98.8 F (37.1 C)  TempSrc:   Oral Oral  SpO2: 100% 99% 100% 100%  Weight:      Height:       No data found.    Intake/Output Summary (Last 24 hours) at 12/07/2020 1412 Last data filed at 12/07/2020 0800 Gross per 24 hour  Intake 779.6 ml  Output --  Net 779.6 ml   Filed Weights   12/04/20 1850  Weight: 101.2 kg    Exam:   GEN: NAD SKIN: No rash EYES: EOMI ENT: MMM CV: RRR PULM: CTA B ABD: soft, ND, NT, +BS CNS: AAO x 3, non focal EXT: No edema or tenderness         Data  Reviewed:   I have personally reviewed following labs and imaging studies:  Labs: Labs show the following:   Basic Metabolic Panel: Recent Labs  Lab 12/04/20 1853 12/05/20 0612  NA 137 139  K 3.7 3.7  CL 105 109  CO2 25 25  GLUCOSE 103* 98  BUN 19 16  CREATININE 0.94 0.91  CALCIUM 8.9 8.0*   GFR Estimated Creatinine Clearance: 93 mL/min (by C-G formula based on SCr of 0.91 mg/dL). Liver Function Tests: Recent Labs  Lab 12/04/20 1853  AST 18  ALT 19  ALKPHOS 59  BILITOT 0.8  PROT 7.3  ALBUMIN 3.6   Recent Labs  Lab 12/04/20 1853  LIPASE 87*   No results for input(s): AMMONIA in the last 168 hours. Coagulation profile Recent Labs  Lab 12/04/20 2151  INR 1.0    CBC: Recent Labs  Lab 12/04/20 1853 12/04/20 2308 12/05/20 0612 12/05/20 1714 12/05/20 1853 12/06/20 0633  12/06/20 1700 12/07/20 0756  WBC 6.4  --  7.6 6.4  --  7.5 8.3  --   HGB 11.8*   < > 8.9* 8.9* 8.0* 9.0* 8.3* 6.8*  HCT 35.6*   < > 27.0* 26.8* 24.7* 26.6* 24.0* 21.1*  MCV 91.0  --  89.7 90.2  --  89.0 88.2  --   PLT 402*  --  307 346  --  295 274  --    < > = values in this interval not displayed.   Cardiac Enzymes: No results for input(s): CKTOTAL, CKMB, CKMBINDEX, TROPONINI in the last 168 hours. BNP (last 3 results) No results for input(s): PROBNP in the last 8760 hours. CBG: Recent Labs  Lab 12/05/20 1834 12/05/20 2038  GLUCAP 130* 103*   D-Dimer: No results for input(s): DDIMER in the last 72 hours. Hgb A1c: No results for input(s): HGBA1C in the last 72 hours. Lipid Profile: No results for input(s): CHOL, HDL, LDLCALC, TRIG, CHOLHDL, LDLDIRECT in the last 72 hours. Thyroid function studies: No results for input(s): TSH, T4TOTAL, T3FREE, THYROIDAB in the last 72 hours.  Invalid input(s): FREET3 Anemia work up: Recent Labs    12/05/20 1714  VITAMINB12 150*  FOLATE >24.8  FERRITIN 76  TIBC 351  IRON 147   Sepsis Labs: Recent Labs  Lab 12/05/20 0612 12/05/20 1714 12/06/20 0633 12/06/20 1700  WBC 7.6 6.4 7.5 8.3    Microbiology Recent Results (from the past 240 hour(s))  Resp Panel by RT-PCR (Flu A&B, Covid) Nasopharyngeal Swab     Status: None   Collection Time: 12/04/20  9:51 PM   Specimen: Nasopharyngeal Swab; Nasopharyngeal(NP) swabs in vial transport medium  Result Value Ref Range Status   SARS Coronavirus 2 by RT PCR NEGATIVE NEGATIVE Final    Comment: (NOTE) SARS-CoV-2 target nucleic acids are NOT DETECTED.  The SARS-CoV-2 RNA is generally detectable in upper respiratory specimens during the acute phase of infection. The lowest concentration of SARS-CoV-2 viral copies this assay can detect is 138 copies/mL. A negative result does not preclude SARS-Cov-2 infection and should not be used as the sole basis for treatment or other patient  management decisions. A negative result may occur with  improper specimen collection/handling, submission of specimen other than nasopharyngeal swab, presence of viral mutation(s) within the areas targeted by this assay, and inadequate number of viral copies(<138 copies/mL). A negative result must be combined with clinical observations, patient history, and epidemiological information. The expected result is Negative.  Fact Sheet for Patients:  EntrepreneurPulse.com.au  Fact Sheet for  Healthcare Providers:  IncredibleEmployment.be  This test is no t yet approved or cleared by the [place] and  has been authorized for detection and/or diagnosis of SARS-CoV-2 by FDA under an Emergency Use Authorization (EUA). This EUA will remain  in effect (meaning this test can be used) for the duration of the COVID-19 declaration under Section 564(b)(1) of the Act, 21 U.S.C.section 360bbb-3(b)(1), unless the authorization is terminated  or revoked sooner.       Influenza A by PCR NEGATIVE NEGATIVE Final   Influenza B by PCR NEGATIVE NEGATIVE Final    Comment: (NOTE) The Xpert Xpress SARS-CoV-2/FLU/RSV plus assay is intended as an aid in the diagnosis of influenza from Nasopharyngeal swab specimens and should not be used as a sole basis for treatment. Nasal washings and aspirates are unacceptable for Xpert Xpress SARS-CoV-2/FLU/RSV testing.  Fact Sheet for Patients: EntrepreneurPulse.com.au  Fact Sheet for Healthcare Providers: IncredibleEmployment.be  This test is not yet approved or cleared by the [place] FDA and has been authorized for detection and/or diagnosis of SARS-CoV-2 by FDA under an Emergency Use Authorization (EUA). This EUA will remain in effect (meaning this test can be used) for the duration of the COVID-19 declaration under Section 564(b)(1) of the Act, 21 U.S.C. section 360bbb-3(b)(1),  unless the authorization is terminated or revoked.  Performed at [facility], [address] [place], [place] [address]   MRSA Next Gen by PCR, Nasal     Status: None   Collection Time: 12/05/20  8:50 PM   Specimen: Nasal Mucosa; Nasal Swab  Result Value Ref Range Status   MRSA by PCR Next Gen NOT DETECTED NOT DETECTED Final    Comment: (NOTE) The GeneXpert MRSA Assay (FDA approved for NASAL specimens only), is one component of a comprehensive MRSA colonization surveillance program. It is not intended to diagnose MRSA infection nor to guide or monitor treatment for MRSA infections. Test performance is not FDA approved in patients less than 81 years old. Performed at [facility], [address] [place], [place] [address]     Procedures and diagnostic studies:  CT Angio Abd/Pel w/ and/or w/o  Result Date: 12/06/2020 CLINICAL DATA:  Bright red blood per rectum EXAM: CTA ABDOMEN AND PELVIS WITHOUT AND WITH CONTRAST TECHNIQUE: Multidetector CT imaging of the abdomen and pelvis was performed using the standard protocol during bolus administration of intravenous contrast. Multiplanar reconstructed images and MIPs were obtained and reviewed to evaluate the vascular anatomy. CONTRAST:  131mL OMNIPAQUE IOHEXOL 350 MG/ML SOLN COMPARISON:  CTA abdomen/pelvis dated 12/04/2020 FINDINGS: VASCULAR Aorta: Patent.  No evidence of aneurysm. Celiac: Patent. SMA: Patent. Renals: Patent bilaterally. IMA: Patent. Inflow: Patent bilaterally. Proximal Outflow: Patent bilaterally. Veins: Within normal limits. No evidence of active GI bleeding. Review of the MIP images confirms the above findings. NON-VASCULAR Lower chest: Lung bases are clear. Hepatobiliary: Liver is within normal limits. Status post cholecystectomy. No intrahepatic or extrahepatic duct dilatation. Pancreas: Within normal limits. Spleen: Within normal limits. Adrenals/Urinary Tract: Adrenal glands are within normal limits.  Probable subcentimeter cyst in the interpolar right kidney (series 12/image 32). Left kidney is within normal limits. No hydronephrosis. Bladder is mildly thick-walled but underdistended. Stomach/Bowel: Stomach is within normal limits. No evidence of bowel obstruction. Normal appendix (series 12/image 86). No colonic wall thickening or mass. Lymphatic: No suspicious abdominopelvic lymphadenopathy. Reproductive: Status post hysterectomy. No adnexal masses. Other: No abdominopelvic ascites. Musculoskeletal: Visualized osseous structures are within normal limits. IMPRESSION: No evidence of active GI bleeding.  No bowel wall thickening or mass is evident on CT. Electronically Signed   By: Julian Hy M.D.   On: 12/06/2020 03:22               LOS: 3 days   Marsden Zaino  [facility]   Pager on [email or web address]. If 7PM-7AM, please contact night-coverage at [email or web address]     12/07/2020, 2:12 PM

## 2020-12-08 ENCOUNTER — Encounter
Admission: EM | Disposition: A | Discharge status: Home / Self Care | Payer: Self-pay | Source: Home / Self Care | Attending: Internal Medicine

## 2020-12-08 ENCOUNTER — Encounter: Payer: Self-pay | Admitting: Gastroenterology

## 2020-12-08 DIAGNOSIS — K625 Hemorrhage of anus and rectum: Secondary | ICD-10-CM | POA: Diagnosis not present

## 2020-12-08 DIAGNOSIS — E538 Deficiency of other specified B group vitamins: Secondary | ICD-10-CM | POA: Diagnosis not present

## 2020-12-08 DIAGNOSIS — D62 Acute posthemorrhagic anemia: Secondary | ICD-10-CM | POA: Diagnosis not present

## 2020-12-08 HISTORY — PX: GIVENS CAPSULE STUDY: SHX5432

## 2020-12-08 LAB — BASIC METABOLIC PANEL
Anion gap: 7 (ref 5–15)
BUN: 8 mg/dL (ref 6–20)
CO2: 24 mmol/L (ref 22–32)
Calcium: 8.4 mg/dL — ABNORMAL LOW (ref 8.9–10.3)
Chloride: 108 mmol/L (ref 98–111)
Creatinine, Ser: 0.79 mg/dL (ref 0.44–1.00)
GFR, Estimated: >60 mL/min (ref >60)
Glucose, Bld: 101 mg/dL — ABNORMAL HIGH (ref 70–99)
Potassium: 3.6 mmol/L (ref 3.5–5.1)
Sodium: 139 mmol/L (ref 135–145)

## 2020-12-08 LAB — CBC WITH DIFFERENTIAL/PLATELET
Abs Immature Granulocytes: 0.03 10*3/uL (ref 0.00–0.07)
Basophils Absolute: 0 10*3/uL (ref 0.0–0.1)
Basophils Relative: 0 %
Eosinophils Absolute: 0.2 10*3/uL (ref 0.0–0.5)
Eosinophils Relative: 2 %
HCT: 26.5 % — ABNORMAL LOW (ref 36.0–46.0)
Hemoglobin: 9.4 g/dL — ABNORMAL LOW (ref 12.0–15.0)
Immature Granulocytes: 0 %
Lymphocytes Relative: 27 %
Lymphs Abs: 2.5 10*3/uL (ref 0.7–4.0)
MCH: 30.8 pg (ref 26.0–34.0)
MCHC: 35.5 g/dL (ref 30.0–36.0)
MCV: 86.9 fL (ref 80.0–100.0)
Monocytes Absolute: 0.7 10*3/uL (ref 0.1–1.0)
Monocytes Relative: 7 %
Neutro Abs: 6 10*3/uL (ref 1.7–7.7)
Neutrophils Relative %: 64 %
Platelets: 238 10*3/uL (ref 150–400)
RBC: 3.05 MIL/uL — ABNORMAL LOW (ref 3.87–5.11)
RDW: 13.5 % (ref 11.5–15.5)
WBC: 9.4 10*3/uL (ref 4.0–10.5)
nRBC: 0 % (ref 0.0–0.2)

## 2020-12-08 LAB — TYPE AND SCREEN
ABO/RH(D): A POS
Antibody Screen: NEGATIVE
Unit division: 0
Unit division: 0
Unit division: 0
Unit division: 0

## 2020-12-08 LAB — HEMOGLOBIN AND HEMATOCRIT, BLOOD
HCT: 26.6 % — ABNORMAL LOW (ref 36.0–46.0)
HCT: 25.4 % — ABNORMAL LOW (ref 36.0–46.0)
Hemoglobin: 9.3 g/dL — ABNORMAL LOW (ref 12.0–15.0)
Hemoglobin: 8.8 g/dL — ABNORMAL LOW (ref 12.0–15.0)

## 2020-12-08 LAB — BPAM RBC
Blood Product Expiration Date: 202212052359
Blood Product Expiration Date: 202212052359
Blood Product Expiration Date: 202212072359
Blood Product Expiration Date: 202212082359
ISSUE DATE / TIME: 202211112211
ISSUE DATE / TIME: 202211131647
ISSUE DATE / TIME: 202211131947
Unit Type and Rh: 6200
Unit Type and Rh: 6200
Unit Type and Rh: 6200
Unit Type and Rh: 6200

## 2020-12-08 LAB — PHOSPHORUS: Phosphorus: 3.4 mg/dL (ref 2.5–4.6)

## 2020-12-08 LAB — PREPARE RBC (CROSSMATCH)

## 2020-12-08 LAB — MAGNESIUM: Magnesium: 2.1 mg/dL (ref 1.7–2.4)

## 2020-12-08 SURGERY — IMAGING PROCEDURE, GI TRACT, INTRALUMINAL, VIA CAPSULE

## 2020-12-08 MED ORDER — DEXTROSE IN LACTATED RINGERS 5 % IV SOLN: INTRAVENOUS | Status: AC

## 2020-12-08 MED ORDER — SODIUM CHLORIDE 0.9 % IV SOLN
300.0000 mg | Freq: Once | INTRAVENOUS | Status: AC
  Administered 2020-12-08: 300 mg via INTRAVENOUS
  Filled 2020-12-08: qty 300

## 2020-12-08 NOTE — Progress Notes (Signed)
Progress Note    Cassidy Mathis  [name] DOB: [birth year]  DOA: 12/04/2020 PCP: [facility] [place]      Brief Narrative:    Medical records reviewed and are as summarized below:  Cassidy Mathis is a 45 y.o. female with medical history significant for hypertension, NSAID use, who presented to the hospital with large-volume rectal bleeding (bright red blood per rectum).  She felt dizzy after she had multiple bloody stools.    She was admitted to the hospital for rectal bleeding complicated by acute blood loss anemia.  She was treated with IV fluids, IV Protonix.    Assessment/Plan:   Principal Problem:   Rectal bleeding Active Problems:   Tobacco use   Uncontrolled hypertension   Vitamin B12 deficiency   Acute blood loss anemia    Body mass index is 37.11 kg/m.  (Morbid obesity)  Acute rectal bleeding: S/p EGD and colonoscopy on 12/06/2020.  EGD was unremarkable and colonoscopy showed internal hemorrhoids.  Tagged RBC scan did not show any evidence of active bleeding.  Video capsule endoscopy is ongoing today.  Follow-up with gastroenterologist for further recommendations.  Acute blood loss anemia: H&H is stable.  S/p transfusion with 2 units of PRBCs on 12/07/2020.  S/p transfusion with 1 unit of PRBCs on 12/05/2020.    Vitamin B12 deficiency: Continue vitamin B12 injections  Hypertension: Antihypertensives (metoprolol, lisinopril, amlodipine) on hold.    Diet Order             Diet NPO time specified Except for: Sips with Meds  Diet effective midnight                      Consultants: Gastroenterologist  Procedures: EGD and colonoscopy    Medications:    cyanocobalamin  1,000 mcg Subcutaneous Daily   Continuous Infusions:  dextrose 5% lactated ringers 75 mL/hr at 12/08/20 0844      Anti-infectives (From admission, onward)    None              Family Communication/Anticipated D/C date and  plan/Code Status   DVT prophylaxis: SCDs Start: 12/04/20 2107     Code Status: Full Code  Family Communication: None Disposition Plan: Possible discharge to home tomorrow   Status is: Inpatient  Remains inpatient appropriate because: Rectal bleeding           Subjective:   She had rectal bleeding this morning.  No lightheadedness, vomiting or abdominal pain.  Her mother was at the bedside.  Objective:    Vitals:   12/07/20 2254 12/08/20 0550 12/08/20 0700 12/08/20 1100  BP: (!) 127/59 (!) 150/90 (!) 146/84 (!) 148/86  Pulse: 88 84 76 78  Resp: 18 16 18    Temp: 98.7 F (37.1 C) 98.6 F (37 C) 99.1 F (37.3 C) 99.5 F (37.5 C)  TempSrc: Oral  Oral Oral  SpO2: 100% 98% 100% 100%  Weight:      Height:       No data found.     Intake/Output Summary (Last 24 hours) at 12/08/2020 1308 Last data filed at 12/07/2020 2249 Gross per 24 hour  Intake 674 ml  Output --  Net 674 ml   Filed Weights   12/04/20 1850  Weight: 101.2 kg    Exam:  GEN: NAD SKIN: No rash EYES: EOMI ENT: MMM CV: RRR PULM: CTA B ABD: soft, obese, NT, +BS CNS: AAO x 3, non focal EXT: No edema  or tenderness          Data Reviewed:   I have personally reviewed following labs and imaging studies:  Labs: Labs show the following:   Basic Metabolic Panel: Recent Labs  Lab 12/04/20 1853 12/05/20 0612 12/08/20 0246  NA 137 139 139  K 3.7 3.7 3.6  CL 105 109 108  CO2 25 25 24   GLUCOSE 103* 98 101*  BUN 19 16 8   CREATININE 0.94 0.91 0.79  CALCIUM 8.9 8.0* 8.4*  MG  --   --  2.1  PHOS  --   --  3.4   GFR Estimated Creatinine Clearance: 105.8 mL/min (by C-G formula based on SCr of 0.79 mg/dL). Liver Function Tests: Recent Labs  Lab 12/04/20 1853  AST 18  ALT 19  ALKPHOS 59  BILITOT 0.8  PROT 7.3  ALBUMIN 3.6   Recent Labs  Lab 12/04/20 1853  LIPASE 87*   No results for input(s): AMMONIA in the last 168 hours. Coagulation profile Recent Labs  Lab  12/04/20 2151  INR 1.0    CBC: Recent Labs  Lab 12/05/20 0612 12/05/20 1714 12/05/20 1853 12/06/20 0633 12/06/20 1700 12/07/20 0756 12/08/20 0246  WBC 7.6 6.4  --  7.5 8.3  --  9.4  NEUTROABS  --   --   --   --   --   --  6.0  HGB 8.9* 8.9* 8.0* 9.0* 8.3* 6.8* 9.4*  HCT 27.0* 26.8* 24.7* 26.6* 24.0* 21.1* 26.5*  MCV 89.7 90.2  --  89.0 88.2  --  86.9  PLT 307 346  --  295 274  --  238   Cardiac Enzymes: No results for input(s): CKTOTAL, CKMB, CKMBINDEX, TROPONINI in the last 168 hours. BNP (last 3 results) No results for input(s): PROBNP in the last 8760 hours. CBG: Recent Labs  Lab 12/05/20 1834 12/05/20 2038  GLUCAP 130* 103*   D-Dimer: No results for input(s): DDIMER in the last 72 hours. Hgb A1c: No results for input(s): HGBA1C in the last 72 hours. Lipid Profile: No results for input(s): CHOL, HDL, LDLCALC, TRIG, CHOLHDL, LDLDIRECT in the last 72 hours. Thyroid function studies: No results for input(s): TSH, T4TOTAL, T3FREE, THYROIDAB in the last 72 hours.  Invalid input(s): FREET3 Anemia work up: Recent Labs    12/05/20 1714  VITAMINB12 150*  FOLATE >24.8  FERRITIN 76  TIBC 351  IRON 147   Sepsis Labs: Recent Labs  Lab 12/05/20 1714 12/06/20 0633 12/06/20 1700 12/08/20 0246  WBC 6.4 7.5 8.3 9.4    Microbiology Recent Results (from the past 240 hour(s))  Resp Panel by RT-PCR (Flu A&B, Covid) Nasopharyngeal Swab     Status: None   Collection Time: 12/04/20  9:51 PM   Specimen: Nasopharyngeal Swab; Nasopharyngeal(NP) swabs in vial transport medium  Result Value Ref Range Status   SARS Coronavirus 2 by RT PCR NEGATIVE NEGATIVE Final    Comment: (NOTE) SARS-CoV-2 target nucleic acids are NOT DETECTED.  The SARS-CoV-2 RNA is generally detectable in upper respiratory specimens during the acute phase of infection. The lowest concentration of SARS-CoV-2 viral copies this assay can detect is 138 copies/mL. A negative result does not preclude  SARS-Cov-2 infection and should not be used as the sole basis for treatment or other patient management decisions. A negative result may occur with  improper specimen collection/handling, submission of specimen other than nasopharyngeal swab, presence of viral mutation(s) within the areas targeted by this assay, and inadequate number of viral copies(<138  copies/mL). A negative result must be combined with clinical observations, patient history, and epidemiological information. The expected result is Negative.  Fact Sheet for Patients:  EntrepreneurPulse.com.au  Fact Sheet for Healthcare Providers:  IncredibleEmployment.be  This test is no t yet approved or cleared by the [place] FDA and  has been authorized for detection and/or diagnosis of SARS-CoV-2 by FDA under an Emergency Use Authorization (EUA). This EUA will remain  in effect (meaning this test can be used) for the duration of the COVID-19 declaration under Section 564(b)(1) of the Act, 21 U.S.C.section 360bbb-3(b)(1), unless the authorization is terminated  or revoked sooner.       Influenza A by PCR NEGATIVE NEGATIVE Final   Influenza B by PCR NEGATIVE NEGATIVE Final    Comment: (NOTE) The Xpert Xpress SARS-CoV-2/FLU/RSV plus assay is intended as an aid in the diagnosis of influenza from Nasopharyngeal swab specimens and should not be used as a sole basis for treatment. Nasal washings and aspirates are unacceptable for Xpert Xpress SARS-CoV-2/FLU/RSV testing.  Fact Sheet for Patients: EntrepreneurPulse.com.au  Fact Sheet for Healthcare Providers: IncredibleEmployment.be  This test is not yet approved or cleared by the [place] FDA and has been authorized for detection and/or diagnosis of SARS-CoV-2 by FDA under an Emergency Use Authorization (EUA). This EUA will remain in effect (meaning this test can be used) for the duration of  the COVID-19 declaration under Section 564(b)(1) of the Act, 21 U.S.C. section 360bbb-3(b)(1), unless the authorization is terminated or revoked.  Performed at [facility], [address] [place], [place] [address]   MRSA Next Gen by PCR, Nasal     Status: None   Collection Time: 12/05/20  8:50 PM   Specimen: Nasal Mucosa; Nasal Swab  Result Value Ref Range Status   MRSA by PCR Next Gen NOT DETECTED NOT DETECTED Final    Comment: (NOTE) The GeneXpert MRSA Assay (FDA approved for NASAL specimens only), is one component of a comprehensive MRSA colonization surveillance program. It is not intended to diagnose MRSA infection nor to guide or monitor treatment for MRSA infections. Test performance is not FDA approved in patients less than 92 years old. Performed at [facility], [address] [place], [place] [address]     Procedures and diagnostic studies:  NM GI Blood Loss  Result Date: 12/07/2020 CLINICAL DATA:  GI bleed EXAM: NUCLEAR MEDICINE GASTROINTESTINAL BLEEDING SCAN TECHNIQUE: Sequential abdominal images were obtained following intravenous administration of Tc-70m labeled red blood cells. RADIOPHARMACEUTICALS:  22.59 mCi Tc-48m pertechnetate in-vitro labeled red cells. COMPARISON:  None. FINDINGS: No abnormal foci of tracer activity. Progressive rounded pelvic uptake which does not change location, likely physiologic bladder activity. IMPRESSION: No evidence of active GI bleed. Electronically Signed   By: Yetta Glassman M.D.   On: 12/07/2020 17:18               LOS: 4 days   Amauris Debois  [facility]   Pager on [email or web address]. If 7PM-7AM, please contact night-coverage at [email or web address]     12/08/2020, 1:08 PM

## 2020-12-09 ENCOUNTER — Encounter: Payer: Self-pay | Admitting: Gastroenterology

## 2020-12-09 ENCOUNTER — Other Ambulatory Visit: Payer: Self-pay

## 2020-12-09 DIAGNOSIS — D62 Acute posthemorrhagic anemia: Secondary | ICD-10-CM | POA: Diagnosis not present

## 2020-12-09 DIAGNOSIS — K625 Hemorrhage of anus and rectum: Secondary | ICD-10-CM | POA: Diagnosis not present

## 2020-12-09 DIAGNOSIS — E538 Deficiency of other specified B group vitamins: Secondary | ICD-10-CM | POA: Diagnosis not present

## 2020-12-09 LAB — CBC WITH DIFFERENTIAL/PLATELET
Abs Immature Granulocytes: 0.06 10*3/uL (ref 0.00–0.07)
Basophils Absolute: 0 10*3/uL (ref 0.0–0.1)
Basophils Relative: 0 %
Eosinophils Absolute: 0.2 10*3/uL (ref 0.0–0.5)
Eosinophils Relative: 2 %
HCT: 23.1 % — ABNORMAL LOW (ref 36.0–46.0)
Hemoglobin: 8.2 g/dL — ABNORMAL LOW (ref 12.0–15.0)
Immature Granulocytes: 1 %
Lymphocytes Relative: 19 %
Lymphs Abs: 2.2 10*3/uL (ref 0.7–4.0)
MCH: 30.5 pg (ref 26.0–34.0)
MCHC: 35.5 g/dL (ref 30.0–36.0)
MCV: 85.9 fL (ref 80.0–100.0)
Monocytes Absolute: 0.7 10*3/uL (ref 0.1–1.0)
Monocytes Relative: 6 %
Neutro Abs: 7.9 10*3/uL — ABNORMAL HIGH (ref 1.7–7.7)
Neutrophils Relative %: 72 %
Platelets: 283 10*3/uL (ref 150–400)
RBC: 2.69 MIL/uL — ABNORMAL LOW (ref 3.87–5.11)
RDW: 13.9 % (ref 11.5–15.5)
WBC: 11.1 10*3/uL — ABNORMAL HIGH (ref 4.0–10.5)
nRBC: 0 % (ref 0.0–0.2)

## 2020-12-09 MED ORDER — BOOST / RESOURCE BREEZE PO LIQD CUSTOM
1.0000 | Freq: Three times a day (TID) | ORAL | Status: DC
  Administered 2020-12-09 – 2020-12-10 (×2): 1 via ORAL

## 2020-12-09 MED ORDER — HYDROXYZINE HCL 25 MG PO TABS
25.0000 mg | ORAL_TABLET | Freq: Once | ORAL | Status: AC
  Administered 2020-12-09: 25 mg via ORAL
  Filled 2020-12-09: qty 1

## 2020-12-09 MED ORDER — SODIUM CHLORIDE 0.9 % IV SOLN
300.0000 mg | Freq: Once | INTRAVENOUS | Status: AC
  Administered 2020-12-09: 19:00:00 300 mg via INTRAVENOUS
  Filled 2020-12-09: qty 300

## 2020-12-09 MED ORDER — VITAMIN B-12 1000 MCG PO TABS
1000.0000 ug | ORAL_TABLET | Freq: Every day | ORAL | Status: DC
  Administered 2020-12-10 – 2020-12-13 (×3): 1000 ug via ORAL
  Filled 2020-12-09 (×4): qty 1

## 2020-12-09 NOTE — Progress Notes (Signed)
NM Bowel Meckels will be done tomorrow and not today according [facility] staff. MD made aware.

## 2020-12-09 NOTE — Progress Notes (Signed)
Cassidy Darby, MD [address]  [place]  [place], [place] [address]  Main: [phone]  Fax: [phone] Pager: [phone]   Subjective: Patient reports that she had bloody bowel movement last night.  She is on clear liquid diet.  She denies any other GI symptoms.   Objective: Vital signs in last 24 hours: Vitals:   12/08/20 2352 12/09/20 0605 12/09/20 0838 12/09/20 1232  BP: 117/75 106/71 110/63 129/75  Pulse: 98 98 (!) 103 (!) 108  Resp: 18 16 18 18   Temp: 98.4 F (36.9 C) 98.8 F (37.1 C) 99.3 F (37.4 C) 99.4 F (37.4 C)  TempSrc: Oral  Oral Oral  SpO2: 100% 100% 98% 100%  Weight:      Height:       Weight change:   Intake/Output Summary (Last 24 hours) at 12/09/2020 1442 Last data filed at 12/09/2020 0538 Gross per 24 hour  Intake 766.8 ml  Output 300 ml  Net 466.8 ml     Exam: Heart:: Regular rate and rhythm, S1S2 present, or without murmur or extra heart sounds Lungs: normal and clear to auscultation Abdomen: soft, nontender, normal bowel sounds   Lab Results: CBC Latest Ref Rng & Units 12/09/2020 12/08/2020 12/08/2020  WBC 4.0 - 10.5 K/uL 11.1(H) - -  Hemoglobin 12.0 - 15.0 g/dL 8.2(L) 8.8(L) 9.3(L)  Hematocrit 36.0 - 46.0 % 23.1(L) 25.4(L) 26.6(L)  Platelets 150 - 400 K/uL 283 - -   CMP Latest Ref Rng & Units 12/08/2020 12/05/2020 12/04/2020  Glucose 70 - 99 mg/dL 101(H) 98 103(H)  BUN 6 - 20 mg/dL 8 16 19   Creatinine 0.44 - 1.00 mg/dL 0.79 0.91 0.94  Sodium 135 - 145 mmol/L 139 139 137  Potassium 3.5 - 5.1 mmol/L 3.6 3.7 3.7  Chloride 98 - 111 mmol/L 108 109 105  CO2 22 - 32 mmol/L 24 25 25   Calcium 8.9 - 10.3 mg/dL 8.4(L) 8.0(L) 8.9  Total Protein 6.5 - 8.1 g/dL - - 7.3  Total Bilirubin 0.3 - 1.2 mg/dL - - 0.8  Alkaline Phos 38 - 126 U/L - - 59  AST 15 - 41 U/L - - 18  ALT 0 - 44 U/L - - 19    Micro Results: Recent Results (from the past 240 hour(s))  Resp Panel by RT-PCR (Flu A&B, Covid) Nasopharyngeal Swab      Status: None   Collection Time: 12/04/20  9:51 PM   Specimen: Nasopharyngeal Swab; Nasopharyngeal(NP) swabs in vial transport medium  Result Value Ref Range Status   SARS Coronavirus 2 by RT PCR NEGATIVE NEGATIVE Final    Comment: (NOTE) SARS-CoV-2 target nucleic acids are NOT DETECTED.  The SARS-CoV-2 RNA is generally detectable in upper respiratory specimens during the acute phase of infection. The lowest concentration of SARS-CoV-2 viral copies this assay can detect is 138 copies/mL. A negative result does not preclude SARS-Cov-2 infection and should not be used as the sole basis for treatment or other patient management decisions. A negative result may occur with  improper specimen collection/handling, submission of specimen other than nasopharyngeal swab, presence of viral mutation(s) within the areas targeted by this assay, and inadequate number of viral copies(<138 copies/mL). A negative result must be combined with clinical observations, patient history, and epidemiological information. The expected result is Negative.  Fact Sheet for Patients:  EntrepreneurPulse.com.au  Fact Sheet for Healthcare Providers:  IncredibleEmployment.be  This test is no t yet approved or cleared by the [place] FDA and  has  been authorized for detection and/or diagnosis of SARS-CoV-2 by FDA under an Emergency Use Authorization (EUA). This EUA will remain  in effect (meaning this test can be used) for the duration of the COVID-19 declaration under Section 564(b)(1) of the Act, 21 U.S.C.section 360bbb-3(b)(1), unless the authorization is terminated  or revoked sooner.       Influenza A by PCR NEGATIVE NEGATIVE Final   Influenza B by PCR NEGATIVE NEGATIVE Final    Comment: (NOTE) The Xpert Xpress SARS-CoV-2/FLU/RSV plus assay is intended as an aid in the diagnosis of influenza from Nasopharyngeal swab specimens and should not be used as a sole basis  for treatment. Nasal washings and aspirates are unacceptable for Xpert Xpress SARS-CoV-2/FLU/RSV testing.  Fact Sheet for Patients: EntrepreneurPulse.com.au  Fact Sheet for Healthcare Providers: IncredibleEmployment.be  This test is not yet approved or cleared by the [place] FDA and has been authorized for detection and/or diagnosis of SARS-CoV-2 by FDA under an Emergency Use Authorization (EUA). This EUA will remain in effect (meaning this test can be used) for the duration of the COVID-19 declaration under Section 564(b)(1) of the Act, 21 U.S.C. section 360bbb-3(b)(1), unless the authorization is terminated or revoked.  Performed at [facility], [address] [place], [place] [address]   MRSA Next Gen by PCR, Nasal     Status: None   Collection Time: 12/05/20  8:50 PM   Specimen: Nasal Mucosa; Nasal Swab  Result Value Ref Range Status   MRSA by PCR Next Gen NOT DETECTED NOT DETECTED Final    Comment: (NOTE) The GeneXpert MRSA Assay (FDA approved for NASAL specimens only), is one component of a comprehensive MRSA colonization surveillance program. It is not intended to diagnose MRSA infection nor to guide or monitor treatment for MRSA infections. Test performance is not FDA approved in patients less than 45 years old. Performed at [facility], [address] [place], [place] [address]    Studies/Results: NM GI Blood Loss  Result Date: 12/07/2020 CLINICAL DATA:  GI bleed EXAM: NUCLEAR MEDICINE GASTROINTESTINAL BLEEDING SCAN TECHNIQUE: Sequential abdominal images were obtained following intravenous administration of Tc-17m labeled red blood cells. RADIOPHARMACEUTICALS:  22.59 mCi Tc-70m pertechnetate in-vitro labeled red cells. COMPARISON:  None. FINDINGS: No abnormal foci of tracer activity. Progressive rounded pelvic uptake which does not change location, likely physiologic bladder activity. IMPRESSION: No  evidence of active GI bleed. Electronically Signed   By: Yetta Glassman M.D.   On: 12/07/2020 17:18   Medications: I have reviewed the patient's current medications. Prior to Admission:  Medications Prior to Admission  Medication Sig Dispense Refill Last Dose   albuterol (PROVENTIL HFA;VENTOLIN HFA) 108 (90 Base) MCG/ACT inhaler Inhale 2 puffs into the lungs every 6 (six) hours as needed for wheezing or shortness of breath. 1 Inhaler 2 prn at prn   amLODipine (NORVASC) 5 MG tablet Take 1 tablet (5 mg total) by mouth daily. 30 tablet 1 12/03/2020 at 1130   aspirin EC 81 MG tablet Take 1 tablet (81 mg total) by mouth daily. Swallow whole.   12/03/2020 at 1130   gabapentin (NEURONTIN) 300 MG capsule Take 300 mg by mouth at bedtime.   Past Week at 2330   lisinopril (ZESTRIL) 40 MG tablet Take 1 tablet (40 mg total) by mouth every morning. 30 tablet 1 12/03/2020 at 1130   metoprolol succinate (TOPROL-XL) 25 MG 24 hr tablet Take 25 mg by mouth daily.   12/03/2020 at 1130   naproxen (NAPROSYN) 500 MG  tablet Take 1 tablet (500 mg total) by mouth 2 (two) times daily with a meal. 20 tablet 00 12/02/2020 at 2330   metoprolol succinate (TOPROL-XL) 50 MG 24 hr tablet Take 50 mg by mouth daily. (Patient not taking: Reported on 12/04/2020)   Not Taking   metoprolol tartrate (LOPRESSOR) 50 MG tablet Take 1 tablet (50 mg total) by mouth once for 1 dose. Take TWO hours prior to CT procedure 1 tablet 0    Scheduled:  feeding supplement  1 Container Oral TID BM   vitamin B-12  1,000 mcg Oral Daily   Continuous:  iron sucrose     EHM:[id number] **OR** acetaminophen, albuterol, hydrALAZINE, morphine injection, ondansetron **OR** ondansetron (ZOFRAN) IV Anti-infectives (From admission, onward)    None      Scheduled Meds:  feeding supplement  1 Container Oral TID BM   vitamin B-12  1,000 mcg Oral Daily   Continuous Infusions:  iron sucrose     PRN Meds:.acetaminophen **OR** acetaminophen, albuterol,  hydrALAZINE, morphine injection, ondansetron **OR** ondansetron (ZOFRAN) IV   Assessment: Principal Problem:   Rectal bleeding Active Problems:   Tobacco use   Uncontrolled hypertension   Vitamin B12 deficiency   Acute blood loss anemia  Hematochezia, acute blood loss anemia: Work-up thus far 12/04/2020: CT angio abdomen and pelvis no evidence of active GI bleed 12/06/2020: CT angio abdomen and pelvis no evidence of active GI bleed 12/06/2020: EGD and colonoscopy unremarkable 12/07/2020: Nuclear medicine tagged RBC scan negative 12/07/2020: Video capsule endoscopy revealed blood in the entire colon, no active bleeding source identified in the small intestine   VCE report: Study is complete Capsule retained in stomach for 5 hours, but reached cecum Small bowel transit time 2hrs No active bleeding source identified in the small bowel Blood in entire colon beginning in cecum  Plan: Stat Meckel's scan, If positive consult general surgery. If negative recommend repeat colonoscopy or vascular surgery consult Okay to advance to full liquid diet and protein supplements IV iron Monitor CBC every 8-12 hours GI will follow along with you Discussed my recommendations with patient and she is agreeable    LOS: 5 days   Cassidy Mathis 12/09/2020, 2:42 PM

## 2020-12-09 NOTE — Progress Notes (Addendum)
Progress Note    [name]  VHQ:[id number] DOB: 10-Feb-1975  DOA: 12/04/2020 PCP: [facility]      Brief Narrative:    Medical records reviewed and are as summarized below:  Cassidy Mathis is a 45 y.o. female with medical history significant for hypertension, NSAID use, who presented to the hospital with large-volume rectal bleeding (bright red blood per rectum).  She felt dizzy after she had multiple bloody stools.    She was admitted to the hospital for rectal bleeding complicated by acute blood loss anemia.  She was treated with IV fluids, IV Protonix and was transfused with 3 units of packed red blood cells.  She was treated with IV fluids, IV Protonix.  EGD, colonoscopy, RBC tagged scan and video capsule endoscopy failed to reveal the etiology of rectal bleeding.    Assessment/Plan:   Principal Problem:   Rectal bleeding Active Problems:   Tobacco use   Uncontrolled hypertension   Vitamin B12 deficiency   Acute blood loss anemia    Body mass index is 37.11 kg/m.  (Morbid obesity)  Acute rectal bleeding: S/p EGD and colonoscopy on 12/06/2020.  EGD was unremarkable and colonoscopy showed internal hemorrhoids.  Tagged RBC scan did not show any evidence of active bleeding.  Video capsule endoscopy showed blood in the entire colon beginning from the cecum.  Dr. Marius Ditch, gastroenterologist.  Meckel scan has been ordered for further evaluation.  Follow-up with gastroenterologist for further recommendations.   Acute blood loss anemia: H&H is stable.  S/p transfusion with 2 units of PRBCs on 12/07/2020.  S/p transfusion with 1 unit of PRBCs on 12/05/2020.  Continue to monitor H&H and transfuse as needed.  Vitamin B12 deficiency: Continue vitamin B12 injections  Hypertension: Antihypertensives (metoprolol, lisinopril, amlodipine) on hold.    Diet Order             Diet clear liquid Room service appropriate? Yes; Fluid consistency:  Thin  Diet effective now                      Consultants: Gastroenterologist  Procedures: EGD, colonoscopy and video capsule endoscopy    Medications:    vitamin B-12  1,000 mcg Oral Daily   Continuous Infusions:  iron sucrose        Anti-infectives (From admission, onward)    None              Family Communication/Anticipated D/C date and plan/Code Status   DVT prophylaxis: SCDs Start: 12/04/20 2107     Code Status: Full Code  Family Communication: None Disposition Plan: Possible discharge to home home in 2 to 3 days   Status is: Inpatient  Remains inpatient appropriate because: Rectal bleeding           Subjective:   Interval events noted.  She had rectal bleeding last night.  No dizziness, shortness of breath, vomiting or abdominal pain.  Objective:    Vitals:   12/08/20 2352 12/09/20 0605 12/09/20 0838 12/09/20 1232  BP: 117/75 106/71 110/63 129/75  Pulse: 98 98 (!) 103 (!) 108  Resp: 18 16 18 18   Temp: 98.4 F (36.9 C) 98.8 F (37.1 C) 99.3 F (37.4 C) 99.4 F (37.4 C)  TempSrc: Oral  Oral Oral  SpO2: 100% 100% 98% 100%  Weight:      Height:       No data found.     Intake/Output Summary (Last 24 hours)  at 12/09/2020 1343 Last data filed at 12/09/2020 0538 Gross per 24 hour  Intake 766.8 ml  Output 300 ml  Net 466.8 ml   Filed Weights   12/04/20 1850  Weight: 101.2 kg    Exam:  GEN: NAD SKIN: No rash EYES: EOMI ENT: MMM CV: RRR PULM: CTA B ABD: soft, obese, NT, +BS CNS: AAO x 3, non focal EXT: No edema or tenderness           Data Reviewed:   I have personally reviewed following labs and imaging studies:  Labs: Labs show the following:   Basic Metabolic Panel: Recent Labs  Lab 12/04/20 1853 12/05/20 0612 12/08/20 0246  NA 137 139 139  K 3.7 3.7 3.6  CL 105 109 108  CO2 25 25 24   GLUCOSE 103* 98 101*  BUN 19 16 8   CREATININE 0.94 0.91 0.79  CALCIUM 8.9 8.0* 8.4*  MG   --   --  2.1  PHOS  --   --  3.4   GFR Estimated Creatinine Clearance: 105.8 mL/min (by C-G formula based on SCr of 0.79 mg/dL). Liver Function Tests: Recent Labs  Lab 12/04/20 1853  AST 18  ALT 19  ALKPHOS 59  BILITOT 0.8  PROT 7.3  ALBUMIN 3.6   Recent Labs  Lab 12/04/20 1853  LIPASE 87*   No results for input(s): AMMONIA in the last 168 hours. Coagulation profile Recent Labs  Lab 12/04/20 2151  INR 1.0    CBC: Recent Labs  Lab 12/05/20 1714 12/05/20 1853 12/06/20 0633 12/06/20 1700 12/07/20 0756 12/08/20 0246 12/08/20 1526 12/08/20 2155 12/09/20 0632  WBC 6.4  --  7.5 8.3  --  9.4  --   --  11.1*  NEUTROABS  --   --   --   --   --  6.0  --   --  7.9*  HGB 8.9*   < > 9.0* 8.3* 6.8* 9.4* 9.3* 8.8* 8.2*  HCT 26.8*   < > 26.6* 24.0* 21.1* 26.5* 26.6* 25.4* 23.1*  MCV 90.2  --  89.0 88.2  --  86.9  --   --  85.9  PLT 346  --  295 274  --  238  --   --  283   < > = values in this interval not displayed.   Cardiac Enzymes: No results for input(s): CKTOTAL, CKMB, CKMBINDEX, TROPONINI in the last 168 hours. BNP (last 3 results) No results for input(s): PROBNP in the last 8760 hours. CBG: Recent Labs  Lab 12/05/20 1834 12/05/20 2038  GLUCAP 130* 103*   D-Dimer: No results for input(s): DDIMER in the last 72 hours. Hgb A1c: No results for input(s): HGBA1C in the last 72 hours. Lipid Profile: No results for input(s): CHOL, HDL, LDLCALC, TRIG, CHOLHDL, LDLDIRECT in the last 72 hours. Thyroid function studies: No results for input(s): TSH, T4TOTAL, T3FREE, THYROIDAB in the last 72 hours.  Invalid input(s): FREET3 Anemia work up: No results for input(s): VITAMINB12, FOLATE, FERRITIN, TIBC, IRON, RETICCTPCT in the last 72 hours.  Sepsis Labs: Recent Labs  Lab 12/06/20 0633 12/06/20 1700 12/08/20 0246 12/09/20 0632  WBC 7.5 8.3 9.4 11.1*    Microbiology Recent Results (from the past 240 hour(s))  Resp Panel by RT-PCR (Flu A&B, Covid)  Nasopharyngeal Swab     Status: None   Collection Time: 12/04/20  9:51 PM   Specimen: Nasopharyngeal Swab; Nasopharyngeal(NP) swabs in vial transport medium  Result Value Ref Range Status  SARS Coronavirus 2 by RT PCR NEGATIVE NEGATIVE Final    Comment: (NOTE) SARS-CoV-2 target nucleic acids are NOT DETECTED.  The SARS-CoV-2 RNA is generally detectable in upper respiratory specimens during the acute phase of infection. The lowest concentration of SARS-CoV-2 viral copies this assay can detect is 138 copies/mL. A negative result does not preclude SARS-Cov-2 infection and should not be used as the sole basis for treatment or other patient management decisions. A negative result may occur with  improper specimen collection/handling, submission of specimen other than nasopharyngeal swab, presence of viral mutation(s) within the areas targeted by this assay, and inadequate number of viral copies(<138 copies/mL). A negative result must be combined with clinical observations, patient history, and epidemiological information. The expected result is Negative.  Fact Sheet for Patients:  EntrepreneurPulse.com.au  Fact Sheet for Healthcare Providers:  IncredibleEmployment.be  This test is no t yet approved or cleared by the [place] FDA and  has been authorized for detection and/or diagnosis of SARS-CoV-2 by FDA under an Emergency Use Authorization (EUA). This EUA will remain  in effect (meaning this test can be used) for the duration of the COVID-19 declaration under Section 564(b)(1) of the Act, 21 U.S.C.section 360bbb-3(b)(1), unless the authorization is terminated  or revoked sooner.       Influenza A by PCR NEGATIVE NEGATIVE Final   Influenza B by PCR NEGATIVE NEGATIVE Final    Comment: (NOTE) The Xpert Xpress SARS-CoV-2/FLU/RSV plus assay is intended as an aid in the diagnosis of influenza from Nasopharyngeal swab specimens and should not be  used as a sole basis for treatment. Nasal washings and aspirates are unacceptable for Xpert Xpress SARS-CoV-2/FLU/RSV testing.  Fact Sheet for Patients: EntrepreneurPulse.com.au  Fact Sheet for Healthcare Providers: IncredibleEmployment.be  This test is not yet approved or cleared by the [place] FDA and has been authorized for detection and/or diagnosis of SARS-CoV-2 by FDA under an Emergency Use Authorization (EUA). This EUA will remain in effect (meaning this test can be used) for the duration of the COVID-19 declaration under Section 564(b)(1) of the Act, 21 U.S.C. section 360bbb-3(b)(1), unless the authorization is terminated or revoked.  Performed at [facility], [address] [place], [place] [address]   MRSA Next Gen by PCR, Nasal     Status: None   Collection Time: 12/05/20  8:50 PM   Specimen: Nasal Mucosa; Nasal Swab  Result Value Ref Range Status   MRSA by PCR Next Gen NOT DETECTED NOT DETECTED Final    Comment: (NOTE) The GeneXpert MRSA Assay (FDA approved for NASAL specimens only), is one component of a comprehensive MRSA colonization surveillance program. It is not intended to diagnose MRSA infection nor to guide or monitor treatment for MRSA infections. Test performance is not FDA approved in patients less than 80 years old. Performed at [facility], [address] [place], [place] [address]     Procedures and diagnostic studies:  NM GI Blood Loss  Result Date: 12/07/2020 CLINICAL DATA:  GI bleed EXAM: NUCLEAR MEDICINE GASTROINTESTINAL BLEEDING SCAN TECHNIQUE: Sequential abdominal images were obtained following intravenous administration of Tc-3m labeled red blood cells. RADIOPHARMACEUTICALS:  22.59 mCi Tc-6m pertechnetate in-vitro labeled red cells. COMPARISON:  None. FINDINGS: No abnormal foci of tracer activity. Progressive rounded pelvic uptake which does not change location, likely  physiologic bladder activity. IMPRESSION: No evidence of active GI bleed. Electronically Signed   By: Yetta Glassman M.D.   On: 12/07/2020 17:18  LOS: 5 days   Idora Brosious  [facility]   Pager on [email or web address]. If 7PM-7AM, please contact night-coverage at [email or web address]     12/09/2020, 1:43 PM

## 2020-12-10 ENCOUNTER — Inpatient Hospital Stay: Payer: Medicaid Other

## 2020-12-10 ENCOUNTER — Encounter: Payer: Self-pay | Admitting: Internal Medicine

## 2020-12-10 LAB — CBC
HCT: 25 % — ABNORMAL LOW (ref 36.0–46.0)
Hemoglobin: 8.6 g/dL — ABNORMAL LOW (ref 12.0–15.0)
MCH: 30.6 pg (ref 26.0–34.0)
MCHC: 34.4 g/dL (ref 30.0–36.0)
MCV: 89 fL (ref 80.0–100.0)
Platelets: 380 10*3/uL (ref 150–400)
RBC: 2.81 MIL/uL — ABNORMAL LOW (ref 3.87–5.11)
RDW: 14.1 % (ref 11.5–15.5)
WBC: 12.8 10*3/uL — ABNORMAL HIGH (ref 4.0–10.5)
nRBC: 0 % (ref 0.0–0.2)

## 2020-12-10 LAB — IRON AND TIBC
Iron: 79 ug/dL (ref 28–170)
Saturation Ratios: 23 % (ref 10.4–31.8)
TIBC: 337 ug/dL (ref 250–450)
UIBC: 258 ug/dL

## 2020-12-10 LAB — VITAMIN B12: Vitamin B-12: 1812 pg/mL — ABNORMAL HIGH (ref 180–914)

## 2020-12-10 MED ORDER — SODIUM PERTECHNETATE TC 99M INJECTION
10.0000 | Freq: Once | INTRAVENOUS | Status: AC | PRN
  Administered 2020-12-10: 10.9 via INTRAVENOUS

## 2020-12-10 MED ORDER — ENSURE ENLIVE PO LIQD
237.0000 mL | Freq: Two times a day (BID) | ORAL | Status: DC
Start: 2020-12-10 — End: 2020-12-13
  Administered 2020-12-10: 237 mL via ORAL

## 2020-12-10 MED ORDER — IOHEXOL 300 MG/ML  SOLN
100.0000 mL | Freq: Once | INTRAMUSCULAR | Status: AC | PRN
  Administered 2020-12-10: 15:00:00 100 mL via INTRAVENOUS

## 2020-12-10 MED ORDER — BUTALBITAL-APAP-CAFFEINE 50-325-40 MG PO TABS
1.0000 | ORAL_TABLET | Freq: Once | ORAL | Status: AC
  Administered 2020-12-10: 1 via ORAL
  Filled 2020-12-10: qty 1

## 2020-12-10 NOTE — Progress Notes (Signed)
PROGRESS NOTE    Cassidy Mathis  DZH:[id number] DOB: Aug 20, 1975 DOA: 12/04/2020 PCP: [facility] [place]    Brief Narrative:  Cassidy Mathis is a 45 y.o. female with medical history significant for hypertension, NSAID use, who presented to the hospital with large-volume rectal bleeding (bright red blood per rectum).  She felt dizzy after she had multiple bloody stools.     She was admitted to the hospital for rectal bleeding complicated by acute blood loss anemia.  She was treated with IV fluids, IV Protonix and was transfused with 3 units of packed red blood cells.  She was treated with IV fluids, IV Protonix.  EGD, colonoscopy, RBC tagged scan and video capsule endoscopy failed to reveal the etiology of rectal bleeding.  Meckel scan performed on 11/16 did not show any Meckel diverticulosis.   Assessment & Plan:   Principal Problem:   Rectal bleeding Active Problems:   Tobacco use   Uncontrolled hypertension   Vitamin B12 deficiency   Acute blood loss anemia  Acute blood loss anemia secondary to rectal bleeding. Rectal bleeding. B12 deficiency. Patient required a total of 3 units PRBC transfusion since admission.  She is also receiving B12 injection for B12 deficiency. Her hemoglobin has been stabilized.  She no longer has any rectal bleeding. So far work-up has not identified any source of bleeding, but bladder was seen in the colon at colonoscopy. Discussed with Dr. Marius Ditch, will perform CT enterogram with contrast.  If this is also negative, she will be discharged home tomorrow if hemoglobin still stable.  Essential hypertension. Blood pressure medicine on hold.    DVT prophylaxis: SCDs Code Status: full Family Communication:  Disposition Plan:    Status is: Inpatient  Remains inpatient appropriate because: Severity of disease and continued work-up.        I/O last 3 completed shifts: In: 380.4 [P.O.:237; IV Piggyback:143.4] Out: 300  [Urine:300] No intake/output data recorded.     Consultants:  GI  Procedures:   Antimicrobials: None  Subjective: Patient doing well today.  She no longer has any bleeding today.  She showed me her last stool, looks yellow, no additional bleeding. She denies any abdominal pain or nausea vomiting. No short of breath or cough. No fever or chills.  Objective: Vitals:   12/09/20 2021 12/10/20 0011 12/10/20 0503 12/10/20 0741  BP: 131/80 (!) 127/92 101/67 113/75  Pulse: 100 (!) 106 91 89  Resp: 16 19 17 18   Temp: 98.1 F (36.7 C) 99.1 F (37.3 C) 98.1 F (36.7 C) 98.6 F (37 C)  TempSrc: Oral Oral Oral Oral  SpO2: 100% 100% 100% 100%  Weight:      Height:        Intake/Output Summary (Last 24 hours) at 12/10/2020 1501 Last data filed at 12/10/2020 0400 Gross per 24 hour  Intake 380.4 ml  Output --  Net 380.4 ml   Filed Weights   12/04/20 1850  Weight: 101.2 kg    Examination:  General exam: Appears calm and comfortable  Respiratory system: Clear to auscultation. Respiratory effort normal. Cardiovascular system: S1 & S2 heard, RRR. No JVD, murmurs, rubs, gallops or clicks. No pedal edema. Gastrointestinal system: Abdomen is nondistended, soft and nontender. No organomegaly or masses felt. Normal bowel sounds heard. Central nervous system: Alert and oriented. No focal neurological deficits. Extremities: Symmetric 5 x 5 power. Skin: No rashes, lesions or ulcers Psychiatry: Judgement and insight appear normal. Mood & affect appropriate.  Data Reviewed: I have personally reviewed following labs and imaging studies  CBC: Recent Labs  Lab 12/06/20 0633 12/06/20 1700 12/07/20 0756 12/08/20 0246 12/08/20 1526 12/08/20 2155 12/09/20 0632 12/10/20 1137  WBC 7.5 8.3  --  9.4  --   --  11.1* 12.8*  NEUTROABS  --   --   --  6.0  --   --  7.9*  --   HGB 9.0* 8.3*   < > 9.4* 9.3* 8.8* 8.2* 8.6*  HCT 26.6* 24.0*   < > 26.5* 26.6* 25.4* 23.1* 25.0*  MCV 89.0  88.2  --  86.9  --   --  85.9 89.0  PLT 295 274  --  238  --   --  283 380   < > = values in this interval not displayed.   Basic Metabolic Panel: Recent Labs  Lab 12/04/20 1853 12/05/20 0612 12/08/20 0246  NA 137 139 139  K 3.7 3.7 3.6  CL 105 109 108  CO2 25 25 24   GLUCOSE 103* 98 101*  BUN 19 16 8   CREATININE 0.94 0.91 0.79  CALCIUM 8.9 8.0* 8.4*  MG  --   --  2.1  PHOS  --   --  3.4   GFR: Estimated Creatinine Clearance: 105.8 mL/min (by C-G formula based on SCr of 0.79 mg/dL). Liver Function Tests: Recent Labs  Lab 12/04/20 1853  AST 18  ALT 19  ALKPHOS 59  BILITOT 0.8  PROT 7.3  ALBUMIN 3.6   Recent Labs  Lab 12/04/20 1853  LIPASE 87*   No results for input(s): AMMONIA in the last 168 hours. Coagulation Profile: Recent Labs  Lab 12/04/20 2151  INR 1.0   Cardiac Enzymes: No results for input(s): CKTOTAL, CKMB, CKMBINDEX, TROPONINI in the last 168 hours. BNP (last 3 results) No results for input(s): PROBNP in the last 8760 hours. HbA1C: No results for input(s): HGBA1C in the last 72 hours. CBG: Recent Labs  Lab 12/05/20 1834 12/05/20 2038  GLUCAP 130* 103*   Lipid Profile: No results for input(s): CHOL, HDL, LDLCALC, TRIG, CHOLHDL, LDLDIRECT in the last 72 hours. Thyroid Function Tests: No results for input(s): TSH, T4TOTAL, FREET4, T3FREE, THYROIDAB in the last 72 hours. Anemia Panel: Recent Labs    12/10/20 1334  TIBC 337  IRON 79   Sepsis Labs: No results for input(s): PROCALCITON, LATICACIDVEN in the last 168 hours.  Recent Results (from the past 240 hour(s))  Resp Panel by RT-PCR (Flu A&B, Covid) Nasopharyngeal Swab     Status: None   Collection Time: 12/04/20  9:51 PM   Specimen: Nasopharyngeal Swab; Nasopharyngeal(NP) swabs in vial transport medium  Result Value Ref Range Status   SARS Coronavirus 2 by RT PCR NEGATIVE NEGATIVE Final    Comment: (NOTE) SARS-CoV-2 target nucleic acids are NOT DETECTED.  The SARS-CoV-2 RNA is  generally detectable in upper respiratory specimens during the acute phase of infection. The lowest concentration of SARS-CoV-2 viral copies this assay can detect is 138 copies/mL. A negative result does not preclude SARS-Cov-2 infection and should not be used as the sole basis for treatment or other patient management decisions. A negative result may occur with  improper specimen collection/handling, submission of specimen other than nasopharyngeal swab, presence of viral mutation(s) within the areas targeted by this assay, and inadequate number of viral copies(<138 copies/mL). A negative result must be combined with clinical observations, patient history, and epidemiological information. The expected result is Negative.  Fact Sheet for Patients:  EntrepreneurPulse.com.au  Fact Sheet for Healthcare Providers:  IncredibleEmployment.be  This test is no t yet approved or cleared by the [place] FDA and  has been authorized for detection and/or diagnosis of SARS-CoV-2 by FDA under an Emergency Use Authorization (EUA). This EUA will remain  in effect (meaning this test can be used) for the duration of the COVID-19 declaration under Section 564(b)(1) of the Act, 21 U.S.C.section 360bbb-3(b)(1), unless the authorization is terminated  or revoked sooner.       Influenza A by PCR NEGATIVE NEGATIVE Final   Influenza B by PCR NEGATIVE NEGATIVE Final    Comment: (NOTE) The Xpert Xpress SARS-CoV-2/FLU/RSV plus assay is intended as an aid in the diagnosis of influenza from Nasopharyngeal swab specimens and should not be used as a sole basis for treatment. Nasal washings and aspirates are unacceptable for Xpert Xpress SARS-CoV-2/FLU/RSV testing.  Fact Sheet for Patients: EntrepreneurPulse.com.au  Fact Sheet for Healthcare Providers: IncredibleEmployment.be  This test is not yet approved or cleared by the [place] FDA and has been authorized for detection and/or diagnosis of SARS-CoV-2 by FDA under an Emergency Use Authorization (EUA). This EUA will remain in effect (meaning this test can be used) for the duration of the COVID-19 declaration under Section 564(b)(1) of the Act, 21 U.S.C. section 360bbb-3(b)(1), unless the authorization is terminated or revoked.  Performed at [facility], [address] [place], [place] [address]   MRSA Next Gen by PCR, Nasal     Status: None   Collection Time: 12/05/20  8:50 PM   Specimen: Nasal Mucosa; Nasal Swab  Result Value Ref Range Status   MRSA by PCR Next Gen NOT DETECTED NOT DETECTED Final    Comment: (NOTE) The GeneXpert MRSA Assay (FDA approved for NASAL specimens only), is one component of a comprehensive MRSA colonization surveillance program. It is not intended to diagnose MRSA infection nor to guide or monitor treatment for MRSA infections. Test performance is not FDA approved in patients less than 70 years old. Performed at [facility], [address], [place], [place] [address]          Radiology Studies: [place] Bowel Img Meckels  Result Date: 12/10/2020 CLINICAL DATA:  Lower gastrointestinal bleed. Concern for Meckel's diverticulum. EXAM: NUCLEAR MEDICINE BOWEL (MECKEL'S) SCAN TECHNIQUE: Sequential abdominal images were obtained following intravenous injection of radiopharmaceutical. RADIOPHARMACEUTICALS:  10.9 mCi Tc-81m pertechnetate IV COMPARISON:  CT 11/26/2020, GI bleed scan 12/07/2020 FINDINGS: No accumulation of radiotracer to localize ectopic gastric mucosa (Meckel's diverticulum). Physiologic activity noted within the stomach and leaching into the proximal small bowel over time. IMPRESSION: No scintigraphic evidence of ectopic gastric mucosa (Meckel's diverticulum). Electronically Signed   By: Suzy Bouchard M.D.   On: 12/10/2020 09:29   US Venous Img Upper Uni Right(DVT)  Result Date:  12/10/2020 CLINICAL DATA:  Right upper extremity pain and edema following peripheral IV removal. EXAM: RIGHT UPPER EXTREMITY VENOUS DOPPLER ULTRASOUND TECHNIQUE: Gray-scale sonography with graded compression, as well as color Doppler and duplex ultrasound were performed to evaluate the upper extremity deep venous system from the level of the subclavian vein and including the jugular, axillary, basilic, radial, ulnar and upper cephalic vein. Spectral Doppler was utilized to evaluate flow at rest and with distal augmentation maneuvers. COMPARISON:  None. FINDINGS: Contralateral Subclavian Vein: Respiratory phasicity is normal and symmetric with the symptomatic side. No evidence of thrombus. Normal compressibility. Internal Jugular Vein: No evidence of thrombus. Normal compressibility, respiratory phasicity and response to augmentation. Subclavian  Vein: No evidence of thrombus. Normal compressibility, respiratory phasicity and response to augmentation. Axillary Vein: No evidence of thrombus. Normal compressibility, respiratory phasicity and response to augmentation. Cephalic Vein: There is hypoechoic occlusive thrombus involving the cephalic vein at the level of the forearm to the antecubital fossa, reportedly at the location of recently removed peripheral IV (images 32 two hundred thirty-four). Basilic Vein: No evidence of thrombus. Normal compressibility, respiratory phasicity and response to augmentation. Brachial Veins: No evidence of thrombus. Normal compressibility, respiratory phasicity and response to augmentation. Radial Veins: No evidence of thrombus. Normal compressibility, respiratory phasicity and response to augmentation. Ulnar Veins: No evidence of thrombus. Normal compressibility, respiratory phasicity and response to augmentation. Other Findings:  None visualized. IMPRESSION: 1. No evidence of DVT within the right upper extremity. 2. Examination is positive for short-segment occlusive SVT involving  the cephalic vein at the level of the forearm and antecubital fossa, reportedly at the location of recently removed peripheral IV. Again, there is no extension of this peripheral SVT to the deep venous system of the right upper extremity. Electronically Signed   By: Sandi Mariscal M.D.   On: 12/10/2020 11:24        Scheduled Meds:  feeding supplement  237 mL Oral BID BM   vitamin B-12  1,000 mcg Oral Daily   Continuous Infusions:   LOS: 6 days    Time spent: 26 minutes    Sharen Hones, MD [facility]   To contact the attending provider between 7A-7P or the covering provider during after hours 7P-7A, please log into the web site [email or web address] and access using universal [place] password for that web site. If you do not have the password, please call the hospital operator.  12/10/2020, 3:01 PM

## 2020-12-10 NOTE — Progress Notes (Signed)
Cassidy Darby, MD [address]  [place]  [place], [place] [address]  Main: [phone]  Fax: [phone] Pager: [phone]   Subjective: Patient reports that she had a bowel movement yesterday afternoon which was old blood.  She is frustrated and feels depressed that we could not figure out the source of bleeding.  Patient is tearful and does not feel like talking to her family members.  Objective: Vital signs in last 24 hours: Vitals:   12/10/20 0011 12/10/20 0503 12/10/20 0741 12/10/20 1610  BP: (!) 127/92 101/67 113/75 124/84  Pulse: (!) 106 91 89 (!) 101  Resp: 19 17 18 18   Temp: 99.1 F (37.3 C) 98.1 F (36.7 C) 98.6 F (37 C) 98.5 F (36.9 C)  TempSrc: Oral Oral Oral Oral  SpO2: 100% 100% 100% 100%  Weight:      Height:       Weight change:   Intake/Output Summary (Last 24 hours) at 12/10/2020 1644 Last data filed at 12/10/2020 0400 Gross per 24 hour  Intake 380.4 ml  Output --  Net 380.4 ml     Exam: Heart:: Regular rate and rhythm, S1S2 present, or without murmur or extra heart sounds Lungs: normal and clear to auscultation Abdomen: soft, nontender, normal bowel sounds   Lab Results: CBC Latest Ref Rng & Units 12/10/2020 12/09/2020 12/08/2020  WBC 4.0 - 10.5 K/uL 12.8(H) 11.1(H) -  Hemoglobin 12.0 - 15.0 g/dL 8.6(L) 8.2(L) 8.8(L)  Hematocrit 36.0 - 46.0 % 25.0(L) 23.1(L) 25.4(L)  Platelets 150 - 400 K/uL 380 283 -   CMP Latest Ref Rng & Units 12/08/2020 12/05/2020 12/04/2020  Glucose 70 - 99 mg/dL 101(H) 98 103(H)  BUN 6 - 20 mg/dL 8 16 19   Creatinine 0.44 - 1.00 mg/dL 0.79 0.91 0.94  Sodium 135 - 145 mmol/L 139 139 137  Potassium 3.5 - 5.1 mmol/L 3.6 3.7 3.7  Chloride 98 - 111 mmol/L 108 109 105  CO2 22 - 32 mmol/L 24 25 25   Calcium 8.9 - 10.3 mg/dL 8.4(L) 8.0(L) 8.9  Total Protein 6.5 - 8.1 g/dL - - 7.3  Total Bilirubin 0.3 - 1.2 mg/dL - - 0.8  Alkaline Phos 38 - 126 U/L - - 59  AST 15 - 41 U/L - - 18  ALT 0 - 44 U/L - - 19     Micro Results: Recent Results (from the past 240 hour(s))  Resp Panel by RT-PCR (Flu A&B, Covid) Nasopharyngeal Swab     Status: None   Collection Time: 12/04/20  9:51 PM   Specimen: Nasopharyngeal Swab; Nasopharyngeal(NP) swabs in vial transport medium  Result Value Ref Range Status   SARS Coronavirus 2 by RT PCR NEGATIVE NEGATIVE Final    Comment: (NOTE) SARS-CoV-2 target nucleic acids are NOT DETECTED.  The SARS-CoV-2 RNA is generally detectable in upper respiratory specimens during the acute phase of infection. The lowest concentration of SARS-CoV-2 viral copies this assay can detect is 138 copies/mL. A negative result does not preclude SARS-Cov-2 infection and should not be used as the sole basis for treatment or other patient management decisions. A negative result may occur with  improper specimen collection/handling, submission of specimen other than nasopharyngeal swab, presence of viral mutation(s) within the areas targeted by this assay, and inadequate number of viral copies(<138 copies/mL). A negative result must be combined with clinical observations, patient history, and epidemiological information. The expected result is Negative.  Fact Sheet for Patients:  EntrepreneurPulse.com.au  Fact Sheet for Healthcare Providers:  IncredibleEmployment.be  This test is no t yet approved or cleared by the [place] and  has been authorized for detection and/or diagnosis of SARS-CoV-2 by FDA under an Emergency Use Authorization (EUA). This EUA will remain  in effect (meaning this test can be used) for the duration of the COVID-19 declaration under Section 564(b)(1) of the Act, 21 U.S.C.section 360bbb-3(b)(1), unless the authorization is terminated  or revoked sooner.       Influenza A by PCR NEGATIVE NEGATIVE Final   Influenza B by PCR NEGATIVE NEGATIVE Final    Comment: (NOTE) The Xpert Xpress SARS-CoV-2/FLU/RSV plus assay  is intended as an aid in the diagnosis of influenza from Nasopharyngeal swab specimens and should not be used as a sole basis for treatment. Nasal washings and aspirates are unacceptable for Xpert Xpress SARS-CoV-2/FLU/RSV testing.  Fact Sheet for Patients: EntrepreneurPulse.com.au  Fact Sheet for Healthcare Providers: IncredibleEmployment.be  This test is not yet approved or cleared by the [place] FDA and has been authorized for detection and/or diagnosis of SARS-CoV-2 by FDA under an Emergency Use Authorization (EUA). This EUA will remain in effect (meaning this test can be used) for the duration of the COVID-19 declaration under Section 564(b)(1) of the Act, 21 U.S.C. section 360bbb-3(b)(1), unless the authorization is terminated or revoked.  Performed at [facility], [address] [place], [place] [address]   MRSA Next Gen by PCR, Nasal     Status: None   Collection Time: 12/05/20  8:50 PM   Specimen: Nasal Mucosa; Nasal Swab  Result Value Ref Range Status   MRSA by PCR Next Gen NOT DETECTED NOT DETECTED Final    Comment: (NOTE) The GeneXpert MRSA Assay (FDA approved for NASAL specimens only), is one component of a comprehensive MRSA colonization surveillance program. It is not intended to diagnose MRSA infection nor to guide or monitor treatment for MRSA infections. Test performance is not FDA approved in patients less than 65 years old. Performed at [facility], [address] [place], [place] [address]    Studies/Results: [place] Bowel Img Meckels  Result Date: 12/10/2020 CLINICAL DATA:  Lower gastrointestinal bleed. Concern for Meckel's diverticulum. EXAM: NUCLEAR MEDICINE BOWEL (MECKEL'S) SCAN TECHNIQUE: Sequential abdominal images were obtained following intravenous injection of radiopharmaceutical. RADIOPHARMACEUTICALS:  10.9 mCi Tc-67m pertechnetate IV COMPARISON:  CT 11/26/2020, GI bleed scan  12/07/2020 FINDINGS: No accumulation of radiotracer to localize ectopic gastric mucosa (Meckel's diverticulum). Physiologic activity noted within the stomach and leaching into the proximal small bowel over time. IMPRESSION: No scintigraphic evidence of ectopic gastric mucosa (Meckel's diverticulum). Electronically Signed   By: Suzy Bouchard M.D.   On: 12/10/2020 09:29   US Venous Img Upper Uni Right(DVT)  Result Date: 12/10/2020 CLINICAL DATA:  Right upper extremity pain and edema following peripheral IV removal. EXAM: RIGHT UPPER EXTREMITY VENOUS DOPPLER ULTRASOUND TECHNIQUE: Gray-scale sonography with graded compression, as well as color Doppler and duplex ultrasound were performed to evaluate the upper extremity deep venous system from the level of the subclavian vein and including the jugular, axillary, basilic, radial, ulnar and upper cephalic vein. Spectral Doppler was utilized to evaluate flow at rest and with distal augmentation maneuvers. COMPARISON:  None. FINDINGS: Contralateral Subclavian Vein: Respiratory phasicity is normal and symmetric with the symptomatic side. No evidence of thrombus. Normal compressibility. Internal Jugular Vein: No evidence of thrombus. Normal compressibility, respiratory phasicity and response to augmentation. Subclavian Vein: No evidence of thrombus. Normal compressibility, respiratory phasicity and response to augmentation. Axillary Vein:  No evidence of thrombus. Normal compressibility, respiratory phasicity and response to augmentation. Cephalic Vein: There is hypoechoic occlusive thrombus involving the cephalic vein at the level of the forearm to the antecubital fossa, reportedly at the location of recently removed peripheral IV (images 32 two hundred thirty-four). Basilic Vein: No evidence of thrombus. Normal compressibility, respiratory phasicity and response to augmentation. Brachial Veins: No evidence of thrombus. Normal compressibility, respiratory phasicity and  response to augmentation. Radial Veins: No evidence of thrombus. Normal compressibility, respiratory phasicity and response to augmentation. Ulnar Veins: No evidence of thrombus. Normal compressibility, respiratory phasicity and response to augmentation. Other Findings:  None visualized. IMPRESSION: 1. No evidence of DVT within the right upper extremity. 2. Examination is positive for short-segment occlusive SVT involving the cephalic vein at the level of the forearm and antecubital fossa, reportedly at the location of recently removed peripheral IV. Again, there is no extension of this peripheral SVT to the deep venous system of the right upper extremity. Electronically Signed   By: Sandi Mariscal M.D.   On: 12/10/2020 11:24   Medications: I have reviewed the patient's current medications. Prior to Admission:  Medications Prior to Admission  Medication Sig Dispense Refill Last Dose   albuterol (PROVENTIL HFA;VENTOLIN HFA) 108 (90 Base) MCG/ACT inhaler Inhale 2 puffs into the lungs every 6 (six) hours as needed for wheezing or shortness of breath. 1 Inhaler 2 prn at prn   amLODipine (NORVASC) 5 MG tablet Take 1 tablet (5 mg total) by mouth daily. 30 tablet 1 12/03/2020 at 1130   aspirin EC 81 MG tablet Take 1 tablet (81 mg total) by mouth daily. Swallow whole.   12/03/2020 at 1130   gabapentin (NEURONTIN) 300 MG capsule Take 300 mg by mouth at bedtime.   Past Week at 2330   lisinopril (ZESTRIL) 40 MG tablet Take 1 tablet (40 mg total) by mouth every morning. 30 tablet 1 12/03/2020 at 1130   metoprolol succinate (TOPROL-XL) 25 MG 24 hr tablet Take 25 mg by mouth daily.   12/03/2020 at 1130   naproxen (NAPROSYN) 500 MG tablet Take 1 tablet (500 mg total) by mouth 2 (two) times daily with a meal. 20 tablet 00 12/02/2020 at 2330   metoprolol succinate (TOPROL-XL) 50 MG 24 hr tablet Take 50 mg by mouth daily. (Patient not taking: Reported on 12/04/2020)   Not Taking   metoprolol tartrate (LOPRESSOR) 50 MG tablet  Take 1 tablet (50 mg total) by mouth once for 1 dose. Take TWO hours prior to CT procedure 1 tablet 0    Scheduled:  feeding supplement  237 mL Oral BID BM   vitamin B-12  1,000 mcg Oral Daily   Continuous:   HAL:[id number] **OR** acetaminophen, albuterol, hydrALAZINE, ondansetron **OR** ondansetron (ZOFRAN) IV Anti-infectives (From admission, onward)    None      Scheduled Meds:  feeding supplement  237 mL Oral BID BM   vitamin B-12  1,000 mcg Oral Daily   Continuous Infusions:   PRN Meds:.acetaminophen **OR** acetaminophen, albuterol, hydrALAZINE, ondansetron **OR** ondansetron (ZOFRAN) IV   Assessment: Principal Problem:   Rectal bleeding Active Problems:   Tobacco use   Uncontrolled hypertension   Vitamin B12 deficiency   Acute blood loss anemia  Hematochezia, acute blood loss anemia: Work-up thus far 12/04/2020: CT angio abdomen and pelvis no evidence of active GI bleed 12/06/2020: CT angio abdomen and pelvis no evidence of active GI bleed 12/06/2020: EGD and colonoscopy unremarkable 12/07/2020: Nuclear medicine tagged RBC scan negative 12/07/2020:  Video capsule endoscopy revealed blood in the entire colon, no active bleeding source identified in the small intestine  12/10/2020: Meckel's scan negative  VCE report: Study is complete Capsule retained in stomach for 5 hours, but reached cecum Small bowel transit time 2hrs No active bleeding source identified in the small bowel Blood in entire colon beginning in cecum  Plan: Overt obscure GI bleed: The bleed is currently localized to either distal small bowel or proximal colon based on video capsule endoscopy Recommend CT enterography to evaluate for any submucosal small bowel lesions I have discussed with patient in length that we are trying our best to locate the source of bleeding and provided reassurance and to be patient with [place].  Her hemoglobin today is 8.6, improved from 8.2 yesterday.  She is likely  passing old blood.  Based on CT enterography results and if patient is no longer bleeding, she can go home and follow-up with me in 2 weeks as outpatient Patient is agreeable   LOS: 6 days   Bertrand Vowels 12/10/2020, 4:44 PM

## 2020-12-11 ENCOUNTER — Encounter
Admission: EM | Disposition: A | Discharge status: Home / Self Care | Payer: Self-pay | Source: Home / Self Care | Attending: Internal Medicine

## 2020-12-11 ENCOUNTER — Other Ambulatory Visit (INDEPENDENT_AMBULATORY_CARE_PROVIDER_SITE_OTHER): Payer: Self-pay | Admitting: Vascular Surgery

## 2020-12-11 DIAGNOSIS — K921 Melena: Principal | ICD-10-CM

## 2020-12-11 DIAGNOSIS — K922 Gastrointestinal hemorrhage, unspecified: Secondary | ICD-10-CM

## 2020-12-11 DIAGNOSIS — E876 Hypokalemia: Secondary | ICD-10-CM

## 2020-12-11 DIAGNOSIS — D500 Iron deficiency anemia secondary to blood loss (chronic): Secondary | ICD-10-CM

## 2020-12-11 HISTORY — PX: VISCERAL ANGIOGRAPHY: CATH118276

## 2020-12-11 LAB — CBC WITH DIFFERENTIAL/PLATELET
Abs Immature Granulocytes: 0.08 10*3/uL — ABNORMAL HIGH (ref 0.00–0.07)
Basophils Absolute: 0 10*3/uL (ref 0.0–0.1)
Basophils Relative: 0 %
Eosinophils Absolute: 0.2 10*3/uL (ref 0.0–0.5)
Eosinophils Relative: 2 %
HCT: 20.4 % — ABNORMAL LOW (ref 36.0–46.0)
Hemoglobin: 7 g/dL — ABNORMAL LOW (ref 12.0–15.0)
Immature Granulocytes: 1 %
Lymphocytes Relative: 25 %
Lymphs Abs: 2.6 10*3/uL (ref 0.7–4.0)
MCH: 30.4 pg (ref 26.0–34.0)
MCHC: 34.3 g/dL (ref 30.0–36.0)
MCV: 88.7 fL (ref 80.0–100.0)
Monocytes Absolute: 0.9 10*3/uL (ref 0.1–1.0)
Monocytes Relative: 8 %
Neutro Abs: 6.8 10*3/uL (ref 1.7–7.7)
Neutrophils Relative %: 64 %
Platelets: 324 10*3/uL (ref 150–400)
RBC: 2.3 MIL/uL — ABNORMAL LOW (ref 3.87–5.11)
RDW: 14 % (ref 11.5–15.5)
WBC: 10.7 10*3/uL — ABNORMAL HIGH (ref 4.0–10.5)
nRBC: 0 % (ref 0.0–0.2)

## 2020-12-11 LAB — BASIC METABOLIC PANEL
Anion gap: 5 (ref 5–15)
BUN: 13 mg/dL (ref 6–20)
CO2: 25 mmol/L (ref 22–32)
Calcium: 8.3 mg/dL — ABNORMAL LOW (ref 8.9–10.3)
Chloride: 108 mmol/L (ref 98–111)
Creatinine, Ser: 0.97 mg/dL (ref 0.44–1.00)
GFR, Estimated: >60 mL/min (ref >60)
Glucose, Bld: 98 mg/dL (ref 70–99)
Potassium: 3.3 mmol/L — ABNORMAL LOW (ref 3.5–5.1)
Sodium: 138 mmol/L (ref 135–145)

## 2020-12-11 LAB — HEMOGLOBIN
Hemoglobin: 7.7 g/dL — ABNORMAL LOW (ref 12.0–15.0)
Hemoglobin: 7.1 g/dL — ABNORMAL LOW (ref 12.0–15.0)

## 2020-12-11 SURGERY — VISCERAL ANGIOGRAPHY
Anesthesia: Moderate Sedation

## 2020-12-11 MED ORDER — FENTANYL CITRATE (PF) 100 MCG/2ML IJ SOLN
INTRAMUSCULAR | Status: DC | PRN
  Administered 2020-12-11: 50 ug via INTRAVENOUS
  Administered 2020-12-11: 25 ug via INTRAVENOUS

## 2020-12-11 MED ORDER — SODIUM CHLORIDE 0.9 % IV SOLN: INTRAVENOUS | Status: DC

## 2020-12-11 MED ORDER — FAMOTIDINE 20 MG PO TABS: 40.0000 mg | ORAL_TABLET | Freq: Once | ORAL | Status: DC | PRN

## 2020-12-11 MED ORDER — MIDAZOLAM HCL 2 MG/ML PO SYRP
ORAL_SOLUTION | ORAL | Status: AC
  Filled 2020-12-11: qty 4

## 2020-12-11 MED ORDER — FENTANYL CITRATE PF 50 MCG/ML IJ SOSY
PREFILLED_SYRINGE | INTRAMUSCULAR | Status: AC
  Filled 2020-12-11: qty 1

## 2020-12-11 MED ORDER — MORPHINE SULFATE (PF) 2 MG/ML IV SOLN
2.0000 mg | Freq: Once | INTRAVENOUS | Status: AC
  Administered 2020-12-11: 23:00:00 2 mg via INTRAVENOUS
  Filled 2020-12-11: qty 1

## 2020-12-11 MED ORDER — SODIUM CHLORIDE 0.9 % IV SOLN: INTRAVENOUS | Status: DC | PRN

## 2020-12-11 MED ORDER — POTASSIUM CHLORIDE 10 MEQ/100ML IV SOLN
10.0000 meq | INTRAVENOUS | Status: AC
  Filled 2020-12-11: qty 100

## 2020-12-11 MED ORDER — ONDANSETRON HCL 4 MG/2ML IJ SOLN
4.0000 mg | Freq: Four times a day (QID) | INTRAMUSCULAR | Status: DC | PRN
  Administered 2020-12-11: 21:00:00 4 mg via INTRAVENOUS

## 2020-12-11 MED ORDER — PROTAMINE SULFATE 10 MG/ML IV SOLN
50.0000 mg | Freq: Once | INTRAVENOUS | Status: AC
  Administered 2020-12-11: 15:00:00 30 mg via INTRAVENOUS

## 2020-12-11 MED ORDER — SODIUM CHLORIDE 0.9% IV SOLUTION: Freq: Once | INTRAVENOUS | Status: DC

## 2020-12-11 MED ORDER — HYDROMORPHONE HCL 1 MG/ML IJ SOLN
INTRAMUSCULAR | Status: AC
  Administered 2020-12-11: 16:00:00 1 mg via INTRAVENOUS
  Filled 2020-12-11: qty 1

## 2020-12-11 MED ORDER — MIDAZOLAM HCL 2 MG/2ML IJ SOLN
INTRAMUSCULAR | Status: DC | PRN
  Administered 2020-12-11: 1 mg via INTRAVENOUS
  Administered 2020-12-11: 2 mg via INTRAVENOUS

## 2020-12-11 MED ORDER — PROTAMINE SULFATE 10 MG/ML IV SOLN: 50.0000 mg | Freq: Once | INTRAVENOUS | Status: DC

## 2020-12-11 MED ORDER — METHYLPREDNISOLONE SODIUM SUCC 125 MG IJ SOLR: 125.0000 mg | Freq: Once | INTRAMUSCULAR | Status: DC | PRN

## 2020-12-11 MED ORDER — PROTAMINE SULFATE 10 MG/ML IV SOLN
INTRAVENOUS | Status: AC
  Filled 2020-12-11: qty 5

## 2020-12-11 MED ORDER — POTASSIUM CHLORIDE 10 MEQ/100ML IV SOLN
10.0000 meq | INTRAVENOUS | Status: AC
  Administered 2020-12-11 (×2): 10 meq via INTRAVENOUS
  Filled 2020-12-11: qty 100

## 2020-12-11 MED ORDER — HYDROMORPHONE HCL 1 MG/ML IJ SOLN: 1.0000 mg | Freq: Once | INTRAMUSCULAR | Status: AC | PRN

## 2020-12-11 MED ORDER — ONDANSETRON HCL 4 MG/2ML IJ SOLN
INTRAMUSCULAR | Status: AC
  Filled 2020-12-11: qty 2

## 2020-12-11 MED ORDER — MIDAZOLAM HCL 2 MG/ML PO SYRP
8.0000 mg | ORAL_SOLUTION | Freq: Once | ORAL | Status: AC | PRN
  Administered 2020-12-11: 14:00:00 8 mg via ORAL

## 2020-12-11 MED ORDER — CEFAZOLIN SODIUM-DEXTROSE 2-4 GM/100ML-% IV SOLN: 2.0000 g | Freq: Once | INTRAVENOUS | Status: AC

## 2020-12-11 MED ORDER — CEFAZOLIN SODIUM-DEXTROSE 2-4 GM/100ML-% IV SOLN
INTRAVENOUS | Status: AC
  Administered 2020-12-11: 15:00:00 2 g via INTRAVENOUS
  Filled 2020-12-11: qty 100

## 2020-12-11 MED ORDER — ONDANSETRON HCL 4 MG/2ML IJ SOLN
INTRAMUSCULAR | Status: DC | PRN
  Administered 2020-12-11: 4 mg via INTRAVENOUS

## 2020-12-11 MED ORDER — HEPARIN SODIUM (PORCINE) 1000 UNIT/ML IJ SOLN
INTRAMUSCULAR | Status: AC
  Filled 2020-12-11: qty 1

## 2020-12-11 MED ORDER — DIPHENHYDRAMINE HCL 50 MG/ML IJ SOLN: 50.0000 mg | Freq: Once | INTRAMUSCULAR | Status: DC | PRN

## 2020-12-11 MED ORDER — MIDAZOLAM HCL 5 MG/5ML IJ SOLN
INTRAMUSCULAR | Status: AC
  Filled 2020-12-11: qty 5

## 2020-12-11 SURGICAL SUPPLY — 15 items
BLOCK BEAD 500-700 (Vascular Products) ×1 IMPLANT
CATH ANGIO 5F PIGTAIL 65CM (CATHETERS) ×1 IMPLANT
CATH MICROCATH PRGRT 2.8F 130 (MICROCATHETER) IMPLANT
CATH VS15FR (CATHETERS) ×1 IMPLANT
COVER PROBE U/S 5X48 (MISCELLANEOUS) ×1 IMPLANT
DEVICE STARCLOSE SE CLOSURE (Vascular Products) ×1 IMPLANT
DEVICE TORQUE .025-.038 (MISCELLANEOUS) ×1 IMPLANT
GAUZE SPONGE 4X4 12PLY STRL (GAUZE/BANDAGES/DRESSINGS) ×2 IMPLANT
GLIDEWIRE STIFF .35X180X3 HYDR (WIRE) ×2 IMPLANT
MICROCATH PROGREAT 2.8F 130CM (MICROCATHETER) ×2
PACK ANGIOGRAPHY (CUSTOM PROCEDURE TRAY) ×2 IMPLANT
SHEATH BRITE TIP 5FRX11 (SHEATH) ×1 IMPLANT
SYR MEDRAD MARK 7 150ML (SYRINGE) ×1 IMPLANT
TUBING CONTRAST HIGH PRESS 72 (TUBING) ×1 IMPLANT
WIRE GUIDERIGHT .035X150 (WIRE) ×1 IMPLANT

## 2020-12-11 NOTE — Consult Note (Signed)
[facility] Consult Note  MRN : [id number]  VERGENE Mathis is a 45 y.o. (Aug 22, 1975) female who presents with chief complaint of  Chief Complaint  Patient presents with   Rectal Bleeding   History of Present Illness:  Cassidy Mathis is a 45 year old female with history of hypertension, NSAID use presented to ER last after she had 4 episodes of large-volume rectal bleeding, bright red blood per rectum.    Patient denies abdominal pain, nausea or vomiting.  She denies any rectal discomfort.  She reports that her bowel movements have been regular.  She denies constipation.  She felt lightheaded when she came to the ER.  Labs on admission revealed hemoglobin 11.8, dropped from 13.1 about a month ago.  She received IV fluids due to symptomatic anemia/presyncope. No evidence of chronic liver disease.  She underwent CT angio abdomen and pelvis which did not reveal active GI bleed.  She underwent capsule endoscopy which was notable for bleeding localized to either distal small bowel or proximal colon. She does smoke, occasional alcohol use.  NSAIDs: Regularly takes ibuprofen about 2-3 times weekly for headache. Antiplts/Anticoagulants/Anti thrombotics: None  Vascular surgery was consulted by Dr. Roosevelt Locks for possible endovascular intervention in the setting of gastrointestinal bleeding.  Current Facility-Administered Medications  Medication Dose Route Frequency Provider Last Rate Last Admin   0.9 %  sodium chloride infusion (Manually program via Guardrails IV Fluids)   Intravenous Once Sharen Hones, MD       0.9 %  sodium chloride infusion   Intravenous Continuous Toni Hoffmeister, Janalyn Harder, PA-C       acetaminophen (TYLENOL) tablet 650 mg  650 mg Oral Q6H PRN Jennye Boroughs, MD   650 mg at 12/10/20 1801   Or   acetaminophen (TYLENOL) suppository 650 mg  650 mg Rectal Q6H PRN Jennye Boroughs, MD       albuterol (PROVENTIL) (2.5 MG/3ML) 0.083% nebulizer solution 2.5 mg   2.5 mg Inhalation Q4H PRN Jennye Boroughs, MD       feeding supplement (ENSURE ENLIVE / ENSURE PLUS) liquid 237 mL  237 mL Oral BID BM Sharen Hones, MD   237 mL at 12/10/20 1540   hydrALAZINE (APRESOLINE) injection 5 mg  5 mg Intravenous Q4H PRN Jennye Boroughs, MD       ondansetron [facility]) tablet 4 mg  4 mg Oral Q6H PRN Jennye Boroughs, MD       Or   ondansetron [facility]) injection 4 mg  4 mg Intravenous Q6H PRN Jennye Boroughs, MD   4 mg at 12/05/20 2317   vitamin B-12 (CYANOCOBALAMIN) tablet 1,000 mcg  1,000 mcg Oral Daily Lin Landsman, MD   1,000 mcg at 12/10/20 1000   Past Medical History:  Diagnosis Date   Allergy    seasonal   Anemia    h/o   Asthma    Cancer (HCC)    cervical   Depression    GERD (gastroesophageal reflux disease)    RARE-NO MEDS   Headache    MIGRAINES   Hypertension    PT STATES SHE IS SUPPOSED TO BE TAKING LISINOPRIL-HCTZ BUT HAS BEEN OUT "FOR A WHILE" NEEDS TO GET ANOTHER PRESCRIPTION FROM HER PCP   Pancreatitis    Pilonidal cyst    TOOK ANTIBIOTIC THIS MONTH AND IT IS RESOLVED   Pneumonia 2018   Past Surgical History:  Procedure Laterality Date   ABDOMINAL HYSTERECTOMY     partial   CERVICAL CONIZATION W/BX  N/A 03/23/2017   Procedure: CONIZATION CERVIX WITH BIOPSY;  Surgeon: Gillis Ends, MD;  Location: [facility] ORS;  Service: Gynecology;  Laterality: N/A;   CHOLECYSTECTOMY  10/21/2015   Procedure: LAPAROSCOPIC CHOLECYSTECTOMY WITH INTRAOPERATIVE CHOLANGIOGRAM;  Surgeon: Jules Husbands, MD;  Location: [facility] ORS;  Service: General;;   CHOLECYSTECTOMY Bilateral    COLONOSCOPY WITH PROPOFOL N/A 12/06/2020   Procedure: COLONOSCOPY WITH PROPOFOL;  Surgeon: Lesly Rubenstein, MD;  Location: [facility] ENDOSCOPY;  Service: Endoscopy;  Laterality: N/A;   CYST EXCISION     tongue AND WRIST   EPIGASTRIC HERNIA REPAIR N/A 04/18/2015   Procedure: HERNIA REPAIR EPIGASTRIC ADULT;  Surgeon: Jules Husbands, MD;  Location: [facility] ORS;  Service: General;   Laterality: N/A;   ERCP N/A 12/21/2016   Procedure: ENDOSCOPIC RETROGRADE CHOLANGIOPANCREATOGRAPHY (ERCP);  Surgeon: [name], MD;  Location: [facility] ENDOSCOPY;  Service: Endoscopy;  Laterality: N/A;   ESOPHAGOGASTRODUODENOSCOPY  12/06/2020   Procedure: ESOPHAGOGASTRODUODENOSCOPY (EGD);  Surgeon: Lesly Rubenstein, MD;  Location: [facility] ENDOSCOPY;  Service: Endoscopy;;   GIVENS CAPSULE STUDY N/A 12/08/2020   Procedure: GIVENS CAPSULE STUDY;  Surgeon: Lin Landsman, MD;  Location: [facility] ENDOSCOPY;  Service: Gastroenterology;  Laterality: N/A;   HERNIA REPAIR     LAPAROSCOPIC SALPINGO OOPHERECTOMY Right 01/01/2020   Procedure: LAPAROSCOPIC SALPINGO OOPHORECTOMY;  Surgeon: Schermerhorn, Gwen Her, MD;  Location: [facility] ORS;  Service: Gynecology;  Laterality: Right;   LYSIS OF ADHESION N/A 01/01/2020   Procedure: LYSIS OF ADHESION;  Surgeon: Schermerhorn, Gwen Her, MD;  Location: [facility] ORS;  Service: Gynecology;  Laterality: N/A;   RADICAL HYSTERECTOMY  04/14/2017   SALPINGECTOMY Bilateral 04/14/2017   SENTINEL LYMPH NODE BIOPSY     TUBAL LIGATION     Social History Social History   Tobacco Use   Smoking status: Some Days    Packs/day: 0.50    Years: 12.00    Pack years: 6.00    Types: Cigarettes   Smokeless tobacco: Never   Tobacco comments:    11/28/2020 last smoked cigarettes 10 days ago  Vaping Use   Vaping Use: Never used  Substance Use Topics   Alcohol use: Yes    Comment: occassionally; less than 1/month   Drug use: Yes    Frequency: 1.0 times per week    Types: Marijuana   Family History Family History  Problem Relation Age of Onset   Hypertension Mother    Gallstones Mother    Hypertension Father    Gout Father    Cancer Paternal Uncle        lung   Cancer Maternal Grandmother        not sure what type of cancer   Cancer Maternal Grandfather        not sure what type of cancer   Cancer Paternal Grandmother        not sure what type of cancer   Cancer Paternal  Grandfather        not sure what type of cancer  Denies family history of peripheral artery disease, venous disease or renal disease.  No Known Allergies  REVIEW OF SYSTEMS (Negative unless checked)  Constitutional: [] Weight loss  [] Fever  [] Chills Cardiac: [] Chest pain   [] Chest pressure   [] Palpitations   [] Shortness of breath when laying flat   [] Shortness of breath at rest   [] Shortness of breath with exertion. Vascular:  [] Pain in legs with walking   [] Pain in legs at rest   [] Pain in legs when laying flat   []   Claudication   [] Pain in feet when walking  [] Pain in feet at rest  [] Pain in feet when laying flat   [] History of DVT   [] Phlebitis   [] Swelling in legs   [] Varicose veins   [] Non-healing ulcers Pulmonary:   [] Uses home oxygen   [] Productive cough   [] Hemoptysis   [] Wheeze  [] COPD   [] Asthma Neurologic:  [] Dizziness  [] Blackouts   [] Seizures   [] History of stroke   [] History of TIA  [] Aphasia   [] Temporary blindness   [] Dysphagia   [] Weakness or numbness in arms   [] Weakness or numbness in legs Musculoskeletal:  [] Arthritis   [] Joint swelling   [] Joint pain   [] Low back pain Hematologic:  [] Easy bruising  [x] Easy bleeding   [] Hypercoagulable state   [x] Anemic  [] Hepatitis Gastrointestinal:  [x] Blood in stool   [] Vomiting blood  [] Gastroesophageal reflux/heartburn   [] Difficulty swallowing. Genitourinary:  [] Chronic kidney disease   [] Difficult urination  [] Frequent urination  [] Burning with urination   [] Blood in urine Skin:  [] Rashes   [] Ulcers   [] Wounds Psychological:  [] History of anxiety   []  History of major depression.  Physical Examination  Vitals:   12/10/20 1610 12/10/20 2058 12/11/20 0037 12/11/20 0751  BP: 124/84 (!) 107/55 122/75 119/73  Pulse: (!) 101 (!) 105 93 96  Resp: 18 18 18 15   Temp: 98.5 F (36.9 C) 98.4 F (36.9 C) 99 F (37.2 C) 98.8 F (37.1 C)  TempSrc: Oral Oral Oral Oral  SpO2: 100% 99% 100% 99%  Weight:      Height:       Body mass index  is 37.11 kg/m. Gen:  WD/WN, NAD Head: [place]/AT, No temporalis wasting. Prominent temp pulse not noted. Ear/Nose/Throat: Hearing grossly intact, nares w/o erythema or drainage, oropharynx w/o Erythema/Exudate Eyes: Sclera non-icteric, conjunctiva clear Neck: Trachea midline.  No JVD.  Pulmonary:  Good air movement, respirations not labored, equal bilaterally.  Cardiac: RRR, normal S1, S2. Vascular:  Vessel Right Left  Radial Palpable Palpable  Ulnar Palpable Palpable   Gastrointestinal: soft, non-tender/non-distended. No guarding/reflex.  Musculoskeletal: M/S 5/5 throughout.  Extremities without ischemic changes.  No deformity or atrophy. No edema. Neurologic: Sensation grossly intact in extremities.  Symmetrical.  Speech is fluent. Motor exam as listed above. Psychiatric: Judgment intact, Mood & affect appropriate for pt's clinical situation. Dermatologic: No rashes or ulcers noted.  No cellulitis or open wounds. Lymph : No Cervical, Axillary, or Inguinal lymphadenopathy.  CBC Lab Results  Component Value Date   WBC 10.7 (H) 12/11/2020   HGB 7.7 (L) 12/11/2020   HCT 20.4 (L) 12/11/2020   MCV 88.7 12/11/2020   PLT 324 12/11/2020   BMET    Component Value Date/Time   NA 138 12/11/2020 0511   NA 140 11/28/2020 1024   NA 138 08/25/2012 1651   K 3.3 (L) 12/11/2020 0511   K 3.2 (L) 08/25/2012 1651   CL 108 12/11/2020 0511   CL 103 08/25/2012 1651   CO2 25 12/11/2020 0511   CO2 30 08/25/2012 1651   GLUCOSE 98 12/11/2020 0511   GLUCOSE 77 08/25/2012 1651   BUN 13 12/11/2020 0511   BUN 8 11/28/2020 1024   BUN 9 08/25/2012 1651   CREATININE 0.97 12/11/2020 0511   CREATININE 0.88 11/16/2017 0956   CALCIUM 8.3 (L) 12/11/2020 0511   CALCIUM 9.4 08/25/2012 1651   GFRNONAA >60 12/11/2020 0511   GFRNONAA 82 11/16/2017 0956   GFRAA >60 05/05/2019 [place] 95 11/16/2017 0956  Estimated Creatinine Clearance: 87.3 mL/min (by C-G formula based on SCr of 0.97 mg/dL).  COAG Lab  Results  Component Value Date   INR 1.0 12/04/2020   INR 0.93 03/22/2017   Radiology NM GI Blood Loss  Result Date: 12/07/2020 CLINICAL DATA:  GI bleed EXAM: NUCLEAR MEDICINE GASTROINTESTINAL BLEEDING SCAN TECHNIQUE: Sequential abdominal images were obtained following intravenous administration of Tc-45m labeled red blood cells. RADIOPHARMACEUTICALS:  22.59 mCi Tc-31m pertechnetate in-vitro labeled red cells. COMPARISON:  None. FINDINGS: No abnormal foci of tracer activity. Progressive rounded pelvic uptake which does not change location, likely physiologic bladder activity. IMPRESSION: No evidence of active GI bleed. Electronically Signed   By: Yetta Glassman M.D.   On: 12/07/2020 17:18   NM Bowel Img Meckels  Result Date: 12/10/2020 CLINICAL DATA:  Lower gastrointestinal bleed. Concern for Meckel's diverticulum. EXAM: NUCLEAR MEDICINE BOWEL (MECKEL'S) SCAN TECHNIQUE: Sequential abdominal images were obtained following intravenous injection of radiopharmaceutical. RADIOPHARMACEUTICALS:  10.9 mCi Tc-3m pertechnetate IV COMPARISON:  CT 11/26/2020, GI bleed scan 12/07/2020 FINDINGS: No accumulation of radiotracer to localize ectopic gastric mucosa (Meckel's diverticulum). Physiologic activity noted within the stomach and leaching into the proximal small bowel over time. IMPRESSION: No scintigraphic evidence of ectopic gastric mucosa (Meckel's diverticulum). Electronically Signed   By: Suzy Bouchard M.D.   On: 12/10/2020 09:29   US Venous Img Upper Uni Right(DVT)  Result Date: 12/10/2020 CLINICAL DATA:  Right upper extremity pain and edema following peripheral IV removal. EXAM: RIGHT UPPER EXTREMITY VENOUS DOPPLER ULTRASOUND TECHNIQUE: Gray-scale sonography with graded compression, as well as color Doppler and duplex ultrasound were performed to evaluate the upper extremity deep venous system from the level of the subclavian vein and including the jugular, axillary, basilic, radial, ulnar and  upper cephalic vein. Spectral Doppler was utilized to evaluate flow at rest and with distal augmentation maneuvers. COMPARISON:  None. FINDINGS: Contralateral Subclavian Vein: Respiratory phasicity is normal and symmetric with the symptomatic side. No evidence of thrombus. Normal compressibility. Internal Jugular Vein: No evidence of thrombus. Normal compressibility, respiratory phasicity and response to augmentation. Subclavian Vein: No evidence of thrombus. Normal compressibility, respiratory phasicity and response to augmentation. Axillary Vein: No evidence of thrombus. Normal compressibility, respiratory phasicity and response to augmentation. Cephalic Vein: There is hypoechoic occlusive thrombus involving the cephalic vein at the level of the forearm to the antecubital fossa, reportedly at the location of recently removed peripheral IV (images 32 two hundred thirty-four). Basilic Vein: No evidence of thrombus. Normal compressibility, respiratory phasicity and response to augmentation. Brachial Veins: No evidence of thrombus. Normal compressibility, respiratory phasicity and response to augmentation. Radial Veins: No evidence of thrombus. Normal compressibility, respiratory phasicity and response to augmentation. Ulnar Veins: No evidence of thrombus. Normal compressibility, respiratory phasicity and response to augmentation. Other Findings:  None visualized. IMPRESSION: 1. No evidence of DVT within the right upper extremity. 2. Examination is positive for short-segment occlusive SVT involving the cephalic vein at the level of the forearm and antecubital fossa, reportedly at the location of recently removed peripheral IV. Again, there is no extension of this peripheral SVT to the deep venous system of the right upper extremity. Electronically Signed   By: Sandi Mariscal M.D.   On: 12/10/2020 11:24   CT ENTERO ABD/PELVIS W CONTAST  Result Date: 12/11/2020 CLINICAL DATA:  Upper GI bleed, non variceal, negative  endoscopy EXAM: CT ABDOMEN AND PELVIS WITH CONTRAST (ENTEROGRAPHY) TECHNIQUE: Multidetector CT of the abdomen and pelvis during bolus administration of intravenous contrast. Negative oral contrast  was given. CONTRAST:  185mL OMNIPAQUE IOHEXOL 300 MG/ML SOLN, additional negative oral enteric contrast COMPARISON:  12/06/2020 FINDINGS: Lower chest: No acute abnormality. Hepatobiliary: No focal liver abnormality is seen. Status post cholecystectomy. No biliary dilatation. Pancreas: Unremarkable. No pancreatic ductal dilatation or surrounding inflammatory changes. Spleen: Normal in size without significant abnormality. Adrenals/Urinary Tract: Adrenal glands are unremarkable. Kidneys are normal, without renal calculi, solid lesion, or hydronephrosis. Bladder is unremarkable. Stomach/Bowel: Stomach is within normal limits. Appendix appears normal. No evidence of bowel wall thickening, distention, or inflammatory changes. Vascular/Lymphatic: Mixed aortic atherosclerosis. No enlarged abdominal or pelvic lymph nodes. Reproductive: Status post hysterectomy. Other: No abdominal wall hernia or abnormality. No abdominopelvic ascites. Musculoskeletal: No acute or significant osseous findings. IMPRESSION: 1. No CT findings of the abdomen or pelvis to explain or localize upper GI bleed. No evidence of intraluminal contrast extravasation. 2. Status post cholecystectomy and hysterectomy. 3. Aortic atherosclerosis, advanced for patient age and gender. Aortic Atherosclerosis (ICD10-I70.0). Electronically Signed   By: Delanna Ahmadi M.D.   On: 12/11/2020 07:55   CT Angio Abd/Pel w/ and/or w/o  Result Date: 12/06/2020 CLINICAL DATA:  Bright red blood per rectum EXAM: CTA ABDOMEN AND PELVIS WITHOUT AND WITH CONTRAST TECHNIQUE: Multidetector CT imaging of the abdomen and pelvis was performed using the standard protocol during bolus administration of intravenous contrast. Multiplanar reconstructed images and MIPs were obtained and  reviewed to evaluate the vascular anatomy. CONTRAST:  178mL OMNIPAQUE IOHEXOL 350 MG/ML SOLN COMPARISON:  CTA abdomen/pelvis dated 12/04/2020 FINDINGS: VASCULAR Aorta: Patent.  No evidence of aneurysm. Celiac: Patent. SMA: Patent. Renals: Patent bilaterally. IMA: Patent. Inflow: Patent bilaterally. Proximal Outflow: Patent bilaterally. Veins: Within normal limits. No evidence of active GI bleeding. Review of the MIP images confirms the above findings. NON-VASCULAR Lower chest: Lung bases are clear. Hepatobiliary: Liver is within normal limits. Status post cholecystectomy. No intrahepatic or extrahepatic duct dilatation. Pancreas: Within normal limits. Spleen: Within normal limits. Adrenals/Urinary Tract: Adrenal glands are within normal limits. Probable subcentimeter cyst in the interpolar right kidney (series 12/image 32). Left kidney is within normal limits. No hydronephrosis. Bladder is mildly thick-walled but underdistended. Stomach/Bowel: Stomach is within normal limits. No evidence of bowel obstruction. Normal appendix (series 12/image 86). No colonic wall thickening or mass. Lymphatic: No suspicious abdominopelvic lymphadenopathy. Reproductive: Status post hysterectomy. No adnexal masses. Other: No abdominopelvic ascites. Musculoskeletal: Visualized osseous structures are within normal limits. IMPRESSION: No evidence of active GI bleeding. No bowel wall thickening or mass is evident on CT. Electronically Signed   By: Julian Hy M.D.   On: 12/06/2020 03:22   CT Angio Abd/Pel w/ and/or w/o  Result Date: 12/04/2020 CLINICAL DATA:  Patient to ED via POV for rectal bleeding. Patient had 1 episode PTA of bright red bloody stools. Patient also complaining of upper abd pain and lightheaded. EXAM: CTA ABDOMEN AND PELVIS WITHOUT AND WITH CONTRAST TECHNIQUE: Multidetector CT imaging of the abdomen and pelvis was performed using the standard protocol during bolus administration of intravenous contrast.  Multiplanar reconstructed images and MIPs were obtained and reviewed to evaluate the vascular anatomy. CONTRAST:  139mL OMNIPAQUE IOHEXOL 350 MG/ML SOLN COMPARISON:  None. FINDINGS: VASCULAR Aorta: Mild atherosclerotic plaque. Normal caliber aorta without aneurysm, dissection, vasculitis or significant stenosis. Celiac: Patent without evidence of aneurysm, dissection, vasculitis or significant stenosis. SMA: Patent without evidence of aneurysm, dissection, vasculitis or significant stenosis. Renals: Both renal arteries are patent without evidence of aneurysm, dissection, vasculitis, fibromuscular dysplasia or significant stenosis. IMA: Patent without evidence of  aneurysm, dissection, vasculitis or significant stenosis. Inflow: Patent without evidence of aneurysm, dissection, vasculitis or significant stenosis. Proximal Outflow: Bilateral common femoral and visualized portions of the superficial and profunda femoral arteries are patent without evidence of aneurysm, dissection, vasculitis or significant stenosis. Veins: The hepatic, main portal, splenic, superior mesenteric veins are patent. Review of the MIP images confirms the above findings. NON-VASCULAR Lower chest: No acute abnormality. Hepatobiliary: No focal liver abnormality. Status post cholecystectomy. No biliary dilatation. Pancreas: No focal lesion. Normal pancreatic contour. No surrounding inflammatory changes. No main pancreatic ductal dilatation. Spleen: Normal in size without focal abnormality. Adrenals/Urinary Tract: No adrenal nodule bilaterally. Bilateral kidneys enhance symmetrically. No hydronephrosis. No hydroureter. The urinary bladder is unremarkable. Stomach/Bowel: Stomach is within normal limits. No evidence of bowel wall thickening or dilatation. Appendix appears normal. Lymphatic: No lymphadenopathy. Reproductive: Status post hysterectomy. No adnexal masses. Other: No intraperitoneal free fluid. No intraperitoneal free gas. No organized  fluid collection. Musculoskeletal: No abdominal wall hernia or abnormality. No suspicious lytic or blastic osseous lesions. No acute displaced fracture. IMPRESSION: VASCULAR 1. No CT evidence of an active bleed. 2. No acute vascular abnormality. 3.  Aortic Atherosclerosis (ICD10-I70.0). NON-VASCULAR 1. No acute intra-abdominal or intrapelvic abnormality. Electronically Signed   By: Iven Finn M.D.   On: 12/04/2020 21:17    Assessment/Plan Cassidy Mathis is a 45 year old female with history of hypertension, NSAID use presented to ER last after she had 4 episodes of large-volume rectal bleeding, bright red blood per rectum.   1.  Gastrointestinal bleeding: Patient presents to the [facility] Emergency Department after experiencing bloody bowel movements.  CTA without active bleed however capsule endoscopy was notable for bleeding localized to either distal small bowel or proximal colon.  Since a possible target has been identified vascular surgery was asked to consult for possible embolization.  In the setting of GI bleed with associated anemia we will perform a mesenteric angiogram and attempt to assess the patient's anatomy for possible embolization for hemostasis.  Procedure, risks and benefits were explained to the patient.  All questions were answered.  The patient wished to proceed.  We will plan on this today with Dr. Lucky Cowboy.  Patient is made n.p.o.  2.  Anemia due to GI bleed: Patient will be receiving blood transfusion today Continue to monitor hemoglobin  3.  Uncontrolled hypertension: Seems to be controlled at this time.  Dicussed with Dr. Mayme Genta, PA-C 12/11/2020 10:17 AM  This note was created with Dragon medical transcription system.  Any error is purely unintentional.

## 2020-12-11 NOTE — Addendum Note (Signed)
Addended by: Raelene Bott, Byran Bilotti L on: 12/11/2020 02:23 PM   Modules accepted: Orders

## 2020-12-11 NOTE — Interval H&P Note (Signed)
History and Physical Interval Note:  12/11/2020 2:08 PM  Cassidy Mathis  has presented today for surgery, with the diagnosis of GI Bleed.  The various methods of treatment have been discussed with the patient and family. After consideration of risks, benefits and other options for treatment, the patient has consented to  Procedure(s): VISCERAL ANGIOGRAPHY (N/A) as a surgical intervention.  The patient's history has been reviewed, patient examined, no change in status, stable for surgery.  I have reviewed the patient's chart and labs.  Questions were answered to the patient's satisfaction.     Leotis Pain

## 2020-12-11 NOTE — H&P (View-Only) (Signed)
[facility] Consult Note  MRN : [id number]  Cassidy Mathis is a 45 y.o. (1975-07-12) female who presents with chief complaint of  Chief Complaint  Patient presents with   Rectal Bleeding   History of Present Illness:  Cassidy Mathis is a 45 year old female with history of hypertension, NSAID use presented to ER last after she had 4 episodes of large-volume rectal bleeding, bright red blood per rectum.    Patient denies abdominal pain, nausea or vomiting.  She denies any rectal discomfort.  She reports that her bowel movements have been regular.  She denies constipation.  She felt lightheaded when she came to the ER.  Labs on admission revealed hemoglobin 11.8, dropped from 13.1 about a month ago.  She received IV fluids due to symptomatic anemia/presyncope. No evidence of chronic liver disease.  She underwent CT angio abdomen and pelvis which did not reveal active GI bleed.  She underwent capsule endoscopy which was notable for bleeding localized to either distal small bowel or proximal colon. She does smoke, occasional alcohol use.  NSAIDs: Regularly takes ibuprofen about 2-3 times weekly for headache. Antiplts/Anticoagulants/Anti thrombotics: None  Vascular surgery was consulted by Dr. Roosevelt Locks for possible endovascular intervention in the setting of gastrointestinal bleeding.  Current Facility-Administered Medications  Medication Dose Route Frequency Provider Last Rate Last Admin   0.9 %  sodium chloride infusion (Manually program via Guardrails IV Fluids)   Intravenous Once Sharen Hones, MD       0.9 %  sodium chloride infusion   Intravenous Continuous Manoj Enriquez, Janalyn Harder, PA-C       acetaminophen (TYLENOL) tablet 650 mg  650 mg Oral Q6H PRN Jennye Boroughs, MD   650 mg at 12/10/20 1801   Or   acetaminophen (TYLENOL) suppository 650 mg  650 mg Rectal Q6H PRN Jennye Boroughs, MD       albuterol (PROVENTIL) (2.5 MG/3ML) 0.083% nebulizer solution 2.5 mg   2.5 mg Inhalation Q4H PRN Jennye Boroughs, MD       feeding supplement (ENSURE ENLIVE / ENSURE PLUS) liquid 237 mL  237 mL Oral BID BM Sharen Hones, MD   237 mL at 12/10/20 1540   hydrALAZINE (APRESOLINE) injection 5 mg  5 mg Intravenous Q4H PRN Jennye Boroughs, MD       ondansetron [facility]) tablet 4 mg  4 mg Oral Q6H PRN Jennye Boroughs, MD       Or   ondansetron [facility]) injection 4 mg  4 mg Intravenous Q6H PRN Jennye Boroughs, MD   4 mg at 12/05/20 2317   vitamin B-12 (CYANOCOBALAMIN) tablet 1,000 mcg  1,000 mcg Oral Daily Lin Landsman, MD   1,000 mcg at 12/10/20 1000   Past Medical History:  Diagnosis Date   Allergy    seasonal   Anemia    h/o   Asthma    Cancer (HCC)    cervical   Depression    GERD (gastroesophageal reflux disease)    RARE-NO MEDS   Headache    MIGRAINES   Hypertension    PT STATES SHE IS SUPPOSED TO BE TAKING LISINOPRIL-HCTZ BUT HAS BEEN OUT "FOR A WHILE" NEEDS TO GET ANOTHER PRESCRIPTION FROM HER PCP   Pancreatitis    Pilonidal cyst    TOOK ANTIBIOTIC THIS MONTH AND IT IS RESOLVED   Pneumonia 2018   Past Surgical History:  Procedure Laterality Date   ABDOMINAL HYSTERECTOMY     partial   CERVICAL CONIZATION W/BX  N/A 03/23/2017   Procedure: CONIZATION CERVIX WITH BIOPSY;  Surgeon: Gillis Ends, MD;  Location: [facility] ORS;  Service: Gynecology;  Laterality: N/A;   CHOLECYSTECTOMY  10/21/2015   Procedure: LAPAROSCOPIC CHOLECYSTECTOMY WITH INTRAOPERATIVE CHOLANGIOGRAM;  Surgeon: Jules Husbands, MD;  Location: [facility];  Service: General;;   CHOLECYSTECTOMY Bilateral    COLONOSCOPY WITH PROPOFOL N/A 12/06/2020   Procedure: COLONOSCOPY WITH PROPOFOL;  Surgeon: Lesly Rubenstein, MD;  Location: [facility] ENDOS[facility];  Service: Endoscopy;  Laterality: N/A;   CYST EXCISION     tongue AND WRIST   EPIGASTRIC HERNIA REPAIR N/A 04/18/2015   Procedure: HERNIA REPAIR EPIGASTRIC ADULT;  Surgeon: Jules Husbands, MD;  Location: [facility];  Service: General;   Laterality: N/A;   ERCP N/A 12/21/2016   Procedure: ENDOSCOPIC RETROGRADE CHOLANGIOPANCREATOGRAPHY (ERCP);  Surgeon: [name], MD;  Location: [facility] ENDOSCOPY;  Service: Endoscopy;  Laterality: N/A;   ESOPHAGOGASTRODUODENOSCOPY  12/06/2020   Procedure: ESOPHAGOGASTRODUODENOSCOPY (EGD);  Surgeon: Lesly Rubenstein, MD;  Location: [facility] ENDOSCOPY;  Service: Endoscopy;;   GIVENS CAPSULE STUDY N/A 12/08/2020   Procedure: GIVENS CAPSULE STUDY;  Surgeon: Lin Landsman, MD;  Location: [facility] ENDOSCOPY;  Service: Gastroenterology;  Laterality: N/A;   HERNIA REPAIR     LAPAROSCOPIC SALPINGO OOPHERECTOMY Right 01/01/2020   Procedure: LAPAROSCOPIC SALPINGO OOPHORECTOMY;  Surgeon: Schermerhorn, Gwen Her, MD;  Location: [facility] OR[facility];  Service: Gynecology;  Laterality: Right;   LYSIS OF ADHESION N/A 01/01/2020   Procedure: LYSIS OF ADHESION;  Surgeon: Schermerhorn, Gwen Her, MD;  Location: [facility] OR[facility];  Service: Gynecology;  Laterality: N/A;   RADICAL HYSTERECTOMY  04/14/2017   SALPINGECTOMY Bilateral 04/14/2017   SENTINEL LYMPH NODE BIOPSY     TUBAL LIGATION     Social History Social History   Tobacco Use   Smoking status: Some Days    Packs/day: 0.50    Years: 12.00    Pack years: 6.00    Types: Cigarettes   Smokeless tobacco: Never   Tobacco comments:    11/28/2020 last smoked cigarettes 10 days ago  Vaping Use   Vaping Use: Never used  Substance Use Topics   Alcohol use: Yes    Comment: occassionally; less than 1/month   Drug use: Yes    Frequency: 1.0 times per week    Types: Marijuana   Family History Family History  Problem Relation Age of Onset   Hypertension Mother    Gallstones Mother    Hypertension Father    Gout Father    Cancer Paternal Uncle        lung   Cancer Maternal Grandmother        not sure what type of cancer   Cancer Maternal Grandfather        not sure what type of cancer   Cancer Paternal Grandmother        not sure what type of cancer   Cancer Paternal  Grandfather        not sure what type of cancer  Denies family history of peripheral artery disease, venous disease or renal disease.  No Known Allergies  REVIEW OF SYSTEMS (Negative unless checked)  Constitutional: [] Weight loss  [] Fever  [] Chills Cardiac: [] Chest pain   [] Chest pressure   [] Palpitations   [] Shortness of breath when laying flat   [] Shortness of breath at rest   [] Shortness of breath with exertion. Vascular:  [] Pain in legs with walking   [] Pain in legs at rest   [] Pain in legs when laying flat   []   Claudication   [] Pain in feet when walking  [] Pain in feet at rest  [] Pain in feet when laying flat   [] History of DVT   [] Phlebitis   [] Swelling in legs   [] Varicose veins   [] Non-healing ulcers Pulmonary:   [] Uses home oxygen   [] Productive cough   [] Hemoptysis   [] Wheeze  [] COPD   [] Asthma Neurologic:  [] Dizziness  [] Blackouts   [] Seizures   [] History of stroke   [] History of TIA  [] Aphasia   [] Temporary blindness   [] Dysphagia   [] Weakness or numbness in arms   [] Weakness or numbness in legs Musculoskeletal:  [] Arthritis   [] Joint swelling   [] Joint pain   [] Low back pain Hematologic:  [] Easy bruising  [x] Easy bleeding   [] Hypercoagulable state   [x] Anemic  [] Hepatitis Gastrointestinal:  [x] Blood in stool   [] Vomiting blood  [] Gastroesophageal reflux/heartburn   [] Difficulty swallowing. Genitourinary:  [] Chronic kidney disease   [] Difficult urination  [] Frequent urination  [] Burning with urination   [] Blood in urine Skin:  [] Rashes   [] Ulcers   [] Wounds Psychological:  [] History of anxiety   []  History of major depression.  Physical Examination  Vitals:   12/10/20 1610 12/10/20 2058 12/11/20 0037 12/11/20 0751  BP: 124/84 (!) 107/55 122/75 119/73  Pulse: (!) 101 (!) 105 93 96  Resp: 18 18 18 15   Temp: 98.5 F (36.9 C) 98.4 F (36.9 C) 99 F (37.2 C) 98.8 F (37.1 C)  TempSrc: Oral Oral Oral Oral  SpO2: 100% 99% 100% 99%  Weight:      Height:       Body mass index  is 37.11 kg/m. Gen:  WD/WN, NAD Head: [place]/AT, No temporalis wasting. Prominent temp pulse not noted. Ear/Nose/Throat: Hearing grossly intact, nares w/o erythema or drainage, oropharynx w/o Erythema/Exudate Eyes: Sclera non-icteric, conjunctiva clear Neck: Trachea midline.  No JVD.  Pulmonary:  Good air movement, respirations not labored, equal bilaterally.  Cardiac: RRR, normal S1, S2. Vascular:  Vessel Right Left  Radial Palpable Palpable  Ulnar Palpable Palpable   Gastrointestinal: soft, non-tender/non-distended. No guarding/reflex.  Musculoskeletal: M/S 5/5 throughout.  Extremities without ischemic changes.  No deformity or atrophy. No edema. Neurologic: Sensation grossly intact in extremities.  Symmetrical.  Speech is fluent. Motor exam as listed above. Psychiatric: Judgment intact, Mood & affect appropriate for pt's clinical situation. Dermatologic: No rashes or ulcers noted.  No cellulitis or open wounds. Lymph : No Cervical, Axillary, or Inguinal lymphadenopathy.  CBC Lab Results  Component Value Date   WBC 10.7 (H) 12/11/2020   HGB 7.7 (L) 12/11/2020   HCT 20.4 (L) 12/11/2020   MCV 88.7 12/11/2020   PLT 324 12/11/2020   BMET    Component Value Date/Time   NA 138 12/11/2020 0511   NA 140 11/28/2020 1024   NA 138 08/25/2012 1651   K 3.3 (L) 12/11/2020 0511   K 3.2 (L) 08/25/2012 1651   CL 108 12/11/2020 0511   CL 103 08/25/2012 1651   CO2 25 12/11/2020 0511   CO2 30 08/25/2012 1651   GLUCOSE 98 12/11/2020 0511   GLUCOSE 77 08/25/2012 1651   BUN 13 12/11/2020 0511   BUN 8 11/28/2020 1024   BUN 9 08/25/2012 1651   CREATININE 0.97 12/11/2020 0511   CREATININE 0.88 11/16/2017 0956   CALCIUM 8.3 (L) 12/11/2020 0511   CALCIUM 9.4 08/25/2012 1651   GFRNONAA >60 12/11/2020 0511   GFRNONAA 82 11/16/2017 0956   GFRAA >60 05/05/2019 [place] 95 11/16/2017 0956  Estimated Creatinine Clearance: 87.3 mL/min (by C-G formula based on SCr of 0.97 mg/dL).  COAG Lab  Results  Component Value Date   INR 1.0 12/04/2020   INR 0.93 03/22/2017   Radiology NM GI Blood Loss  Result Date: 12/07/2020 CLINICAL DATA:  GI bleed EXAM: NUCLEAR MEDICINE GASTROINTESTINAL BLEEDING SCAN TECHNIQUE: Sequential abdominal images were obtained following intravenous administration of Tc-59m labeled red blood cells. RADIOPHARMACEUTICALS:  22.59 mCi Tc-17m pertechnetate in-vitro labeled red cells. COMPARISON:  None. FINDINGS: No abnormal foci of tracer activity. Progressive rounded pelvic uptake which does not change location, likely physiologic bladder activity. IMPRESSION: No evidence of active GI bleed. Electronically Signed   By: Yetta Glassman M.D.   On: 12/07/2020 17:18   NM Bowel Img Meckels  Result Date: 12/10/2020 CLINICAL DATA:  Lower gastrointestinal bleed. Concern for Meckel's diverticulum. EXAM: NUCLEAR MEDICINE BOWEL (MECKEL'S) SCAN TECHNIQUE: Sequential abdominal images were obtained following intravenous injection of radiopharmaceutical. RADIOPHARMACEUTICALS:  10.9 mCi Tc-35m pertechnetate IV COMPARISON:  CT 11/26/2020, GI bleed scan 12/07/2020 FINDINGS: No accumulation of radiotracer to localize ectopic gastric mucosa (Meckel's diverticulum). Physiologic activity noted within the stomach and leaching into the proximal small bowel over time. IMPRESSION: No scintigraphic evidence of ectopic gastric mucosa (Meckel's diverticulum). Electronically Signed   By: Suzy Bouchard M.D.   On: 12/10/2020 09:29   US Venous Img Upper Uni Right(DVT)  Result Date: 12/10/2020 CLINICAL DATA:  Right upper extremity pain and edema following peripheral IV removal. EXAM: RIGHT UPPER EXTREMITY VENOUS DOPPLER ULTRASOUND TECHNIQUE: Gray-scale sonography with graded compression, as well as color Doppler and duplex ultrasound were performed to evaluate the upper extremity deep venous system from the level of the subclavian vein and including the jugular, axillary, basilic, radial, ulnar and  upper cephalic vein. Spectral Doppler was utilized to evaluate flow at rest and with distal augmentation maneuvers. COMPARISON:  None. FINDINGS: Contralateral Subclavian Vein: Respiratory phasicity is normal and symmetric with the symptomatic side. No evidence of thrombus. Normal compressibility. Internal Jugular Vein: No evidence of thrombus. Normal compressibility, respiratory phasicity and response to augmentation. Subclavian Vein: No evidence of thrombus. Normal compressibility, respiratory phasicity and response to augmentation. Axillary Vein: No evidence of thrombus. Normal compressibility, respiratory phasicity and response to augmentation. Cephalic Vein: There is hypoechoic occlusive thrombus involving the cephalic vein at the level of the forearm to the antecubital fossa, reportedly at the location of recently removed peripheral IV (images 32 two hundred thirty-four). Basilic Vein: No evidence of thrombus. Normal compressibility, respiratory phasicity and response to augmentation. Brachial Veins: No evidence of thrombus. Normal compressibility, respiratory phasicity and response to augmentation. Radial Veins: No evidence of thrombus. Normal compressibility, respiratory phasicity and response to augmentation. Ulnar Veins: No evidence of thrombus. Normal compressibility, respiratory phasicity and response to augmentation. Other Findings:  None visualized. IMPRESSION: 1. No evidence of DVT within the right upper extremity. 2. Examination is positive for short-segment occlusive SVT involving the cephalic vein at the level of the forearm and antecubital fossa, reportedly at the location of recently removed peripheral IV. Again, there is no extension of this peripheral SVT to the deep venous system of the right upper extremity. Electronically Signed   By: Sandi Mariscal M.D.   On: 12/10/2020 11:24   CT ENTERO ABD/PELVIS W CONTAST  Result Date: 12/11/2020 CLINICAL DATA:  Upper GI bleed, non variceal, negative  endoscopy EXAM: CT ABDOMEN AND PELVIS WITH CONTRAST (ENTEROGRAPHY) TECHNIQUE: Multidetector CT of the abdomen and pelvis during bolus administration of intravenous contrast. Negative oral contrast  was given. CONTRAST:  136mL OMNIPAQUE IOHEXOL 300 MG/ML SOLN, additional negative oral enteric contrast COMPARISON:  12/06/2020 FINDINGS: Lower chest: No acute abnormality. Hepatobiliary: No focal liver abnormality is seen. Status post cholecystectomy. No biliary dilatation. Pancreas: Unremarkable. No pancreatic ductal dilatation or surrounding inflammatory changes. Spleen: Normal in size without significant abnormality. Adrenals/Urinary Tract: Adrenal glands are unremarkable. Kidneys are normal, without renal calculi, solid lesion, or hydronephrosis. Bladder is unremarkable. Stomach/Bowel: Stomach is within normal limits. Appendix appears normal. No evidence of bowel wall thickening, distention, or inflammatory changes. Vascular/Lymphatic: Mixed aortic atherosclerosis. No enlarged abdominal or pelvic lymph nodes. Reproductive: Status post hysterectomy. Other: No abdominal wall hernia or abnormality. No abdominopelvic ascites. Musculoskeletal: No acute or significant osseous findings. IMPRESSION: 1. No CT findings of the abdomen or pelvis to explain or localize upper GI bleed. No evidence of intraluminal contrast extravasation. 2. Status post cholecystectomy and hysterectomy. 3. Aortic atherosclerosis, advanced for patient age and gender. Aortic Atherosclerosis (ICD10-I70.0). Electronically Signed   By: Delanna Ahmadi M.D.   On: 12/11/2020 07:55   CT Angio Abd/Pel w/ and/or w/o  Result Date: 12/06/2020 CLINICAL DATA:  Bright red blood per rectum EXAM: CTA ABDOMEN AND PELVIS WITHOUT AND WITH CONTRAST TECHNIQUE: Multidetector CT imaging of the abdomen and pelvis was performed using the standard protocol during bolus administration of intravenous contrast. Multiplanar reconstructed images and MIPs were obtained and  reviewed to evaluate the vascular anatomy. CONTRAST:  15mL OMNIPAQUE IOHEXOL 350 MG/ML SOLN COMPARISON:  CTA abdomen/pelvis dated 12/04/2020 FINDINGS: VASCULAR Aorta: Patent.  No evidence of aneurysm. Celiac: Patent. SMA: Patent. Renals: Patent bilaterally. IMA: Patent. Inflow: Patent bilaterally. Proximal Outflow: Patent bilaterally. Veins: Within normal limits. No evidence of active GI bleeding. Review of the MIP images confirms the above findings. NON-VASCULAR Lower chest: Lung bases are clear. Hepatobiliary: Liver is within normal limits. Status post cholecystectomy. No intrahepatic or extrahepatic duct dilatation. Pancreas: Within normal limits. Spleen: Within normal limits. Adrenals/Urinary Tract: Adrenal glands are within normal limits. Probable subcentimeter cyst in the interpolar right kidney (series 12/image 32). Left kidney is within normal limits. No hydronephrosis. Bladder is mildly thick-walled but underdistended. Stomach/Bowel: Stomach is within normal limits. No evidence of bowel obstruction. Normal appendix (series 12/image 86). No colonic wall thickening or mass. Lymphatic: No suspicious abdominopelvic lymphadenopathy. Reproductive: Status post hysterectomy. No adnexal masses. Other: No abdominopelvic ascites. Musculoskeletal: Visualized osseous structures are within normal limits. IMPRESSION: No evidence of active GI bleeding. No bowel wall thickening or mass is evident on CT. Electronically Signed   By: Julian Hy M.D.   On: 12/06/2020 03:22   CT Angio Abd/Pel w/ and/or w/o  Result Date: 12/04/2020 CLINICAL DATA:  Patient to ED via POV for rectal bleeding. Patient had 1 episode PTA of bright red bloody stools. Patient also complaining of upper abd pain and lightheaded. EXAM: CTA ABDOMEN AND PELVIS WITHOUT AND WITH CONTRAST TECHNIQUE: Multidetector CT imaging of the abdomen and pelvis was performed using the standard protocol during bolus administration of intravenous contrast.  Multiplanar reconstructed images and MIPs were obtained and reviewed to evaluate the vascular anatomy. CONTRAST:  175mL OMNIPAQUE IOHEXOL 350 MG/ML SOLN COMPARISON:  None. FINDINGS: VASCULAR Aorta: Mild atherosclerotic plaque. Normal caliber aorta without aneurysm, dissection, vasculitis or significant stenosis. Celiac: Patent without evidence of aneurysm, dissection, vasculitis or significant stenosis. SMA: Patent without evidence of aneurysm, dissection, vasculitis or significant stenosis. Renals: Both renal arteries are patent without evidence of aneurysm, dissection, vasculitis, fibromuscular dysplasia or significant stenosis. IMA: Patent without evidence of  aneurysm, dissection, vasculitis or significant stenosis. Inflow: Patent without evidence of aneurysm, dissection, vasculitis or significant stenosis. Proximal Outflow: Bilateral common femoral and visualized portions of the superficial and profunda femoral arteries are patent without evidence of aneurysm, dissection, vasculitis or significant stenosis. Veins: The hepatic, main portal, splenic, superior mesenteric veins are patent. Review of the MIP images confirms the above findings. NON-VASCULAR Lower chest: No acute abnormality. Hepatobiliary: No focal liver abnormality. Status post cholecystectomy. No biliary dilatation. Pancreas: No focal lesion. Normal pancreatic contour. No surrounding inflammatory changes. No main pancreatic ductal dilatation. Spleen: Normal in size without focal abnormality. Adrenals/Urinary Tract: No adrenal nodule bilaterally. Bilateral kidneys enhance symmetrically. No hydronephrosis. No hydroureter. The urinary bladder is unremarkable. Stomach/Bowel: Stomach is within normal limits. No evidence of bowel wall thickening or dilatation. Appendix appears normal. Lymphatic: No lymphadenopathy. Reproductive: Status post hysterectomy. No adnexal masses. Other: No intraperitoneal free fluid. No intraperitoneal free gas. No organized  fluid collection. Musculoskeletal: No abdominal wall hernia or abnormality. No suspicious lytic or blastic osseous lesions. No acute displaced fracture. IMPRESSION: VASCULAR 1. No CT evidence of an active bleed. 2. No acute vascular abnormality. 3.  Aortic Atherosclerosis (ICD10-I70.0). NON-VASCULAR 1. No acute intra-abdominal or intrapelvic abnormality. Electronically Signed   By: Iven Finn M.D.   On: 12/04/2020 21:17    Assessment/Plan Cassidy Mathis is a 45 year old female with history of hypertension, NSAID use presented to ER last after she had 4 episodes of large-volume rectal bleeding, bright red blood per rectum.   1.  Gastrointestinal bleeding: Patient presents to the [facility] Department after experiencing bloody bowel movements.  CTA without active bleed however capsule endoscopy was notable for bleeding localized to either distal small bowel or proximal colon.  Since a possible target has been identified vascular surgery was asked to consult for possible embolization.  In the setting of GI bleed with associated anemia we will perform a mesenteric angiogram and attempt to assess the patient's anatomy for possible embolization for hemostasis.  Procedure, risks and benefits were explained to the patient.  All questions were answered.  The patient wished to proceed.  We will plan on this today with Dr. Lucky Cowboy.  Patient is made n.p.o.  2.  Anemia due to GI bleed: Patient will be receiving blood transfusion today Continue to monitor hemoglobin  3.  Uncontrolled hypertension: Seems to be controlled at this time.  Dicussed with Dr. Mayme Genta, PA-C 12/11/2020 10:17 AM  This note was created with Dragon medical transcription system.  Any error is purely unintentional.

## 2020-12-11 NOTE — Op Note (Signed)
[facility]  Percutaneous Study/Intervention Procedural Note     Surgeon(s): [employer]   Assistants: none  Pre-operative Diagnosis: 1. Lower GI bleed 2.  Capsule endoscopy suggesting source of bleeding in the terminal ileum/cecum   Post-operative diagnosis:  Same  Procedure(s) Performed:             1.  Ultrasound guidance for vascular access right femoral artery             2.  Catheter placement into cecal branch of the superior mesenteric artery from right femoral approach             3.  Aortogram and selective provocative angiogram of the SMA as well as selective imaging of the cecal branch of the superior mesenteric artery             4.  Microbead embolization of 1.5 cc with 500-700  polyvinyl alcohol beads.             5.  StarClose closure device right femoral artery  Anesthesia: Moderate conscious sedation for approximately 25 minutes using 3 mg of Versed and 75 mcg of Fentanyl              EBL: 5 cc  Fluoro Time: 3.4 minutes  Contrast: 50 cc              Indications:  Patient is a 45 y.o.female with brisk lower GI bleeding with resultant anemia. The patient has a nuclear medicine study showing no obvious source and a CT scan not demonstrating a source, but a capsule endoscopy suggested the bleeding was in the terminal ileum or cecum.  We are asked perform provocative angiogram to try to delineate the source further as well as treat the area in the terminal ileum or cecum if no other source is seen. The patient is brought in for angiography for further evaluation and potential treatment. Risks and benefits are discussed and informed consent is obtained  Procedure:  The patient was identified and appropriate procedural time out was performed.  The patient was then placed supine on the table and prepped and draped in the usual sterile fashion. Moderate conscious sedation was administered during a face to face encounter with the patient throughout the  procedure with my supervision of the RN administering medicines and monitoring the patient's vital signs, pulse oximetry, telemetry and mental status throughout from the start of the procedure until the patient was taken to the recovery room.  Ultrasound was used to evaluate the right common femoral artery.  It was patent .  A digital ultrasound image was acquired.  A Seldinger needle was used to access the right common femoral artery under direct ultrasound guidance and a permanent image was performed.  A 0.035 J wire was advanced without resistance and a 5Fr sheath was placed.  Pigtail catheter was placed into the aorta and an AP aortogram was performed. This demonstrated fairly normal origins of the renal arteries and no significant stenosis in the aorta or iliac arteries we transitioned to the lateral projection to cannulate the superior mesenteric artery. A V S1 catheter was used to selectively cannulate the superior mesenteric artery.  This demonstrated no significant stenosis with a normal course of the vessel.  I transition back to the AP plane to see better imaging and did not see any obvious source of bleeding.  We then gave 5000 units of intravenous heparin and repeated the angiograms.  Mesenteric artery and again no obvious  source of bleeding was seen in any branch vessel. Based on her continued bleeding and the nuclear medicine study I elected to treat this area with embolization. I initially advanced the Pro-Great microcatheter out the superior mesenteric artery and out to the relatively large cecal branch which also had a downgoing branch at the terminal ileum and two lateral branches going into the cecum.  At this point, selective imaging was performed but there was still not really a clear bleeding source.  However, given the findings of the capsule endoscopy I felt it was reasonable to treat and so we and instilled approximately 1.5 cc of 500 to 700 m polyvinyl alcohol beads in this location after  reversing the heparin with 30 mg of protamine.  This would encompass the predominant blood flow to the cecum and terminal ileum.  The prograde microcatheter was then removed and imaging through the V S1 catheter showed brisk flow in the SMA and still no obvious bleeding source but there was less brisk filling of the cecum and terminal ileum on this injection.  I elected to terminate the procedure. The diagnostic catheter was removed. StarClose closure device was deployed in usual fashion with excellent hemostatic result. The patient was taken to the recovery room in stable condition having tolerated the procedure well.     Findings: No obvious bleeding source with heparinization.  Area of question from the capsule endoscopy was treated with polyvinyl alcohol microbead embolization.  Disposition: Patient was taken to the recovery room in stable condition having tolerated the procedure well.  Complications:  None  Leotis Pain 12/11/2020 3:19 PM   This note was created with Dragon Medical transcription system. Any errors in dictation are purely unintentional.

## 2020-12-11 NOTE — Consult Note (Signed)
[facility] SURGICAL CONSULTATION NOTE (initial) - cpt: 48546   HISTORY OF PRESENT ILLNESS (HPI):  45 y.o. female initially presented to [facility] ED on 11/10 for evaluation of rectal bleeding. At that time, patient had reported mild crampy abdominal pain and then a bright red blood bowel movement. She had no further episodes of abdominal pain but continued to have bright red blood per rectum. No fever, chills, nausea, emesis. Work up in the ED revealed a Hgb of 11. CTA Abdomen/pelvis in the ED was negative for any acute signs of bleeding. She was ultimately admitted to the hospitalist. GI was consulted the following morning. On the night of 11/11, she had a significant episode of bright blood in her stool which resulted in a rapid response being called. She received her first unit of pRBCs at that time. She underwent EGD and Colonoscopy on 11/12 which I have personally reviewed and were without any findings of bleeding and otherwise unremarkable. Again on 11/13, she had another significant episode of blood per rectum which resulted in rapid response being called. She was transfused 2 units pRBCs at that time. She underwent tagged red blood cell scan on 11/13 but this was again without obvious source of bleeding. Capsule endoscopy was intiiaed as well on this date. She would then undergo a Meckel's Scan on 11/16 which was also negative. She also would undergo CT Enterography on 11/16 as well, but again, this was without signs of bleeding nor other intra-abdominal etiologies. Unfortunately, she continued to have reported blood in her stool and her Hgb trended down this morning to 7.0. plan to give another unit pRBCs. Vascular surgery has been consulted for angiography today. GI continues to follow.   Please note, she doe shave a significant intra-abdominal surgical history including abdominal hysterectomy for cervical cancer, laparoscopic cholecystectomy, epigastric hernia repair, laparoscopic  salpingo-oophorectomy, and laparoscopic lysis of adhesions.   Surgery is consulted by hospitalist physician Dr. Sharen Hones, MD in this context for evaluation and management of GI bleeding.  PAST MEDICAL HISTORY (PMH):  Past Medical History:  Diagnosis Date   Allergy    seasonal   Anemia    h/o   Asthma    Cancer (HCC)    cervical   Depression    GERD (gastroesophageal reflux disease)    RARE-NO MEDS   Headache    MIGRAINES   Hypertension    PT STATES SHE IS SUPPOSED TO BE TAKING LISINOPRIL-HCTZ BUT HAS BEEN OUT "FOR A WHILE" NEEDS TO GET ANOTHER PRESCRIPTION FROM HER PCP   Pancreatitis    Pilonidal cyst    TOOK ANTIBIOTIC THIS MONTH AND IT IS RESOLVED   Pneumonia 2018     PAST SURGICAL HISTORY ([place]):  Past Surgical History:  Procedure Laterality Date   ABDOMINAL HYSTERECTOMY     partial   CERVICAL CONIZATION W/BX N/A 03/23/2017   Procedure: CONIZATION CERVIX WITH BIOPSY;  Surgeon: Gillis Ends, MD;  Location: [facility] ORS;  Service: Gynecology;  Laterality: N/A;   CHOLECYSTECTOMY  10/21/2015   Procedure: LAPAROSCOPIC CHOLECYSTECTOMY WITH INTRAOPERATIVE CHOLANGIOGRAM;  Surgeon: Jules Husbands, MD;  Location: [facility] ORS;  Service: General;;   CHOLECYSTECTOMY Bilateral    COLONOSCOPY WITH PROPOFOL N/A 12/06/2020   Procedure: COLONOSCOPY WITH PROPOFOL;  Surgeon: Lesly Rubenstein, MD;  Location: [facility] ENDOSCOPY;  Service: Endoscopy;  Laterality: N/A;   CYST EXCISION     tongue AND WRIST   EPIGASTRIC HERNIA REPAIR N/A 04/18/2015   Procedure: HERNIA REPAIR EPIGASTRIC ADULT;  Surgeon:  Diego Sarita Haver, MD;  Location: [facility] ORS;  Service: General;  Laterality: N/A;   ERCP N/A 12/21/2016   Procedure: ENDOSCOPIC RETROGRADE CHOLANGIOPANCREATOGRAPHY (ERCP);  Surgeon: [name], MD;  Location: [facility] ENDOSCOPY;  Service: Endoscopy;  Laterality: N/A;   ESOPHAGOGASTRODUODENOSCOPY  12/06/2020   Procedure: ESOPHAGOGASTRODUODENOSCOPY (EGD);  Surgeon: Lesly Rubenstein, MD;  Location:  [facility] ENDOSCOPY;  Service: Endoscopy;;   GIVENS CAPSULE STUDY N/A 12/08/2020   Procedure: GIVENS CAPSULE STUDY;  Surgeon: Lin Landsman, MD;  Location: [facility] ENDOSCOPY;  Service: Gastroenterology;  Laterality: N/A;   HERNIA REPAIR     LAPAROSCOPIC SALPINGO OOPHERECTOMY Right 01/01/2020   Procedure: LAPAROSCOPIC SALPINGO OOPHORECTOMY;  Surgeon: Schermerhorn, Gwen Her, MD;  Location: [facility] ORS;  Service: Gynecology;  Laterality: Right;   LYSIS OF ADHESION N/A 01/01/2020   Procedure: LYSIS OF ADHESION;  Surgeon: Schermerhorn, Gwen Her, MD;  Location: [facility] ORS;  Service: Gynecology;  Laterality: N/A;   RADICAL HYSTERECTOMY  04/14/2017   SALPINGECTOMY Bilateral 04/14/2017   SENTINEL LYMPH NODE BIOPSY     TUBAL LIGATION       MEDICATIONS:  Prior to Admission medications   Medication Sig Start Date End Date Taking? Authorizing Provider  albuterol (PROVENTIL HFA;VENTOLIN HFA) 108 (90 Base) MCG/ACT inhaler Inhale 2 puffs into the lungs every 6 (six) hours as needed for wheezing or shortness of breath. 02/28/18  Yes Merlyn Lot, MD  amLODipine (NORVASC) 5 MG tablet Take 1 tablet (5 mg total) by mouth daily. 11/28/20 01/27/21 Yes End, Harrell Gave, MD  aspirin EC 81 MG tablet Take 1 tablet (81 mg total) by mouth daily. Swallow whole. 11/28/20  Yes End, Harrell Gave, MD  gabapentin (NEURONTIN) 300 MG capsule Take 300 mg by mouth at bedtime.   Yes [provider]  lisinopril (ZESTRIL) 40 MG tablet Take 1 tablet (40 mg total) by mouth every morning. 03/24/20  Yes Sable Feil, PA-C  metoprolol succinate (TOPROL-XL) 25 MG 24 hr tablet Take 25 mg by mouth daily. 12/01/20  Yes [provider]  naproxen (NAPROSYN) 500 MG tablet Take 1 tablet (500 mg total) by mouth 2 (two) times daily with a meal. 03/24/20  Yes Sable Feil, PA-C  metoprolol succinate (TOPROL-XL) 50 MG 24 hr tablet Take 50 mg by mouth daily. Patient not taking: Reported on 12/04/2020 11/13/20   [provider]   metoprolol tartrate (LOPRESSOR) 50 MG tablet Take 1 tablet (50 mg total) by mouth once for 1 dose. Take TWO hours prior to CT procedure 11/28/20 11/28/20  End, Harrell Gave, MD     ALLERGIES:  No Known Allergies   SOCIAL HISTORY:  Social History   Socioeconomic History   Marital status: Single    Spouse name: Not on file   Number of children: 3   Years of education: Not on file   Highest education level: Not on file  Occupational History   Not on file  Tobacco Use   Smoking status: Some Days    Packs/day: 0.50    Years: 12.00    Pack years: 6.00    Types: Cigarettes   Smokeless tobacco: Never   Tobacco comments:    11/28/2020 last smoked cigarettes 10 days ago  Vaping Use   Vaping Use: Never used  Substance and Sexual Activity   Alcohol use: Yes    Comment: occassionally; less than 1/month   Drug use: Yes    Frequency: 1.0 times per week    Types: Marijuana   Sexual activity: Yes    Partners: Male  Other Topics Concern   Not on file  Social History Narrative   Not on file   Social Determinants of Health   Financial Resource Strain: Not on file  Food Insecurity: Not on file  Transportation Needs: Not on file  Physical Activity: Not on file  Stress: Not on file  Social Connections: Not on file  Intimate Partner Violence: Not on file     FAMILY HISTORY:  Family History  Problem Relation Age of Onset   Hypertension Mother    Gallstones Mother    Hypertension Father    Gout Father    Cancer Paternal Uncle        lung   Cancer Maternal Grandmother        not sure what type of cancer   Cancer Maternal Grandfather        not sure what type of cancer   Cancer Paternal Grandmother        not sure what type of cancer   Cancer Paternal Grandfather        not sure what type of cancer      REVIEW OF SYSTEMS:  Review of Systems  Constitutional:  Negative for chills and fever.  HENT:  Negative for congestion and sore throat.   Respiratory:  Negative for cough  and shortness of breath.   Cardiovascular:  Negative for chest pain and palpitations.  Gastrointestinal:  Positive for abdominal pain (resolved) and blood in stool. Negative for nausea and vomiting.  Genitourinary:  Negative for dysuria and urgency.  All other systems reviewed and are negative.  VITAL SIGNS:  Temp:  [98.4 F (36.9 C)-99 F (37.2 C)] 98.9 F (37.2 C) (11/17 1143) Pulse Rate:  [93-105] 98 (11/17 1143) Resp:  [15-18] 18 (11/17 1143) BP: (107-124)/(55-84) 123/73 (11/17 1143) SpO2:  [99 %-100 %] 100 % (11/17 1143)     Height: 5\' 5"  (165.1 cm) Weight: 101.2 kg BMI (Calculated): 37.11   INTAKE/OUTPUT:  11/16 0701 - 11/17 0700 In: 93 [P.O.:720] Out: -   PHYSICAL EXAM:  Physical Exam Vitals and nursing note reviewed. Exam conducted with a chaperone present.  Constitutional:      General: She is not in acute distress.    Appearance: Normal appearance. She is normal weight. She is not ill-appearing.  HENT:     Head: Normocephalic and atraumatic.  Eyes:     General: No scleral icterus.    Conjunctiva/sclera: Conjunctivae normal.  Cardiovascular:     Rate and Rhythm: Normal rate.     Pulses: Normal pulses.  Pulmonary:     Effort: Pulmonary effort is normal. No respiratory distress.  Abdominal:     General: Abdomen is flat. A surgical scar is present.     Tenderness: There is no abdominal tenderness. There is no guarding or rebound.     Comments: Abdomen is soft, non-tender, non-distended, no rebound/guarding. She does have multiple abdominal scars consistent with surgical history  Genitourinary:    Comments: Deferred, patient being prepped for vascular procedure  Skin:    General: Skin is warm and dry.     Findings: No erythema.  Neurological:     General: No focal deficit present.     Mental Status: She is alert and oriented to person, place, and time.  Psychiatric:        Mood and Affect: Mood normal.        Behavior: Behavior normal.     Labs:  CBC  Latest Ref Rng & Units 12/11/2020 12/11/2020  12/10/2020  WBC 4.0 - 10.5 K/uL - 10.7(H) 12.8(H)  Hemoglobin 12.0 - 15.0 g/dL 7.7(L) 7.0(L) 8.6(L)  Hematocrit 36.0 - 46.0 % - 20.4(L) 25.0(L)  Platelets 150 - 400 K/uL - 324 380   CMP Latest Ref Rng & Units 12/11/2020 12/08/2020 12/05/2020  Glucose 70 - 99 mg/dL 98 101(H) 98  BUN 6 - 20 mg/dL 13 8 16   Creatinine 0.44 - 1.00 mg/dL 0.97 0.79 0.91  Sodium 135 - 145 mmol/L 138 139 139  Potassium 3.5 - 5.1 mmol/L 3.3(L) 3.6 3.7  Chloride 98 - 111 mmol/L 108 108 109  CO2 22 - 32 mmol/L 25 24 25   Calcium 8.9 - 10.3 mg/dL 8.3(L) 8.4(L) 8.0(L)  Total Protein 6.5 - 8.1 g/dL - - -  Total Bilirubin 0.3 - 1.2 mg/dL - - -  Alkaline Phos 38 - 126 U/L - - -  AST 15 - 41 U/L - - -  ALT 0 - 44 U/L - - -     Imaging studies:   CTA Abdomen/Pelvis (12/04/2020) personally reviewed no evidence of contrast extravasation or gross bleeding,  no other acute identified intra-abdominal processes, and radiologist report reviewed below:  IMPRESSION: VASCULAR 1. No CT evidence of an active bleed. 2. No acute vascular abnormality. 3.  Aortic Atherosclerosis (ICD10-I70.0).   NON-VASCULAR 1. No acute intra-abdominal or intrapelvic abnormality.  CTA Abdomen/Pelvis (12/06/2020) personally reviewed no evidence of contrast extravasation or gross bleeding,  no other acute identified intra-abdominal processes, and radiologist report reviewed below:  IMPRESSION: No evidence of active GI bleeding.   No bowel wall thickening or mass is evident on CT.  Tagged RBC Scan (12/07/2020) personally reviewed and agree with radiologist report reviewed below:  IMPRESSION: No evidence of active GI bleed.  Capsule endoscopy (11/13) report reviewed:  Study is complete Capsule retained in stomach for 5 hours, but reached cecum Small bowel transit time 2hrs No active bleeding source identified in the small bowel Blood in entire colon beginning in cecum   Meckel's scan (11/14)  personally reviewed and agree with radiologist report reviewed below:  IMPRESSION: No scintigraphic evidence of ectopic gastric mucosa (Meckel's diverticulum).  CT Enterography Abdomen/pelvis (12/10/2020) personally reviewed and again without obvious signs of bleeding, no other significant intra-abdominal findings, and radiologist report reviewed:  IMPRESSION: 1. No CT findings of the abdomen or pelvis to explain or localize upper GI bleed. No evidence of intraluminal contrast extravasation. 2. Status post cholecystectomy and hysterectomy. 3. Aortic atherosclerosis, advanced for patient age and gender.    Assessment/Plan: (ICD-10's: K44.2) 45 y.o. female with continued anemia despite transfusions and continue bright red blood per rectum concerning for GI bleed with difficulties in localization of source.   - Appreciate vascular surgery assistance and willingness to do angiography today. Pending findings, may need repeat colonoscopy and endoscopy with push enteroscopy  From a surgical perspective, we will need to localize the bleeding before any surgical intervention or resection can occur ideally.  - Monitor H&H; trend; transfuse <7 - Monitor abdominal examination; on-going bowel function   - Further management per primary and consulting services; we will follow and be readily available   All of the above findings and recommendations were discussed with the patient, and all of patient's questions were answered to her expressed satisfaction.  Thank you for the opportunity to participate in this patient's care.   -- Edison Simon, PA-C [facility] 12/11/2020, 12:56 PM [phone] M-F: 7am - 4pm

## 2020-12-11 NOTE — Progress Notes (Signed)
PROGRESS NOTE    Cassidy Mathis  SEG:[id number] DOB: 01/08/1976 DOA: 12/04/2020 PCP: [facility] [place]   Chief complaint.  GI bleed. Brief Narrative:  Cassidy Mathis is a 45 y.o. female with medical history significant for hypertension, NSAID use, who presented to the hospital with large-volume rectal bleeding (bright red blood per rectum).  She felt dizzy after she had multiple bloody stools.     She was admitted to the hospital for rectal bleeding complicated by acute blood loss anemia.  She was treated with IV fluids, IV Protonix and was transfused with 3 units of packed red blood cells.  She was treated with IV fluids, IV Protonix.  EGD, colonoscopy, RBC tagged scan and video capsule endoscopy failed to reveal the etiology of rectal bleeding.  Meckel scan performed on 11/16 did not show any Meckel diverticulosis.   Assessment & Plan:   Principal Problem:   Rectal bleeding Active Problems:   Tobacco use   Uncontrolled hypertension   Vitamin B12 deficiency   Acute blood loss anemia  Acute blood loss anemia secondary to rectal bleeding. Rectal bleeding. B12 deficiency. Patient had a more rectal bleeding last night.  The stool was maroon color.  Hemoglobin dropped down to 7.0, then back up to 7.7.  Patient has adequate iron B12 level.  CT angiogram again did not identify the source of bleeding. Discussed with GI, Vanga, she is planning for repeating a colonoscopy.  Also obtain consult from vascular surgery. Continue monitor hemoglobin and transfuse as needed.  Essential hypertension Hold off blood pressure medicine.  Hypokalemia. Repleted.    DVT prophylaxis: SCDs Code Status: full Family Communication:  Disposition Plan:      Status is: Inpatient   Remains inpatient appropriate because: Severity of disease and continued work-up.       I/O last 3 completed shifts: In: 1093.8 [P.O.:957; IV Piggyback:136.8] Out: -  No intake/output data  recorded.     Consultants:  GI and vascular surgeon.  Procedures:   Antimicrobials: None  Subjective: Patient had multiple bloody stools overnight, maroon-colored.  No abdominal pain or nausea vomiting. No dizziness or lightheadedness. Denies any short of breath or cough. No fever or chills.  Objective: Vitals:   12/10/20 1610 12/10/20 2058 12/11/20 0037 12/11/20 0751  BP: 124/84 (!) 107/55 122/75 119/73  Pulse: (!) 101 (!) 105 93 96  Resp: 18 18 18 15   Temp: 98.5 F (36.9 C) 98.4 F (36.9 C) 99 F (37.2 C) 98.8 F (37.1 C)  TempSrc: Oral Oral Oral Oral  SpO2: 100% 99% 100% 99%  Weight:      Height:        Intake/Output Summary (Last 24 hours) at 12/11/2020 1036 Last data filed at 12/11/2020 0100 Gross per 24 hour  Intake 720 ml  Output --  Net 720 ml   Filed Weights   12/04/20 1850  Weight: 101.2 kg    Examination:  General exam: Appears calm and comfortable  Respiratory system: Clear to auscultation. Respiratory effort normal. Cardiovascular system: S1 & S2 heard, RRR. No JVD, murmurs, rubs, gallops or clicks. No pedal edema. Gastrointestinal system: Abdomen is nondistended, soft and nontender. No organomegaly or masses felt. Normal bowel sounds heard. Central nervous system: Alert and oriented. No focal neurological deficits. Extremities: Symmetric 5 x 5 power. Skin: No rashes, lesions or ulcers Psychiatry: Judgement and insight appear normal. Mood & affect appropriate.     Data Reviewed: I have personally reviewed following labs and imaging studies  CBC: Recent Labs  Lab 12/06/20 1700 12/07/20 0756 12/08/20 0246 12/08/20 1526 12/08/20 2155 12/09/20 0632 12/10/20 1137 12/11/20 0511 12/11/20 0856  WBC 8.3  --  9.4  --   --  11.1* 12.8* 10.7*  --   NEUTROABS  --   --  6.0  --   --  7.9*  --  6.8  --   HGB 8.3*   < > 9.4* 9.3* 8.8* 8.2* 8.6* 7.0* 7.7*  HCT 24.0*   < > 26.5* 26.6* 25.4* 23.1* 25.0* 20.4*  --   MCV 88.2  --  86.9  --   --   85.9 89.0 88.7  --   PLT 274  --  238  --   --  283 380 324  --    < > = values in this interval not displayed.   Basic Metabolic Panel: Recent Labs  Lab 12/04/20 1853 12/05/20 0612 12/08/20 0246 12/11/20 0511  NA 137 139 139 138  K 3.7 3.7 3.6 3.3*  CL 105 109 108 108  CO2 25 25 24 25   GLUCOSE 103* 98 101* 98  BUN 19 16 8 13   CREATININE 0.94 0.91 0.79 0.97  CALCIUM 8.9 8.0* 8.4* 8.3*  MG  --   --  2.1  --   PHOS  --   --  3.4  --    GFR: Estimated Creatinine Clearance: 87.3 mL/min (by C-G formula based on SCr of 0.97 mg/dL). Liver Function Tests: Recent Labs  Lab 12/04/20 1853  AST 18  ALT 19  ALKPHOS 59  BILITOT 0.8  PROT 7.3  ALBUMIN 3.6   Recent Labs  Lab 12/04/20 1853  LIPASE 87*   No results for input(s): AMMONIA in the last 168 hours. Coagulation Profile: Recent Labs  Lab 12/04/20 2151  INR 1.0   Cardiac Enzymes: No results for input(s): CKTOTAL, CKMB, CKMBINDEX, TROPONINI in the last 168 hours. BNP (last 3 results) No results for input(s): PROBNP in the last 8760 hours. HbA1C: No results for input(s): HGBA1C in the last 72 hours. CBG: Recent Labs  Lab 12/05/20 1834 12/05/20 2038  GLUCAP 130* 103*   Lipid Profile: No results for input(s): CHOL, HDL, LDLCALC, TRIG, CHOLHDL, LDLDIRECT in the last 72 hours. Thyroid Function Tests: No results for input(s): TSH, T4TOTAL, FREET4, T3FREE, THYROIDAB in the last 72 hours. Anemia Panel: Recent Labs    12/10/20 1334  VITAMINB12 1,812*  TIBC 337  IRON 79   Sepsis Labs: No results for input(s): PROCALCITON, LATICACIDVEN in the last 168 hours.  Recent Results (from the past 240 hour(s))  Resp Panel by RT-PCR (Flu A&B, Covid) Nasopharyngeal Swab     Status: None   Collection Time: 12/04/20  9:51 PM   Specimen: Nasopharyngeal Swab; Nasopharyngeal(NP) swabs in vial transport medium  Result Value Ref Range Status   SARS Coronavirus 2 by RT PCR NEGATIVE NEGATIVE Final    Comment: (NOTE) SARS-CoV-2  target nucleic acids are NOT DETECTED.  The SARS-CoV-2 RNA is generally detectable in upper respiratory specimens during the acute phase of infection. The lowest concentration of SARS-CoV-2 viral copies this assay can detect is 138 copies/mL. A negative result does not preclude SARS-Cov-2 infection and should not be used as the sole basis for treatment or other patient management decisions. A negative result may occur with  improper specimen collection/handling, submission of specimen other than nasopharyngeal swab, presence of viral mutation(s) within the areas targeted by this assay, and inadequate number of viral copies(<138 copies/mL). A  negative result must be combined with clinical observations, patient history, and epidemiological information. The expected result is Negative.  Fact Sheet for Patients:  EntrepreneurPulse.com.au  Fact Sheet for Healthcare Providers:  IncredibleEmployment.be  This test is no t yet approved or cleared by the [place] FDA and  has been authorized for detection and/or diagnosis of SARS-CoV-2 by FDA under an Emergency Use Authorization (EUA). This EUA will remain  in effect (meaning this test can be used) for the duration of the COVID-19 declaration under Section 564(b)(1) of the Act, 21 U.S.C.section 360bbb-3(b)(1), unless the authorization is terminated  or revoked sooner.       Influenza A by PCR NEGATIVE NEGATIVE Final   Influenza B by PCR NEGATIVE NEGATIVE Final    Comment: (NOTE) The Xpert Xpress SARS-CoV-2/FLU/RSV plus assay is intended as an aid in the diagnosis of influenza from Nasopharyngeal swab specimens and should not be used as a sole basis for treatment. Nasal washings and aspirates are unacceptable for Xpert Xpress SARS-CoV-2/FLU/RSV testing.  Fact Sheet for Patients: EntrepreneurPulse.com.au  Fact Sheet for Healthcare  Providers: IncredibleEmployment.be  This test is not yet approved or cleared by the [place] FDA and has been authorized for detection and/or diagnosis of SARS-CoV-2 by FDA under an Emergency Use Authorization (EUA). This EUA will remain in effect (meaning this test can be used) for the duration of the COVID-19 declaration under Section 564(b)(1) of the Act, 21 U.S.C. section 360bbb-3(b)(1), unless the authorization is terminated or revoked.  Performed at [facility], [address] [place], [place] [address]   MRSA Next Gen by PCR, Nasal     Status: None   Collection Time: 12/05/20  8:50 PM   Specimen: Nasal Mucosa; Nasal Swab  Result Value Ref Range Status   MRSA by PCR Next Gen NOT DETECTED NOT DETECTED Final    Comment: (NOTE) The GeneXpert MRSA Assay (FDA approved for NASAL specimens only), is one component of a comprehensive MRSA colonization surveillance program. It is not intended to diagnose MRSA infection nor to guide or monitor treatment for MRSA infections. Test performance is not FDA approved in patients less than 72 years old. Performed at [facility], [address], [place], [place] [address]          Radiology Studies: NM Bowel Img Meckels  Result Date: 12/10/2020 CLINICAL DATA:  Lower gastrointestinal bleed. Concern for Meckel's diverticulum. EXAM: NUCLEAR MEDICINE BOWEL (MECKEL'S) SCAN TECHNIQUE: Sequential abdominal images were obtained following intravenous injection of radiopharmaceutical. RADIOPHARMACEUTICALS:  10.9 mCi Tc-68m pertechnetate IV COMPARISON:  CT 11/26/2020, GI bleed scan 12/07/2020 FINDINGS: No accumulation of radiotracer to localize ectopic gastric mucosa (Meckel's diverticulum). Physiologic activity noted within the stomach and leaching into the proximal small bowel over time. IMPRESSION: No scintigraphic evidence of ectopic gastric mucosa (Meckel's diverticulum). Electronically Signed   By:  Suzy Bouchard M.D.   On: 12/10/2020 09:29   US Venous Img Upper Uni Right(DVT)  Result Date: 12/10/2020 CLINICAL DATA:  Right upper extremity pain and edema following peripheral IV removal. EXAM: RIGHT UPPER EXTREMITY VENOUS DOPPLER ULTRASOUND TECHNIQUE: Gray-scale sonography with graded compression, as well as color Doppler and duplex ultrasound were performed to evaluate the upper extremity deep venous system from the level of the subclavian vein and including the jugular, axillary, basilic, radial, ulnar and upper cephalic vein. Spectral Doppler was utilized to evaluate flow at rest and with distal augmentation maneuvers. COMPARISON:  None. FINDINGS: Contralateral Subclavian Vein: Respiratory phasicity is normal and symmetric with the  symptomatic side. No evidence of thrombus. Normal compressibility. Internal Jugular Vein: No evidence of thrombus. Normal compressibility, respiratory phasicity and response to augmentation. Subclavian Vein: No evidence of thrombus. Normal compressibility, respiratory phasicity and response to augmentation. Axillary Vein: No evidence of thrombus. Normal compressibility, respiratory phasicity and response to augmentation. Cephalic Vein: There is hypoechoic occlusive thrombus involving the cephalic vein at the level of the forearm to the antecubital fossa, reportedly at the location of recently removed peripheral IV (images 32 two hundred thirty-four). Basilic Vein: No evidence of thrombus. Normal compressibility, respiratory phasicity and response to augmentation. Brachial Veins: No evidence of thrombus. Normal compressibility, respiratory phasicity and response to augmentation. Radial Veins: No evidence of thrombus. Normal compressibility, respiratory phasicity and response to augmentation. Ulnar Veins: No evidence of thrombus. Normal compressibility, respiratory phasicity and response to augmentation. Other Findings:  None visualized. IMPRESSION: 1. No evidence of DVT  within the right upper extremity. 2. Examination is positive for short-segment occlusive SVT involving the cephalic vein at the level of the forearm and antecubital fossa, reportedly at the location of recently removed peripheral IV. Again, there is no extension of this peripheral SVT to the deep venous system of the right upper extremity. Electronically Signed   By: Sandi Mariscal M.D.   On: 12/10/2020 11:24   CT ENTERO ABD/PELVIS W CONTAST  Result Date: 12/11/2020 CLINICAL DATA:  Upper GI bleed, non variceal, negative endoscopy EXAM: CT ABDOMEN AND PELVIS WITH CONTRAST (ENTEROGRAPHY) TECHNIQUE: Multidetector CT of the abdomen and pelvis during bolus administration of intravenous contrast. Negative oral contrast was given. CONTRAST:  132mL OMNIPAQUE IOHEXOL 300 MG/ML SOLN, additional negative oral enteric contrast COMPARISON:  12/06/2020 FINDINGS: Lower chest: No acute abnormality. Hepatobiliary: No focal liver abnormality is seen. Status post cholecystectomy. No biliary dilatation. Pancreas: Unremarkable. No pancreatic ductal dilatation or surrounding inflammatory changes. Spleen: Normal in size without significant abnormality. Adrenals/Urinary Tract: Adrenal glands are unremarkable. Kidneys are normal, without renal calculi, solid lesion, or hydronephrosis. Bladder is unremarkable. Stomach/Bowel: Stomach is within normal limits. Appendix appears normal. No evidence of bowel wall thickening, distention, or inflammatory changes. Vascular/Lymphatic: Mixed aortic atherosclerosis. No enlarged abdominal or pelvic lymph nodes. Reproductive: Status post hysterectomy. Other: No abdominal wall hernia or abnormality. No abdominopelvic ascites. Musculoskeletal: No acute or significant osseous findings. IMPRESSION: 1. No CT findings of the abdomen or pelvis to explain or localize upper GI bleed. No evidence of intraluminal contrast extravasation. 2. Status post cholecystectomy and hysterectomy. 3. Aortic atherosclerosis,  advanced for patient age and gender. Aortic Atherosclerosis (ICD10-I70.0). Electronically Signed   By: Delanna Ahmadi M.D.   On: 12/11/2020 07:55        Scheduled Meds:  sodium chloride   Intravenous Once   feeding supplement  237 mL Oral BID BM   vitamin B-12  1,000 mcg Oral Daily   Continuous Infusions:  sodium chloride       LOS: 7 days    Time spent: 27 minutes    Sharen Hones, MD [facility]   To contact the attending provider between 7A-7P or the covering provider during after hours 7P-7A, please log into the web site [email or web address] and access using universal [place] password for that web site. If you do not have the password, please call the hospital operator.  12/11/2020, 10:36 AM

## 2020-12-11 NOTE — Progress Notes (Signed)
Cassidy Antigua, MD [address] [place], [place], [place], [address] [address], [place], [place], [place], [address] Phone: [phone]  Fax: [phone]   Subjective:  Patient resting in bed comfortably.  Reports that bowel movement today was solid but burgundy colored.  Bowel movements prior to this or liquidy red.  No abdominal pain, nausea or vomiting.  Objective: Exam: Vital signs in last 24 hours: Vitals:   12/11/20 0037 12/11/20 0751 12/11/20 1143 12/11/20 1329  BP: 122/75 119/73 123/73 132/77  Pulse: 93 96 98 (!) 103  Resp: 18 15 18 20   Temp: 99 F (37.2 C) 98.8 F (37.1 C) 98.9 F (37.2 C) 98.4 F (36.9 C)  TempSrc: Oral Oral Oral Oral  SpO2: 100% 99% 100% 100%  Weight:    101.2 kg  Height:    5\' 5"  (1.651 m)   Weight change:   Intake/Output Summary (Last 24 hours) at 12/11/2020 1447 Last data filed at 12/11/2020 0100 Gross per 24 hour  Intake 720 ml  Output --  Net 720 ml    Constitutional: General:   Alert,  Well-developed, well-nourished, pleasant and cooperative in NAD BP 132/77   Pulse (!) 103   Temp 98.4 F (36.9 C) (Oral)   Resp 20   Ht 5\' 5"  (1.651 m)   Wt 101.2 kg   LMP 03/18/2017 (Exact Date)   SpO2 100%   BMI 37.11 kg/m   Eyes:  Sclera clear, no icterus.   Conjunctiva pink.   Ears:  No scars, lesions or masses, Normal auditory acuity. Nose:  No deformity, discharge, or lesions. Mouth:  No deformity or lesions, oropharynx pink & moist.  Neck:  Supple; no masses, trachea midline  Respiratory: Normal respiratory effort  Gastrointestinal:  Soft, non-tender and non-distended without masses, hepatosplenomegaly or hernias noted.  No guarding or rebound tenderness.     Cardiac: No clubbing or edema.  No cyanosis. Normal posterior tibial pedal pulses noted.  Lymphatic:  No significant cervical adenopathy.  Psych:  Alert and cooperative. Normal mood and affect.  Musculoskeletal:  Head normocephalic, atraumatic. 5/5 Lower  extremity strength bilaterally.  Skin: Warm. Intact without significant lesions or rashes. No jaundice.  Neurologic:  Face symmetrical, tongue midline, Normal sensation to touch  Psych:  Alert and oriented x3, Alert and cooperative. Normal mood and affect.   Lab Results: Lab Results  Component Value Date   WBC 10.7 (H) 12/11/2020   HGB 7.7 (L) 12/11/2020   HCT 20.4 (L) 12/11/2020   MCV 88.7 12/11/2020   PLT 324 12/11/2020   Micro Results: Recent Results (from the past 240 hour(s))  Resp Panel by RT-PCR (Flu A&B, Covid) Nasopharyngeal Swab     Status: None   Collection Time: 12/04/20  9:51 PM   Specimen: Nasopharyngeal Swab; Nasopharyngeal(NP) swabs in vial transport medium  Result Value Ref Range Status   SARS Coronavirus 2 by RT PCR NEGATIVE NEGATIVE Final    Comment: (NOTE) SARS-CoV-2 target nucleic acids are NOT DETECTED.  The SARS-CoV-2 RNA is generally detectable in upper respiratory specimens during the acute phase of infection. The lowest concentration of SARS-CoV-2 viral copies this assay can detect is 138 copies/mL. A negative result does not preclude SARS-Cov-2 infection and should not be used as the sole basis for treatment or other patient management decisions. A negative result may occur with  improper specimen collection/handling, submission of specimen other than nasopharyngeal swab, presence of viral mutation(s) within the areas targeted by this assay, and inadequate number of  viral copies(<138 copies/mL). A negative result must be combined with clinical observations, patient history, and epidemiological information. The expected result is Negative.  Fact Sheet for Patients:  EntrepreneurPulse.com.au  Fact Sheet for Healthcare Providers:  IncredibleEmployment.be  This test is no t yet approved or cleared by the [place] FDA and  has been authorized for detection and/or diagnosis of SARS-CoV-2 by FDA under an  Emergency Use Authorization (EUA). This EUA will remain  in effect (meaning this test can be used) for the duration of the COVID-19 declaration under Section 564(b)(1) of the Act, 21 U.S.C.section 360bbb-3(b)(1), unless the authorization is terminated  or revoked sooner.       Influenza A by PCR NEGATIVE NEGATIVE Final   Influenza B by PCR NEGATIVE NEGATIVE Final    Comment: (NOTE) The Xpert Xpress SARS-CoV-2/FLU/RSV plus assay is intended as an aid in the diagnosis of influenza from Nasopharyngeal swab specimens and should not be used as a sole basis for treatment. Nasal washings and aspirates are unacceptable for Xpert Xpress SARS-CoV-2/FLU/RSV testing.  Fact Sheet for Patients: EntrepreneurPulse.com.au  Fact Sheet for Healthcare Providers: IncredibleEmployment.be  This test is not yet approved or cleared by the [place] FDA and has been authorized for detection and/or diagnosis of SARS-CoV-2 by FDA under an Emergency Use Authorization (EUA). This EUA will remain in effect (meaning this test can be used) for the duration of the COVID-19 declaration under Section 564(b)(1) of the Act, 21 U.S.C. section 360bbb-3(b)(1), unless the authorization is terminated or revoked.  Performed at [facility], [address] [place], [place] [address]   MRSA Next Gen by PCR, Nasal     Status: None   Collection Time: 12/05/20  8:50 PM   Specimen: Nasal Mucosa; Nasal Swab  Result Value Ref Range Status   MRSA by PCR Next Gen NOT DETECTED NOT DETECTED Final    Comment: (NOTE) The GeneXpert MRSA Assay (FDA approved for NASAL specimens only), is one component of a comprehensive MRSA colonization surveillance program. It is not intended to diagnose MRSA infection nor to guide or monitor treatment for MRSA infections. Test performance is not FDA approved in patients less than 58 years old. Performed at [facility], [address] [place], [place] [address]    Studies/Results: [place] Bowel Img Meckels  Result Date: 12/10/2020 CLINICAL DATA:  Lower gastrointestinal bleed. Concern for Meckel's diverticulum. EXAM: NUCLEAR MEDICINE BOWEL (MECKEL'S) SCAN TECHNIQUE: Sequential abdominal images were obtained following intravenous injection of radiopharmaceutical. RADIOPHARMACEUTICALS:  10.9 mCi Tc-57m pertechnetate IV COMPARISON:  CT 11/26/2020, GI bleed scan 12/07/2020 FINDINGS: No accumulation of radiotracer to localize ectopic gastric mucosa (Meckel's diverticulum). Physiologic activity noted within the stomach and leaching into the proximal small bowel over time. IMPRESSION: No scintigraphic evidence of ectopic gastric mucosa (Meckel's diverticulum). Electronically Signed   By: Suzy Bouchard M.D.   On: 12/10/2020 09:29   US Venous Img Upper Uni Right(DVT)  Result Date: 12/10/2020 CLINICAL DATA:  Right upper extremity pain and edema following peripheral IV removal. EXAM: RIGHT UPPER EXTREMITY VENOUS DOPPLER ULTRASOUND TECHNIQUE: Gray-scale sonography with graded compression, as well as color Doppler and duplex ultrasound were performed to evaluate the upper extremity deep venous system from the level of the subclavian vein and including the jugular, axillary, basilic, radial, ulnar and upper cephalic vein. Spectral Doppler was utilized to evaluate flow at rest and with distal augmentation maneuvers. COMPARISON:  None. FINDINGS: Contralateral Subclavian Vein: Respiratory phasicity is normal and symmetric with the symptomatic side. No  evidence of thrombus. Normal compressibility. Internal Jugular Vein: No evidence of thrombus. Normal compressibility, respiratory phasicity and response to augmentation. Subclavian Vein: No evidence of thrombus. Normal compressibility, respiratory phasicity and response to augmentation. Axillary Vein: No evidence of thrombus. Normal compressibility, respiratory phasicity and response to augmentation.  Cephalic Vein: There is hypoechoic occlusive thrombus involving the cephalic vein at the level of the forearm to the antecubital fossa, reportedly at the location of recently removed peripheral IV (images 32 two hundred thirty-four). Basilic Vein: No evidence of thrombus. Normal compressibility, respiratory phasicity and response to augmentation. Brachial Veins: No evidence of thrombus. Normal compressibility, respiratory phasicity and response to augmentation. Radial Veins: No evidence of thrombus. Normal compressibility, respiratory phasicity and response to augmentation. Ulnar Veins: No evidence of thrombus. Normal compressibility, respiratory phasicity and response to augmentation. Other Findings:  None visualized. IMPRESSION: 1. No evidence of DVT within the right upper extremity. 2. Examination is positive for short-segment occlusive SVT involving the cephalic vein at the level of the forearm and antecubital fossa, reportedly at the location of recently removed peripheral IV. Again, there is no extension of this peripheral SVT to the deep venous system of the right upper extremity. Electronically Signed   By: Sandi Mariscal M.D.   On: 12/10/2020 11:24   CT ENTERO ABD/PELVIS W CONTAST  Result Date: 12/11/2020 CLINICAL DATA:  Upper GI bleed, non variceal, negative endoscopy EXAM: CT ABDOMEN AND PELVIS WITH CONTRAST (ENTEROGRAPHY) TECHNIQUE: Multidetector CT of the abdomen and pelvis during bolus administration of intravenous contrast. Negative oral contrast was given. CONTRAST:  160mL OMNIPAQUE IOHEXOL 300 MG/ML SOLN, additional negative oral enteric contrast COMPARISON:  12/06/2020 FINDINGS: Lower chest: No acute abnormality. Hepatobiliary: No focal liver abnormality is seen. Status post cholecystectomy. No biliary dilatation. Pancreas: Unremarkable. No pancreatic ductal dilatation or surrounding inflammatory changes. Spleen: Normal in size without significant abnormality. Adrenals/Urinary Tract: Adrenal  glands are unremarkable. Kidneys are normal, without renal calculi, solid lesion, or hydronephrosis. Bladder is unremarkable. Stomach/Bowel: Stomach is within normal limits. Appendix appears normal. No evidence of bowel wall thickening, distention, or inflammatory changes. Vascular/Lymphatic: Mixed aortic atherosclerosis. No enlarged abdominal or pelvic lymph nodes. Reproductive: Status post hysterectomy. Other: No abdominal wall hernia or abnormality. No abdominopelvic ascites. Musculoskeletal: No acute or significant osseous findings. IMPRESSION: 1. No CT findings of the abdomen or pelvis to explain or localize upper GI bleed. No evidence of intraluminal contrast extravasation. 2. Status post cholecystectomy and hysterectomy. 3. Aortic atherosclerosis, advanced for patient age and gender. Aortic Atherosclerosis (ICD10-I70.0). Electronically Signed   By: Delanna Ahmadi M.D.   On: 12/11/2020 07:55   Medications:  Scheduled Meds:  fentaNYL       heparin sodium (porcine)       midazolam       [MAR Hold] feeding supplement  237 mL Oral BID BM   midazolam       protamine  50 mg Intravenous Once   [MAR Hold] vitamin B-12  1,000 mcg Oral Daily   Continuous Infusions:  sodium chloride Stopped (12/11/20 1344)   [MAR Hold] sodium chloride 10 mL/hr at 12/11/20 1147   sodium chloride 75 mL/hr at 12/11/20 1345   ceFAZolin     [START ON 12/12/2020]  ceFAZolin (ANCEF) IV     PRN Meds:.[MAR Hold] sodium chloride, [MAR Hold] acetaminophen **OR** [MAR Hold] acetaminophen, [MAR Hold] albuterol, diphenhydrAMINE, famotidine, [MAR Hold] hydrALAZINE, [MAR Hold]  HYDROmorphone (DILAUDID) injection, methylPREDNISolone (SOLU-MEDROL) injection, [MAR Hold] ondansetron **OR** [MAR Hold] ondansetron (ZOFRAN) IV, [MAR Hold] ondansetron (  ZOFRAN) IV   Assessment: Principal Problem:   Rectal bleeding Active Problems:   Tobacco use   Uncontrolled hypertension   Vitamin B12 deficiency   Acute blood loss anemia    Hypokalemia    Plan: Patient's hemoglobin dropped to 7 compared to 8.6 yesterday.  Patient needed another PRBC transfusion and hemoglobin since then is 7.7.  Patient has been hemodynamically stable.  Source of bleeding remains unknown, but blood was noted in the cecum and entire colon on small bowel capsule study.  Vascular surgery and general surgery has been consulted, and vascular surgery plans on angiogram today   Bowel movements being solid today as reported by the patient, compared to liquidy red yesterday, may mean that the bleeding is resolving.  Can consider repeat EGD and or colonoscopy if bleeding continues and source remains unclear  Patient has had extensive work-up at this point.  Please see previous GI notes in this regard   LOS: 7 days   Cassidy Antigua, MD 12/11/2020, 2:47 PM

## 2020-12-12 ENCOUNTER — Encounter: Payer: Self-pay | Admitting: Vascular Surgery

## 2020-12-12 ENCOUNTER — Telehealth (HOSPITAL_COMMUNITY): Payer: Self-pay | Admitting: *Deleted

## 2020-12-12 DIAGNOSIS — R109 Unspecified abdominal pain: Secondary | ICD-10-CM

## 2020-12-12 LAB — TYPE AND SCREEN
ABO/RH(D): A POS
Antibody Screen: NEGATIVE
Unit division: 0

## 2020-12-12 LAB — PREPARE RBC (CROSSMATCH)

## 2020-12-12 LAB — BASIC METABOLIC PANEL
Anion gap: 7 (ref 5–15)
BUN: 7 mg/dL (ref 6–20)
CO2: 24 mmol/L (ref 22–32)
Calcium: 8.3 mg/dL — ABNORMAL LOW (ref 8.9–10.3)
Chloride: 107 mmol/L (ref 98–111)
Creatinine, Ser: 0.84 mg/dL (ref 0.44–1.00)
GFR, Estimated: >60 mL/min (ref >60)
Glucose, Bld: 109 mg/dL — ABNORMAL HIGH (ref 70–99)
Potassium: 3.6 mmol/L (ref 3.5–5.1)
Sodium: 138 mmol/L (ref 135–145)

## 2020-12-12 LAB — MAGNESIUM: Magnesium: 1.9 mg/dL (ref 1.7–2.4)

## 2020-12-12 LAB — HEMOGLOBIN
Hemoglobin: 7 g/dL — ABNORMAL LOW (ref 12.0–15.0)
Hemoglobin: 9.1 g/dL — ABNORMAL LOW (ref 12.0–15.0)

## 2020-12-12 LAB — BPAM RBC
Blood Product Expiration Date: 202211202359
ISSUE DATE / TIME: 202210261715
Unit Type and Rh: 9500

## 2020-12-12 MED ORDER — SIMETHICONE 80 MG PO CHEW
160.0000 mg | CHEWABLE_TABLET | Freq: Once | ORAL | Status: AC
  Administered 2020-12-12: 22:00:00 160 mg via ORAL
  Filled 2020-12-12: qty 2

## 2020-12-12 MED ORDER — BUTALBITAL-APAP-CAFFEINE 50-325-40 MG PO TABS
1.0000 | ORAL_TABLET | Freq: Four times a day (QID) | ORAL | Status: DC | PRN
  Administered 2020-12-12 – 2020-12-13 (×3): 1 via ORAL
  Filled 2020-12-12 (×3): qty 1

## 2020-12-12 MED ORDER — SIMETHICONE 80 MG PO CHEW
80.0000 mg | CHEWABLE_TABLET | Freq: Four times a day (QID) | ORAL | Status: DC | PRN
  Administered 2020-12-12 (×2): 80 mg via ORAL
  Filled 2020-12-12 (×5): qty 1

## 2020-12-12 MED ORDER — DICYCLOMINE HCL 10 MG PO CAPS
10.0000 mg | ORAL_CAPSULE | Freq: Once | ORAL | Status: DC
  Filled 2020-12-12: qty 1

## 2020-12-12 MED ORDER — SODIUM CHLORIDE 0.9% IV SOLUTION: Freq: Once | INTRAVENOUS | Status: AC

## 2020-12-12 NOTE — Progress Notes (Signed)
PROGRESS NOTE    GLADYSE Mathis  CBS:[id number] DOB: [birth year] DOA: 12/04/2020 PCP: [facility]    Brief Narrative:  Cassidy Mathis is a 45 y.o. female with medical history significant for hypertension, NSAID use, who presented to the hospital with large-volume rectal bleeding (bright red blood per rectum).  She felt dizzy after she had multiple bloody stools.     She was admitted to the hospital for rectal bleeding complicated by acute blood loss anemia.  She was treated with IV fluids, IV Protonix and was transfused with 3 units of packed red blood cells.  She was treated with IV fluids, IV Protonix.  EGD, colonoscopy, RBC tagged scan and video capsule endoscopy failed to reveal the etiology of rectal bleeding.  Meckel scan performed on 11/16 did not show any Meckel diverticulosis. CT enterogram 11/17 not show any evidence of bleeding.  Patient was seen by vascular surgery, provocative angiogram of SMA was performed, did not show any bleeding.  Microbead embolization of a 1.5 mL was performed using polyvinyl alcohol beads.   Assessment & Plan:   Principal Problem:   Rectal bleeding Active Problems:   Tobacco use   Uncontrolled hypertension   Vitamin B12 deficiency   Acute blood loss anemia   Hypokalemia  Acute blood loss anemia secondary to rectal bleeding. Rectal bleeding. B12 deficiency. Patient is status post arterial embolization in the branches of SMA.  Hemoglobin dropped down to 7.0, will transfuse 1 unit PRBC. Patient states that she has not had a bowel movement since yesterday.  We will continue to monitor for now.  GI is aware.   Essential hypertension Hold off blood pressure medicine.  Hypokalemia. Repleted.   DVT prophylaxis: SCDs Code Status: full Family Communication:  Disposition Plan:      Status is: Inpatient   Remains inpatient appropriate because: Severity of disease and continued work-up.        I/O last 3  completed shifts: In: 831.5 [P.O.:480; I.V.:262.3; IV Piggyback:89.3] Out: -  No intake/output data recorded.     Consultants:  GI, vascular, General surgery  Procedures: Embolization of SMA  Antimicrobials: None  Subjective: Patient states that that she has no bowel movement since yesterday.  No abdominal pain or nausea vomiting. No short of breath or cough. No fever or chills.  Objective: Vitals:   12/12/20 0542 12/12/20 0802 12/12/20 1036 12/12/20 1052  BP: (!) 155/90 136/77 140/85 (!) 156/88  Pulse: 95 90 94 89  Resp: 16 20 18 18   Temp: 98.7 F (37.1 C) 99.7 F (37.6 C) 99.1 F (37.3 C) 99.3 F (37.4 C)  TempSrc: Oral Oral Oral Oral  SpO2: 100% 100% 100% 100%  Weight:      Height:        Intake/Output Summary (Last 24 hours) at 12/12/2020 1220 Last data filed at 12/11/2020 1813 Gross per 24 hour  Intake 351.54 ml  Output --  Net 351.54 ml   Filed Weights   12/04/20 1850 12/11/20 1329  Weight: 101.2 kg 101.2 kg    Examination:  General exam: Appears calm and comfortable  Respiratory system: Clear to auscultation. Respiratory effort normal. Cardiovascular system: S1 & S2 heard, RRR. No JVD, murmurs, rubs, gallops or clicks. No pedal edema. Gastrointestinal system: Abdomen is nondistended, soft and nontender. No organomegaly or masses felt. Normal bowel sounds heard. Central nervous system: Alert and oriented. No focal neurological deficits. Extremities: Symmetric 5 x 5 power. Skin: No rashes, lesions or ulcers Psychiatry: Judgement  and insight appear normal. Mood & affect appropriate.     Data Reviewed: I have personally reviewed following labs and imaging studies  CBC: Recent Labs  Lab 12/06/20 1700 12/07/20 0756 12/08/20 0246 12/08/20 1526 12/08/20 2155 12/09/20 0632 12/10/20 1137 12/11/20 0511 12/11/20 0856 12/11/20 1643 12/12/20 0544  WBC 8.3  --  9.4  --   --  11.1* 12.8* 10.7*  --   --   --   NEUTROABS  --   --  6.0  --   --  7.9*   --  6.8  --   --   --   HGB 8.3*   < > 9.4* 9.3* 8.8* 8.2* 8.6* 7.0* 7.7* 7.1* 7.0*  HCT 24.0*   < > 26.5* 26.6* 25.4* 23.1* 25.0* 20.4*  --   --   --   MCV 88.2  --  86.9  --   --  85.9 89.0 88.7  --   --   --   PLT 274  --  238  --   --  283 380 324  --   --   --    < > = values in this interval not displayed.   Basic Metabolic Panel: Recent Labs  Lab 12/08/20 0246 12/11/20 0511 12/12/20 0544  NA 139 138 138  K 3.6 3.3* 3.6  CL 108 108 107  CO2 24 25 24   GLUCOSE 101* 98 109*  BUN 8 13 7   CREATININE 0.79 0.97 0.84  CALCIUM 8.4* 8.3* 8.3*  MG 2.1  --  1.9  PHOS 3.4  --   --    GFR: Estimated Creatinine Clearance: 100.8 mL/min (by C-G formula based on SCr of 0.84 mg/dL). Liver Function Tests: No results for input(s): AST, ALT, ALKPHOS, BILITOT, PROT, ALBUMIN in the last 168 hours. No results for input(s): LIPASE, AMYLASE in the last 168 hours. No results for input(s): AMMONIA in the last 168 hours. Coagulation Profile: No results for input(s): INR, PROTIME in the last 168 hours. Cardiac Enzymes: No results for input(s): CKTOTAL, CKMB, CKMBINDEX, TROPONINI in the last 168 hours. BNP (last 3 results) No results for input(s): PROBNP in the last 8760 hours. HbA1C: No results for input(s): HGBA1C in the last 72 hours. CBG: Recent Labs  Lab 12/05/20 1834 12/05/20 2038  GLUCAP 130* 103*   Lipid Profile: No results for input(s): CHOL, HDL, LDLCALC, TRIG, CHOLHDL, LDLDIRECT in the last 72 hours. Thyroid Function Tests: No results for input(s): TSH, T4TOTAL, FREET4, T3FREE, THYROIDAB in the last 72 hours. Anemia Panel: Recent Labs    12/10/20 1334  VITAMINB12 1,812*  TIBC 337  IRON 79   Sepsis Labs: No results for input(s): PROCALCITON, LATICACIDVEN in the last 168 hours.  Recent Results (from the past 240 hour(s))  Resp Panel by RT-PCR (Flu A&B, Covid) Nasopharyngeal Swab     Status: None   Collection Time: 12/04/20  9:51 PM   Specimen: Nasopharyngeal Swab;  Nasopharyngeal(NP) swabs in vial transport medium  Result Value Ref Range Status   SARS Coronavirus 2 by RT PCR NEGATIVE NEGATIVE Final    Comment: (NOTE) SARS-CoV-2 target nucleic acids are NOT DETECTED.  The SARS-CoV-2 RNA is generally detectable in upper respiratory specimens during the acute phase of infection. The lowest concentration of SARS-CoV-2 viral copies this assay can detect is 138 copies/mL. A negative result does not preclude SARS-Cov-2 infection and should not be used as the sole basis for treatment or other patient management decisions. A negative result may occur  with  improper specimen collection/handling, submission of specimen other than nasopharyngeal swab, presence of viral mutation(s) within the areas targeted by this assay, and inadequate number of viral copies(<138 copies/mL). A negative result must be combined with clinical observations, patient history, and epidemiological information. The expected result is Negative.  Fact Sheet for Patients:  EntrepreneurPulse.com.au  Fact Sheet for Healthcare Providers:  IncredibleEmployment.be  This test is no t yet approved or cleared by the [place] FDA and  has been authorized for detection and/or diagnosis of SARS-CoV-2 by FDA under an Emergency Use Authorization (EUA). This EUA will remain  in effect (meaning this test can be used) for the duration of the COVID-19 declaration under Section 564(b)(1) of the Act, 21 U.S.C.section 360bbb-3(b)(1), unless the authorization is terminated  or revoked sooner.       Influenza A by PCR NEGATIVE NEGATIVE Final   Influenza B by PCR NEGATIVE NEGATIVE Final    Comment: (NOTE) The Xpert Xpress SARS-CoV-2/FLU/RSV plus assay is intended as an aid in the diagnosis of influenza from Nasopharyngeal swab specimens and should not be used as a sole basis for treatment. Nasal washings and aspirates are unacceptable for Xpert Xpress  SARS-CoV-2/FLU/RSV testing.  Fact Sheet for Patients: EntrepreneurPulse.com.au  Fact Sheet for Healthcare Providers: IncredibleEmployment.be  This test is not yet approved or cleared by the [place] FDA and has been authorized for detection and/or diagnosis of SARS-CoV-2 by FDA under an Emergency Use Authorization (EUA). This EUA will remain in effect (meaning this test can be used) for the duration of the COVID-19 declaration under Section 564(b)(1) of the Act, 21 U.S.C. section 360bbb-3(b)(1), unless the authorization is terminated or revoked.  Performed at [facility], [address] [place], [place] [address]   MRSA Next Gen by PCR, Nasal     Status: None   Collection Time: 12/05/20  8:50 PM   Specimen: Nasal Mucosa; Nasal Swab  Result Value Ref Range Status   MRSA by PCR Next Gen NOT DETECTED NOT DETECTED Final    Comment: (NOTE) The GeneXpert MRSA Assay (FDA approved for NASAL specimens only), is one component of a comprehensive MRSA colonization surveillance program. It is not intended to diagnose MRSA infection nor to guide or monitor treatment for MRSA infections. Test performance is not FDA approved in patients less than 17 years old. Performed at [facility], [address] [place], [place] [address]          Radiology Studies: PERIPHERAL VASCULAR CATHETERIZATION  Result Date: 12/11/2020 See surgical note for result.  CT ENTERO ABD/PELVIS W CONTAST  Result Date: 12/11/2020 CLINICAL DATA:  Upper GI bleed, non variceal, negative endoscopy EXAM: CT ABDOMEN AND PELVIS WITH CONTRAST (ENTEROGRAPHY) TECHNIQUE: Multidetector CT of the abdomen and pelvis during bolus administration of intravenous contrast. Negative oral contrast was given. CONTRAST:  164mL OMNIPAQUE IOHEXOL 300 MG/ML SOLN, additional negative oral enteric contrast COMPARISON:  12/06/2020 FINDINGS: Lower chest: No acute abnormality.  Hepatobiliary: No focal liver abnormality is seen. Status post cholecystectomy. No biliary dilatation. Pancreas: Unremarkable. No pancreatic ductal dilatation or surrounding inflammatory changes. Spleen: Normal in size without significant abnormality. Adrenals/Urinary Tract: Adrenal glands are unremarkable. Kidneys are normal, without renal calculi, solid lesion, or hydronephrosis. Bladder is unremarkable. Stomach/Bowel: Stomach is within normal limits. Appendix appears normal. No evidence of bowel wall thickening, distention, or inflammatory changes. Vascular/Lymphatic: Mixed aortic atherosclerosis. No enlarged abdominal or pelvic lymph nodes. Reproductive: Status post hysterectomy. Other: No abdominal wall hernia or abnormality. No abdominopelvic ascites.  Musculoskeletal: No acute or significant osseous findings. IMPRESSION: 1. No CT findings of the abdomen or pelvis to explain or localize upper GI bleed. No evidence of intraluminal contrast extravasation. 2. Status post cholecystectomy and hysterectomy. 3. Aortic atherosclerosis, advanced for patient age and gender. Aortic Atherosclerosis (ICD10-I70.0). Electronically Signed   By: Delanna Ahmadi M.D.   On: 12/11/2020 07:55        Scheduled Meds:  sodium chloride   Intravenous Once   feeding supplement  237 mL Oral BID BM   protamine  50 mg Intravenous Once   vitamin B-12  1,000 mcg Oral Daily   Continuous Infusions:  sodium chloride Stopped (12/12/20 0948)   sodium chloride 10 mL/hr at 12/11/20 1813     LOS: 8 days    Time spent: 27 minutes    Sharen Hones, MD [facility]   To contact the attending provider between 7A-7P or the covering provider during after hours 7P-7A, please log into the web site [email or web address] and access using universal [place] password for that web site. If you do not have the password, please call the hospital operator.  12/12/2020, 12:20 PM

## 2020-12-12 NOTE — Telephone Encounter (Signed)
Reaching out to patient to offer assistance regarding upcoming cardiac imaging study; pt states she is currently admitted to the hospital and would like to re-schedule.  She is unsure when she would like to re-schedule to but informed her someone from scheduling would reach out next week. Patient verbalized understanding.   Gordy Clement RN Navigator Cardiac Imaging [facility] [phone] office [phone] cell

## 2020-12-12 NOTE — Progress Notes (Signed)
[place] Antigua, MD [address] [place], [place], [place], [address] [address], [place], [place], [place], [address] Phone: [phone]  Fax: [phone]   Subjective: Patient resting in bed.  Is tearful because she is still in the hospital and is wondering when she will be discharged.  Reports having 1 bowel movement yesterday that she states was solid and dark in color.  States it may have been dark brown or dark black.  But definitively states that she did not have any red blood as she had before when she was bleeding.  States a bowel movement prior to this was also solid.  No nausea or vomiting.  Does describe epigastric discomfort, dull, 5/10 since her vascular procedure yesterday   Objective: Exam: Vital signs in last 24 hours: Vitals:   12/12/20 0542 12/12/20 0802 12/12/20 1036 12/12/20 1052  BP: (!) 155/90 136/77 140/85 (!) 156/88  Pulse: 95 90 94 89  Resp: 16 20 18 18   Temp: 98.7 F (37.1 C) 99.7 F (37.6 C) 99.1 F (37.3 C) 99.3 F (37.4 C)  TempSrc: Oral Oral Oral Oral  SpO2: 100% 100% 100% 100%  Weight:      Height:       Weight change:   Intake/Output Summary (Last 24 hours) at 12/12/2020 1406 Last data filed at 12/11/2020 1813 Gross per 24 hour  Intake 351.54 ml  Output --  Net 351.54 ml    Constitutional: General:   Alert,  Well-developed, well-nourished, pleasant and cooperative in NAD BP (!) 156/88 (BP Location: Right Arm)   Pulse 89   Temp 99.3 F (37.4 C) (Oral)   Resp 18   Ht 5\' 5"  (1.651 m)   Wt 101.2 kg   LMP 03/18/2017 (Exact Date)   SpO2 100%   BMI 37.11 kg/m   Eyes:  Sclera clear, no icterus.   Conjunctiva pink.   Ears:  No scars, lesions or masses, Normal auditory acuity. Nose:  No deformity, discharge, or lesions. Mouth:  No deformity or lesions, oropharynx pink & moist.  Neck:  Supple; no masses, trachea midline  Respiratory: Normal respiratory effort  Gastrointestinal:  Soft, non-tender and non-distended  without masses, hepatosplenomegaly or hernias noted.  No guarding or rebound tenderness.     Cardiac: No clubbing or edema.  No cyanosis. Normal posterior tibial pedal pulses noted.  Lymphatic:  No significant cervical adenopathy.  Psych:  Alert and cooperative. Normal mood and affect.  Musculoskeletal:  Head normocephalic, atraumatic. 5/5 Lower extremity strength bilaterally.  Skin: Warm. Intact without significant lesions or rashes. No jaundice.  Neurologic:  Face symmetrical, tongue midline, Normal sensation to touch  Psych:  Alert and oriented x3, Alert and cooperative. Normal mood and affect.   Lab Results: Lab Results  Component Value Date   WBC 10.7 (H) 12/11/2020   HGB 7.0 (L) 12/12/2020   HCT 20.4 (L) 12/11/2020   MCV 88.7 12/11/2020   PLT 324 12/11/2020   Micro Results: Recent Results (from the past 240 hour(s))  Resp Panel by RT-PCR (Flu A&B, Covid) Nasopharyngeal Swab     Status: None   Collection Time: 12/04/20  9:51 PM   Specimen: Nasopharyngeal Swab; Nasopharyngeal(NP) swabs in vial transport medium  Result Value Ref Range Status   SARS Coronavirus 2 by RT PCR NEGATIVE NEGATIVE Final    Comment: (NOTE) SARS-CoV-2 target nucleic acids are NOT DETECTED.  The SARS-CoV-2 RNA is generally detectable in upper respiratory specimens during the acute phase of infection. The lowest concentration of  SARS-CoV-2 viral copies this assay can detect is 138 copies/mL. A negative result does not preclude SARS-Cov-2 infection and should not be used as the sole basis for treatment or other patient management decisions. A negative result may occur with  improper specimen collection/handling, submission of specimen other than nasopharyngeal swab, presence of viral mutation(s) within the areas targeted by this assay, and inadequate number of viral copies(<138 copies/mL). A negative result must be combined with clinical observations, patient history, and  epidemiological information. The expected result is Negative.  Fact Sheet for Patients:  EntrepreneurPulse.com.au  Fact Sheet for Healthcare Providers:  IncredibleEmployment.be  This test is no t yet approved or cleared by the [place] FDA and  has been authorized for detection and/or diagnosis of SARS-CoV-2 by FDA under an Emergency Use Authorization (EUA). This EUA will remain  in effect (meaning this test can be used) for the duration of the COVID-19 declaration under Section 564(b)(1) of the Act, 21 U.S.C.section 360bbb-3(b)(1), unless the authorization is terminated  or revoked sooner.       Influenza A by PCR NEGATIVE NEGATIVE Final   Influenza B by PCR NEGATIVE NEGATIVE Final    Comment: (NOTE) The Xpert Xpress SARS-CoV-2/FLU/RSV plus assay is intended as an aid in the diagnosis of influenza from Nasopharyngeal swab specimens and should not be used as a sole basis for treatment. Nasal washings and aspirates are unacceptable for Xpert Xpress SARS-CoV-2/FLU/RSV testing.  Fact Sheet for Patients: EntrepreneurPulse.com.au  Fact Sheet for Healthcare Providers: IncredibleEmployment.be  This test is not yet approved or cleared by the [place] FDA and has been authorized for detection and/or diagnosis of SARS-CoV-2 by FDA under an Emergency Use Authorization (EUA). This EUA will remain in effect (meaning this test can be used) for the duration of the COVID-19 declaration under Section 564(b)(1) of the Act, 21 U.S.C. section 360bbb-3(b)(1), unless the authorization is terminated or revoked.  Performed at [facility], [address] [place], [place] [address]   MRSA Next Gen by PCR, Nasal     Status: None   Collection Time: 12/05/20  8:50 PM   Specimen: Nasal Mucosa; Nasal Swab  Result Value Ref Range Status   MRSA by PCR Next Gen NOT DETECTED NOT DETECTED Final    Comment:  (NOTE) The GeneXpert MRSA Assay (FDA approved for NASAL specimens only), is one component of a comprehensive MRSA colonization surveillance program. It is not intended to diagnose MRSA infection nor to guide or monitor treatment for MRSA infections. Test performance is not FDA approved in patients less than 80 years old. Performed at [facility], [address] [place], [place] [address]    Studies/Results: PERIPHERAL VASCULAR CATHETERIZATION  Result Date: 12/11/2020 See surgical note for result.  CT ENTERO ABD/PELVIS W CONTAST  Result Date: 12/11/2020 CLINICAL DATA:  Upper GI bleed, non variceal, negative endoscopy EXAM: CT ABDOMEN AND PELVIS WITH CONTRAST (ENTEROGRAPHY) TECHNIQUE: Multidetector CT of the abdomen and pelvis during bolus administration of intravenous contrast. Negative oral contrast was given. CONTRAST:  171mL OMNIPAQUE IOHEXOL 300 MG/ML SOLN, additional negative oral enteric contrast COMPARISON:  12/06/2020 FINDINGS: Lower chest: No acute abnormality. Hepatobiliary: No focal liver abnormality is seen. Status post cholecystectomy. No biliary dilatation. Pancreas: Unremarkable. No pancreatic ductal dilatation or surrounding inflammatory changes. Spleen: Normal in size without significant abnormality. Adrenals/Urinary Tract: Adrenal glands are unremarkable. Kidneys are normal, without renal calculi, solid lesion, or hydronephrosis. Bladder is unremarkable. Stomach/Bowel: Stomach is within normal limits. Appendix appears normal. No evidence of  bowel wall thickening, distention, or inflammatory changes. Vascular/Lymphatic: Mixed aortic atherosclerosis. No enlarged abdominal or pelvic lymph nodes. Reproductive: Status post hysterectomy. Other: No abdominal wall hernia or abnormality. No abdominopelvic ascites. Musculoskeletal: No acute or significant osseous findings. IMPRESSION: 1. No CT findings of the abdomen or pelvis to explain or localize upper GI bleed. No evidence  of intraluminal contrast extravasation. 2. Status post cholecystectomy and hysterectomy. 3. Aortic atherosclerosis, advanced for patient age and gender. Aortic Atherosclerosis (ICD10-I70.0). Electronically Signed   By: Delanna Ahmadi M.D.   On: 12/11/2020 07:55   Medications:  Scheduled Meds:  sodium chloride   Intravenous Once   feeding supplement  237 mL Oral BID BM   protamine  50 mg Intravenous Once   vitamin B-12  1,000 mcg Oral Daily   Continuous Infusions:  sodium chloride Stopped (12/12/20 0948)   sodium chloride 10 mL/hr at 12/11/20 1813   PRN Meds:.sodium chloride, acetaminophen **OR** acetaminophen, albuterol, butalbital-acetaminophen-caffeine, hydrALAZINE, ondansetron **OR** ondansetron (ZOFRAN) IV, ondansetron (ZOFRAN) IV, simethicone   Assessment: Principal Problem:   Rectal bleeding Active Problems:   Tobacco use   Uncontrolled hypertension   Vitamin B12 deficiency   Acute blood loss anemia   Hypokalemia    Plan: Patient has not had any further rectal bleeding including bright red or dark red blood that she was having previously when she was having active bleeding  Has not had any bowel movement today and she has been hemodynamically stable.  Bowel movement yesterday was solid compared to liquidy bowel movement she was having when she was having active bleeding   Hemoglobin did drop this morning and she is receiving a transfusion.  It only dropped 0.1 g in about 12 hours as it was 7.1 at 1643 yesterday evening and was 7.0 at 5:44 AM this morning.  Given the minor drop in 12 hours and given no further active bleeding, would hold off on any repeat EGD and colonoscopy at this time.  However, if she has active bleeding or hemoglobin continues to drop further, repeat EGD or colonoscopy can be considered.   Continue serial CBCs as hemoglobin was 7.7 earlier in the day yesterday, but since then patient received embolization and since embolization has only had a 0.1 g drop  as noted above  She is having some abdominal discomfort which may be ?Postembolization.  Continue to monitor.  Patient feels that if she has a bowel movement her abdominal pain will get better.  She does have flatus and had a bowel movement yesterday.  Encourage p.o. intake and monitor for bowel movements and continue serial abdominal exams.  Vascular and general surgery following. If abdominal pain acutely worsens, patient may need further imaging, and would also recommend making vascular surgery and general surgery aware if abdominal pain acutely worsens.     LOS: 8 days   [place] Ant[place], MD 12/12/2020, 2:06 PM

## 2020-12-12 NOTE — Progress Notes (Signed)
[facility] Daily Progress Note  12/11/20:             1.  Ultrasound guidance for vascular access right femoral artery             2.  Catheter placement into cecal branch of the superior mesenteric artery from right femoral approach             3.  Aortogram and selective provocative angiogram of the SMA as well as selective imaging of the cecal branch of the superior mesenteric artery             4.  Microbead embolization of 1.5 cc with 500-700  polyvinyl alcohol beads.             5.  StarClose closure device right femoral artery  Subjective: Patient tearful this AM.  States bowel movements are dark in color no bright red.  Denies any discomfort just some "soreness" to the abdomen.  No acute issues overnight.  Objective: Vitals:   12/11/20 2027 12/12/20 0542 12/12/20 0802 12/12/20 1036  BP: 140/83 (!) 155/90 136/77 140/85  Pulse: 83 95 90 94  Resp: 16 16 20 18   Temp: 99 F (37.2 C) 98.7 F (37.1 C) 99.7 F (37.6 C) 99.1 F (37.3 C)  TempSrc: Oral Oral Oral Oral  SpO2: 100% 100% 100% 100%  Weight:      Height:        Intake/Output Summary (Last 24 hours) at 12/12/2020 1116 Last data filed at 12/11/2020 1813 Gross per 24 hour  Intake 351.54 ml  Output --  Net 351.54 ml   Physical Exam: A&Ox3, NAD CV: RRR Pulmonary: CTA Bilaterally Abdomen: Soft, Nontender, Nondistended Right groin:  Access site: Clean dry and intact Vascular: Warm distally to toes   Laboratory: CBC    Component Value Date/Time   WBC 10.7 (H) 12/11/2020 0511   HGB 7.0 (L) 12/12/2020 0544   HGB 12.7 08/25/2012 1651   HCT 20.4 (L) 12/11/2020 0511   HCT 35.9 08/25/2012 1651   PLT 324 12/11/2020 0511   PLT 374 08/25/2012 1651   BMET    Component Value Date/Time   NA 138 12/12/2020 0544   NA 140 11/28/2020 1024   NA 138 08/25/2012 1651   K 3.6 12/12/2020 0544   K 3.2 (L) 08/25/2012 1651   CL 107 12/12/2020 0544   CL 103 08/25/2012 1651   CO2 24 12/12/2020 0544    CO2 30 08/25/2012 1651   GLUCOSE 109 (H) 12/12/2020 0544   GLUCOSE 77 08/25/2012 1651   BUN 7 12/12/2020 0544   BUN 8 11/28/2020 1024   BUN 9 08/25/2012 1651   CREATININE 0.84 12/12/2020 0544   CREATININE 0.88 11/16/2017 0956   CALCIUM 8.3 (L) 12/12/2020 0544   CALCIUM 9.4 08/25/2012 1651   GFRNONAA >60 12/12/2020 0544   GFRNONAA 82 11/16/2017 0956   GFRAA >60 05/05/2019 1851   GFRAA 95 11/16/2017 0956   Assessment/Planning: The patient is a 45 year old female who presented with gastrointestinal bleeding located at the distal ileum and proximal colon status post embolization - POD#1  1) Hemoglobin drifted down to 7.0 this AM. Patient states that she is going to be transfused. 2) Patient notes continued dark stools - denies any bright red blood.  They take 24 to 48 hours for equilibration. Vitals are stable.   3) No further recommendations from vascular surgery at this time. 4) We will continue to follow peripherally while in house  Discussed with Dr. Ellis Parents Euell Schiff PA-C 12/12/2020 11:16 AM

## 2020-12-13 ENCOUNTER — Telehealth: Payer: Self-pay | Admitting: Gastroenterology

## 2020-12-13 LAB — HEMOGLOBIN: Hemoglobin: 8.6 g/dL — ABNORMAL LOW (ref 12.0–15.0)

## 2020-12-13 LAB — TYPE AND SCREEN
ABO/RH(D): A POS
Antibody Screen: NEGATIVE
Unit division: 0

## 2020-12-13 LAB — BPAM RBC
Blood Product Expiration Date: 202211202359
ISSUE DATE / TIME: 202211181031
Unit Type and Rh: 9500

## 2020-12-13 MED ORDER — LACTULOSE 10 GM/15ML PO SOLN
20.0000 g | Freq: Once | ORAL | Status: AC
  Administered 2020-12-13: 09:00:00 20 g via ORAL
  Filled 2020-12-13: qty 30

## 2020-12-13 MED ORDER — CYANOCOBALAMIN 1000 MCG PO TABS: 1000.0000 ug | ORAL_TABLET | Freq: Every day | ORAL | 0 refills | Status: DC

## 2020-12-13 MED ORDER — BUTALBITAL-APAP-CAFFEINE 50-325-40 MG PO TABS: 1.0000 | ORAL_TABLET | Freq: Four times a day (QID) | ORAL | 0 refills | Status: DC | PRN

## 2020-12-13 NOTE — Telephone Encounter (Signed)
Entered in error

## 2020-12-13 NOTE — Progress Notes (Signed)
Patient is being discharged home to self care. IV has been removed. Discharge instructions given and patient educated on instructions.

## 2020-12-13 NOTE — Discharge Summary (Addendum)
Physician Discharge Summary  Patient ID: [place] MRN: [id number] DOB/AGE: February 20, 1975 45 y.o.  Admit date: 12/04/2020 Discharge date: 12/13/2020  Admission Diagnoses:  Discharge Diagnoses:  Principal Problem:   Rectal bleeding Active Problems:   Tobacco use   Uncontrolled hypertension   Vitamin B12 deficiency   Acute blood loss anemia   Hypokalemia   Discharged Condition: good  Hospital Course:  Cass[place] is a 45 y.o. female with medical history significant for hypertension, NSAID use, who presented to the hospital with large-volume rectal bleeding (bright red blood per rectum).  She felt dizzy after she had multiple bloody stools.     She was admitted to the hospital for rectal bleeding complicated by acute blood loss anemia.  She was treated with IV fluids, IV Protonix and was transfused with 3 units of packed red blood cells.  She was treated with IV fluids, IV Protonix.  EGD, colonoscopy, RBC tagged scan and video capsule endoscopy failed to reveal the etiology of rectal bleeding.  Meckel scan performed on 11/16 did not show any Meckel diverticulosis. CT enterogram 11/17 not show any evidence of bleeding.  Patient was seen by vascular surgery, provocative angiogram of SMA was performed, did not show any bleeding.  Microbead embolization of a 1.5 mL was performed using polyvinyl alcohol beads.  Acute blood loss anemia secondary to rectal bleeding. Rectal bleeding. B12 deficiency. Patient is status post arterial embolization in the branches of SMA.  Required 1 unit of PRBC postprocedure with hemoglobin 7.0. For the last 48 hours, patient no longer has any GI bleed.  I was notified by Dr. By Dr. Allen Norris that the patient can be discharged today. Will hold aspirin and NSAIDs. Patient will be also followed by GI, PCP as well as vascular surgery. She is advised to come back to the hospital if her rectal bleeding happened again. I also prescribed vitamin B12.      Essential hypertension Resume home medicines.  Hypokalemia. Repleted.  Consults: GI, general surgery, and vascular surgery  Significant Diagnostic Studies:   Treatments:   Discharge Exam: Blood pressure (!) 143/96, pulse 91, temperature 98.8 F (37.1 C), resp. rate 17, height 5\' 5"  (1.651 m), weight 101.2 kg, last menstrual period 03/18/2017, SpO2 100 %. General appearance: alert and cooperative Resp: clear to auscultation bilaterally Cardio: regular rate and rhythm, S1, S2 normal, no murmur, click, rub or gallop GI: soft, non-tender; bowel sounds normal; no masses,  no organomegaly Extremities: extremities normal, atraumatic, no cyanosis or edema  Disposition: Discharge disposition: 01-Home or Self Care       Discharge Instructions     Diet - low sodium heart healthy   Complete by: As directed    Increase activity slowly   Complete by: As directed       Allergies as of 12/13/2020   No Known Allergies      Medication List     STOP taking these medications    aspirin EC 81 MG tablet   metoprolol succinate 25 MG 24 hr tablet Commonly known as: TOPROL-XL   metoprolol succinate 50 MG 24 hr tablet Commonly known as: TOPROL-XL   naproxen 500 MG tablet Commonly known as: Naprosyn       TAKE these medications    albuterol 108 (90 Base) MCG/ACT inhaler Commonly known as: VENTOLIN HFA Inhale 2 puffs into the lungs every 6 (six) hours as needed for wheezing or shortness of breath.   amLODipine 5 MG tablet Commonly known as: NORVASC Take  1 tablet (5 mg total) by mouth daily.   butalbital-acetaminophen-caffeine 50-325-40 MG tablet Commonly known as: FIORICET Take 1 tablet by mouth every 6 (six) hours as needed for headache.   cyanocobalamin 1000 MCG tablet Take 1 tablet (1,000 mcg total) by mouth daily. Start taking on: December 14, 2020   gabapentin 300 MG capsule Commonly known as: NEURONTIN Take 300 mg by mouth at bedtime.   lisinopril 40 MG  tablet Commonly known as: ZESTRIL Take 1 tablet (40 mg total) by mouth every morning.   metoprolol tartrate 50 MG tablet Commonly known as: Lopressor Take 1 tablet (50 mg total) by mouth once for 1 dose. Take TWO hours prior to CT procedure        [place], [place] Follow up in 1 week(s).   Specialty: General Practice Contact information: [place] [address] [phone]         Lin Landsman, MD Follow up in 1 week(s).   Specialty: Gastroenterology Contact information: [place] [address] [phone]         Algernon Huxley, MD Follow up in 2 week(s).   Specialties: Vascular Surgery, Radiology, Interventional Cardiology Contact information: [place] [address] [phone]                32 minutes Signed: Sharen Hones 12/13/2020, 11:35 AM

## 2020-12-15 ENCOUNTER — Ambulatory Visit: Admission: RE | Admit: 2020-12-15 | Payer: Medicaid Other | Source: Ambulatory Visit

## 2020-12-17 NOTE — Addendum Note (Signed)
Addended by: Ronaldo Miyamoto on: 12/17/2020 12:02 PM   Modules accepted: Orders

## 2020-12-28 ENCOUNTER — Encounter: Payer: Self-pay | Admitting: Family Medicine

## 2020-12-28 NOTE — Progress Notes (Signed)
Patient: Cassidy Mathis  Service Category: E/M  Provider: Gaspar Cola, MD  DOB: 1975/12/29  DOS: 12/29/2020  Referring Provider: Perlie Gold, FNP  MRN: [id number]  Setting: Ambulatory outpatient  PCP: [facility]  Type: New Patient  Specialty: Interventional Pain Management    Location: Office  Delivery: Face-to-face     Primary Reason(s) for Visit: Encounter for initial evaluation of one or more chronic problems (new to examiner) potentially causing chronic pain, and posing a threat to normal musculoskeletal function. (Level of risk: High) CC: Back Pain (L>R)  HPI  [name] is a 45 y.o. year old, female patient, who comes for the first time to our practice referred by Perlie Gold, FNP for our initial evaluation of her chronic pain. 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[place]); Cervical cancer, FIGO stage IB1 ([place]); Ileus ([place]); Tobacco use; Postoperative anemia due to acute blood loss; Right ovarian cyst; Precordial pain; Palpitations; Heart murmur; Uncontrolled hypertension; Rectal bleeding; Vitamin B12 deficiency; Acute blood loss anemia; Hypokalemia; Acute bilateral low back pain with bilateral sciatica; Foraminal stenosis of lumbar region; Malignant neoplasm of overlapping sites of cervix ([place]); Chronic pain syndrome; Pharmacologic therapy; Disorder of skeletal system; Problems influencing health status; History of marijuana use; Abnormal MRI, cervical spine (05/06/2019); Abnormal MRI, thoracic spine (05/06/2019); Abnormal MRI, lumbar spine (05/06/2019); GERD (gastroesophageal reflux disease); NSAID induced gastritis; Chronic low back pain (1ry area of Pain) (Bilateral) (L>R) w/o sciatica; Chronic lower extremity pain (2ry area of Pain) (Bilateral) (L>R); Chronic sacroiliac joint pain (Bilateral); Lumbosacral facet  syndrome (Bilateral); Lumbar facet hypertrophy (L4-5) (Bilateral); Lumbosacral facet arthropathy; and Somatic dysfunction of sacroiliac joints (Bilateral) on their problem list. Today she comes in for evaluation of her Back Pain (L>R)  Pain Assessment: Location: Lower, Left, Right Back Radiating: L>R. Radaites from lower back into back of legs bilateral inot the ankle Onset: More than a month ago Duration: Chronic pain Quality: Burning, Sharp, Shooting, Discomfort, Contraction Severity: 7 /10 (subjective, self-reported pain score)  Effect on ADL: "Pain is worse at night which makes it hard to sleep at night. Timing: Constant Modifying factors: Lying down and resting BP: (!) 153/92  HR: 86  Onset and Duration: Gradual and Date of onset: 2020 Cause of pain: Unknown Severity: No change since onset and NAS-11 now: 7/10 Timing: Not influenced by the time of the day Aggravating Factors: Bending, Climbing, Kneeling, Lifiting, Prolonged standing, Squatting, Stooping , Twisting, Walking uphill, Walking downhill, and Working Alleviating Factors: Lying down and Resting Associated Problems: Fatigue, Inability to control bladder (urine), Inability to control bowel, Nausea, Sweating, Swelling, Pain that wakes patient up, and Pain that does not allow patient to sleep Quality of Pain: Burning, Sharp, Shooting, and Uncomfortable Previous Examinations or Tests: The patient denies previous exams. Previous Treatments: Epidural steroid injections  According to the patient the primary area of pain is that of the lower back (Bilateral) (L>R).  The patient denies any prior surgeries, physical therapy, but she does admit to having had some MRIs in the past.  The patient also indicates having had some injections done at the [facility] in [place].  Review of the "care everywhere" section of the chart indicate that on 06/04/2019 and 08/02/2019 she had procedures done by Dr. Girtha Hake, MD [facility]  PMR)  Procedures done by Dr. Girtha Hake: 1. bilateral L5 TFESI x2 (06/04/2019 & 08/02/2019) (no benefit  and described as painful)   The patient's second area of pain is that of the lower extremities (Bilateral) (L>R).  The patient denies any surgeries, physical therapy, injection therapies, but she thinks that she had some x-rays done.  In the case of the left lower extremity the pain runs through the back of the leg all the way down to the ankle but never into the foot.  The patient did describe some tingling sensation on the bottom of her foot which seems to be intermittent.  In the case of the right lower extremity, the pattern is exactly the same, but just not as intense.  The patient's third area pain is that of the left ankle.  She denies any surgeries, physical therapy, x-rays, or any type of nerve blocks or joint injections.  Physical exam: The patient was able to toe walk and heel walk without any problems.  She also presented with negative bilateral straight leg raise.  Hyperextension and rotation maneuver of the lumbar spine and Kemp maneuver were positive bilaterally for lumbar facet arthralgia.  Patrick maneuver was positive bilaterally for sacroiliac joint arthralgia but no hip joint pathology.  Since the patient denied having had any physical therapy, we will start by referring her for physical therapy of the lumbar spine.  The patient also denied having had an oral steroid trial, but she also pointed out that she was recently released from the hospital secondary to a GI bleed from the use of NSAIDs.  She indicated having an appointment to see a gastroenterologist in approximately 2 weeks, but she was not given any medications for her GERD, according to her.  For this reason, today we will give her a 2-week prescription for Nexium 40 mg p.o. daily.  The patient was reminded that any further prescriptions for that kind of medication will need to come from either her primary care physician  or her gastroenterologist.  In addition to the above, we have offered the patient a diagnostic bilateral lumbar facet block to determine if our suspicions are correct regarding the etiology of her pain.  She indicated previously having had injections done by Dr. Alba Destine which did not work and because of this, she is not crazy about having any interventional procedures.  I have reminded the patient that this is what I can offer her and if she feels that this is not something that she wants to do, she just needs to let us know.  She decided to proceed with it.  Today I took the time to provide the patient with information regarding my pain practice. The patient was informed that my practice is divided into two sections: an interventional pain management section, as well as a completely separate and distinct medication management section. I explained that I have procedure days for my interventional therapies, and evaluation days for follow-ups and medication management. Because of the amount of documentation required during both, they are kept separated. This means that there is the possibility that she may be scheduled for a procedure on one day, and medication management the next. I have also informed her that because of staffing and facility limitations, I no longer take patients for medication management only. To illustrate the reasons for this, I gave the patient the example of surgeons, and how inappropriate it would be to refer a patient to his/her care, just to write for the post-surgical antibiotics on a surgery done by a different surgeon.   Because interventional pain management is my board-certified specialty, the  patient was informed that joining my practice means that they are open to any and all interventional therapies. I made it clear that this does not mean that they will be forced to have any procedures done. What this means is that I believe interventional therapies to be essential part of the  diagnosis and proper management of chronic pain conditions. Therefore, patients not interested in these interventional alternatives will be better served under the care of a different practitioner.  The patient was also made aware of my Comprehensive Pain Management Safety Guidelines where by joining my practice, they limit all of their nerve blocks and joint injections to those done by our practice, for as long as we are retained to manage their care.   Historic Controlled Substance Pharmacotherapy Review  PMP and historical list of controlled substances: Oxycodone/APAP 5/325, 1 tab p.o. 4 times daily; hydrocodone/APAP 5/325 1 tab p.o. every 4 hours; tramadol 50 mg 1 tab p.o. twice daily; alprazolam 1 mg Current opioid analgesics: .   None MME/day: 0 mg/day   Historical Monitoring: The patient  reports current drug use. Frequency: 1.00 time per week. Drug: Marijuana. List of all UDS Test(s): Lab Results  Component Value Date   MDMA NONE DETECTED 01/01/2020   MDMA NONE DETECTED 03/23/2017   MDMA NONE DETECTED 10/21/2015   MDMA NONE DETECTED 04/18/2015   COCAINSCRNUR NONE DETECTED 01/01/2020   COCAINSCRNUR NONE DETECTED 03/23/2017   COCAINSCRNUR NONE DETECTED 10/21/2015   COCAINSCRNUR NONE DETECTED 04/18/2015   PCPSCRNUR NONE DETECTED 01/01/2020   PCPSCRNUR NONE DETECTED 03/23/2017   PCPSCRNUR NONE DETECTED 10/21/2015   PCPSCRNUR NONE DETECTED 04/18/2015   THCU POSITIVE (A) 01/01/2020   THCU POSITIVE (A) 03/23/2017   THCU POSITIVE (A) 10/21/2015   THCU POSITIVE (A) 04/18/2015   List of other Serum/Urine Drug Screening Test(s):  Lab Results  Component Value Date   COCAINSCRNUR NONE DETECTED 01/01/2020   COCAINSCRNUR NONE DETECTED 03/23/2017   COCAINSCRNUR NONE DETECTED 10/21/2015   COCAINSCRNUR NONE DETECTED 04/18/2015   THCU POSITIVE (A) 01/01/2020   THCU POSITIVE (A) 03/23/2017   THCU POSITIVE (A) 10/21/2015   THCU POSITIVE (A) 04/18/2015   Historical Background  Evaluation: [place] PMP: PDMP reviewed during this encounter. Online review of the past 27-month period conducted.             PMP NARX Score Report:  Narcotic: 120 Sedative: 060 Stimulant: 000 [place] Department of public safety, offender search: Editor, commissioning Information) Non-contributory Risk Assessment Profile: Aberrant behavior: None observed or detected today Risk factors for fatal opioid overdose: None identified today PMP NARX Overdose Risk Score: 340 Fatal overdose hazard ratio (HR): Calculation deferred Non-fatal overdose hazard ratio (HR): Calculation deferred Risk of opioid abuse or dependence: 0.7-3.0% with doses ? 36 MME/day and 6.1-26% with doses ? 120 MME/day. Substance use disorder (SUD) risk level: See below Personal History of Substance Abuse (SUD-Substance use disorder):  Alcohol: Negative  Illegal Drugs: Negative  Rx Drugs: Negative  ORT Risk Level calculation: Low Risk  Opioid Risk Tool - 12/29/20 1238       Family History of Substance Abuse   Alcohol Negative    Illegal Drugs Negative    Rx Drugs Negative      Personal History of Substance Abuse   Alcohol Negative    Illegal Drugs Negative    Rx Drugs Negative      Age   Age between 77-45 years  Yes      History of Preadolescent Sexual Abuse   History  of Preadolescent Sexual Abuse Negative or Female      Psychological Disease   Psychological Disease Negative    ADD --    OCD --    Bipolar --   has stopped taking medications for biploar 5 yeas ago   Schizophrenia --    Depression Positive      Total Score   Opioid Risk Tool Scoring 2    Opioid Risk Interpretation Low Risk            ORT Scoring interpretation table:  Score <3 = Low Risk for SUD  Score between 4-7 = Moderate Risk for SUD  Score >8 = High Risk for Opioid Abuse   PHQ-2 Depression Scale:  Total score: 0  PHQ-2 Scoring interpretation table: (Score and probability of major depressive disorder)  Score 0 = No depression  Score 1 = 15.4%  Probability  Score 2 = 21.1% Probability  Score 3 = 38.4% Probability  Score 4 = 45.5% Probability  Score 5 = 56.4% Probability  Score 6 = 78.6% Probability   PHQ-9 Depression Scale:  Total score: 0  PHQ-9 Scoring interpretation table:  Score 0-4 = No depression  Score 5-9 = Mild depression  Score 10-14 = Moderate depression  Score 15-19 = Moderately severe depression  Score 20-27 = Severe depression (2.4 times higher risk of SUD and 2.89 times higher risk of overuse)   Pharmacologic Plan: As per protocol, I have not taken over any controlled substance management, pending the results of ordered tests and/or consults.            Initial impression: Pending review of available data and ordered tests.  Meds   Current Outpatient Medications:    albuterol (PROVENTIL HFA;VENTOLIN HFA) 108 (90 Base) MCG/ACT inhaler, Inhale 2 puffs into the lungs every 6 (six) hours as needed for wheezing or shortness of breath., Disp: 1 Inhaler, Rfl: 2   amLODipine (NORVASC) 5 MG tablet, Take 1 tablet (5 mg total) by mouth daily., Disp: 30 tablet, Rfl: 1   butalbital-acetaminophen-caffeine (FIORICET) 50-325-40 MG tablet, Take 1 tablet by mouth every 6 (six) hours as needed for headache., Disp: 14 tablet, Rfl: 0   esomeprazole (NEXIUM) 40 MG capsule, Take 1 capsule (40 mg total) by mouth daily for 14 days., Disp: 14 capsule, Rfl: 0   gabapentin (NEURONTIN) 300 MG capsule, Take 300 mg by mouth at bedtime., Disp: , Rfl:    lisinopril (ZESTRIL) 40 MG tablet, Take 1 tablet (40 mg total) by mouth every morning., Disp: 30 tablet, Rfl: 1   vitamin B-12 1000 MCG tablet, Take 1 tablet (1,000 mcg total) by mouth daily., Disp: 30 tablet, Rfl: 0  Imaging Review  Cervical Imaging: Cervical MR wo contrast: Results for orders placed during the hospital encounter of 05/05/19 MR Cervical Spine Wo Contrast  Narrative CLINICAL DATA:  Initial evaluation for acute right upper extremity numbness, paresthesias.  EXAM: MRI  CERVICAL, THORACIC AND LUMBAR SPINE WITHOUT CONTRAST  TECHNIQUE: Multiplanar and multiecho pulse sequences of the cervical spine, to include the craniocervical junction and cervicothoracic junction, and thoracic and lumbar spine, were obtained without intravenous contrast.  COMPARISON:  None.  FINDINGS: MRI CERVICAL SPINE FINDINGS  Alignment: Reversal of the normal cervical lordosis without listhesis.  Vertebrae: Vertebral body height maintained without evidence for acute or chronic fracture. Bone marrow signal intensity diffusely decreased on T1 weighted imaging, nonspecific, but most commonly related to anemia, smoking, or obesity. No discrete or worrisome osseous lesions. No abnormal marrow edema.  Cord: Signal intensity within the cervical spinal cord is normal.  Posterior Fossa, vertebral arteries, paraspinal tissues: Visualized brain and posterior fossa within normal limits. Craniocervical junction normal. Paraspinous and prevertebral soft tissues within normal limits. Normal intravascular flow voids seen within the vertebral arteries bilaterally.  Disc levels:  C2-C3: Unremarkable.  C3-C4: Mild disc bulge. Superimposed tiny central disc protrusion minimally indents the ventral thecal sac (series 4, image 9). No significant spinal stenosis or cord deformity. Foramina remain patent.  C4-C5: Mild diffuse disc bulge. Mild flattening of the ventral thecal sac without significant spinal stenosis or cord deformity. Foramina remain patent.  C5-C6: Mild diffuse disc bulge. Associated small central annular fissure. Flattening of the ventral thecal sac without significant spinal stenosis or cord deformity. Foramina remain patent.  C6-C7: Mild diffuse disc bulge with bilateral uncovertebral hypertrophy. Disc bulge slightly asymmetric to the right with flattening of the right ventral thecal sac and minimal flattening of the right hemi cord. Ventral right C7 nerve root  could be affected. No significant spinal stenosis. Foramina remain patent.  C7-T1:  Unremarkable.  MRI THORACIC SPINE FINDINGS  Alignment: Physiologic with preservation of the normal thoracic kyphosis. No listhesis.  Vertebrae: Vertebral body height maintained without evidence for acute or chronic fracture. Bone marrow signal intensity diffusely decreased on T1 weighted imaging, nonspecific, but most commonly related to anemia, smoking, or obesity. Few scattered subcentimeter benign hemangiomata noted. No worrisome osseous lesions. No abnormal marrow edema.  Cord: Signal intensity within the thoracic spinal cord is normal. Normal cord caliber morphology.  Paraspinal and other soft tissues: Unremarkable.  Disc levels:  No significant disc pathology seen within the thoracic spine. No focal disc herniation or significant disc bulge. No canal or neural foraminal stenosis. No impingement.  MRI LUMBAR SPINE FINDINGS  Segmentation: Standard. Lowest well-formed disc space labeled the L5-S1 level.  Alignment: Physiologic with preservation of the normal lumbar lordosis. No listhesis.  Vertebrae: Vertebral body height maintained without evidence for acute or chronic fracture. Bone marrow signal intensity diffusely decreased on T1 weighted imaging, nonspecific, but most commonly related to anemia, smoking or obesity. Subcentimeter benign hemangioma noted within the T12 vertebral body. No other discrete or worrisome osseous lesions. No abnormal marrow edema.  Conus medullaris and cauda equina: Conus extends to the L2 level. Conus and cauda equina appear normal.  Paraspinal and other soft tissues: Paraspinous soft tissues within normal limits. Subcentimeter simple cyst noted within the interpolar right kidney. Visualized visceral structures otherwise unremarkable.  Disc levels:  L1-2:  Unremarkable.  L2-3:  Unremarkable.  L3-4: Mild diffuse disc bulge with disc desiccation.  No significant spinal stenosis. Foramina remain patent.  L4-5: Mild diffuse disc bulge with disc desiccation. Mild bilateral facet hypertrophy. No canal or lateral recess stenosis. Mild left L4 foraminal narrowing. No significant right foraminal stenosis.  L5-S1: Mild disc bulge with disc desiccation. No significant canal or lateral recess stenosis. Foramina remain patent.  IMPRESSION: MRI CERVICAL SPINE IMPRESSION:  1. No acute abnormality within the cervical spine. Normal MRI appearance of the cervical spinal cord. 2. Right eccentric disc bulge at C6-7 with secondary mild flattening of the right hemi cord. The ventral right C7 nerve root could be affected. 3. Additional mild noncompressive disc bulging at C3-4 through C5-6 without stenosis or impingement.  MRI THORACIC SPINE IMPRESSION:  Normal MRI of the thoracic spine and spinal cord. No significant disc pathology, stenosis, or evidence for neural impingement.  MRI LUMBAR SPINE IMPRESSION:  1. No acute abnormality within the lumbar  spine. 2. Mild disc bulging at L4-5 with resultant mild left L4 foraminal stenosis. 3. Additional mild noncompressive disc bulging at L3-4 and L5-S1 without stenosis or impingement. 4. Diffusely decreased T1 weighted signal intensity throughout the visualized bone marrow, nonspecific, but most commonly related to anemia, smoking, or obesity. Correlation with history and laboratory values recommended.   Electronically Signed By: Jeannine Boga M.D. On: 05/06/2019 01:19  Cervical DG complete: Results for orders placed in visit on 03/09/98 DG Cervical Spine Complete  Narrative FINDINGS IMPRESSION NORMAL CERVICAL SPINE.  Thoracic Imaging: Thoracic MR wo contrast: Results for orders placed during the hospital encounter of 05/05/19 MR THORACIC SPINE WO CONTRAST  Narrative CLINICAL DATA:  Initial evaluation for acute right upper extremity numbness, paresthesias.  EXAM: MRI  CERVICAL, THORACIC AND LUMBAR SPINE WITHOUT CONTRAST  TECHNIQUE: Multiplanar and multiecho pulse sequences of the cervical spine, to include the craniocervical junction and cervicothoracic junction, and thoracic and lumbar spine, were obtained without intravenous contrast.  COMPARISON:  None.  FINDINGS: MRI CERVICAL SPINE FINDINGS  Alignment: Reversal of the normal cervical lordosis without listhesis.  Vertebrae: Vertebral body height maintained without evidence for acute or chronic fracture. Bone marrow signal intensity diffusely decreased on T1 weighted imaging, nonspecific, but most commonly related to anemia, smoking, or obesity. No discrete or worrisome osseous lesions. No abnormal marrow edema.  Cord: Signal intensity within the cervical spinal cord is normal.  Posterior Fossa, vertebral arteries, paraspinal tissues: Visualized brain and posterior fossa within normal limits. Craniocervical junction normal. Paraspinous and prevertebral soft tissues within normal limits. Normal intravascular flow voids seen within the vertebral arteries bilaterally.  Disc levels:  C2-C3: Unremarkable.  C3-C4: Mild disc bulge. Superimposed tiny central disc protrusion minimally indents the ventral thecal sac (series 4, image 9). No significant spinal stenosis or cord deformity. Foramina remain patent.  C4-C5: Mild diffuse disc bulge. Mild flattening of the ventral thecal sac without significant spinal stenosis or cord deformity. Foramina remain patent.  C5-C6: Mild diffuse disc bulge. Associated small central annular fissure. Flattening of the ventral thecal sac without significant spinal stenosis or cord deformity. Foramina remain patent.  C6-C7: Mild diffuse disc bulge with bilateral uncovertebral hypertrophy. Disc bulge slightly asymmetric to the right with flattening of the right ventral thecal sac and minimal flattening of the right hemi cord. Ventral right C7 nerve root  could be affected. No significant spinal stenosis. Foramina remain patent.  C7-T1:  Unremarkable.  MRI THORACIC SPINE FINDINGS  Alignment: Physiologic with preservation of the normal thoracic kyphosis. No listhesis.  Vertebrae: Vertebral body height maintained without evidence for acute or chronic fracture. Bone marrow signal intensity diffusely decreased on T1 weighted imaging, nonspecific, but most commonly related to anemia, smoking, or obesity. Few scattered subcentimeter benign hemangiomata noted. No worrisome osseous lesions. No abnormal marrow edema.  Cord: Signal intensity within the thoracic spinal cord is normal. Normal cord caliber morphology.  Paraspinal and other soft tissues: Unremarkable.  Disc levels:  No significant disc pathology seen within the thoracic spine. No focal disc herniation or significant disc bulge. No canal or neural foraminal stenosis. No impingement.  MRI LUMBAR SPINE FINDINGS  Segmentation: Standard. Lowest well-formed disc space labeled the L5-S1 level.  Alignment: Physiologic with preservation of the normal lumbar lordosis. No listhesis.  Vertebrae: Vertebral body height maintained without evidence for acute or chronic fracture. Bone marrow signal intensity diffusely decreased on T1 weighted imaging, nonspecific, but most commonly related to anemia, smoking or obesity. Subcentimeter benign hemangioma noted within the T12  vertebral body. No other discrete or worrisome osseous lesions. No abnormal marrow edema.  Conus medullaris and cauda equina: Conus extends to the L2 level. Conus and cauda equina appear normal.  Paraspinal and other soft tissues: Paraspinous soft tissues within normal limits. Subcentimeter simple cyst noted within the interpolar right kidney. Visualized visceral structures otherwise unremarkable.  Disc levels:  L1-2:  Unremarkable.  L2-3:  Unremarkable.  L3-4: Mild diffuse disc bulge with disc desiccation.  No significant spinal stenosis. Foramina remain patent.  L4-5: Mild diffuse disc bulge with disc desiccation. Mild bilateral facet hypertrophy. No canal or lateral recess stenosis. Mild left L4 foraminal narrowing. No significant right foraminal stenosis.  L5-S1: Mild disc bulge with disc desiccation. No significant canal or lateral recess stenosis. Foramina remain patent.  IMPRESSION: MRI CERVICAL SPINE IMPRESSION:  1. No acute abnormality within the cervical spine. Normal MRI appearance of the cervical spinal cord. 2. Right eccentric disc bulge at C6-7 with secondary mild flattening of the right hemi cord. The ventral right C7 nerve root could be affected. 3. Additional mild noncompressive disc bulging at C3-4 through C5-6 without stenosis or impingement.  MRI THORACIC SPINE IMPRESSION:  Normal MRI of the thoracic spine and spinal cord. No significant disc pathology, stenosis, or evidence for neural impingement.  MRI LUMBAR SPINE IMPRESSION:  1. No acute abnormality within the lumbar spine. 2. Mild disc bulging at L4-5 with resultant mild left L4 foraminal stenosis. 3. Additional mild noncompressive disc bulging at L3-4 and L5-S1 without stenosis or impingement. 4. Diffusely decreased T1 weighted signal intensity throughout the visualized bone marrow, nonspecific, but most commonly related to anemia, smoking, or obesity. Correlation with history and laboratory values recommended.   Electronically Signed By: Jeannine Boga M.D. On: 05/06/2019 01:19  Lumbosacral Imaging: Lumbar MR wo contrast: Results for orders placed during the hospital encounter of 05/05/19 MR LUMBAR SPINE WO CONTRAST  Narrative CLINICAL DATA:  Initial evaluation for acute right upper extremity numbness, paresthesias.  EXAM: MRI CERVICAL, THORACIC AND LUMBAR SPINE WITHOUT CONTRAST  TECHNIQUE: Multiplanar and multiecho pulse sequences of the cervical spine, to include the craniocervical  junction and cervicothoracic junction, and thoracic and lumbar spine, were obtained without intravenous contrast.  COMPARISON:  None.  FINDINGS: MRI CERVICAL SPINE FINDINGS  Alignment: Reversal of the normal cervical lordosis without listhesis.  Vertebrae: Vertebral body height maintained without evidence for acute or chronic fracture. Bone marrow signal intensity diffusely decreased on T1 weighted imaging, nonspecific, but most commonly related to anemia, smoking, or obesity. No discrete or worrisome osseous lesions. No abnormal marrow edema.  Cord: Signal intensity within the cervical spinal cord is normal.  Posterior Fossa, vertebral arteries, paraspinal tissues: Visualized brain and posterior fossa within normal limits. Craniocervical junction normal. Paraspinous and prevertebral soft tissues within normal limits. Normal intravascular flow voids seen within the vertebral arteries bilaterally.  Disc levels:  C2-C3: Unremarkable.  C3-C4: Mild disc bulge. Superimposed tiny central disc protrusion minimally indents the ventral thecal sac (series 4, image 9). No significant spinal stenosis or cord deformity. Foramina remain patent.  C4-C5: Mild diffuse disc bulge. Mild flattening of the ventral thecal sac without significant spinal stenosis or cord deformity. Foramina remain patent.  C5-C6: Mild diffuse disc bulge. Associated small central annular fissure. Flattening of the ventral thecal sac without significant spinal stenosis or cord deformity. Foramina remain patent.  C6-C7: Mild diffuse disc bulge with bilateral uncovertebral hypertrophy. Disc bulge slightly asymmetric to the right with flattening of the right ventral thecal sac and minimal  flattening of the right hemi cord. Ventral right C7 nerve root could be affected. No significant spinal stenosis. Foramina remain patent.  C7-T1:  Unremarkable.  MRI THORACIC SPINE FINDINGS  Alignment: Physiologic with  preservation of the normal thoracic kyphosis. No listhesis.  Vertebrae: Vertebral body height maintained without evidence for acute or chronic fracture. Bone marrow signal intensity diffusely decreased on T1 weighted imaging, nonspecific, but most commonly related to anemia, smoking, or obesity. Few scattered subcentimeter benign hemangiomata noted. No worrisome osseous lesions. No abnormal marrow edema.  Cord: Signal intensity within the thoracic spinal cord is normal. Normal cord caliber morphology.  Paraspinal and other soft tissues: Unremarkable.  Disc levels:  No significant disc pathology seen within the thoracic spine. No focal disc herniation or significant disc bulge. No canal or neural foraminal stenosis. No impingement.  MRI LUMBAR SPINE FINDINGS  Segmentation: Standard. Lowest well-formed disc space labeled the L5-S1 level.  Alignment: Physiologic with preservation of the normal lumbar lordosis. No listhesis.  Vertebrae: Vertebral body height maintained without evidence for acute or chronic fracture. Bone marrow signal intensity diffusely decreased on T1 weighted imaging, nonspecific, but most commonly related to anemia, smoking or obesity. Subcentimeter benign hemangioma noted within the T12 vertebral body. No other discrete or worrisome osseous lesions. No abnormal marrow edema.  Conus medullaris and cauda equina: Conus extends to the L2 level. Conus and cauda equina appear normal.  Paraspinal and other soft tissues: Paraspinous soft tissues within normal limits. Subcentimeter simple cyst noted within the interpolar right kidney. Visualized visceral structures otherwise unremarkable.  Disc levels:  L1-2:  Unremarkable.  L2-3:  Unremarkable.  L3-4: Mild diffuse disc bulge with disc desiccation. No significant spinal stenosis. Foramina remain patent.  L4-5: Mild diffuse disc bulge with disc desiccation. Mild bilateral facet hypertrophy. No canal or  lateral recess stenosis. Mild left L4 foraminal narrowing. No significant right foraminal stenosis.  L5-S1: Mild disc bulge with disc desiccation. No significant canal or lateral recess stenosis. Foramina remain patent.  IMPRESSION: MRI CERVICAL SPINE IMPRESSION:  1. No acute abnormality within the cervical spine. Normal MRI appearance of the cervical spinal cord. 2. Right eccentric disc bulge at C6-7 with secondary mild flattening of the right hemi cord. The ventral right C7 nerve root could be affected. 3. Additional mild noncompressive disc bulging at C3-4 through C5-6 without stenosis or impingement.  MRI THORACIC SPINE IMPRESSION:  Normal MRI of the thoracic spine and spinal cord. No significant disc pathology, stenosis, or evidence for neural impingement.  MRI LUMBAR SPINE IMPRESSION:  1. No acute abnormality within the lumbar spine. 2. Mild disc bulging at L4-5 with resultant mild left L4 foraminal stenosis. 3. Additional mild noncompressive disc bulging at L3-4 and L5-S1 without stenosis or impingement. 4. Diffusely decreased T1 weighted signal intensity throughout the visualized bone marrow, nonspecific, but most commonly related to anemia, smoking, or obesity. Correlation with history and laboratory values recommended.   Electronically Signed By: Jeannine Boga M.D. On: 05/06/2019 01:19  Knee Imaging: Knee-L DG 4 views: Results for orders placed during the hospital encounter of 03/24/20 DG Knee Complete 4 Views Left  Narrative CLINICAL DATA:  Knee pain for 2 weeks, no known injury, initial encounter  EXAM: LEFT KNEE - COMPLETE 4+ VIEW  COMPARISON:  None.  FINDINGS: No acute fracture or dislocation is noted. Mild medial joint space narrowing is noted. No joint effusion is seen. No soft tissue abnormality is noted.  IMPRESSION: Mild degenerative change without acute abnormality.   Electronically Signed By:  Inez Catalina M.D. On: 03/24/2020  13:31  Ankle Imaging: Ankle-R DG Complete: Results for orders placed during the hospital encounter of 03/10/16 DG Ankle Complete Right  Narrative CLINICAL DATA:  Ankle pain and swelling  EXAM: RIGHT ANKLE - COMPLETE 3+ VIEW  COMPARISON:  06/02/2016  FINDINGS: There is no evidence of fracture, dislocation, or joint effusion. There is no evidence of arthropathy or other focal bone abnormality. Soft tissues are unremarkable.  IMPRESSION: Negative.   Electronically Signed By: Kerby Moors M.D. On: 03/10/2016 13:28  Ankle-L DG Complete: Results for orders placed during the hospital encounter of 06/03/14 DG Ankle Complete Left  Narrative CLINICAL DATA:  Fall.  Left ankle pain.  EXAM: LEFT ANKLE COMPLETE - 3+ VIEW  COMPARISON:  01/30/2014  FINDINGS: There is marked lateral soft tissue swelling. There is an spiral fracture involving the distal fibula. The fracture fragments are in near anatomic alignment. No additional fractures or subluxations identified.  IMPRESSION: 1. Spiral fracture involves the distal fibula. 2. Soft tissue swelling.   Electronically Signed By: Kerby Moors M.D. On: 06/03/2014 13:47  Complexity Note: Imaging results reviewed. Results shared with Ms. Sthilaire, using Layman's terms.                        ROS  Cardiovascular: High blood pressure, Chest pain, and Heart murmur Pulmonary or Respiratory: Wheezing and difficulty taking a deep full breath (Asthma) and Snoring  Neurological: No reported neurological signs or symptoms such as seizures, abnormal skin sensations, urinary and/or fecal incontinence, being born with an abnormal open spine and/or a tethered spinal cord Psychological-Psychiatric: Difficulty sleeping and or falling asleep Gastrointestinal: Reflux or heatburn Genitourinary: No reported renal or genitourinary signs or symptoms such as difficulty voiding or producing urine, peeing blood, non-functioning kidney, kidney stones,  difficulty emptying the bladder, difficulty controlling the flow of urine, or chronic kidney disease Hematological: Weakness due to low blood hemoglobin or red blood cell count (Anemia) Endocrine: No reported endocrine signs or symptoms such as high or low blood sugar, rapid heart rate due to high thyroid levels, obesity or weight gain due to slow thyroid or thyroid disease Rheumatologic: No reported rheumatological signs and symptoms such as fatigue, joint pain, tenderness, swelling, redness, heat, stiffness, decreased range of motion, with or without associated rash Musculoskeletal: Negative for myasthenia gravis, muscular dystrophy, multiple sclerosis or malignant hyperthermia Work History: Working full time  Allergies  Ms. Suthers has No Known Allergies.  Laboratory Chemistry Profile   Renal Lab Results  Component Value Date   BUN 7 12/12/2020   CREATININE 0.84 12/12/2020   BCR 8 (L) 11/28/2020   GFRAA >60 05/05/2019   GFRNONAA >60 12/12/2020   PROTEINUR NEGATIVE 12/02/2019     Electrolytes Lab Results  Component Value Date   NA 138 12/12/2020   K 3.6 12/12/2020   CL 107 12/12/2020   CALCIUM 8.3 (L) 12/12/2020   MG 1.9 12/12/2020   PHOS 3.4 12/08/2020     Hepatic Lab Results  Component Value Date   AST 18 12/04/2020   ALT 19 12/04/2020   ALBUMIN 3.6 12/04/2020   ALKPHOS 59 12/04/2020   LIPASE 87 (H) 12/04/2020     ID Lab Results  Component Value Date   HIV Non Reactive 12/04/2020   SARSCOV2NAA NEGATIVE 12/04/2020   HCVAB 0.1 12/16/2016   PREGTESTUR NEGATIVE 03/23/2017     Bone No results found for: [place], [place], [id number], [id number], [place], 25OHVITD2, 25OHVITD3, TESTOFREE, TESTOSTERONE  Endocrine Lab Results  Component Value Date   GLUCOSE 109 (H) 12/12/2020   GLUCOSEU NEGATIVE 12/02/2019   HGBA1C 5.5 12/15/2016   TSH 0.683 12/15/2016     Neuropathy Lab Results  Component Value Date   VITAMINB12 1,812 (H) 12/10/2020   FOLATE >24.8  12/05/2020   HGBA1C 5.5 12/15/2016   HIV Non Reactive 12/04/2020     CNS No results found for: COLORCSF, APPEARCSF, RBCCOUNTCSF, WBCCSF, POLYSCSF, LYMPHSCSF, EOSCSF, PROTEINCSF, GLUCCSF, JCVIRUS, CSFOLI, IGGCSF, LABACHR, ACETBL, LABACHR, ACETBL   Inflammation (CRP: Acute  ESR: Chronic) Lab Results  Component Value Date   CRP 0.6 05/05/2019   ESRSEDRATE 19 05/05/2019     Rheumatology Lab Results  Component Value Date   LABURIC 5.4 03/24/2020     Coagulation Lab Results  Component Value Date   INR 1.0 12/04/2020   LABPROT 13.5 12/04/2020   APTT 28 03/22/2017   PLT 324 12/11/2020     Cardiovascular Lab Results  Component Value Date   CKTOTAL 191 (H) 01/04/2018   CKMB < 0.5 (L) 02/03/2012   TROPONINI <0.03 10/04/2015   HGB 8.6 (L) 12/13/2020   HCT 20.4 (L) 12/11/2020     Screening Lab Results  Component Value Date   SARSCOV2NAA NEGATIVE 12/04/2020   HCVAB 0.1 12/16/2016   HIV Non Reactive 12/04/2020   PREGTESTUR NEGATIVE 03/23/2017     Cancer No results found for: CEA, CA125, LABCA2   Allergens No results found for: ALMOND, APPLE, ASPARAGUS, AVOCADO, BANANA, BARLEY, BASIL, BAYLEAF, GREENBEAN, LIMABEAN, WHITEBEAN, BEEFIGE, REDBEET, BLUEBERRY, BROCCOLI, CABBAGE, MELON, CARROT, CASEIN, CASHEWNUT, CAULIFLOWER, CELERY     Note: Lab results reviewed.  PFSH  Drug: Ms. Rossini  reports current drug use. Frequency: 1.00 time per week. Drug: Marijuana. Alcohol:  reports current alcohol use. Tobacco:  reports that she has been smoking cigarettes. She has a 6.00 pack-year smoking history. She has never used smokeless tobacco. Medical:  has a past medical history of Allergy, Anemia, Asthma, Cancer ([place]), Depression, GERD (gastroesophageal reflux disease), Headache, Hypertension, Pancreatitis, Pilonidal cyst, and Pneumonia (2018). Family: family history includes Cancer in her maternal grandfather, maternal grandmother, paternal grandfather, paternal grandmother, and paternal  uncle; Gallstones in her mother; Gout in her father; Hypertension in her father and mother.  Past Surgical History:  Procedure Laterality Date   ABDOMINAL HYSTERECTOMY     partial   CERVICAL CONIZATION W/BX N/A 03/23/2017   Procedure: CONIZATION CERVIX WITH BIOPSY;  Surgeon: Gillis Ends, MD;  Location: [facility] ORS;  Service: Gynecology;  Laterality: N/A;   CHOLECYSTECTOMY  10/21/2015   Procedure: LAPAROSCOPIC CHOLECYSTECTOMY WITH INTRAOPERATIVE CHOLANGIOGRAM;  Surgeon: Jules Husbands, MD;  Location: [facility] ORS;  Service: General;;   CHOLECYSTECTOMY Bilateral    COLONOSCOPY WITH PROPOFOL N/A 12/06/2020   Procedure: COLONOSCOPY WITH PROPOFOL;  Surgeon: Lesly Rubenstein, MD;  Location: [facility] ENDOSCOPY;  Service: Endoscopy;  Laterality: N/A;   CYST EXCISION     tongue AND WRIST   EPIGASTRIC HERNIA REPAIR N/A 04/18/2015   Procedure: HERNIA REPAIR EPIGASTRIC ADULT;  Surgeon: Jules Husbands, MD;  Location: [facility] ORS;  Service: General;  Laterality: N/A;   ERCP N/A 12/21/2016   Procedure: ENDOSCOPIC RETROGRADE CHOLANGIOPANCREATOGRAPHY (ERCP);  Surgeon: [name], MD;  Location: [facility] ENDOSCOPY;  Service: Endoscopy;  Laterality: N/A;   ESOPHAGOGASTRODUODENOSCOPY  12/06/2020   Procedure: ESOPHAGOGASTRODUODENOSCOPY (EGD);  Surgeon: Lesly Rubenstein, MD;  Location: [facility];  Service: Endoscopy;;   GIVENS CAPSULE STUDY N/A 12/08/2020   Procedure: GIVENS CAPSULE STUDY;  Surgeon: Sherri Sear  Betti Cruz, MD;  Location: [facility] ENDOSCOPY;  Service: Gastroenterology;  Laterality: N/A;   HERNIA REPAIR     LAPAROSCOPIC SALPINGO OOPHERECTOMY Right 01/01/2020   Procedure: LAPAROSCOPIC SALPINGO OOPHORECTOMY;  Surgeon: Schermerhorn, Ihor Austin, MD;  Location: [facility] ORS;  Service: Gynecology;  Laterality: Right;   LYSIS OF ADHESION N/A 01/01/2020   Procedure: LYSIS OF ADHESION;  Surgeon: Schermerhorn, Ihor Austin, MD;  Location: [facility] ORS;  Service: Gynecology;  Laterality: N/A;   RADICAL HYSTERECTOMY  04/14/2017    SALPINGECTOMY Bilateral 04/14/2017   SENTINEL LYMPH NODE BIOPSY     TUBAL LIGATION     VISCERAL ANGIOGRAPHY N/A 12/11/2020   Procedure: VISCERAL ANGIOGRAPHY;  Surgeon: Annice Needy, MD;  Location: [facility] INVASIVE CV LAB;  Service: Cardiovascular;  Laterality: N/A;   Active Ambulatory Problems    Diagnosis Date Noted   Incarcerated epigastric hernia    Calculus of gallbladder with chronic cholecystitis without obstruction    Pancreatitis 12/16/2016   Common bile duct stone    Malignant neoplasm of exocervix (HCC)    Cervical cancer, FIGO stage IB1 (HCC) 05/17/2017   Ileus (HCC) 04/18/2017   Tobacco use 04/06/2017   Postoperative anemia due to acute blood loss 04/18/2017   Right ovarian cyst    Precordial pain 11/28/2020   Palpitations 11/28/2020   Heart murmur 11/28/2020   Uncontrolled hypertension 11/28/2020   Rectal bleeding 12/04/2020   Vitamin B12 deficiency 12/06/2020   Acute blood loss anemia 12/07/2020   Hypokalemia 12/11/2020   Acute bilateral low back pain with bilateral sciatica 06/01/2019   Foraminal stenosis of lumbar region 06/01/2019   Malignant neoplasm of overlapping sites of cervix (HCC) 04/06/2017   Chronic pain syndrome 12/29/2020   Pharmacologic therapy 12/29/2020   Disorder of skeletal system 12/29/2020   Problems influencing health status 12/29/2020   History of marijuana use 12/29/2020   Abnormal MRI, cervical spine (05/06/2019) 12/29/2020   Abnormal MRI, thoracic spine (05/06/2019) 12/29/2020   Abnormal MRI, lumbar spine (05/06/2019) 12/29/2020   GERD (gastroesophageal reflux disease) 12/29/2020   NSAID induced gastritis 12/29/2020   Chronic low back pain (1ry area of Pain) (Bilateral) (L>R) w/o sciatica 12/29/2020   Chronic lower extremity pain (2ry area of Pain) (Bilateral) (L>R) 12/29/2020   Chronic sacroiliac joint pain (Bilateral) 12/29/2020   Lumbosacral facet syndrome (Bilateral) 12/29/2020   Lumbar facet hypertrophy (L4-5) (Bilateral) 12/29/2020    Lumbosacral facet arthropathy 12/29/2020   Somatic dysfunction of sacroiliac joints (Bilateral) 12/29/2020   Resolved Ambulatory Problems    Diagnosis Date Noted   Acute pancreatitis 12/22/2016   Past Medical History:  Diagnosis Date   Allergy    Anemia    Asthma    Cancer (HCC)    Depression    Headache    Hypertension    Pilonidal cyst    Pneumonia 2018   Constitutional Exam  General appearance: Well nourished, well developed, and well hydrated. In no apparent acute distress Vitals:   12/29/20 1227  BP: (!) 153/92  Pulse: 86  Resp: 16  Temp: (!) 97.2 F (36.2 C)  TempSrc: Temporal  SpO2: 100%  Weight: 220 lb (99.8 kg)  Height: 5\' 5"  (1.651 m)   BMI Assessment: Estimated body mass index is 36.61 kg/m as calculated from the following:   Height as of this encounter: 5\' 5"  (1.651 m).   Weight as of this encounter: 220 lb (99.8 kg).  BMI interpretation table: BMI level Category Range association with higher incidence of chronic pain  <18 kg/m2 Underweight  18.5-24.9 kg/m2 Ideal body weight   25-29.9 kg/m2 Overweight Increased incidence by 20%  30-34.9 kg/m2 Obese (Class I) Increased incidence by 68%  35-39.9 kg/m2 Severe obesity (Class II) Increased incidence by 136%  >40 kg/m2 Extreme obesity (Class III) Increased incidence by 254%   Patient's current BMI Ideal Body weight  Body mass index is 36.61 kg/m. Ideal body weight: 57 kg (125 lb 10.6 oz) Adjusted ideal body weight: 74.1 kg (163 lb 6.4 oz)   BMI Readings from Last 4 Encounters:  12/29/20 36.61 kg/m  12/11/20 37.11 kg/m  11/28/20 37.11 kg/m  11/08/20 30.79 kg/m   Wt Readings from Last 4 Encounters:  12/29/20 220 lb (99.8 kg)  12/11/20 223 lb (101.2 kg)  11/28/20 223 lb (101.2 kg)  04/24/20 185 lb (83.9 kg)    Psych/Mental status: Alert, oriented x 3 (person, place, & time)       Eyes: PERLA Respiratory: No evidence of acute respiratory distress  Assessment  Primary Diagnosis &  Pertinent Problem List: The primary encounter diagnosis was Chronic low back pain (1ry area of Pain) (Bilateral) (L>R) w/o sciatica. Diagnoses of Lumbosacral facet syndrome (Bilateral), Lumbar facet hypertrophy (L4-5) (Bilateral), Lumbosacral facet arthropathy, Chronic sacroiliac joint pain (Bilateral), Somatic dysfunction of sacroiliac joints (Bilateral), Abnormal MRI, lumbar spine (05/06/2019), Chronic lower extremity pain (2ry area of Pain) (Bilateral) (L>R), Chronic pain syndrome, Pharmacologic therapy, Disorder of skeletal system, Problems influencing health status, History of marijuana use, Abnormal MRI, cervical spine, Abnormal MRI, thoracic spine, Gastroesophageal reflux disease with esophagitis and hemorrhage, and NSAID induced gastritis were also pertinent to this visit.  Visit Diagnosis (New problems to examiner): 1. Chronic low back pain (1ry area of Pain) (Bilateral) (L>R) w/o sciatica   2. Lumbosacral facet syndrome (Bilateral)   3. Lumbar facet hypertrophy (L4-5) (Bilateral)   4. Lumbosacral facet arthropathy   5. Chronic sacroiliac joint pain (Bilateral)   6. Somatic dysfunction of sacroiliac joints (Bilateral)   7. Abnormal MRI, lumbar spine (05/06/2019)   8. Chronic lower extremity pain (2ry area of Pain) (Bilateral) (L>R)   9. Chronic pain syndrome   10. Pharmacologic therapy   11. Disorder of skeletal system   12. Problems influencing health status   13. History of marijuana use   14. Abnormal MRI, cervical spine   15. Abnormal MRI, thoracic spine   16. Gastroesophageal reflux disease with esophagitis and hemorrhage   17. NSAID induced gastritis    Plan of Care (Initial workup plan)  Note: Ms. Steenson was reminded that as per protocol, today's visit has been an evaluation only. We have not taken over the patient's controlled substance management.  Problem-specific plan: No problem-specific Assessment & Plan notes found for this encounter.  Lab Orders         Compliance  Drug Analysis, Ur         Magnesium         Sedimentation rate         25-Hydroxy vitamin D Lcms D2+D3         C-reactive protein     Imaging Orders         DG Lumbar Spine Complete W/Bend         DG Si Joints     Referral Orders         Ambulatory referral to Physical Therapy     Procedure Orders         LUMBAR FACET(MEDIAL BRANCH NERVE BLOCK) MBNB     Pharmacotherapy (current): Medications ordered:  Meds  ordered this encounter  Medications   esomeprazole (NEXIUM) 40 MG capsule    Sig: Take 1 capsule (40 mg total) by mouth daily for 14 days.    Dispense:  14 capsule    Refill:  0    Medications administered during this visit: Kapri L. Mclinden had no medications administered during this visit.   Pharmacological management options:  Opioid Analgesics: The patient was informed that there is no guarantee that she would be a candidate for opioid analgesics. The decision will be made following CDC guidelines. This decision will be based on the results of diagnostic studies, as well as Ms. Mokry's risk profile.   Membrane stabilizer: To be determined at a later time  Muscle relaxant: To be determined at a later time  NSAID: To be determined at a later time  Other analgesic(s): To be determined at a later time   Interventional management options: Ms. Vaquerano was informed that there is no guarantee that she would be a candidate for interventional therapies. The decision will be based on the results of diagnostic studies, as well as Ms. Hor's risk profile.  Procedure(s) under consideration:  Pending results of ordered studies     Interventional Therapies  Risk  Complexity Considerations:   Estimated body mass index is 36.61 kg/m as calculated from the following:   Height as of this encounter: $RemoveBeforeD'5\' 5"'[id number]$  (1.651 m).   Weight as of this encounter: 220 lb (99.8 kg). WNL   Planned  Pending:   Diagnostic bilateral lumbar facet MBB #1    Under consideration:   Diagnostic  bilateral lumbar facet MBB #1  Diagnostic bilateral sacroiliac joint block #1    Completed by Girtha Hake, MD [facility] PMR):   Diagnostic/therapeutic bilateral L5 TFESI x2 (06/04/2019 & 08/02/2019) (no benefit & painful)    Completed:   None at this time   Therapeutic  Palliative (PRN) options:   None established    Provider-requested follow-up: Return for (Clinic) procedure: (B) L-FCT BLK #1, (Sed-anx).  Future Appointments  Date Time Provider [place]  01/06/2021  1:00 PM [facility] [place] [facility]  01/28/2021  3:35 PM Rise Mu, PA-C [facility]  02/12/2021  1:00 PM Jonathon Bellows, MD [facility] None    Note by: Gaspar Cola, MD Date: 12/29/2020; Time: 1:44 PM

## 2020-12-29 ENCOUNTER — Ambulatory Visit: Payer: Medicaid Other | Attending: Pain Medicine | Admitting: Pain Medicine

## 2020-12-29 ENCOUNTER — Other Ambulatory Visit: Payer: Self-pay

## 2020-12-29 ENCOUNTER — Encounter: Payer: Self-pay | Admitting: Pain Medicine

## 2020-12-29 VITALS — BP 153/92 | HR 86 | Temp 97.2°F | Resp 16 | Ht 65.0 in | Wt 220.0 lb

## 2020-12-29 DIAGNOSIS — Z79899 Other long term (current) drug therapy: Secondary | ICD-10-CM | POA: Diagnosis present

## 2020-12-29 DIAGNOSIS — M545 Low back pain, unspecified: Secondary | ICD-10-CM | POA: Diagnosis present

## 2020-12-29 DIAGNOSIS — M79605 Pain in left leg: Secondary | ICD-10-CM | POA: Insufficient documentation

## 2020-12-29 DIAGNOSIS — M899 Disorder of bone, unspecified: Secondary | ICD-10-CM | POA: Diagnosis present

## 2020-12-29 DIAGNOSIS — M47817 Spondylosis without myelopathy or radiculopathy, lumbosacral region: Secondary | ICD-10-CM

## 2020-12-29 DIAGNOSIS — F1291 Cannabis use, unspecified, in remission: Secondary | ICD-10-CM

## 2020-12-29 DIAGNOSIS — K2960 Other gastritis without bleeding: Secondary | ICD-10-CM | POA: Insufficient documentation

## 2020-12-29 DIAGNOSIS — K2101 Gastro-esophageal reflux disease with esophagitis, with bleeding: Secondary | ICD-10-CM

## 2020-12-29 DIAGNOSIS — M9904 Segmental and somatic dysfunction of sacral region: Secondary | ICD-10-CM | POA: Diagnosis present

## 2020-12-29 DIAGNOSIS — Z789 Other specified health status: Secondary | ICD-10-CM | POA: Diagnosis present

## 2020-12-29 DIAGNOSIS — G894 Chronic pain syndrome: Secondary | ICD-10-CM

## 2020-12-29 DIAGNOSIS — M533 Sacrococcygeal disorders, not elsewhere classified: Secondary | ICD-10-CM | POA: Diagnosis not present

## 2020-12-29 DIAGNOSIS — M47816 Spondylosis without myelopathy or radiculopathy, lumbar region: Secondary | ICD-10-CM

## 2020-12-29 DIAGNOSIS — K219 Gastro-esophageal reflux disease without esophagitis: Secondary | ICD-10-CM | POA: Insufficient documentation

## 2020-12-29 DIAGNOSIS — T39395A Adverse effect of other nonsteroidal anti-inflammatory drugs [NSAID], initial encounter: Secondary | ICD-10-CM | POA: Diagnosis present

## 2020-12-29 DIAGNOSIS — R937 Abnormal findings on diagnostic imaging of other parts of musculoskeletal system: Secondary | ICD-10-CM | POA: Diagnosis present

## 2020-12-29 DIAGNOSIS — M79604 Pain in right leg: Secondary | ICD-10-CM | POA: Insufficient documentation

## 2020-12-29 DIAGNOSIS — G8929 Other chronic pain: Secondary | ICD-10-CM | POA: Diagnosis present

## 2020-12-29 MED ORDER — ESOMEPRAZOLE MAGNESIUM 40 MG PO CPDR: 40.0000 mg | DELAYED_RELEASE_CAPSULE | Freq: Every day | ORAL | 0 refills | Status: DC

## 2020-12-29 NOTE — Addendum Note (Signed)
Addended by: Raelene Bott, Tajuana Kniskern L on: 12/29/2020 04:15 PM   Modules accepted: Orders

## 2020-12-29 NOTE — Patient Instructions (Signed)
______________________________________________________________________  Preparing for Procedure with Sedation  NOTICE: Due to recent regulatory changes, starting on August 25, 2020, procedures requiring intravenous (IV) sedation will no longer be performed at the [facility].  These types of procedures are required to be performed at [facility] ambulatory surgery facility.  We are very sorry for the inconvenience.  Procedure appointments are limited to planned procedures: No Prescription Refills. No disability issues will be discussed. No medication changes will be discussed.  Instructions: Oral Intake: Do not eat or drink anything for at least 8 hours prior to your procedure. (Exception: Blood Pressure Medication. See below.) Transportation: A driver is required. You may not drive yourself after the procedure. Blood Pressure Medicine: Do not forget to take your blood pressure medicine with a sip of water the morning of the procedure. If your Diastolic (lower reading) is above 100 mmHg, elective cases will be cancelled/rescheduled. Blood thinners: These will need to be stopped for procedures. Notify our staff if you are taking any blood thinners. Depending on which one you take, there will be specific instructions on how and when to stop it. Diabetics on insulin: Notify the staff so that you can be scheduled 1st case in the morning. If your diabetes requires high dose insulin, take only  of your normal insulin dose the morning of the procedure and notify the staff that you have done so. Preventing infections: Shower with an antibacterial soap the morning of your procedure. Build-up your immune system: Take 1000 mg of Vitamin C with every meal (3 times a day) the day prior to your procedure. Antibiotics: Inform the staff if you have a condition or reason that requires you to take antibiotics before dental procedures. Pregnancy: If you are pregnant, call and cancel the procedure. Sickness: If  you have a cold, fever, or any active infections, call and cancel the procedure. Arrival: You must be in the facility at least 30 minutes prior to your scheduled procedure. Children: Do not bring children with you. Dress appropriately: Bring dark clothing that you would not mind if they get stained. Valuables: Do not bring any jewelry or valuables.  Reasons to call and reschedule or cancel your procedure: (Following these recommendations will minimize the risk of a serious complication.) Surgeries: Avoid having procedures within 2 weeks of any surgery. (Avoid for 2 weeks before or after any surgery). Flu Shots: Avoid having procedures within 2 weeks of a flu shots. (Avoid for 2 weeks before or after immunizations). Barium: Avoid having a procedure within 7-10 days after having had a radiological study involving the use of radiological contrast. (Myelograms, Barium swallow or enema study). Heart attacks: Avoid any elective procedures or surgeries for the initial 6 months after a "Myocardial Infarction" (Heart Attack). Blood thinners: It is imperative that you stop these medications before procedures. Let us know if you if you take any blood thinner.  Infection: Avoid procedures during or within two weeks of an infection (including chest colds or gastrointestinal problems). Symptoms associated with infections include: Localized redness, fever, chills, night sweats or profuse sweating, burning sensation when voiding, cough, congestion, stuffiness, runny nose, sore throat, diarrhea, nausea, vomiting, cold or Flu symptoms, recent or current infections. It is specially important if the infection is over the area that we intend to treat. Heart and lung problems: Symptoms that may suggest an active cardiopulmonary problem include: cough, chest pain, breathing difficulties or shortness of breath, dizziness, ankle swelling, uncontrolled high or unusually low blood pressure, and/or palpitations. If you are    experiencing any of these symptoms, cancel your procedure and contact your primary care physician for an evaluation.  Remember:  Regular Business hours are:  Monday to Thursday 8:00 AM to 4:00 PM  Provider's Schedule: Celso Granja, MD:  Procedure days: Tuesday and Thursday 7:30 AM to 4:00 PM  Bilal Lateef, MD:  Procedure days: Monday and Wednesday 7:30 AM to 4:00 PM ______________________________________________________________________  ____________________________________________________________________________________________  General Risks and Possible Complications  Patient Responsibilities: It is important that you read this as it is part of your informed consent. It is our duty to inform you of the risks and possible complications associated with treatments offered to you. It is your responsibility as a patient to read this and to ask questions about anything that is not clear or that you believe was not covered in this document.  Patient's Rights: You have the right to refuse treatment. You also have the right to change your mind, even after initially having agreed to have the treatment done. However, under this last option, if you wait until the last second to change your mind, you may be charged for the materials used up to that point.  Introduction: Medicine is not an exact science. Everything in Medicine, including the lack of treatment(s), carries the potential for danger, harm, or loss (which is by definition: Risk). In Medicine, a complication is a secondary problem, condition, or disease that can aggravate an already existing one. All treatments carry the risk of possible complications. The fact that a side effects or complications occurs, does not imply that the treatment was conducted incorrectly. It must be clearly understood that these can happen even when everything is done following the highest safety standards.  No treatment: You can choose not to proceed with the  proposed treatment alternative. The "PRO(s)" would include: avoiding the risk of complications associated with the therapy. The "CON(s)" would include: not getting any of the treatment benefits. These benefits fall under one of three categories: diagnostic; therapeutic; and/or palliative. Diagnostic benefits include: getting information which can ultimately lead to improvement of the disease or symptom(s). Therapeutic benefits are those associated with the successful treatment of the disease. Finally, palliative benefits are those related to the decrease of the primary symptoms, without necessarily curing the condition (example: decreasing the pain from a flare-up of a chronic condition, such as incurable terminal cancer).  General Risks and Complications: These are associated to most interventional treatments. They can occur alone, or in combination. They fall under one of the following six (6) categories: no benefit or worsening of symptoms; bleeding; infection; nerve damage; allergic reactions; and/or death. No benefits or worsening of symptoms: In Medicine there are no guarantees, only probabilities. No healthcare provider can ever guarantee that a medical treatment will work, they can only state the probability that it may. Furthermore, there is always the possibility that the condition may worsen, either directly, or indirectly, as a consequence of the treatment. Bleeding: This is more common if the patient is taking a blood thinner, either prescription or over the counter (example: Goody Powders, Fish oil, Aspirin, Garlic, etc.), or if suffering a condition associated with impaired coagulation (example: Hemophilia, cirrhosis of the liver, low platelet counts, etc.). However, even if you do not have one on these, it can still happen. If you have any of these conditions, or take one of these drugs, make sure to notify your treating physician. Infection: This is more common in patients with a compromised  immune system, either due to disease (example:   diabetes, cancer, human immunodeficiency virus [HIV], etc.), or due to medications or treatments (example: therapies used to treat cancer and rheumatological diseases). However, even if you do not have one on these, it can still happen. If you have any of these conditions, or take one of these drugs, make sure to notify your treating physician. Nerve Damage: This is more common when the treatment is an invasive one, but it can also happen with the use of medications, such as those used in the treatment of cancer. The damage can occur to small secondary nerves, or to large primary ones, such as those in the spinal cord and brain. This damage may be temporary or permanent and it may lead to impairments that can range from temporary numbness to permanent paralysis and/or brain death. Allergic Reactions: Any time a substance or material comes in contact with our body, there is the possibility of an allergic reaction. These can range from a mild skin rash (contact dermatitis) to a severe systemic reaction (anaphylactic reaction), which can result in death. Death: In general, any medical intervention can result in death, most of the time due to an unforeseen complication. ____________________________________________________________________________________________  

## 2020-12-29 NOTE — Progress Notes (Signed)
Safety precautions to be maintained throughout the outpatient stay will include: orient to surroundings, keep bed in low position, maintain call bell within reach at all times, provide assistance with transfer out of bed and ambulation.  

## 2020-12-30 ENCOUNTER — Telehealth: Payer: Self-pay

## 2020-12-30 NOTE — Telephone Encounter (Signed)
Insurance denied facets. No PT and not enough documented conservative care.

## 2021-01-01 ENCOUNTER — Ambulatory Visit
Admission: RE | Admit: 2021-01-01 | Discharge: 2021-01-01 | Disposition: A | Discharge status: Home / Self Care | Payer: Medicaid Other | Source: Ambulatory Visit | Attending: Pain Medicine | Admitting: Pain Medicine

## 2021-01-01 ENCOUNTER — Ambulatory Visit
Admission: RE | Admit: 2021-01-01 | Discharge: 2021-01-01 | Disposition: A | Discharge status: Home / Self Care | Payer: Medicaid Other | Attending: Pain Medicine | Admitting: Pain Medicine

## 2021-01-01 DIAGNOSIS — M533 Sacrococcygeal disorders, not elsewhere classified: Secondary | ICD-10-CM | POA: Diagnosis present

## 2021-01-01 DIAGNOSIS — M9904 Segmental and somatic dysfunction of sacral region: Secondary | ICD-10-CM | POA: Insufficient documentation

## 2021-01-01 DIAGNOSIS — G8929 Other chronic pain: Secondary | ICD-10-CM | POA: Diagnosis present

## 2021-01-01 DIAGNOSIS — M79605 Pain in left leg: Secondary | ICD-10-CM | POA: Diagnosis present

## 2021-01-01 DIAGNOSIS — M47816 Spondylosis without myelopathy or radiculopathy, lumbar region: Secondary | ICD-10-CM

## 2021-01-01 DIAGNOSIS — M79604 Pain in right leg: Secondary | ICD-10-CM | POA: Diagnosis present

## 2021-01-01 DIAGNOSIS — M545 Low back pain, unspecified: Secondary | ICD-10-CM | POA: Diagnosis present

## 2021-01-01 DIAGNOSIS — M47817 Spondylosis without myelopathy or radiculopathy, lumbosacral region: Secondary | ICD-10-CM | POA: Diagnosis present

## 2021-01-04 LAB — SEDIMENTATION RATE: Sed Rate: 62 mm/hr — ABNORMAL HIGH (ref 0–32)

## 2021-01-04 LAB — 25-HYDROXY VITAMIN D LCMS D2+D3
25-Hydroxy, Vitamin D-2: <1 ng/mL
25-Hydroxy, Vitamin D-3: 8.9 ng/mL
25-Hydroxy, Vitamin D: 8.9 ng/mL — ABNORMAL LOW

## 2021-01-04 LAB — MAGNESIUM: Magnesium: 2.2 mg/dL (ref 1.6–2.3)

## 2021-01-04 LAB — C-REACTIVE PROTEIN: CRP: 8 mg/L (ref 0–10)

## 2021-01-04 LAB — COMPLIANCE DRUG ANALYSIS, UR

## 2021-01-06 ENCOUNTER — Other Ambulatory Visit: Payer: Medicaid Other

## 2021-01-07 ENCOUNTER — Other Ambulatory Visit (HOSPITAL_COMMUNITY): Payer: Self-pay | Admitting: *Deleted

## 2021-01-07 ENCOUNTER — Telehealth (HOSPITAL_COMMUNITY): Payer: Self-pay | Admitting: *Deleted

## 2021-01-07 MED ORDER — METOPROLOL TARTRATE 100 MG PO TABS: ORAL_TABLET | ORAL | 0 refills | Status: DC

## 2021-01-07 NOTE — Telephone Encounter (Signed)
Reaching out to patient to offer assistance regarding upcoming cardiac imaging study; pt verbalizes understanding of appt date/time, parking situation and where to check in, pre-test NPO status and medications ordered, and verified current allergies; name and call back number provided for further questions should they arise ° °Jackquelyn Sundberg RN Navigator Cardiac Imaging °[facility] °[phone] office °[phone] cell  ° °Patient to take 100mg metoprolol tartrate two hours prior to cardiac CT scan. °

## 2021-01-08 ENCOUNTER — Ambulatory Visit
Admission: RE | Admit: 2021-01-08 | Discharge: 2021-01-08 | Disposition: A | Discharge status: Home / Self Care | Payer: Medicaid Other | Source: Ambulatory Visit | Attending: Internal Medicine | Admitting: Internal Medicine

## 2021-01-08 ENCOUNTER — Other Ambulatory Visit: Payer: Self-pay

## 2021-01-08 DIAGNOSIS — R072 Precordial pain: Secondary | ICD-10-CM | POA: Insufficient documentation

## 2021-01-08 DIAGNOSIS — R011 Cardiac murmur, unspecified: Secondary | ICD-10-CM | POA: Insufficient documentation

## 2021-01-08 MED ORDER — NITROGLYCERIN 0.4 MG SL SUBL
0.8000 mg | SUBLINGUAL_TABLET | Freq: Once | SUBLINGUAL | Status: AC
  Administered 2021-01-08: 0.8 mg via SUBLINGUAL

## 2021-01-08 MED ORDER — IOHEXOL 350 MG/ML SOLN
100.0000 mL | Freq: Once | INTRAVENOUS | Status: AC | PRN
  Administered 2021-01-08: 100 mL via INTRAVENOUS

## 2021-01-09 ENCOUNTER — Other Ambulatory Visit (HOSPITAL_COMMUNITY): Payer: Self-pay | Admitting: *Deleted

## 2021-01-09 MED ORDER — METOPROLOL TARTRATE 100 MG PO TABS
ORAL_TABLET | ORAL | 0 refills | Status: DC
Start: 2021-01-09 — End: 2021-02-12

## 2021-01-15 ENCOUNTER — Ambulatory Visit: Payer: Medicaid Other | Attending: Pain Medicine

## 2021-01-15 DIAGNOSIS — G8929 Other chronic pain: Secondary | ICD-10-CM | POA: Diagnosis present

## 2021-01-15 DIAGNOSIS — M5442 Lumbago with sciatica, left side: Secondary | ICD-10-CM | POA: Diagnosis not present

## 2021-01-15 DIAGNOSIS — M6281 Muscle weakness (generalized): Secondary | ICD-10-CM | POA: Insufficient documentation

## 2021-01-15 NOTE — Therapy (Signed)
[facility] [address] [place], [address] Phone: [phone]   Fax:  [phone]  Physical Therapy Evaluation  Patient Details  Name: Cassidy Mathis MRN: [id number] Date of Birth: 14-Dec-1975 Referring Provider (PT): Dossie Arbour [place]   Encounter Date: 01/15/2021   PT End of Session - 01/15/21 1236     Visit Number 1    Number of Visits 17    Date for PT Re-Evaluation 03/12/21    PT Start Time 1115    PT Stop Time 0947    PT Time Calculation (min) 50 min    Activity Tolerance Patient limited by pain    Behavior During Therapy [facility] for tasks assessed/performed             Past Medical History:  Diagnosis Date   Allergy    seasonal   Anemia    h/o   Asthma    Cancer ([place])    cervical   Depression    GERD (gastroesophageal reflux disease)    RARE-NO MEDS   Headache    MIGRAINES   Hypertension    PT STATES SHE IS SUPPOSED TO BE TAKING LISINOPRIL-HCTZ BUT HAS BEEN OUT "FOR A WHILE" NEEDS TO GET ANOTHER PRESCRIPTION FROM HER PCP   Pancreatitis    Pilonidal cyst    TOOK ANTIBIOTIC THIS MONTH AND IT IS RESOLVED   Pneumonia 2018    Past Surgical History:  Procedure Laterality Date   ABDOMINAL HYSTERECTOMY     partial   CERVICAL CONIZATION W/BX N/A 03/23/2017   Procedure: CONIZATION CERVIX WITH BIOPSY;  Surgeon: Gillis Ends, MD;  Location: [facility] ORS;  Service: Gynecology;  Laterality: N/A;   CHOLECYSTECTOMY  10/21/2015   Procedure: LAPAROSCOPIC CHOLECYSTECTOMY WITH INTRAOPERATIVE CHOLANGIOGRAM;  Surgeon: Jules Husbands, MD;  Location: [facility] ORS;  Service: General;;   CHOLECYSTECTOMY Bilateral    COLONOSCOPY WITH PROPOFOL N/A 12/06/2020   Procedure: COLONOSCOPY WITH PROPOFOL;  Surgeon: Lesly Rubenstein, MD;  Location: [facility] ENDOSCOPY;  Service: Endoscopy;  Laterality: N/A;   CYST EXCISION     tongue AND WRIST   EPIGASTRIC HERNIA REPAIR N/A 04/18/2015   Procedure: HERNIA REPAIR  EPIGASTRIC ADULT;  Surgeon: Jules Husbands, MD;  Location: [facility]S;  Service: General;  Laterality: N/A;   ERCP N/A 12/21/2016   Procedure: ENDOSCOPIC RETROGRADE CHOLANGIOPANCREATOGRAPHY (ERCP);  Surgeon: [name], MD;  Location: [facility] ENDOSCOPY;  Service: Endoscopy;  Laterality: N/A;   ESOPHAGOGASTRODUODENOSCOPY  12/06/2020   Procedure: ESOPHAGOGASTRODUODENOSCOPY (EGD);  Surgeon: Lesly Rubenstein, MD;  Location: [facility] ENDOSCOPY;  Service: Endoscopy;;   GIVENS CAPSULE STUDY N/A 12/08/2020   Procedure: GIVENS CAPSULE STUDY;  Surgeon: Lin Landsman, MD;  Location[facility] ENDOSCOPY;  Service: Gastroenterology;  Laterality: N/A;   HERNIA REPAIR     LAPAROSCOPIC SALPINGO OOPHERECTOMY Right 01/01/2020   Procedure: LAPAROSCOPIC SALPINGO OOPHORECTOMY;  Surgeon: Schermerhorn, Gwen Her, MD;  Location: [facility] ORS;  Service: Gynecology;  Laterality: Right;   LYSIS OF ADHESION N/A 01/01/2020   Procedure: LYSIS OF ADHESION;  Surgeon: Schermerhorn, Gwen Her, MD;  Location: [facility] ORS;  Service: Gynecology;  Laterality: N/A;   RADICAL HYSTERECTOMY  04/14/2017   SALPINGECTOMY Bilateral 04/14/2017   SENTINEL LYMPH NODE BIOPSY     TUBAL LIGATION     VISCERAL ANGIOGRAPHY N/A 12/11/2020   Procedure: VISCERAL ANGIOGRAPHY;  Surgeon: Algernon Huxley, MD;  Location: [facility];  Service: Cardiovascular;  Laterality: N/A;    There were no vitals filed  for this visit.    Subjective Assessment - 01/15/21 1112     Subjective Pt is a 45 y.o. female presenting to OP PT for chronic Bilat LBP    Pertinent History Pt is a 45 y.o. female referred to OP PT for chronic LBP without radiculopathy. 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 Endorses pain across LBP radiating from buttocks to B LE's to ankles posteriorly. Endorses L >R, symptoms worsened with sitting, walking, laying (side sleeper and tummy sleeper), standing/walking. Endorses sleeping in supine in order to prop LE's up which does ease her symptoms. Pt is a Chartered certified accountant at [employer] and is limited in her job duties due to the bending, standing, walking required. Reports pain as burning, crampy, sharp in lower back and LE's. Pain in LE described as achey as well. Rpeorts having to constantly move LE's and rotate at torso for symptom management. Lowest recorded at 5/10 NPS, > 10/10 at worst, currently reporting 7.5/10 NPS. Injections and pain medicaitons have not been helpful. Rpeorts insurance auth requiring 4 PT visits before approving further injections, however pt is hopeful PT will improve symptoms and not coming to PT just for getting approved for furthre injections as in the past they have not been helpful. Endorses episode of low back spasm on Sunday that lasted ~1.5 hours where she also had chest tightness. Denies jaw pain, radiating arm pain, true chest pain, describes chest as sore or charlie horse. Endorses nothing has been improving her pain except resting LE's on pillows. Pt denies any significant weight loss, B/B changes, saddle anesthesia. Has had blood in stools in past but was d/c'd from hosputal ~2 weeks ago for GI bleed. Goal is to improve pain to improve ADL's and QoL.    Limitations  Sitting;House hold activities;Lifting;Standing;Walking    How long can you sit comfortably? 30 minutes max    How long can you stand comfortably? 10 minutes    How long can you walk comfortably? 5 minutes    Diagnostic tests X-rays    Patient Stated Goals Improve pain    Currently in Pain? Yes    Pain Score 7     Pain Location Leg    Pain Orientation Right;Left;Lower    Pain Descriptors / Indicators Aching;Burning;Grimacing;Guarding;Discomfort;Sharp;Cramping    Pain Type Chronic pain    Pain Radiating Towards L> R with radiation from low back to ankles    Pain Onset More than a month ago    Pain Frequency Constant    Aggravating Factors  Bending, extending, sitting, standing, walking, lifting    Pain Relieving Factors lying down with LE's elevated with pillows    Multiple Pain Sites Yes    Pain Location Ankle    Pain Orientation Left              OBJECTIVE  BP reading prior to eval: 145/102 mm Hg, HR:75  BPM    Mental Status Patient's fund of knowledge is within normal limits for educational level.   Gross Musculoskeletal Assessment Tremor: None Bulk: Normal Tone: Normal No visible step-off along spinal column   Gait L antalgic gait   Posture Lumbar lordosis: Significant lordosis with reduced thoracic kyphosis  Iliac crest height: equal bilaterally Lumbar lateral shift: negative    AROM (degrees) R/L (all movements no OP due to pain) Lumbar forward flexion (65): Limited 75-80%* Lumbar extension (30): Limited 50%* Lumbar lateral flexion (25): R: Limited 25%* L: 25%* Thoracic and Lumbar rotation (30 degrees):  R: WNL L: WNL Hip IR (0-45): WNL bilat, painful on L Hip ER (0-45): WNL bilat, painful on L Hip Flexion (0-125): Limited to 90 deg A/PROM*  *Indicates pain     Repeated Movements Not tested due to significant pain in both directions   Strength (out of 5) R/L 4*/3+* Hip flexion 4+/4* Hip ER 5/4-* Hip IR 4*/4* Hip abduction 4*/4* Hip  adduction 4/4 Knee extension 4/4- Ankle dorsiflexion 4+/4+ Ankle plantarflexion *Indicates pain   Sensation Grossly intact to light touch bilateral LEs as determined by testing dermatomes L2-S2. Proprioception and hot/cold testing deferred on this date.   Reflexes R/L 2+/2+ Knee Jerk (L3/4) 1+/1+ Ankle Jerk (S1/2)   Palpation Graded on 0-4 scale (0 = no pain, 1 = pain, 2 = pain with wincing/grimacing/flinching, 3 = pain with withdrawal, 4 = unwilling to allow palpation) Location LEFT  RIGHT           Lumbar paraspinals 2 2  Quadratus Lumborum 0 0  Gluteus Maximus 0 0  Gluteus Medius 2 2  Deep hip external rotators 3 3  PSIS 0 0  Fortin's Area (SIJ) 3 3  Greater Trochanter 0 0     Muscle Length Ely: Positive on L with hip flexion and concordant LBP. No pain on RLE   Passive Accessory Intervertebral Motion (PAIVM) Deferred due to significant pain with lumbar extension and sacral thrust.    SPECIAL TESTS Lumbar Radiculopathy and Discogenic: Centralization and Peripheralization (SN 92, -LR 0.12): Not done Slump (SN 83, -LR 0.32): R: Negative L: Positive SLR (SN 92, -LR 0.29): R: Not done L:  Not done Crossed SLR (SP 90): R: Not done L: Not done    Hip: FABER (SN 81): R: Negative L: Positive FADIR (SN 94): R: Positive L: Positive    SIJ:  Thigh Thrust (SN 88, -LR 0.18) : R: Positive L: Positive   Piriformis Syndrome: FAIR Test (SN 88, SP 83): R: Negative L: Positive   FOTO: 34 with target of 48  Neurogenic vs Vascular Claudication (Rieman, 2016)  Description Neurogenic Vascular  Quality of pain cramping Burning, cramping  LBP Frequently present Absent   Sensory symptoms Frequently present Absent  Muscle weakness Frequently present Absent  Reflex changes Frequently present Absent  Bicycle test Symptoms only when sitting upright Symptoms not position dependent  Arterial pulses Normal  Decreased or absent  Skin/dystrophic changes Absent  Frequently  present  Aggravating factors Upright posture, extension of trunk Any leg activity  Relieving factors Sitting, bending forward Rest, no leg activity  Walking uphill Symptoms produced later Not position dependent  Walking downhill Symptoms produced earlier Not position dependent    Lumbar Spinal Stenosis vs Radiculopathy due to Disc Herniation (Rieman, 2016)  Description Spinal Stenosis Disc Herniation  Age Usually > 41 y/o Usually < 31 y/o  Onset Usually more insidious Usually more sudden  Position change: flexion Better  Worse  Position change: extension Worse Better  Focal muscle weakness Less common Can be common  Dural tension Less common Common  UMN or LMN involvement  Central stenosis: UMN Lateral foraminal stenosis: LMN LMN         [facility] PT Assessment - 01/15/21 1234       Assessment   Medical Diagnosis LBP    Referring Provider (PT) Dossie Arbour, [place]    Onset Date/Surgical Date --   ~2.5 years ago   Prior Therapy No      Precautions   Precautions None      Restrictions   Weight Bearing Restrictions No      Prior Function   Level of Independence Independent    Vocation Full time employment    Vocation Requirements Bending, standing, lifting, walking, cleaning.      Cognition   Overall Cognitive Status Within Functional Limits for tasks assessed      Sensation   Light Touch Appears Intact      Deep Tendon Reflexes   DTR Assessment Site Patella;Achilles    Patella DTR 2+    Achilles DTR 1+                        Objective measurements completed on examination: See above findings.                PT Education - 01/15/21 1236     Education Details HEP, POC    Person(s) Educated Patient    Methods Explanation;Demonstration;Tactile cues;Verbal cues;Handout    Comprehension Verbalized understanding;Need further instruction              PT Short Term Goals - 01/15/21 1247       PT SHORT TERM GOAL #1   Title Pt will be  independent with HEP to improve pain, mobility and strength for ADL completion.    Baseline 12/22: initiated    Time 4    Period Weeks    Status New    Target Date 02/12/21               PT Long Term Goals - 01/15/21 1248       PT LONG TERM GOAL #1   Title Pt will improve FOTO to target score to display improvements in functional mobility    Baseline 12/22: 34 with target of 48    Time 8    Period Weeks    Status New    Target Date 03/12/21      PT LONG TERM GOAL #2   Title Pt will display ability to bend over and pick item off floor with < 5/10 pain NPS to demo improved ability to perform job tasks.    Baseline 12/22: limited in lumbar flexion > 75%. UNable to pick items up from floor at work    Time 8    Period Weeks    Status New    Target Date 03/12/21      PT LONG TERM GOAL #3   Title Pt will complete 5xSTS in < 12 sec to demonstrate clinically significant improvement in LE strength for standing and walking ADL completion    Baseline 12/22: Deferred due to elevated BP. perform next session.    Time 8    Period Weeks    Status New    Target Date 03/12/21      PT LONG TERM GOAL #4   Title Pt will display gross 4+/5 MMT in BLE's with no pain  to demo improvement in Low back symptoms and LE strength.    Baseline 12/22: Limited on L from 3+ to 4-/5 and 4/5 on RLE    Time 8    Period Weeks    Status New    Target Date 03/12/21                    Plan - 01/15/21 1237     Clinical Impression Statement Pt is a pleasant 45 y.o. female with extensive PMH and present for LBP. Pt presenting with multiple deficits such as significant LBP with all lumbar motions (except rotation) with significant restrictions in AROM and LE weakness due to pain, positive SLUMP test on LLE and FABIR/FADDIR for L hip concordant with LBP which is negative on R side. Pt also has positive SIJ pain provocation tests for thigh thrust, sacral thrust and compression but does indicate relief  with distraction. TTP at B lumbar paraspinals from S1 to approximately T12 and reproduction of radicular symptoms with deep palpation to hip rotators/piriformis L >R. Postive Ely's test on L, negative on R with pt standing/sitting with increased lumbar lordosis and reduce thoracic kyphosis with reproduction of pain with hip flexor stretching and lumbar extension. These impairments are limiting pt from performing bending/lifting/standing/walking work tasks and ADL's in home and community environment leading to significant pain and dysfunction. Will benefit from skilled PT services for improved LE strength, pain, and mobility to return to PLOF.    Personal Factors and Comorbidities Age;Comorbidity 3+;Education;Past/Current Experience;Profession;Time since onset of injury/illness/exacerbation    Comorbidities see subjective    Examination-Activity Limitations Squat;Bed Mobility;Bend;Lift;Locomotion Level;Stand;Reach Overhead;Carry;Sleep;Sit    Examination-Participation Restrictions Laundry;Cleaning;Shop;Community Activity;Occupation    Stability/Clinical Decision Making Evolving/Moderate complexity    Clinical Decision Making Moderate    Rehab Potential Fair    PT Frequency 2x / week    PT Duration 8 weeks    PT Treatment/Interventions ADLs/Self Care Home Management;Aquatic Therapy;Canalith Repostioning;Cryotherapy;Electrical Stimulation;Moist Heat;Traction;Gait training;Stair training;Functional mobility training;Therapeutic activities;Patient/family education;Therapeutic exercise;Balance training;Neuromuscular re-education;Manual techniques;Dry needling;Passive range of motion;Spinal Manipulations;Vestibular;Joint Manipulations    PT Next Visit Plan Reassess HEP; TAKE BP; 5xSTS may be good for better assessment of LE strength if BP is improved.    PT Home Exercise Plan Access Code: [email or web address]    Consulted and Agree with Plan of Care Patient             Patient will benefit from skilled therapeutic  intervention in order to improve the following deficits and impairments:  Abnormal gait, Decreased mobility, Cardiopulmonary status limiting activity, Increased muscle spasms, Postural dysfunction, Pain, Improper body mechanics, Decreased range of motion, Decreased strength, Hypomobility, Impaired flexibility  Visit Diagnosis: Chronic bilateral low back pain with left-sided sciatica  Muscle weakness (generalized)     Problem List Patient Active Problem List   Diagnosis Date Noted   Chronic pain syndrome 12/29/2020   Pharmacologic therapy 12/29/2020   Disorder of skeletal system 12/29/2020   Problems influencing health status 12/29/2020   History of marijuana use 12/29/2020   Abnormal MRI, cervical spine (05/06/2019) 12/29/2020   Abnormal MRI, thoracic spine (05/06/2019) 12/29/2020   Abnormal MRI, lumbar spine (05/06/2019) 12/29/2020   GERD (gastroesophageal reflux disease) 12/29/2020   NSAID induced gastritis 12/29/2020   Chronic low back pain (1ry area of Pain) (Bilateral) (L>R) w/o sciatica 12/29/2020   Chronic lower extremity pain (2ry area of Pain) (Bilateral) (L>R) 12/29/2020   Chronic sacroiliac joint pain (Bilateral) 12/29/2020   Lumbosacral facet syndrome (Bilateral) 12/29/2020   Lumbar facet  hypertrophy (L4-5) (Bilateral) 12/29/2020   Lumbosacral facet arthropathy 12/29/2020   Somatic dysfunction of sacroiliac joints (Bilateral) 12/29/2020   Hypokalemia 12/11/2020   Acute blood loss anemia 12/07/2020   Vitamin B12 deficiency 12/06/2020   Rectal bleeding 12/04/2020   Precordial pain 11/28/2020   Palpitations 11/28/2020   Heart murmur 11/28/2020   Uncontrolled hypertension 11/28/2020   Right ovarian cyst    Acute bilateral low back pain with bilateral sciatica 06/01/2019   Foraminal stenosis of lumbar region 06/01/2019   Cervical cancer, FIGO stage IB1 ([place]) 05/17/2017   Ileus ([place]) 04/18/2017   Postoperative anemia due to acute blood loss 04/18/2017   Tobacco use  04/06/2017   Malignant neoplasm of overlapping sites of cervix ([place]) 04/06/2017   Malignant neoplasm of exocervix ([facility])    Common bile duct stone    Pancreatitis 12/16/2016   Calculus of gallbladder with chronic cholecystitis without obstruction    Incarcerated epigastric hernia     Salem Caster. Fairly IV, PT, DPT Physical Therapist- [facility]  01/15/2021, 1:28 PM  [facility] [address] [place], [address] Phone: [phone]   Fax:  [phone]  Name: Cassidy Mathis MRN: [id number] Date of Birth: 1975-08-14

## 2021-01-16 ENCOUNTER — Telehealth: Payer: Self-pay

## 2021-01-16 NOTE — Telephone Encounter (Signed)
We have received insurance papers for disability twice we cant fill those out as she hasnt had a followup appointment with our providers yet she has appointment end of January so I faxed papers back to insurance company however patient has followed up with her pcp since hospital

## 2021-01-21 ENCOUNTER — Telehealth (HOSPITAL_COMMUNITY): Payer: Self-pay | Admitting: *Deleted

## 2021-01-21 NOTE — Telephone Encounter (Signed)
Reaching out to patient to offer assistance regarding upcoming cardiac imaging study; pt verbalizes understanding of appt date/time, parking situation and where to check in, pre-test NPO status and medications ordered, and verified current allergies; name and call back number provided for further questions should they arise ° °Waris Rodger RN Navigator Cardiac Imaging °[facility] °[phone] office °[phone] cell  ° °Patient to take 100mg metoprolol tartrate two hours prior to cardiac CT scan. °

## 2021-01-22 ENCOUNTER — Ambulatory Visit: Admission: RE | Admit: 2021-01-22 | Payer: Medicaid Other | Source: Ambulatory Visit

## 2021-01-28 ENCOUNTER — Ambulatory Visit: Payer: Medicaid Other | Admitting: Physician Assistant

## 2021-02-02 ENCOUNTER — Ambulatory Visit: Payer: Medicaid Other | Admitting: Physical Therapy

## 2021-02-04 ENCOUNTER — Encounter: Payer: Medicaid Other | Admitting: Physical Therapy

## 2021-02-05 ENCOUNTER — Telehealth (HOSPITAL_COMMUNITY): Payer: Self-pay | Admitting: Emergency Medicine

## 2021-02-05 NOTE — Telephone Encounter (Signed)
Unable to leave vm Marjo Grosvenor RN Navigator Cardiac Imaging [facility] [phone] Office  [phone] Cell  

## 2021-02-09 ENCOUNTER — Ambulatory Visit: Admission: RE | Admit: 2021-02-09 | Payer: Medicaid Other | Source: Ambulatory Visit

## 2021-02-10 ENCOUNTER — Ambulatory Visit: Payer: Medicaid Other | Attending: Pain Medicine | Admitting: Physical Therapy

## 2021-02-10 ENCOUNTER — Encounter: Payer: Self-pay | Admitting: Physical Therapy

## 2021-02-10 DIAGNOSIS — M6281 Muscle weakness (generalized): Secondary | ICD-10-CM | POA: Insufficient documentation

## 2021-02-10 DIAGNOSIS — M5442 Lumbago with sciatica, left side: Secondary | ICD-10-CM | POA: Diagnosis present

## 2021-02-10 DIAGNOSIS — G8929 Other chronic pain: Secondary | ICD-10-CM | POA: Insufficient documentation

## 2021-02-10 NOTE — Therapy (Signed)
[place] [facility] [address], [place], [address] Phone: [phone]   Fax:  [phone]  Physical Therapy Treatment  Patient Details  Name: Cassidy Mathis MRN: [id number] Date of Birth: 13-Jan-1976 Referring Provider (PT): Milinda Pointer   Encounter Date: 02/10/2021   PT End of Session - 02/11/21 1517     Visit Number 2    Number of Visits 17    Date for PT Re-Evaluation 03/12/21    Authorization - Visit Number 1    Authorization - Number of Visits 10    PT Start Time 1950    PT Stop Time 1600    PT Time Calculation (min) 45 min    Activity Tolerance Patient limited by pain    Behavior During Therapy [facility] for tasks assessed/performed             Past Medical History:  Diagnosis Date   Allergy    seasonal   Anemia    h/o   Asthma    Cancer ([place])    cervical   Depression    GERD (gastroesophageal reflux disease)    RARE-NO MEDS   Headache    MIGRAINES   Hypertension    PT STATES SHE IS SUPPOSED TO BE TAKING LISINOPRIL-HCTZ BUT HAS BEEN OUT "FOR A WHILE" NEEDS TO GET ANOTHER PRESCRIPTION FROM HER PCP   Pancreatitis    Pilonidal cyst    TOOK ANTIBIOTIC THIS MONTH AND IT IS RESOLVED   Pneumonia 2018    Past Surgical History:  Procedure Laterality Date   ABDOMINAL HYSTERECTOMY     partial   CERVICAL CONIZATION W/BX N/A 03/23/2017   Procedure: CONIZATION CERVIX WITH BIOPSY;  Surgeon: Gillis Ends, MD;  Location: [facility] ORS;  Service: Gynecology;  Laterality: N/A;   CHOLECYSTECTOMY  10/21/2015   Procedure: LAPAROSCOPIC CHOLECYSTECTOMY WITH INTRAOPERATIVE CHOLANGIOGRAM;  Surgeon: Jules Husbands, MD;  Location: [facility] ORS;  Service: General;;   CHOLECYSTECTOMY Bilateral    COLONOSCOPY WITH PROPOFOL N/A 12/06/2020   Procedure: COLONOSCOPY WITH PROPOFOL;  Surgeon: Lesly Rubenstein, MD;  Location: [facility] ENDOSCOPY;  Service: Endoscopy;  Laterality: N/A;   CYST EXCISION     tongue AND  WRIST   EPIGASTRIC HERNIA REPAIR N/A 04/18/2015   Procedure: HERNIA REPAIR EPIGASTRIC ADULT;  Surgeon: Jules Husbands, MD;  Location: [facility] ORS;  Service: General;  Laterality: N/A;   ERCP N/A 12/21/2016   Procedure: ENDOSCOPIC RETROGRADE CHOLANGIOPANCREATOGRAPHY (ERCP);  Surgeon: [name], MD;  Location: [facility] ENDOSCOPY;  Service: Endoscopy;  Laterality: N/A;   ESOPHAGOGASTRODUODENOSCOPY  12/06/2020   Procedure: ESOPHAGOGASTRODUODENOSCOPY (EGD);  Surgeon: Lesly Rubenstein, MD;  Location: [facility] ENDOSCOPY;  Service: Endoscopy;;   GIVENS CAPSULE STUDY N/A 12/08/2020   Procedure: GIVENS CAPSULE STUDY;  Surgeon: Lin Landsman, MD;  Location: [facility] ENDOSCOPY;  Service: Gastroenterology;  Laterality: N/A;   HERNIA REPAIR     LAPAROSCOPIC SALPINGO OOPHERECTOMY Right 01/01/2020   Procedure: LAPAROSCOPIC SALPINGO OOPHORECTOMY;  Surgeon: Schermerhorn, Gwen Her, MD;  Location: [facility] ORS;  Service: Gynecology;  Laterality: Right;   LYSIS OF ADHESION N/A 01/01/2020   Procedure: LYSIS OF ADHESION;  Surgeon: Schermerhorn, Gwen Her, MD;  Location: [facility] ORS;  Service: Gynecology;  Laterality: N/A;   RADICAL HYSTERECTOMY  04/14/2017   SALPINGECTOMY Bilateral 04/14/2017   SENTINEL LYMPH NODE BIOPSY     TUBAL LIGATION     VISCERAL ANGIOGRAPHY N/A 12/11/2020   Procedure: VISCERAL ANGIOGRAPHY;  Surgeon: Algernon Huxley, MD;  Location: [facility]  INVASIVE CV LAB;  Service: Cardiovascular;  Laterality: N/A;    There were no vitals filed for this visit.   Subjective Assessment - 02/10/21 1523     Subjective Pt returns to PT following month absence. She has sharp burning pain across LB with radiation down post BLE to feet. Current pain is 9/10. Pt reports she has completed her HEP regularly over the past 4 weeks with no pain relief.    Pertinent History Pt is a 46 y.o. female referred to OP PT for chronic LBP without radiculopathy. 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 Endorses pain across LBP radiating from buttocks to B LE's to ankles posteriorly. Endorses L >R, symptoms worsened with sitting, walking, laying (side sleeper and tummy sleeper), standing/walking. Endorses sleeping in supine in order to prop LE's up which does ease her symptoms. Pt is a Chartered certified accountant at [employer] and is limited in her job duties due to the bending, standing, walking required. Reports pain as burning, crampy, sharp in lower back and LE's. Pain in LE described as achey as well. Rpeorts having to constantly move LE's and rotate at torso for symptom management. Lowest recorded at 5/10 NPS, > 10/10 at worst, currently reporting 7.5/10 NPS. Injections and pain medicaitons have not been helpful. Rpeorts insurance auth requiring 4 PT visits before approving further injections, however pt is hopeful PT will improve symptoms and not coming to PT just for getting approved for furthre injections as in the past they have not been helpful. Endorses episode of low back spasm on Sunday that lasted ~1.5 hours where she also had chest tightness. Denies jaw pain, radiating arm pain, true chest pain, describes chest as sore or charlie horse. Endorses nothing has been improving her pain except resting  LE's on pillows. Pt denies any significant weight loss, B/B changes, saddle anesthesia. Has had blood in stools in past but was d/c'd from hosputal ~2 weeks ago for GI bleed. Goal is to improve pain to improve ADL's and QoL.    Limitations Sitting;House hold activities;Lifting;Standing;Walking    How long can you sit comfortably? 30 minutes max    How long can you stand comfortably? 10 minutes    How long can you walk comfortably? 5 minutes    Diagnostic tests X-rays    Patient Stated Goals Improve pain    Pain Onset More than a month ago               Ther-Ex Nustep L1 for gentle lumbar mobility and strengthening  BP taken following machine 155/88 BP 82mins following manually 147/84 5xSTS 17sec with patient reporting increased pain to LB Lumbar flex and ext WNL AROM with increased pain with forward flexion, feels "good" with lumbar ext Increased radicular sx down LLE to foot with palpation over piriformis (deep palpation), FAIR test, and slump test- resolution each time with use of prone prop  and lumbar ext All of above tests neg for RLE but reports achy pain that is more occassional PT educated patient on lumbar radiculopathy with LLE radiation and RLE compensatory pain/achiness with education on centralization and need for consistent repeated movements for success with this with understanding   PT reviewed the following HEP with patient with patient able to demonstrate a set of the following with min cuing for correction needed. PT educated patient on parameters of therex (how/when to inc/decrease intensity, frequency, rep/set range, stretch hold time, and purpose of therex) with verbalized understanding.  Access Code: [email or web address] Standing Lumbar Extension - 8 x daily - 7 x weekly - 12-20 reps Seated Slump Nerve Glide - 5 x daily - 7 x weekly - 12-20 reps Supine Lower Trunk Rotation - 2 x daily - 7 x weekly - 15-20 reps - 2-3 hold Hooklying Single Knee to Chest Stretch - 2 x daily - 7  x weekly - 3 reps - 5sec hold    Eudcaiton on BP management and the importance of patient following up on this in order to make therapy effective.                      PT Education - 02/11/21 1517     Education Details POC update, HEP update    Person(s) Educated Patient    Methods Explanation;Demonstration;Verbal cues;Handout    Comprehension Verbalized understanding;Verbal cues required;Returned demonstration              PT Short Term Goals - 01/15/21 1247       PT SHORT TERM GOAL #1   Title Pt will be independent with HEP to improve pain, mobility and strength for ADL completion.    Baseline 12/22: initiated    Time 4    Period Weeks    Status New    Target Date 02/12/21               PT Long Term Goals - 02/10/21 1525       PT LONG TERM GOAL #1   Title Pt will improve FOTO to target score of 48 to display improvements in functional mobility    Baseline 12/22: 34 with target of 48; 02/10/21 30    Time 8    Period Weeks    Status On-going      PT LONG TERM GOAL #2   Title Pt will display ability to bend over and pick item off floor with < 5/10 pain NPS to demo improved ability to perform job tasks.    Baseline 12/22: limited in lumbar flexion > 75%. UNable to pick items up from floor at work; 02/10/21 full flex with 9/10 pain    Time 8    Period Weeks    Status On-going      PT LONG TERM GOAL #3   Title Pt will complete 5xSTS in < 12 sec to demonstrate clinically significant improvement in LE strength for standing and walking ADL completion    Baseline 12/22: Deferred due to elevated BP. perform next session; 02/10/21 17sec    Time 8    Period Weeks    Status On-going      PT LONG TERM GOAL #4   Title Pt will display gross 4+/5 MMT in BLE's with no pain to demo improvement in Low back symptoms and LE strength.    Baseline 12/22: Limited on L from 3+ to 4-/5 and 4/5 on RLE; 02/10/21    Time 8  Period Weeks    Status On-going                    Plan - 02/11/21 1524     Clinical Impression Statement PT reassessed patient this session as she has been absent from PT for a month. PT further honed in diagnosis from original evaluation, with conclusion of L sided lumbar rediculopathy with R hip compensatory pain. Patient responds very well to repeated ext for centralization of pain with evident periphrealization with flexion. PT educated patient on centralization concept with education on consistent repeated ext for this, with excellent understanding. PT updated HEP with patient able to demonstrate understanding of all therex without pain. PT educated patient on parameters of updated HEP with good understanding. PT will continue progression as able.    Personal Factors and Comorbidities Age;Comorbidity 3+;Education;Past/Current Experience;Profession;Time since onset of injury/illness/exacerbation    Comorbidities see subjective    Examination-Activity Limitations Squat;Bed Mobility;Bend;Lift;Locomotion Level;Stand;Reach Overhead;Carry;Sleep;Sit    Examination-Participation Restrictions Laundry;Cleaning;Shop;Community Activity;Occupation    Stability/Clinical Decision Making Evolving/Moderate complexity    Clinical Decision Making Moderate    Rehab Potential Fair    PT Frequency 2x / week    PT Duration 8 weeks    PT Treatment/Interventions ADLs/Self Care Home Management;Aquatic Therapy;Canalith Repostioning;Cryotherapy;Electrical Stimulation;Moist Heat;Traction;Gait training;Stair training;Functional mobility training;Therapeutic activities;Patient/family education;Therapeutic exercise;Balance training;Neuromuscular re-education;Manual techniques;Dry needling;Passive range of motion;Spinal Manipulations;Vestibular;Joint Manipulations    PT Next Visit Plan Reassess HEP; TAKE BP    PT Home Exercise Plan Access Code: [email or web address]    Consulted and Agree with Plan of Care Patient             Patient will benefit from skilled  therapeutic intervention in order to improve the following deficits and impairments:  Abnormal gait, Decreased mobility, Cardiopulmonary status limiting activity, Increased muscle spasms, Postural dysfunction, Pain, Improper body mechanics, Decreased range of motion, Decreased strength, Hypomobility, Impaired flexibility  Visit Diagnosis: Chronic bilateral low back pain with left-sided sciatica  Muscle weakness (generalized)     Problem List Patient Active Problem List   Diagnosis Date Noted   Chronic pain syndrome 12/29/2020   Pharmacologic therapy 12/29/2020   Disorder of skeletal system 12/29/2020   Problems influencing health status 12/29/2020   History of marijuana use 12/29/2020   Abnormal MRI, cervical spine (05/06/2019) 12/29/2020   Abnormal MRI, thoracic spine (05/06/2019) 12/29/2020   Abnormal MRI, lumbar spine (05/06/2019) 12/29/2020   GERD (gastroesophageal reflux disease) 12/29/2020   NSAID induced gastritis 12/29/2020   Chronic low back pain (1ry area of Pain) (Bilateral) (L>R) w/o sciatica 12/29/2020   Chronic lower extremity pain (2ry area of Pain) (Bilateral) (L>R) 12/29/2020   Chronic sacroiliac joint pain (Bilateral) 12/29/2020   Lumbosacral facet syndrome (Bilateral) 12/29/2020   Lumbar facet hypertrophy (L4-5) (Bilateral) 12/29/2020   Lumbosacral facet arthropathy 12/29/2020   Somatic dysfunction of sacroiliac joints (Bilateral) 12/29/2020   Hypokalemia 12/11/2020   Acute blood loss anemia 12/07/2020   Vitamin B12 deficiency 12/06/2020   Rectal bleeding 12/04/2020   Precordial pain 11/28/2020   Palpitations 11/28/2020   Heart murmur 11/28/2020   Uncontrolled hypertension 11/28/2020   Right ovarian cyst    Acute bilateral low back pain with bilateral sciatica 06/01/2019   Foraminal stenosis of lumbar region 06/01/2019   Cervical cancer, FIGO stage IB1 ([place]) 05/17/2017   Ileus ([place]) 04/18/2017   Postoperative anemia due to acute blood loss 04/18/2017    Tobacco use 04/06/2017   Malignant neoplasm of overlapping sites of cervix ([place]) 04/06/2017   Malignant  neoplasm of exocervix ([place])    Common bile duct stone    Pancreatitis 12/16/2016   Calculus of gallbladder with chronic cholecystitis without obstruction    Incarcerated epigastric hernia    Durwin Reges DPT Durwin Reges, PT 02/11/2021, 4:07 PM  [place] [facility] [address] [place], [address] Phone: [phone]   Fax:  [phone]  Name: MAILANI DEGROOTE MRN: [id number] Date of Birth: 05-13-1975

## 2021-02-12 ENCOUNTER — Ambulatory Visit (INDEPENDENT_AMBULATORY_CARE_PROVIDER_SITE_OTHER): Payer: Medicaid Other | Admitting: Gastroenterology

## 2021-02-12 ENCOUNTER — Encounter: Payer: Medicaid Other | Admitting: Physical Therapy

## 2021-02-12 ENCOUNTER — Encounter: Payer: Self-pay | Admitting: Gastroenterology

## 2021-02-12 VITALS — BP 137/80 | HR 83 | Temp 98.3°F | Ht 65.0 in | Wt 230.8 lb

## 2021-02-12 DIAGNOSIS — Z8719 Personal history of other diseases of the digestive system: Secondary | ICD-10-CM | POA: Diagnosis not present

## 2021-02-12 DIAGNOSIS — Z791 Long term (current) use of non-steroidal anti-inflammatories (NSAID): Secondary | ICD-10-CM | POA: Diagnosis not present

## 2021-02-12 DIAGNOSIS — K9089 Other intestinal malabsorption: Secondary | ICD-10-CM

## 2021-02-12 DIAGNOSIS — R197 Diarrhea, unspecified: Secondary | ICD-10-CM

## 2021-02-12 MED ORDER — CHOLESTYRAMINE LIGHT 4 G PO PACK: 4.0000 g | PACK | Freq: Two times a day (BID) | ORAL | 0 refills | Status: DC | PRN

## 2021-02-12 NOTE — Addendum Note (Signed)
Addended by: Wayna Chalet on: 02/12/2021 01:29 PM   Modules accepted: Orders

## 2021-02-12 NOTE — Progress Notes (Signed)
Cassidy Bellows MD, MRCP(U.K) [address]  [place]  [place], [place] [address]  Main: [phone]  Fax: [phone]   Primary Care Physician: [facility] [place]  Primary Gastroenterologist:  Dr. Jonathon Mathis   C/c : Follow-up for history of GI bleed   HPI: Cassidy Mathis is a 46 y.o. female   Summary of history : She has previously been seen by me back in 2020 January for abnormal LFTs.  History of pancreatitis attributed to gallstones back in 2017 2018.  MRCP in 2018 showed choledocholithiasis and underwent an ERCP.  CK elevated in the past.  She was admitted in November 2022 and seen by Dr. Marius Ditch for rectal bleeding.  Hemoglobin was 9 g down from a baseline of 13 g.  Underwent a CT angiogram which did not show any active bleeding.Good morning during hospitalization the patient had 2 CT angiograms, Meckel scan, tagged RBC scan, EGD colonoscopy which were all negative for any source of bleed.  Capsule study of the small bowel on 12/07/2020 demonstrated blood in the entire colon no active bleeding was seen.   Interval history 12/14/2020-02/12/2021  Since discharge denies any further bleeding.  She states that she has stopped using any NSAIDs which she was using on a long-term basis in the past for headaches.  She has had a lot of bloating.  Distention and diarrhea since discharge.  He states that she had had diarrhea for many years after her gallbladder surgery but this is worse than baseline.  Crampy abdominal pain in the lower part of the abdomen.  Consumes up to 5 sodas a day regular. Current Outpatient Medications  Medication Sig Dispense Refill   albuterol (PROVENTIL HFA;VENTOLIN HFA) 108 (90 Base) MCG/ACT inhaler Inhale 2 puffs into the lungs every 6 (six) hours as needed for wheezing or shortness of breath. 1 Inhaler 2   amLODipine (NORVASC) 5 MG tablet Take 1 tablet (5 mg total) by mouth daily. 30 tablet 1   gabapentin (NEURONTIN) 300 MG capsule Take  300 mg by mouth at bedtime.     lisinopril (ZESTRIL) 40 MG tablet Take 1 tablet (40 mg total) by mouth every morning. 30 tablet 1   vitamin B-12 1000 MCG tablet Take 1 tablet (1,000 mcg total) by mouth daily. 30 tablet 0   No current facility-administered medications for this visit.    Allergies as of 02/12/2021   (No Known Allergies)    ROS:  General: Negative for anorexia, weight loss, fever, chills, fatigue, weakness. ENT: Negative for hoarseness, difficulty swallowing , nasal congestion. CV: Negative for chest pain, angina, palpitations, dyspnea on exertion, peripheral edema.  Respiratory: Negative for dyspnea at rest, dyspnea on exertion, cough, sputum, wheezing.  GI: See history of present illness. GU:  Negative for dysuria, hematuria, urinary incontinence, urinary frequency, nocturnal urination.  Endo: Negative for unusual weight change.    Physical Examination:   BP 137/80    Pulse 83    Temp 98.3 F (36.8 C) (Oral)    Ht 5\' 5"  (1.651 m)    Wt 230 lb 12.8 oz (104.7 kg)    LMP 03/18/2017 (Exact Date)    BMI 38.41 kg/m   General: Well-nourished, well-developed in no acute distress.  Eyes: No icterus. Conjunctivae pink. Neuro: Alert and oriented x 3.  Grossly intact. Skin: Warm and dry, no jaundice.   Psych: Alert and cooperative, normal mood and affect.   Imaging Studies: No results found.  Assessment and Plan:  Cassidy Mathis is a 46 y.o. y/o female was admitted back in November 2022 with overt obscure GI bleed.  EGD, colonoscopy, CT angiograms, tagged RBC scan did not reveal a source of bleeding.  Blood was seen in the cecum on capsule study of small bowel.  Nuclear study was negative.  Very likely had a GI bleed secondary to long-term NSAID use which she has stopped.  Acute diarrhea on chronic likely secondary to osmotic diarrhea secondary to excess consumption of sodas.   1.  Check hemoglobin to ensure no further drop.  Check iron studies as she complains of  some fatigue 2.  Stop all consumption of sodas as a trial for a week to see if the diarrhea improves 3.  Stool studies to rule out infection and commence on Questran 1 packet up to twice a day for bile salt mediated diarrhea.    Dr Cassidy Bellows  MD,MRCP [facility]) Follow up in as needed if symptoms do not get better or get worse

## 2021-02-12 NOTE — Patient Instructions (Signed)
Please stop drinking soda pops.

## 2021-02-12 NOTE — Progress Notes (Signed)
Created new password for my chart

## 2021-02-13 ENCOUNTER — Encounter (HOSPITAL_COMMUNITY): Payer: Self-pay

## 2021-02-13 ENCOUNTER — Other Ambulatory Visit: Payer: Medicaid Other

## 2021-02-13 LAB — CBC WITH DIFFERENTIAL/PLATELET
Basophils Absolute: 0 10*3/uL (ref 0.0–0.2)
Basos: 0 %
EOS (ABSOLUTE): 0.2 10*3/uL (ref 0.0–0.4)
Eos: 3 %
Hematocrit: 36.8 % (ref 34.0–46.6)
Hemoglobin: 12.3 g/dL (ref 11.1–15.9)
Immature Grans (Abs): 0 10*3/uL (ref 0.0–0.1)
Immature Granulocytes: 0 %
Lymphocytes Absolute: 2.1 10*3/uL (ref 0.7–3.1)
Lymphs: 28 %
MCH: 29.4 pg (ref 26.6–33.0)
MCHC: 33.4 g/dL (ref 31.5–35.7)
MCV: 88 fL (ref 79–97)
Monocytes Absolute: 0.6 10*3/uL (ref 0.1–0.9)
Monocytes: 7 %
Neutrophils Absolute: 4.7 10*3/uL (ref 1.4–7.0)
Neutrophils: 62 %
Platelets: 432 10*3/uL (ref 150–450)
RBC: 4.18 x10E6/uL (ref 3.77–5.28)
RDW: 12.4 % (ref 11.7–15.4)
WBC: 7.5 10*3/uL (ref 3.4–10.8)

## 2021-02-13 LAB — IRON,TIBC AND FERRITIN PANEL
Ferritin: 116 ng/mL (ref 15–150)
Iron Saturation: 17 % (ref 15–55)
Iron: 60 ug/dL (ref 27–159)
Total Iron Binding Capacity: 347 ug/dL (ref 250–450)
UIBC: 287 ug/dL (ref 131–425)

## 2021-02-15 ENCOUNTER — Other Ambulatory Visit: Payer: Self-pay | Admitting: Internal Medicine

## 2021-02-17 ENCOUNTER — Ambulatory Visit: Payer: Medicaid Other | Admitting: Physical Therapy

## 2021-02-17 ENCOUNTER — Telehealth: Payer: Self-pay

## 2021-02-17 ENCOUNTER — Telehealth: Payer: Self-pay | Admitting: Physical Therapy

## 2021-02-17 NOTE — Telephone Encounter (Signed)
Called patient to let her know that her iron studies and hemoglobin are back to normal range and to stay away from taking NSAIDS. Patient understood and had no further questions.

## 2021-02-17 NOTE — Telephone Encounter (Signed)
Called patient for 3rd NS appt. Gave pt next appt and advised scheduling 1 appt at a time per our compliance policy.

## 2021-02-17 NOTE — Telephone Encounter (Signed)
-----   Message from Jonathon Bellows, MD sent at 02/17/2021 11:37 AM EST ----- Inform iron studies and hemoglobin are back to completely normal range.  I believe that her GI bleed was probably related to NSAID use.  Advised to strongly stay away from NSAIDs.  Dr. Vicente Males

## 2021-02-18 NOTE — Progress Notes (Deleted)
Cardiology Office Note    Date:  02/18/2021   ID:  Cassidy Mathis April 20, 1975, MRN [id number]  PCP:  [facility]  Cardiologist:  Nelva Bush, MD  Electrophysiologist:  None   Chief Complaint: Follow-up  History of Present Illness:   Cassidy Mathis is a 46 y.o. female with history of significant lower GI bleed with acute blood loss anemia requiring multiple units of PRBC in 11/2020, HTN, asthma, cervical cancer, gallstone pancreatitis, and GERD who presents for follow-up of echo and coronary CTA (pending).  She was evaluated in the ED in 10/2020 with intermittent chest pain that has been present for years.  In the ED, she was noted to be hypertensive and anxious.  Work-up was reassuring including normal high-sensitivity troponin x2 and a stable hemoglobin of 13.1 (see below).  She was discharged on Imdur with recommendation to follow-up with cardiology.  Upon establishing with Dr. Saunders Revel on 11/28/2020, she reported chest pain for at least a year that was becoming more frequent and severe.  She reported the only thing that seemed to help it was lying prone on the ground and putting most of her weight on her chest.  She did not tolerate Imdur secondary to headache.  She also noted this did not seem to help her chest pain.  She was noted to have a 2 out of 6 holosystolic murmur on exam.  EKG showed sinus rhythm without acute abnormality.  Echo and coronary CTA were recommended.  Both remain pending at this time.    Following her initial visit with our office, she was admitted to the hospital in 11/2020 with large volume hematochezia and acute blood loss anemia requiring 3 units of PRBC.  EGD, colonoscopy, and RBC tagged scan along with video capsule endoscopy failed to reveal etiology of the patient's bleeding.  Meckel scan showed no evidence of any medical diverticulosis.  CT anterior gram showed no evidence of bleeding.  She was evaluated by vascular surgery with  provocative angiogram of the SMA being performed and did not reveal any evidence of bleeding, though did go microembolization of branches of the SMA.  She required 1 unit of PRBC postprocedure.  It was noted she had been a long-term user of NSAIDs.  ***   Labs independently reviewed: 01/2021 - Hgb 12.3, PLT 432 12/2020 - magnesium 2.2 11/2020 - potassium 3.6, BUN 8, serum creatinine 0.79, albumin 3.6, AST/ALT normal 10/2017 - TC 160, TG 75, HDL 75, LDL 69 11/2016 - A1c 5.5, TSH normal  Past Medical History:  Diagnosis Date   Allergy    seasonal   Anemia    h/o   Asthma    Cancer (HCC)    cervical   Depression    GERD (gastroesophageal reflux disease)    RARE-NO MEDS   Headache    MIGRAINES   Hypertension    PT STATES SHE IS SUPPOSED TO BE TAKING LISINOPRIL-HCTZ BUT HAS BEEN OUT "FOR A WHILE" NEEDS TO GET ANOTHER PRESCRIPTION FROM HER PCP   Pancreatitis    Pilonidal cyst    TOOK ANTIBIOTIC THIS MONTH AND IT IS RESOLVED   Pneumonia 2018    Past Surgical History:  Procedure Laterality Date   ABDOMINAL HYSTERECTOMY     partial   CERVICAL CONIZATION W/BX N/A 03/23/2017   Procedure: CONIZATION CERVIX WITH BIOPSY;  Surgeon: Gillis Ends, MD;  Location: [facility] ORS;  Service: Gynecology;  Laterality: N/A;   CHOLECYSTECTOMY  10/21/2015   Procedure:  LAPAROSCOPIC CHOLECYSTECTOMY WITH INTRAOPERATIVE CHOLANGIOGRAM;  Surgeon: Jules Husbands, MD;  Location: [facility];  Service: General;;   CHOLECYSTECTOMY Bilateral    COLONOSCOPY WITH PROPOFOL N/A 12/06/2020   Procedure: COLONOSCOPY WITH PROPOFOL;  Surgeon: Lesly Rubenstein, MD;  Location: [facility] ENDOSCOPY;  Service: Endoscopy;  Laterality: N/A;   CYST EXCISION     tongue AND WRIST   EPIGASTRIC HERNIA REPAIR N/A 04/18/2015   Procedure: HERNIA REPAIR EPIGASTRIC ADULT;  Surgeon: Jules Husbands, MD;  Location: [facility];  Service: General;  Laterality: N/A;   ERCP N/A 12/21/2016   Procedure: ENDOSCOPIC RETROGRADE  CHOLANGIOPANCREATOGRAPHY (ERCP);  Surgeon: [name], MD;  Location: [facility] ENDOSCOPY;  Service: Endoscopy;  Laterality: N/A;   ESOPHAGOGASTRODUODENOSCOPY  12/06/2020   Procedure: ESOPHAGOGASTRODUODENOSCOPY (EGD);  Surgeon: Lesly Rubenstein, MD;  Location: [facility] ENDOSCOPY;  Service: Endoscopy;;   GIVENS CAPSULE STUDY N/A 12/08/2020   Procedure: GIVENS CAPSULE STUDY;  Surgeon: Lin Landsman, MD;  [facility] END[facility]COPY;  Service: Gastroenterology;  Laterality: N/A;   HERNIA REPAIR     LAPAROSCOPIC SALPINGO OOPHERECTOMY Right 01/01/2020   Procedure: LAPAROSCOPIC SALPINGO OOPHORECTOMY;  Surgeon: Schermerhorn, Gwen Her, MD;  Location: [facility];  Service: Gynecology;  Laterality: Right;   LYSIS OF ADHESION N/A 01/01/2020   Procedure: LYSIS OF ADHESION;  Surgeon: Schermerhorn, Gwen Her, MD;  Location: [facility];  Service: Gynecology;  Laterality: N/A;   RADICAL HYSTERECTOMY  04/14/2017   SALPINGECTOMY Bilateral 04/14/2017   SENTINEL LYMPH NODE BIOPSY     TUBAL LIGATION     VISCERAL ANGIOGRAPHY N/A 12/11/2020   Procedure: VISCERAL ANGIOGRAPHY;  Surgeon: Algernon Huxley, MD;  Location: [facility];  Service: Cardiovascular;  Laterality: N/A;    Current Medications: No outpatient medications have been marked as taking for the 02/20/21 encounter (Appointment) with Rise Mu, PA-C.    Allergies:   Patient has no known allergies.   Social History   Socioeconomic History   Marital status: Single    Spouse name: Not on file   Number of children: 3   Years of education: Not on file   Highest education level: Not on file  Occupational History   Not on file  Tobacco Use   Smoking status: Some Days    Packs/day: 0.50    Years: 12.00    Pack years: 6.00    Types: Cigarettes   Smokeless tobacco: Never   Tobacco comments:    11/28/2020 last smoked cigarettes 10 days ago  Vaping Use   Vaping Use: Never used  Substance and Sexual Activity   Alcohol use: Yes    Comment:  occassionally; less than 1/month   Drug use: Yes    Frequency: 1.0 times per week    Types: Marijuana   Sexual activity: Yes    Partners: Male  Other Topics Concern   Not on file  Social History Narrative   Not on file   Social Determinants of Health   Financial Resource Strain: Not on file  Food Insecurity: Not on file  Transportation Needs: Not on file  Physical Activity: Not on file  Stress: Not on file  Social Connections: Not on file     Family History:  The patient's family history includes Cancer in her maternal grandfather, maternal grandmother, paternal grandfather, paternal grandmother, and paternal uncle; Gallstones in her mother; Gout in her father; Hypertension in her father and mother.  ROS:   ROS   EKGs/Labs/Other Studies Reviewed:    Studies reviewed were summarized above.  The additional studies were reviewed today:  2D echo: Pending __________  Coronary CTA: Pending  EKG:  EKG is ordered today.  The EKG ordered today demonstrates ***  Recent Labs: 12/04/2020: ALT 19 12/12/2020: BUN 7; Creatinine, Ser 0.84; Potassium 3.6; Sodium 138 12/29/2020: Magnesium 2.2 02/12/2021: Hemoglobin 12.3; Platelets 432  Recent Lipid Panel    Component Value Date/Time   CHOL 160 11/16/2017 0956   TRIG 75 11/16/2017 0956   HDL 75 11/16/2017 0956   CHOLHDL 2.1 11/16/2017 0956   VLDL 6 12/16/2016 0513   LDLCALC 69 11/16/2017 0956    PHYSICAL EXAM:    VS:  LMP 03/18/2017 (Exact Date)   BMI: There is no height or weight on file to calculate BMI.  Physical Exam  Wt Readings from Last 3 Encounters:  02/12/21 230 lb 12.8 oz (104.7 kg)  12/29/20 220 lb (99.8 kg)  12/11/20 223 lb (101.2 kg)     ASSESSMENT & PLAN:   Precardial pain:  Heart murmur:  HTN: Blood pressure ***  Lower GI bleed with acute blood loss anemia: ***.  Most recent hemoglobin from 02/12/2021 improved to 12.3.  Extensive work-up unrevealing as outlined above.  GI feels her bleeding may  have been in the context of long-term NSAID use.  Follow-up with PCP and GI as directed.   {Are you ordering a CV Procedure (e.g. stress test, cath, DCCV, TEE, etc)?   Press F2        :[id number]}     Disposition: F/u with Dr. Saunders Revel or an APP in ***.   Medication Adjustments/Labs and Tests Ordered: Current medicines are reviewed at length with the patient today.  Concerns regarding medicines are outlined above. Medication changes, Labs and Tests ordered today are summarized above and listed in the Patient Instructions accessible in Encounters.   Signed, Christell Faith, PA-C 02/18/2021 10:08 AM     [facility] [address] [place] [address] [place], [place] [address] [phone]

## 2021-02-19 ENCOUNTER — Encounter: Payer: Self-pay | Admitting: Physical Therapy

## 2021-02-19 ENCOUNTER — Ambulatory Visit: Payer: Medicaid Other | Admitting: Physical Therapy

## 2021-02-19 ENCOUNTER — Other Ambulatory Visit: Payer: Self-pay

## 2021-02-19 DIAGNOSIS — M6281 Muscle weakness (generalized): Secondary | ICD-10-CM

## 2021-02-19 DIAGNOSIS — M5442 Lumbago with sciatica, left side: Secondary | ICD-10-CM | POA: Diagnosis not present

## 2021-02-19 DIAGNOSIS — G8929 Other chronic pain: Secondary | ICD-10-CM

## 2021-02-19 NOTE — Therapy (Signed)
[facility] [address], [place], [address] Phone: [phone]   Fax:  [phone]  Physical Therapy Treatment  Patient Details  Name: Cassidy Mathis MRN: [id number] Date of Birth: May 07, 1975 Referring Provider (PT): Milinda Pointer   Encounter Date: 02/19/2021   PT End of Session - 02/19/21 1527     Visit Number 3    Number of Visits 17    Date for PT Re-Evaluation 03/12/21    Authorization Type [employer]    Authorization Time Period 02/02/21 - 03/13/21    Authorization - Visit Number 1    Authorization - Number of Visits 16    Progress Note Due on Visit 10    PT Start Time 1950    PT Stop Time 1555    PT Time Calculation (min) 40 min    Activity Tolerance Patient limited by pain    Behavior During Therapy WFL for tasks assessed/performed             Past Medical History:  Diagnosis Date   Allergy    seasonal   Anemia    h/o   Asthma    Cancer ([place])    cervical   Depression    GERD (gastroesophageal reflux disease)    RARE-NO MEDS   Headache    MIGRAINES   Hypertension    PT STATES SHE IS SUPPOSED TO BE TAKING LISINOPRIL-HCTZ BUT HAS BEEN OUT "FOR A WHILE" NEEDS TO GET ANOTHER PRESCRIPTION FROM HER PCP   Pancreatitis    Pilonidal cyst    TOOK ANTIBIOTIC THIS MONTH AND IT IS RESOLVED   Pneumonia 2018    Past Surgical History:  Procedure Laterality Date   ABDOMINAL HYSTERECTOMY     partial   CERVICAL CONIZATION W/BX N/A 03/23/2017   Procedure: CONIZATION CERVIX WITH BIOPSY;  Surgeon: Gillis Ends, MD;  Location: [facility];  Service: Gynecology;  Laterality: N/A;   CHOLECYSTECTOMY  10/21/2015   Procedure: LAPAROSCOPIC CHOLECYSTECTOMY WITH INTRAOPERATIVE CHOLANGIOGRAM;  Surgeon: Jules Husbands, MD;  Location: [facility];  Service: General;;   CHOLECYSTECTOMY Bilateral    COLONOSCOPY WITH PROPOFOL N/A 12/06/2020   Procedure: COLONOSCOPY WITH PROPOFOL;  Surgeon:  Lesly Rubenstein, MD;  Location: [facility] ENDOSCOPY;  Service: Endoscopy;  Laterality: N/A;   CYST EXCISION     tongue AND WRIST   EPIGASTRIC HERNIA REPAIR N/A 04/18/2015   Procedure: HERNIA REPAIR EPIGASTRIC ADULT;  Surgeon: Jules Husbands, MD;  Location: [facility];  Service: General;  Laterality: N/A;   ERCP N/A 12/21/2016   Procedure: ENDOSCOPIC RETROGRADE CHOLANGIOPANCREATOGRAPHY (ERCP);  Surgeon: [name], MD;  Location: [facility] ENDOSCOPY;  Service: Endoscopy;  Laterality: N/A;   ESOPHAGOGASTRODUODENOSCOPY  12/06/2020   Procedure: ESOPHAGOGASTRODUODENOSCOPY (EGD);  Surgeon: Lesly Rubenstein, MD;  Location: [facility] ENDOSCOPY;  Service: Endoscopy;;   GIVENS CAPSULE STUDY N/A 12/08/2020   Procedure: GIVENS CAPSULE STUDY;  Surgeon: Lin Landsman, MD;  Location: [facility] ENDOSCOPY;  Service: Gastroenterology;  Laterality: N/A;   HERNIA REPAIR     LAPAROSCOPIC SALPINGO OOPHERECTOMY Right 01/01/2020   Procedure: LAPAROSCOPIC SALPINGO OOPHORECTOMY;  Surgeon: Schermerhorn, Gwen Her, MD;  Location: [facility];  Service: Gynecology;  Laterality: Right;   LYSIS OF ADHESION N/A 01/01/2020   Procedure: LYSIS OF ADHESION;  Surgeon: Schermerhorn, Gwen Her, MD;  Location: [facility];  Service: Gynecology;  Laterality: N/A;   RADICAL HYSTERECTOMY  04/14/2017   SALPINGECTOMY Bilateral 04/14/2017   SENTINEL LYMPH NODE BIOPSY  TUBAL LIGATION     VISCERAL ANGIOGRAPHY N/A 12/11/2020   Procedure: VISCERAL ANGIOGRAPHY;  Surgeon: Algernon Huxley, MD;  Location: [facility];  Service: Cardiovascular;  Laterality: N/A;    There were no vitals filed for this visit.   Subjective Assessment - 02/19/21 1520     Subjective Pt reports she is completing her repeated ext about 3x/day x12 reports is unable to do them at home. Reports she has been doing her other exercises as well. Has 8/10 pain today radiating down RLE into the calf.    Pertinent History Pt is a 46 y.o. female referred to OP PT for chronic LBP  without radiculopathy. PMH includes: Incarcerated epigastric hernia; Calculus of gallbladder with chronic cholecystitis without obstruction; Pancreatitis; Common bile duct stone; Malignant neoplasm of exocervix ([place]); Cervical cancer, FIGO stage IB1 ([place]); Ileus ([place]); Tobacco use; Postoperative anemia due to acute blood loss; Right ovarian cyst; Precordial pain; Palpitations; Heart murmur; Uncontrolled hypertension; Rectal bleeding; Vitamin B12 deficiency; Acute blood loss anemia; Hypokalemia; Acute bilateral low back pain with bilateral sciatica; Foraminal stenosis of lumbar region; Malignant neoplasm of overlapping sites of cervix ([place]); Chronic pain syndrome. 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 Endorses pain across LBP radiating from buttocks to B LE's to ankles posteriorly. Endorses L >R, symptoms worsened with sitting, walking, laying (side sleeper and tummy sleeper), standing/walking. Endorses sleeping in supine in order to prop LE's up which does ease her symptoms. Pt is a Chartered certified accountant at [employer] and is limited in her job duties due to the bending, standing, walking required. Reports pain as burning, crampy, sharp in lower back and LE's. Pain in LE described as achey as well. Rpeorts having to constantly move LE's and rotate at torso for symptom management. Lowest recorded at 5/10 NPS, > 10/10 at worst, currently reporting 7.5/10 NPS. Injections and pain medicaitons have not been helpful. Rpeorts insurance auth requiring 4 PT visits before approving further injections, however pt is hopeful PT will improve symptoms and not coming to PT just for getting approved for furthre injections as in the past they have not been helpful. Endorses episode of low back spasm on Sunday that lasted ~1.5 hours where she also had chest tightness. Denies jaw pain, radiating arm pain, true chest  pain, describes chest as sore or charlie horse. Endorses nothing has been improving her pain except resting LE's on pillows. Pt denies any significant weight loss, B/B changes, saddle anesthesia. Has had blood in stools in past but was d/c'd from hosputal ~2 weeks ago for GI bleed. Goal is to improve pain to improve ADL's and QoL.    Limitations Sitting;House hold activities;Lifting;Standing;Walking    How long can you sit comfortably? 30 minutes max    How long can you stand comfortably? 10 minutes    How long can you walk comfortably? 5 minutes    Diagnostic tests X-rays    Patient Stated Goals Improve pain    Pain Onset More than a month ago              Ther-Ex BP at beginning of session: 147/97 after 87mins rest during subj 152/92 (manually) Nustep L2 for gentle lumbar mobility and strengthening  BP following 168/98 Repeated prone ext to elbows x12; with mob at L4/5 x12 Supine lower trunk rotation x15 with 2sec hold, min cuing for lumbopelvic dissociation with good  carry over  [place] 3x bilat [place] forward rolls in sitting x12 with cuing for breath control  BP following 162/92  Visual and verbal review of HEP (above therex, visual review of sciatic nerve glides)  PT educated patient on need for consistency with repeated motions for them to decrease pain and to utilize them every hour to prevent pain, as opposed to "chasing" pain with understanding. Education on need for BP management to allow for PT to progress hip and core strengthening exercises, as current BP is too close to critical high for progression. PT educated patient on symptoms of critical high, to contact emergencies services in the event of these, to prioritize follow up with PCP on BP, and self management strategies (diet and increased water intake) with understanding.                             PT Education - 02/19/21 1527     Education Details HEP review/education, BP  education    Person(s) Educated Patient    Methods Explanation;Demonstration;Verbal cues    Comprehension Verbalized understanding;Returned demonstration;Verbal cues required              PT Short Term Goals - 01/15/21 1247       PT SHORT TERM GOAL #1   Title Pt will be independent with HEP to improve pain, mobility and strength for ADL completion.    Baseline 12/22: initiated    Time 4    Period Weeks    Status New    Target Date 02/12/21               PT Long Term Goals - 02/10/21 1525       PT LONG TERM GOAL #1   Title Pt will improve FOTO to target score of 48 to display improvements in functional mobility    Baseline 12/22: 34 with target of 48; 02/10/21 30    Time 8    Period Weeks    Status On-going      PT LONG TERM GOAL #2   Title Pt will display ability to bend over and pick item off floor with < 5/10 pain NPS to demo improved ability to perform job tasks.    Baseline 12/22: limited in lumbar flexion > 75%. UNable to pick items up from floor at work; 02/10/21 full flex with 9/10 pain    Time 8    Period Weeks    Status On-going      PT LONG TERM GOAL #3   Title Pt will complete 5xSTS in < 12 sec to demonstrate clinically significant improvement in LE strength for standing and walking ADL completion    Baseline 12/22: Deferred due to elevated BP. perform next session; 02/10/21 17sec    Time 8    Period Weeks    Status On-going      PT LONG TERM GOAL #4   Title Pt will display gross 4+/5 MMT in BLE's with no pain to demo improvement in Low back symptoms and LE strength.    Baseline 12/22: Limited on L from 3+ to 4-/5 and 4/5 on RLE; 02/10/21    Time 8    Period Weeks    Status On-going                   Plan - 02/19/21 1613     Clinical Impression Statement PT lead patient in therex for increased lumbar mobility  and pain reduction. paitent reports pain improvement with lumbar ext with overpressure, but subjectively no decrease in overall  pain at end of session. BP monitored throughout session where patient has near contraindicated diastolic BP throughout. PT educated patient on importance of BP management for overall health and for rehab potential, as currently sessions are unable to progress strengthening d/t to BP management (see tx note). PT reviewed HEP with patient, and educated on importance of compliance with repeated motions every hour for them to work- and the concept of pain prevention vs. pain "chasing" with understanding. PT will continue progression as able.    Personal Factors and Comorbidities Age;Comorbidity 3+;Education;Past/Current Experience;Profession;Time since onset of injury/illness/exacerbation    Comorbidities see subjective    Examination-Activity Limitations Squat;Bed Mobility;Bend;Lift;Locomotion Level;Stand;Reach Overhead;Carry;Sleep;Sit    Examination-Participation Restrictions Laundry;Cleaning;Shop;Community Activity;Occupation    Stability/Clinical Decision Making Evolving/Moderate complexity    Clinical Decision Making Moderate    Rehab Potential Fair    PT Frequency 2x / week    PT Duration 8 weeks    PT Treatment/Interventions ADLs/Self Care Home Management;Aquatic Therapy;Canalith Repostioning;Cryotherapy;Electrical Stimulation;Moist Heat;Traction;Gait training;Stair training;Functional mobility training;Therapeutic activities;Patient/family education;Therapeutic exercise;Balance training;Neuromuscular re-education;Manual techniques;Dry needling;Passive range of motion;Spinal Manipulations;Vestibular;Joint Manipulations    PT Next Visit Plan TAKE BP    PT Home Exercise Plan Access Code: [email or web address]    Consulted and Agree with Plan of Care Patient             Patient will benefit from skilled therapeutic intervention in order to improve the following deficits and impairments:  Abnormal gait, Decreased mobility, Cardiopulmonary status limiting activity, Increased muscle spasms, Postural  dysfunction, Pain, Improper body mechanics, Decreased range of motion, Decreased strength, Hypomobility, Impaired flexibility  Visit Diagnosis: Chronic bilateral low back pain with left-sided sciatica  Muscle weakness (generalized)     Problem List Patient Active Problem List   Diagnosis Date Noted   Chronic pain syndrome 12/29/2020   Pharmacologic therapy 12/29/2020   Disorder of skeletal system 12/29/2020   Problems influencing health status 12/29/2020   History of marijuana use 12/29/2020   Abnormal MRI, cervical spine (05/06/2019) 12/29/2020   Abnormal MRI, thoracic spine (05/06/2019) 12/29/2020   Abnormal MRI, lumbar spine (05/06/2019) 12/29/2020   GERD (gastroesophageal reflux disease) 12/29/2020   NSAID induced gastritis 12/29/2020   Chronic low back pain (1ry area of Pain) (Bilateral) (L>R) w/o sciatica 12/29/2020   Chronic lower extremity pain (2ry area of Pain) (Bilateral) (L>R) 12/29/2020   Chronic sacroiliac joint pain (Bilateral) 12/29/2020   Lumbosacral facet syndrome (Bilateral) 12/29/2020   Lumbar facet hypertrophy (L4-5) (Bilateral) 12/29/2020   Lumbosacral facet arthropathy 12/29/2020   Somatic dysfunction of sacroiliac joints (Bilateral) 12/29/2020   Hypokalemia 12/11/2020   Acute blood loss anemia 12/07/2020   Vitamin B12 deficiency 12/06/2020   Rectal bleeding 12/04/2020   Precordial pain 11/28/2020   Palpitations 11/28/2020   Heart murmur 11/28/2020   Uncontrolled hypertension 11/28/2020   Right ovarian cyst    Acute bilateral low back pain with bilateral sciatica 06/01/2019   Foraminal stenosis of lumbar region 06/01/2019   Cervical cancer, FIGO stage IB1 ([place]) 05/17/2017   Ileus ([place]) 04/18/2017   Postoperative anemia due to acute blood loss 04/18/2017   Tobacco use 04/06/2017   Malignant neoplasm of overlapping sites of cervix ([place]) 04/06/2017   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 [place], [address] Phone: [phone]   Fax:  [phone]  Name: MELANNY WIRE MRN: [id number] Date of Birth: 1975-09-28

## 2021-02-20 ENCOUNTER — Ambulatory Visit: Payer: Medicaid Other | Admitting: Physician Assistant

## 2021-02-20 DIAGNOSIS — R011 Cardiac murmur, unspecified: Secondary | ICD-10-CM

## 2021-02-20 DIAGNOSIS — D62 Acute posthemorrhagic anemia: Secondary | ICD-10-CM

## 2021-02-20 DIAGNOSIS — R072 Precordial pain: Secondary | ICD-10-CM

## 2021-02-20 DIAGNOSIS — I10 Essential (primary) hypertension: Secondary | ICD-10-CM

## 2021-02-20 DIAGNOSIS — K922 Gastrointestinal hemorrhage, unspecified: Secondary | ICD-10-CM

## 2021-02-24 ENCOUNTER — Other Ambulatory Visit: Payer: Self-pay

## 2021-02-24 ENCOUNTER — Encounter: Payer: Self-pay | Admitting: Physical Therapy

## 2021-02-24 ENCOUNTER — Ambulatory Visit: Payer: Medicaid Other | Admitting: Physical Therapy

## 2021-02-24 DIAGNOSIS — G8929 Other chronic pain: Secondary | ICD-10-CM

## 2021-02-24 DIAGNOSIS — M6281 Muscle weakness (generalized): Secondary | ICD-10-CM

## 2021-02-24 DIAGNOSIS — M5442 Lumbago with sciatica, left side: Secondary | ICD-10-CM | POA: Diagnosis not present

## 2021-02-24 NOTE — Therapy (Signed)
[facility] [address], [place], [address] Phone: [phone]   Fax:  [phone]  Physical Therapy Treatment  Patient Details  Name: Cassidy Mathis MRN: [id number] Date of Birth: 1975/02/17 Referring Provider (PT): Milinda Pointer   Encounter Date: 02/24/2021   PT End of Session - 02/24/21 1550     Visit Number 4    Number of Visits 17    Date for PT Re-Evaluation 03/12/21    Authorization Type [employer]    Authorization Time Period 02/02/21 - 03/13/21    Authorization - Visit Number 4    Authorization - Number of Visits 16    Progress Note Due on Visit 10    PT Start Time 1518    PT Stop Time 1556    PT Time Calculation (min) 38 min    Activity Tolerance Patient limited by pain    Behavior During Therapy WFL for tasks assessed/performed             Past Medical History:  Diagnosis Date   Allergy    seasonal   Anemia    h/o   Asthma    Cancer ([place])    cervical   Depression    GERD (gastroesophageal reflux disease)    RARE-NO MEDS   Headache    MIGRAINES   Hypertension    PT STATES SHE IS SUPPOSED TO BE TAKING LISINOPRIL-HCTZ BUT HAS BEEN OUT "FOR A WHILE" NEEDS TO GET ANOTHER PRESCRIPTION FROM HER PCP   Pancreatitis    Pilonidal cyst    TOOK ANTIBIOTIC THIS MONTH AND IT IS RESOLVED   Pneumonia 2018    Past Surgical History:  Procedure Laterality Date   ABDOMINAL HYSTERECTOMY     partial   CERVICAL CONIZATION W/BX N/A 03/23/2017   Procedure: CONIZATION CERVIX WITH BIOPSY;  Surgeon: Gillis Ends, MD;  Location: [facility] ORS;  Service: Gynecology;  Laterality: N/A;   CHOLECYSTECTOMY  10/21/2015   Procedure: LAPAROSCOPIC CHOLECYSTECTOMY WITH INTRAOPERATIVE CHOLANGIOGRAM;  Surgeon: Jules Husbands, MD;  Location: [facility];  Service: General;;   CHOLECYSTECTOMY Bilateral    COLONOSCOPY WITH PROPOFOL N/A 12/06/2020   Procedure: COLONOSCOPY WITH PROPOFOL;  Surgeon:  Lesly Rubenstein, MD;  Location: [facility] ENDOSCOPY;  Service: Endoscopy;  Laterality: N/A;   CYST EXCISION     tongue AND WRIST   EPIGASTRIC HERNIA REPAIR N/A 04/18/2015   Procedure: HERNIA REPAIR EPIGASTRIC ADULT;  Surgeon: Jules Husbands, MD;  Location: [facility];  Service: General;  Laterality: N/A;   ERCP N/A 12/21/2016   Procedure: ENDOSCOPIC RETROGRADE CHOLANGIOPANCREATOGRAPHY (ERCP);  Surgeon: [name], MD;  Location: [facility] ENDOSCOPY;  Service: Endoscopy;  Laterality: N/A;   ESOPHAGOGASTRODUODENOSCOPY  12/06/2020   Procedure: ESOPHAGOGASTRODUODENOSCOPY (EGD);  Surgeon: Lesly Rubenstein, MD;  Location: [facility] ENDOSCOPY;  Service: Endoscopy;;   GIVENS CAPSULE STUDY N/A 12/08/2020   Procedure: GIVENS CAPSULE STUDY;  Surgeon: Lin Landsman, MD;  Location: [facility] ENDOSCOPY;  Service: Gastroenterology;  Laterality: N/A;   HERNIA REPAIR     LAPAROSCOPIC SALPINGO OOPHERECTOMY Right 01/01/2020   Procedure: LAPAROSCOPIC SALPINGO OOPHORECTOMY;  Surgeon: Schermerhorn, Gwen Her, MD;  Location: [facility] ORS;  Service: Gynecology;  Laterality: Right;   LYSIS OF ADHESION N/A 01/01/2020   Procedure: LYSIS OF ADHESION;  Surgeon: Schermerhorn, Gwen Her, MD;  Location: [facility] OR[facility];  Service: Gynecology;  Laterality: N/A;   RADICAL HYSTERECTOMY  04/14/2017   SALPINGECTOMY Bilateral 04/14/2017   SENTINEL LYMPH NODE BIOPSY  TUBAL LIGATION     VISCERAL ANGIOGRAPHY N/A 12/11/2020   Procedure: VISCERAL ANGIOGRAPHY;  Surgeon: Algernon Huxley, MD;  Location: [facility];  Service: Cardiovascular;  Laterality: N/A;    There were no vitals filed for this visit.   Subjective Assessment - 02/24/21 1525     Subjective Patient reports her back started hurting more last night before going to bed and has been higher ever since. NO adverse event, insideous onset with n/t into L foot. Pt reports temporary relief with repeated ext, but has not tried other exercises. Reports she tried a heat blanket that did not  help. Current pain 13/10. Reports she cannot get a in touch with her PCP to discuss her BP    Pertinent History Pt is a 46 y.o. female referred to OP PT for chronic LBP without radiculopathy. 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 Endorses pain across LBP radiating from buttocks to B LE's to ankles posteriorly. Endorses L >R, symptoms worsened with sitting, walking, laying (side sleeper and tummy sleeper), standing/walking. Endorses sleeping in supine in order to prop LE's up which does ease her symptoms. Pt is a Chartered certified accountant at [employer] and is limited in her job duties due to the bending, standing, walking required. Reports pain as burning, crampy, sharp in lower back and LE's. Pain in LE described as achey as well. Rpeorts having to constantly move LE's and rotate at torso for symptom management. Lowest recorded at 5/10 NPS, > 10/10 at worst, currently reporting 7.5/10 NPS. Injections and pain medicaitons have not been helpful. Rpeorts insurance auth requiring 4 PT visits before approving further injections, however pt is hopeful PT will improve symptoms and not coming to PT just for getting approved for furthre injections as in the past they have not  been helpful. Endorses episode of low back spasm on Sunday that lasted ~1.5 hours where she also had chest tightness. Denies jaw pain, radiating arm pain, true chest pain, describes chest as sore or charlie horse. Endorses nothing has been improving her pain except resting LE's on pillows. Pt denies any significant weight loss, B/B changes, saddle anesthesia. Has had blood in stools in past but was d/c'd from hosputal ~2 weeks ago for GI bleed. Goal is to improve pain to improve ADL's and QoL.    Limitations Sitting;House hold activities;Lifting;Standing;Walking    How long can you sit comfortably? 30 minutes max    How long can you stand comfortably? 10 minutes    How long can you walk comfortably? 5 minutes    Diagnostic tests X-rays    Patient Stated Goals Improve pain    Pain Onset More than a month ago               BP at beginning of session: 144/88 after 67mins rest during subj 140/88 (manually)    ESTIM + heat pack HiVolt  ESTIM 12 min at patient tolerated 140V increased to 145V through treatment at bilat lumbar paraspinal and superior glute area . Attempted to decrease pain. With PT assessing patient tolerance throughout (increasing intensity as needed), monitoring skin integrity (normal), with decreased pain noted from patient  Following BP 142/87 Pain education provided during ESTIM   Ther-Ex Seated sciatic glide x20  Repeated prone ext to elbows x12; with mob at L4/5 x12 Prone prop 27mins Supine lower trunk rotation x15 with 2sec hold, min cuing for lumbopelvic dissociation with good carry over  BP following 143/85                                       PT Education - 02/24/21 1549     Education Details ESTIM, therex form/technique, BP    Person(s) Educated Patient    Methods Explanation;Demonstration;Verbal cues    Comprehension Verbalized understanding;Returned demonstration;Verbal cues required              PT Short Term Goals -  01/15/21 1247       PT SHORT TERM GOAL #1   Title Pt will be independent with HEP to improve pain, mobility and strength for ADL completion.    Baseline 12/22: initiated    Time 4    Period Weeks    Status New    Target Date 02/12/21               PT Long Term Goals - 02/10/21 1525       PT LONG TERM GOAL #1   Title Pt will improve FOTO to target score of 48 to display improvements in functional mobility    Baseline 12/22: 34 with target of 48; 02/10/21 30    Time 8    Period Weeks    Status On-going      PT LONG TERM GOAL #2   Title Pt will display ability to bend over and pick item off floor with < 5/10 pain NPS to demo improved ability to perform job tasks.    Baseline 12/22: limited in lumbar flexion > 75%. UNable to pick items up from floor at work; 02/10/21 full flex with 9/10 pain    Time 8    Period Weeks    Status On-going      PT LONG TERM GOAL #3   Title Pt will complete 5xSTS in < 12 sec to demonstrate clinically significant improvement in LE strength for standing and walking ADL completion    Baseline 12/22: Deferred due to elevated BP. perform next session; 02/10/21 17sec    Time 8    Period Weeks    Status On-going      PT LONG TERM GOAL #4   Title Pt will display gross 4+/5 MMT in BLE's with no pain to demo improvement in Low back symptoms and LE strength.    Baseline 12/22: Limited on L from 3+ to 4-/5 and 4/5 on RLE; 02/10/21    Time 8    Period Weeks    Status On-going                   Plan - 02/24/21 1632     Clinical Impression Statement session continues to be limited in therex progression needed for increased lumbar/core strength and stability d/t uncontrolled BP. PT has sent note to MD about this, as well as encouraged patient to reach out. BP monitored  throughout session, with near highs to cease all therapy, only supine/prone analgesic therapy conducted to date due to this. PT utilized ESTIM + heat, pain science education and low  level therex for pain reduction. Following session patient reports 10/10 pain, previously 13/10, with reduced sensation of n/t but still to L foot. PT advised patient in BP management, sleep hygiene for pain modulation, and education on breaking "pain cycle" with movement to tolerance with understanding. PT will continue progression as able.    Personal Factors and Comorbidities Age;Comorbidity 3+;Education;Past/Current Experience;Profession;Time since onset of injury/illness/exacerbation    Comorbidities see subjective    Examination-Activity Limitations Squat;Bed Mobility;Bend;Lift;Locomotion Level;Stand;Reach Overhead;Carry;Sleep;Sit    Examination-Participation Restrictions Laundry;Cleaning;Shop;Community Activity;Occupation    Stability/Clinical Decision Making Evolving/Moderate complexity    Clinical Decision Making Moderate    Rehab Potential Fair    PT Frequency 2x / week    PT Duration 8 weeks    PT Treatment/Interventions ADLs/Self Care Home Management;Aquatic Therapy;Canalith Repostioning;Cryotherapy;Electrical Stimulation;Moist Heat;Traction;Gait training;Stair training;Functional mobility training;Therapeutic activities;Patient/family education;Therapeutic exercise;Balance training;Neuromuscular re-education;Manual techniques;Dry needling;Passive range of motion;Spinal Manipulations;Vestibular;Joint Manipulations    PT Next Visit Plan TAKE BP    PT Home Exercise Plan Access Code: [email or web address]    Consulted and Agree with Plan of Care Patient             Patient will benefit from skilled therapeutic intervention in order to improve the following deficits and impairments:  Abnormal gait, Decreased mobility, Cardiopulmonary status limiting activity, Increased muscle spasms, Postural dysfunction, Pain, Improper body mechanics, Decreased range of motion, Decreased strength, Hypomobility, Impaired flexibility  Visit Diagnosis: Chronic bilateral low back pain with left-sided  sciatica  Muscle weakness (generalized)     Problem List Patient Active Problem List   Diagnosis Date Noted   Chronic pain syndrome 12/29/2020   Pharmacologic therapy 12/29/2020   Disorder of skeletal system 12/29/2020   Problems influencing health status 12/29/2020   History of marijuana use 12/29/2020   Abnormal MRI, cervical spine (05/06/2019) 12/29/2020   Abnormal MRI, thoracic spine (05/06/2019) 12/29/2020   Abnormal MRI, lumbar spine (05/06/2019) 12/29/2020   GERD (gastroesophageal reflux disease) 12/29/2020   NSAID induced gastritis 12/29/2020   Chronic low back pain (1ry area of Pain) (Bilateral) (L>R) w/o sciatica 12/29/2020   Chronic lower extremity pain (2ry area of Pain) (Bilateral) (L>R) 12/29/2020   Chronic sacroiliac joint pain (Bilateral) 12/29/2020   Lumbosacral facet syndrome (Bilateral) 12/29/2020   Lumbar facet hypertrophy (L4-5) (Bilateral) 12/29/2020   Lumbosacral facet arthropathy 12/29/2020   Somatic dysfunction of sacroiliac joints (Bilateral) 12/29/2020   Hypokalemia 12/11/2020   Acute blood loss anemia 12/07/2020   Vitamin B12 deficiency 12/06/2020   Rectal bleeding 12/04/2020   Precordial pain 11/28/2020   Palpitations 11/28/2020   Heart murmur 11/28/2020   Uncontrolled hypertension 11/28/2020   Right ovarian cyst    Acute bilateral low back pain with bilateral sciatica 06/01/2019   Foraminal stenosis of lumbar region 06/01/2019   Cervical cancer, FIGO stage IB1 ([place]) 05/17/2017   Ileus ([place]) 04/18/2017   Postoperative anemia due to acute blood loss 04/18/2017   Tobacco use 04/06/2017   Malignant neoplasm of overlapping sites of cervix ([place]) 04/06/2017   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

## 2021-02-26 ENCOUNTER — Ambulatory Visit: Payer: Medicaid Other | Admitting: Physical Therapy

## 2021-03-03 ENCOUNTER — Ambulatory Visit: Payer: Medicaid Other | Attending: Pain Medicine | Admitting: Physical Therapy

## 2021-03-30 ENCOUNTER — Emergency Department
Admission: EM | Admit: 2021-03-30 | Discharge: 2021-03-30 | Disposition: A | Discharge status: Home / Self Care | Payer: Medicaid Other | Attending: Emergency Medicine | Admitting: Emergency Medicine

## 2021-03-30 ENCOUNTER — Other Ambulatory Visit: Payer: Self-pay

## 2021-03-30 DIAGNOSIS — R220 Localized swelling, mass and lump, head: Secondary | ICD-10-CM | POA: Diagnosis present

## 2021-03-30 DIAGNOSIS — I10 Essential (primary) hypertension: Secondary | ICD-10-CM | POA: Insufficient documentation

## 2021-03-30 DIAGNOSIS — Z8541 Personal history of malignant neoplasm of cervix uteri: Secondary | ICD-10-CM | POA: Diagnosis not present

## 2021-03-30 DIAGNOSIS — T783XXA Angioneurotic edema, initial encounter: Secondary | ICD-10-CM | POA: Diagnosis not present

## 2021-03-30 DIAGNOSIS — J45909 Unspecified asthma, uncomplicated: Secondary | ICD-10-CM | POA: Diagnosis not present

## 2021-03-30 DIAGNOSIS — T464X5A Adverse effect of angiotensin-converting-enzyme inhibitors, initial encounter: Secondary | ICD-10-CM | POA: Insufficient documentation

## 2021-03-30 MED ORDER — FAMOTIDINE IN NACL 20-0.9 MG/50ML-% IV SOLN
20.0000 mg | Freq: Once | INTRAVENOUS | Status: AC
  Administered 2021-03-30: 20 mg via INTRAVENOUS
  Filled 2021-03-30: qty 50

## 2021-03-30 MED ORDER — METHYLPREDNISOLONE SODIUM SUCC 125 MG IJ SOLR
125.0000 mg | Freq: Once | INTRAMUSCULAR | Status: AC
  Administered 2021-03-30: 125 mg via INTRAVENOUS
  Filled 2021-03-30: qty 2

## 2021-03-30 MED ORDER — DIPHENHYDRAMINE HCL 50 MG/ML IJ SOLN
25.0000 mg | Freq: Once | INTRAMUSCULAR | Status: AC
  Administered 2021-03-30: 25 mg via INTRAVENOUS
  Filled 2021-03-30: qty 1

## 2021-03-30 MED ORDER — SODIUM CHLORIDE 0.9 % IV SOLN: 40.0000 mg | Freq: Once | INTRAVENOUS | Status: DC

## 2021-03-30 MED ORDER — GABAPENTIN 300 MG PO CAPS
300.0000 mg | ORAL_CAPSULE | Freq: Once | ORAL | Status: AC
  Administered 2021-03-30: 300 mg via ORAL
  Filled 2021-03-30: qty 1

## 2021-03-30 NOTE — ED Notes (Signed)
Patient resting on stretcher in room. RR even and unlabored. Patient verbalizes no new complaints or needs at this time. Patient was provided her home Neurontin per her request.  ?

## 2021-03-30 NOTE — ED Notes (Signed)
Patient states she started taking the diet injections 7 days ago. Tonight she took it and sat down to watch TV. Shorty thereafter, she noticed that her upper lip began swelling and her throat started feeling tight. She now states it throat feels worse and her upper lip is more swollen than before. Patient requested two warm blankets and a remote for the TV before I left her room. ?

## 2021-03-30 NOTE — ED Notes (Signed)
Patient states that she doesn't feel any different since receiving medications thus far. Patient verbalizes no new complaints or needs at this time. RR even and unlabored. ?

## 2021-03-30 NOTE — ED Notes (Signed)
Md at bedside

## 2021-03-30 NOTE — ED Notes (Signed)
Primary RN now at bedside.  ?

## 2021-03-30 NOTE — ED Triage Notes (Signed)
Pt is on lisinopril, pt states approx 2 hours pta began to experience upper lip swelling that has worsened. Pt without tongue swelling noted, pt has no difficulty speaking. No resp distress noted.  ?

## 2021-03-30 NOTE — ED Notes (Signed)
Discharge instructions and follow-up info provided to patient. Patient verbalized understanding. Patient ambulatory with a steady gait out to the waiting room. ?

## 2021-03-30 NOTE — Discharge Instructions (Signed)
It is very important that you do not ever take lisinopril or other medications that are ACE inhibitors.  If you have any progression of the swelling of your lip including progression to the lower lip, tongue swelling difficulty swallowing, voice change or difficulty breathing, please call 911 and return to the emergency department immediately. ?

## 2021-03-30 NOTE — ED Provider Notes (Signed)
? ?[facility] ?Provider Note ? ? ? Event Date/Time  ? First MD Initiated Contact with Patient 03/30/21 0106   ?  (approximate) ? ? ?History  ? ?Angioedema ? ? ?HPI ? ?Cassidy Mathis is a 46 y.o. female  with pmh of HTN on lisinopril, migraine headache, cervical cancer, asthma who presents with upper lip swelling.  Symptoms started acutely about 1 hour prior to arrival.  She denies any sensation of tongue swelling difficulty swallowing voice changes or shortness of breath.  No rashes.  Denies any history of angioedema.  Has been on lisinopril for many years.  Denies other symptoms including chest pain nausea vomiting abdominal pain diarrhea. ? ?  ? ?Past Medical History:  ?Diagnosis Date  ? Allergy   ? seasonal  ? Anemia   ? h/o  ? Asthma   ? Cancer [facility])   ? cervical  ? Depression   ? GERD (gastroesophageal reflux disease)   ? RARE-NO MEDS  ? Headache   ? MIGRAINES  ? Hypertension   ? PT STATES SHE IS SUPPOSED TO BE TAKING LISINOPRIL-HCTZ BUT HAS BEEN OUT "FOR A WHILE" NEEDS TO GET ANOTHER PRESCRIPTION FROM HER PCP  ? Pancreatitis   ? Pilonidal cyst   ? TOOK ANTIBIOTIC THIS MONTH AND IT IS RESOLVED  ? Pneumonia 2018  ? ? ?Patient Active Problem List  ? Diagnosis Date Noted  ? Chronic pain syndrome 12/29/2020  ? Pharmacologic therapy 12/29/2020  ? Disorder of skeletal system 12/29/2020  ? Problems influencing health status 12/29/2020  ? History of marijuana use 12/29/2020  ? Abnormal MRI, cervical spine (05/06/2019) 12/29/2020  ? Abnormal MRI, thoracic spine (05/06/2019) 12/29/2020  ? Abnormal MRI, lumbar spine (05/06/2019) 12/29/2020  ? GERD (gastroesophageal reflux disease) 12/29/2020  ? NSAID induced gastritis 12/29/2020  ? Chronic low back pain (1ry area of Pain) (Bilateral) (L>R) w/o sciatica 12/29/2020  ? Chronic lower extremity pain (2ry area of Pain) (Bilateral) (L>R) 12/29/2020  ? Chronic sacroiliac joint pain (Bilateral) 12/29/2020  ? Lumbosacral facet syndrome (Bilateral) 12/29/2020   ? Lumbar facet hypertrophy (L4-5) (Bilateral) 12/29/2020  ? Lumbosacral facet arthropathy 12/29/2020  ? Somatic dysfunction of sacroiliac joints (Bilateral) 12/29/2020  ? Hypokalemia 12/11/2020  ? Acute blood loss anemia 12/07/2020  ? Vitamin B12 deficiency 12/06/2020  ? Rectal bleeding 12/04/2020  ? Precordial pain 11/28/2020  ? Palpitations 11/28/2020  ? Heart murmur 11/28/2020  ? Uncontrolled hypertension 11/28/2020  ? Right ovarian cyst   ? Acute bilateral low back pain with bilateral sciatica 06/01/2019  ? Foraminal stenosis of lumbar region 06/01/2019  ? Cervical cancer, FIGO stage IB1 ([place]) 05/17/2017  ? Ileus ([place]) 04/18/2017  ? Postoperative anemia due to acute blood loss 04/18/2017  ? Tobacco use 04/06/2017  ? Malignant neoplasm of overlapping sites of cervix ([place]) 04/06/2017  ? 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 Common bile duct stone   ? Pancreatitis 12/16/2016  ? Calculus of gallbladder with chronic cholecystitis without obstruction   ? Incarcerated epigastric hernia   ? ? ? ?Physical Exam  ?Triage Vital Signs: ?ED Triage Vitals  ?Enc Vitals Group  ?   BP 03/30/21 0104 (!) 141/90  ?   Pulse Rate 03/30/21 0104 78  ?   Resp 03/30/21 0104 16  ?   Temp --   ?   Temp src --   ?   SpO2 03/30/21 0104 96 %  ?   Weight 03/30/21 0105 231 lb (104.8 kg)  ?  Height 03/30/21 0105 '5\' 5"'$  (1.651 m)  ?   Head Circumference --   ?   Peak Flow --   ?   Pain Score 03/30/21 0105 8  ?   Pain Loc --   ?   Pain Edu? --   ?   Excl. in [place]? --   ? ? ?Most recent vital signs: ?Vitals:  ? 03/30/21 0500 03/30/21 0530  ?BP: 119/78 126/81  ?Pulse: 71 81  ?Resp: 16 (!) 30  ?SpO2: 96% 97%  ? ? ? ?General: Awake, no distress.  ?CV:  Good peripheral perfusion.  ?Resp:  Normal effort.  ?Abd:  No distention.  ?Neuro:             Awake, Alert, Oriented x 3  ?Other:  Asymmetric swelling of the upper lip, R > L, no swelling of the tongue, clear posterior oropharynx, no stridor, tolerating secretions ? ? ?ED Results / Procedures /  Treatments  ?Labs ?(all labs ordered are listed, but only abnormal results are displayed) ?Labs Reviewed - No data to display ? ? ?EKG ? ? ? ? ?RADIOLOGY ? ? ? ?PROCEDURES: ? ?Critical Care performed: No ? ? ? ?MEDICATIONS ORDERED IN ED: ?Medications  ?methylPREDNISolone sodium succinate (SOLU-MEDROL) 125 mg/2 mL injection 125 mg (125 mg Intravenous Given 03/30/21 0121)  ?diphenhydrAMINE (BENADRYL) injection 25 mg (25 mg Intravenous Given 03/30/21 0121)  ?famotidine (PEPCID) IVPB 20 mg premix (0 mg Intravenous Stopped 03/30/21 0210)  ?  Followed by  ?famotidine (PEPCID) IVPB 20 mg premix (0 mg Intravenous Stopped 03/30/21 0349)  ?gabapentin (NEURONTIN) capsule 300 mg (300 mg Oral Given 03/30/21 0416)  ? ? ? ?IMPRESSION / MDM / ASSESSMENT AND PLAN / ED COURSE  ?I reviewed the triage vital signs and the nursing notes. ?             ?               ? ?Differential diagnosis includes, but is not limited to, ACE inhibitor angioedema, hereditary angioedema, allergic reaction ? ?Patient is a 46 year old female presents with upper lip swelling x1 hour.  She is on lisinopril.  On exam she has asymmetric angioedema involving the upper lip right greater than left but no involvement of the tongue or posterior oropharynx.  She denies any subjective difficulty swallowing or voice changes.  Plan to observe.  We will give her Pepcid Benadryl and Solu-Medrol.  She has no other symptoms of allergic reaction or anaphylaxis.  I suspect that this is ACE inhibitor angioedema. ? ?2:00 am On repeat assessment patient has stable swelling of the upper lip, has not appeared to progress.  No tongue swelling she has no difficulty swallowing.  We will continue to monitor. ? ?430 am: No change in patient swelling, continues to have no voice changes or difficulty swallowing ? ?6:30 AM patient continues to have no progression of the angioedema, tolerating p.o.  This point she has been observed for almost 6 hours without progression.  Given no posterior  involvement I think she is appropriate for discharge.  Had a long discussion with the patient about strict return precautions for any progression in any symptoms of tongue swelling or difficulty swallowing or breathing.  Patient understood.  Also discussed that she should never take an ACE inhibitor again she should follow-up with her primary care provider for further management of her hypertension. ? ?  ? ? ?FINAL CLINICAL IMPRESSION(S) / ED DIAGNOSES  ? ?Final diagnoses:  ?Angioedema due to  angiotensin converting enzyme inhibitor (ACE-I)  ? ? ? ?Rx / DC Orders  ? ?ED Discharge Orders   ? ? None  ? ?  ? ? ? ?Note:  This document was prepared using Dragon voice recognition software and may include unintentional dictation errors. ?  ?Rada Hay, MD ?03/30/21 0630 ? ?

## 2021-04-08 IMAGING — DX DG KNEE COMPLETE 4+V*L*
4 series · 4 of 4 positions shown · non-contrast
Comparison: None.

CLINICAL DATA: Knee pain for 2 weeks, no known injury, initial
encounter

EXAM:
LEFT KNEE - COMPLETE 4+ VIEW

[knee lat]
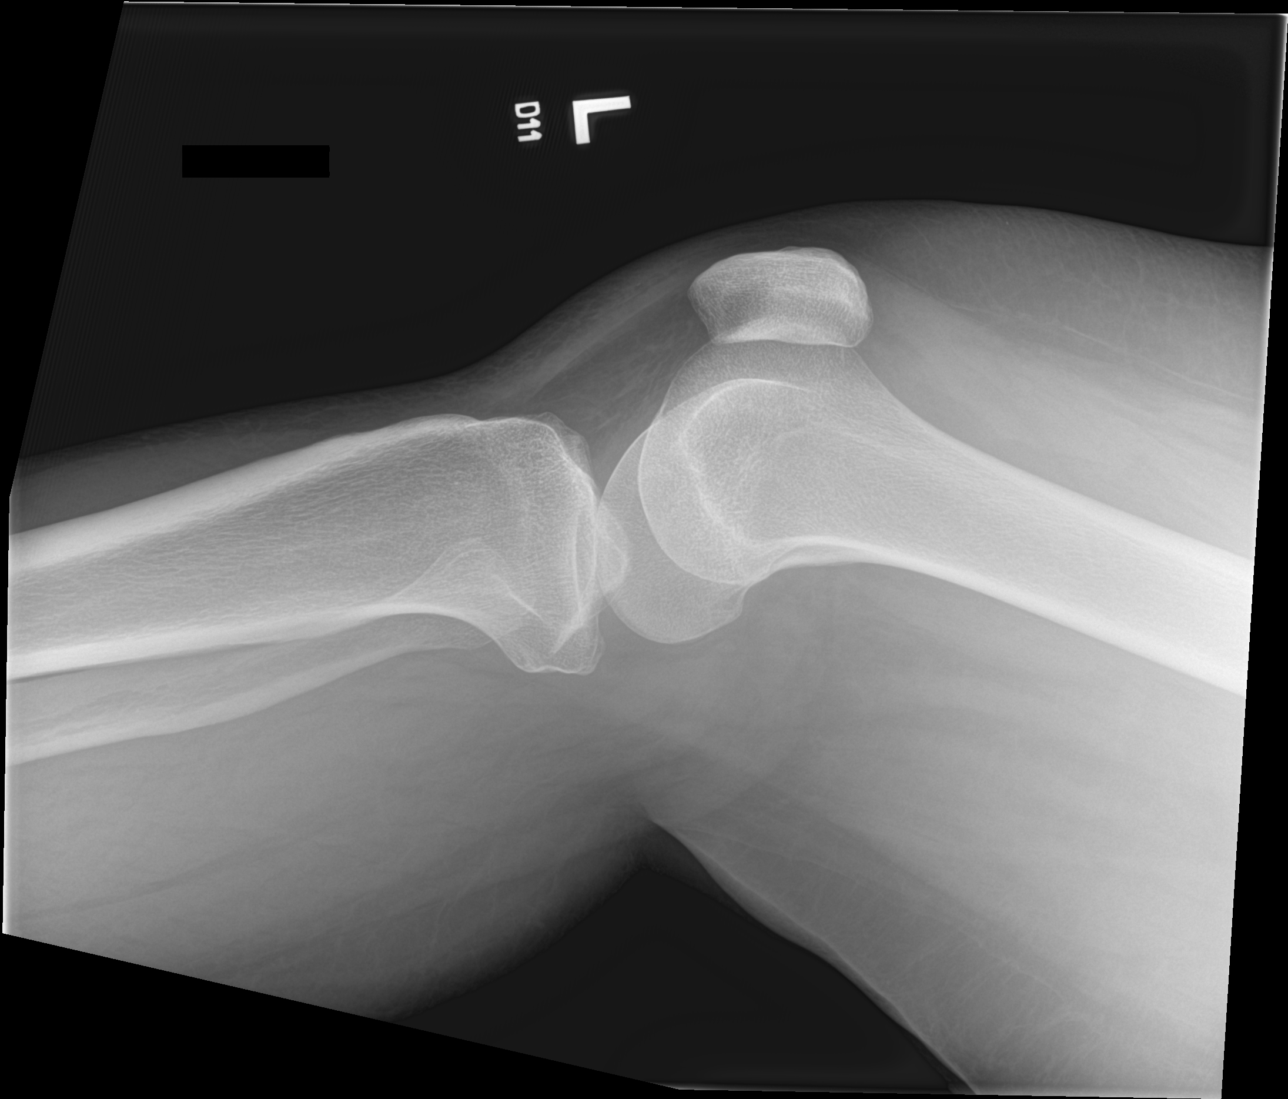

[knee obl (1 of 2)]
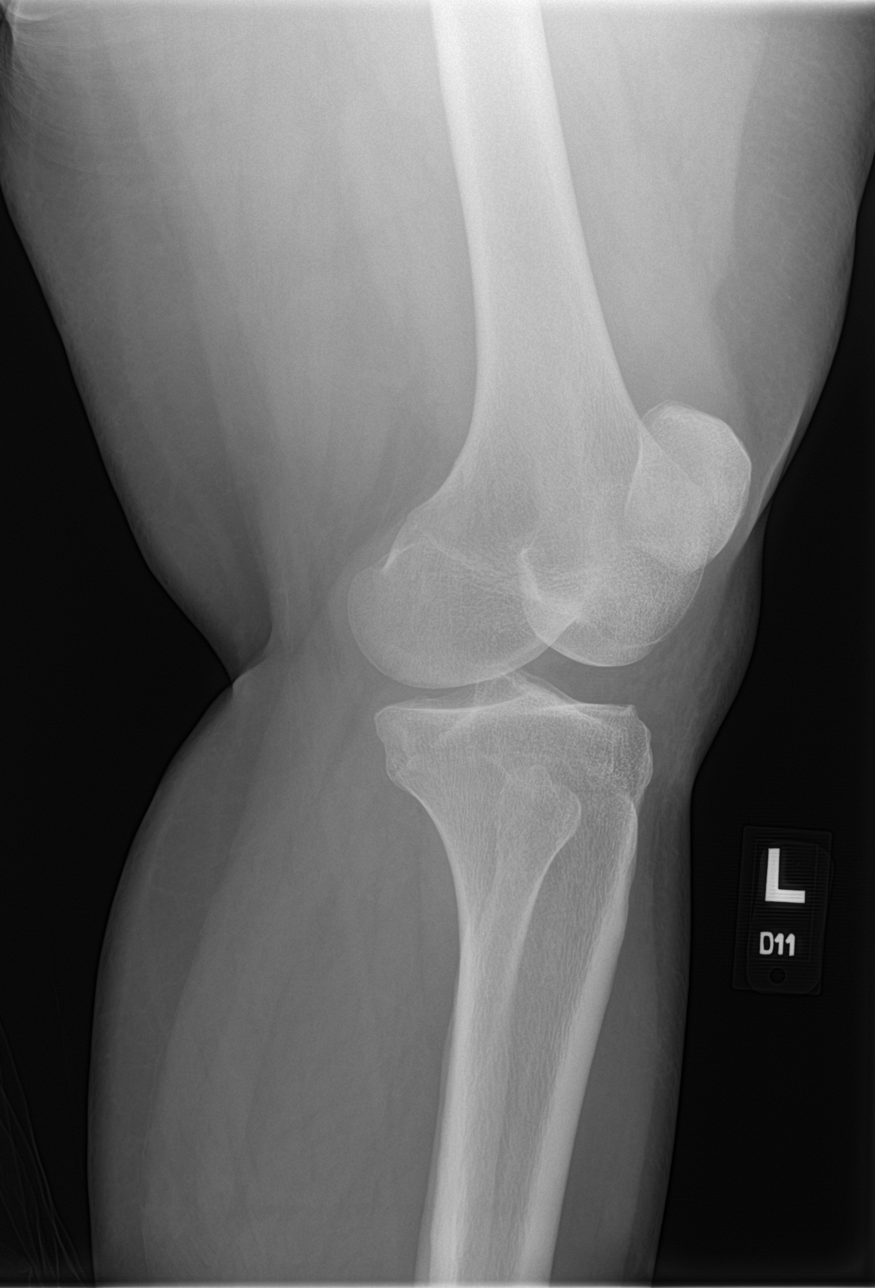

[knee obl (2 of 2)]
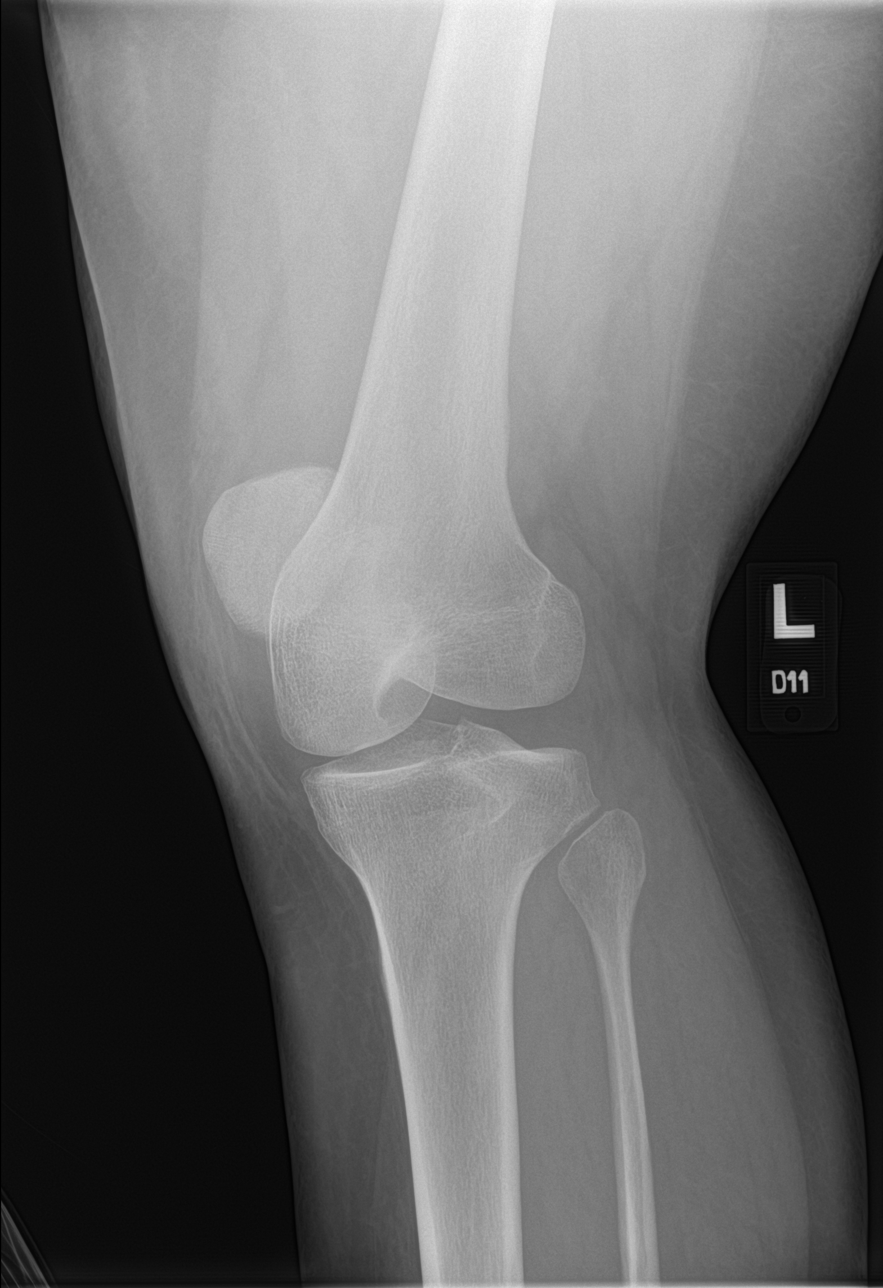

[knee ap]
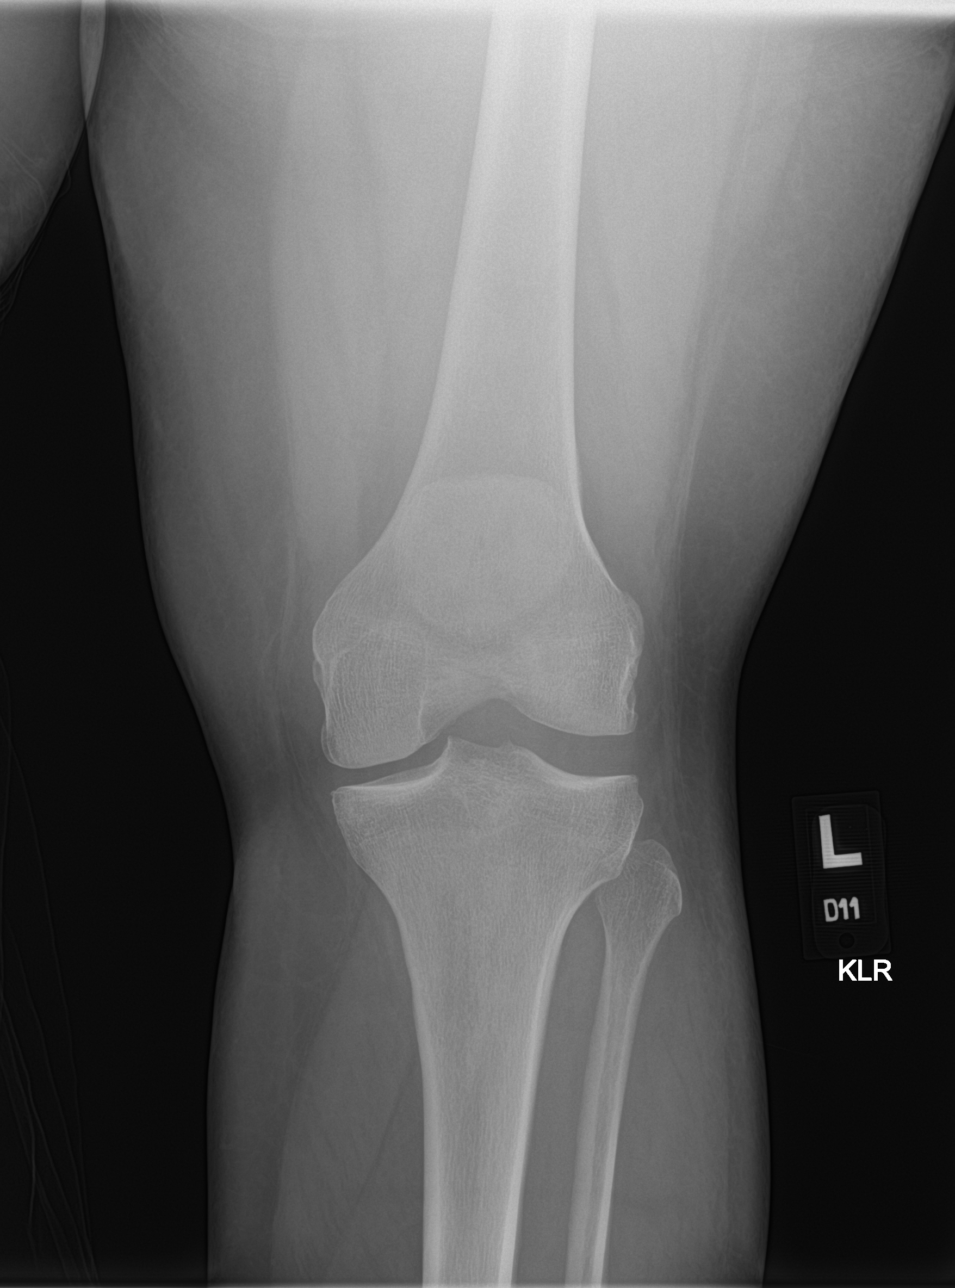

[4 of 4 positions shown; findings below may reference images not displayed]

FINDINGS: No acute fracture or dislocation is noted. Mild medial joint space
narrowing is noted. No joint effusion is seen. No soft tissue
abnormality is noted.
IMPRESSION: Mild degenerative change without acute abnormality.

## 2021-04-27 ENCOUNTER — Encounter: Payer: Self-pay | Admitting: Pain Medicine

## 2021-04-27 DIAGNOSIS — R892 Abnormal level of other drugs, medicaments and biological substances in specimens from other organs, systems and tissues: Secondary | ICD-10-CM | POA: Insufficient documentation

## 2021-04-27 DIAGNOSIS — E559 Vitamin D deficiency, unspecified: Secondary | ICD-10-CM | POA: Insufficient documentation

## 2021-04-27 DIAGNOSIS — R700 Elevated erythrocyte sedimentation rate: Secondary | ICD-10-CM | POA: Insufficient documentation

## 2021-04-27 DIAGNOSIS — F1290 Cannabis use, unspecified, uncomplicated: Secondary | ICD-10-CM | POA: Insufficient documentation

## 2021-04-30 ENCOUNTER — Other Ambulatory Visit: Payer: Self-pay | Admitting: Internal Medicine

## 2021-05-01 NOTE — Telephone Encounter (Signed)
Please contact pt for future appointment. Pt overdue for 1 month f/u. Pt needing refills. 

## 2021-05-05 ENCOUNTER — Telehealth: Payer: Self-pay | Admitting: Internal Medicine

## 2021-05-05 NOTE — Telephone Encounter (Signed)
Per [facility] updated the following: ? ?Hello..This pt was scheduled multiple times at the end of 12/2020 and then again in the beginning of 01/2021. She never showed for the CCTA. I am seeing in her chart she is scheduled to come visit you on Friday 05/22/2021. Would you like me to reach out to her to see if she could come now to have the CCTA done before her office visit with you? ? ?Per Dr. Saunders Revel: ?Let's hold off on rescheduling her until she comes to a follow-up visit so that we can reassess her symptoms ?

## 2021-05-12 ENCOUNTER — Other Ambulatory Visit: Payer: Medicaid Other

## 2021-05-12 NOTE — Telephone Encounter (Signed)
Please see note below. 

## 2021-05-21 ENCOUNTER — Other Ambulatory Visit: Payer: Medicaid Other

## 2021-05-22 ENCOUNTER — Ambulatory Visit: Payer: Medicaid Other | Admitting: Internal Medicine

## 2021-05-22 NOTE — Progress Notes (Deleted)
Follow-up Outpatient Visit Date: 05/22/2021  Primary Care Provider: [facility] [place] [address]  Chief Complaint: ***  HPI:  Cassidy Mathis is a 46 y.o. female with history of hypertension, asthma, anemia, cervical cancer, GERD, and pancreatitis, who presents for follow-up of chest pain.  I met her in 11/2020 following ED visit for chest pain.  She reported atypical chest pain as well as palpitations that had worsened over the preceding 2 months.  We agreed to obtain an echocardiogram and coronary CTA, neither of which the patient moved forward with.  We also decided to add amlodipine 5 mg daily to her standing doses of lisinopril and metoprolol succinate.  She presented to the ED last month with lip swelling, thought to be due to angioedema from lisinopril.  She was advised to discontinue lisinopril and to follow-up with her PCP for ongoing management of hypertension.  --------------------------------------------------------------------------------------------------  Cardiovascular History & Procedures: Cardiovascular Problems: Chest pain   Risk Factors: Hypertension and tobacco use   Cath/PCI: None   CV Surgery: None   EP Procedures and Devices: None   Non-Invasive Evaluation(s): None  Recent CV Pertinent Labs: Lab Results  Component Value Date   CHOL 160 11/16/2017   HDL 75 11/16/2017   LDLCALC 69 11/16/2017   TRIG 75 11/16/2017   CHOLHDL 2.1 11/16/2017   INR 1.0 12/04/2020   K 3.6 12/12/2020   K 3.2 (L) 08/25/2012   MG 2.2 12/29/2020   BUN 7 12/12/2020   BUN 8 11/28/2020   BUN 9 08/25/2012   CREATININE 0.84 12/12/2020   CREATININE 0.88 11/16/2017    Past medical and surgical history were reviewed and updated in EPIC.  No outpatient medications have been marked as taking for the 05/22/21 encounter (Appointment) with Raney Antwine, Cassidy Mathis, Cassidy Mathis.    Allergies: Ace inhibitors  Social History   Tobacco Use    Smoking status: Some Days    Packs/day: 0.50    Years: 12.00    Pack years: 6.00    Types: Cigarettes   Smokeless tobacco: Never   Tobacco comments:    11/28/2020 last smoked cigarettes 10 days ago  Vaping Use   Vaping Use: Never used  Substance Use Topics   Alcohol use: Yes    Comment: occassionally; less than 1/month   Drug use: Yes    Frequency: 1.0 times per week    Types: Marijuana    Family History  Problem Relation Age of Onset   Hypertension Mother    Gallstones Mother    Hypertension Father    Gout Father    Cancer Paternal Uncle        lung   Cancer Maternal Grandmother        not sure what type of cancer   Cancer Maternal Grandfather        not sure what type of cancer   Cancer Paternal Grandmother        not sure what type of cancer   Cancer Paternal Grandfather        not sure what type of cancer    Review of Systems: A 12-system review of systems was performed and was negative except as noted in the HPI.  --------------------------------------------------------------------------------------------------  Physical Exam: LMP 03/18/2017 (Exact Date)   General:  NAD. Neck: No JVD or HJR. Lungs: Clear to auscultation bilaterally without wheezes or crackles. Heart: Regular rate and rhythm without murmurs, rubs, or gallops. Abdomen: Soft, nontender, nondistended. Extremities: No lower  extremity edema.  EKG:  ***  Lab Results  Component Value Date   WBC 7.5 02/12/2021   HGB 12.3 02/12/2021   HCT 36.8 02/12/2021   MCV 88 02/12/2021   PLT 432 02/12/2021    Lab Results  Component Value Date   NA 138 12/12/2020   K 3.6 12/12/2020   CL 107 12/12/2020   CO2 24 12/12/2020   BUN 7 12/12/2020   CREATININE 0.84 12/12/2020   GLUCOSE 109 (H) 12/12/2020   ALT 19 12/04/2020    Lab Results  Component Value Date   CHOL 160 11/16/2017   HDL 75 11/16/2017   LDLCALC 69 11/16/2017   TRIG 75 11/16/2017   CHOLHDL 2.1 11/16/2017     --------------------------------------------------------------------------------------------------  ASSESSMENT AND PLAN: Cassidy Mathis, Cassidy Mathis

## 2021-05-25 ENCOUNTER — Encounter: Payer: Self-pay | Admitting: Internal Medicine

## 2021-05-26 ENCOUNTER — Encounter: Payer: Self-pay | Admitting: Internal Medicine

## 2021-05-26 NOTE — Progress Notes (Signed)
Unable to contact patient to schedule echo, letter sent, order cancelled.

## 2021-08-28 ENCOUNTER — Other Ambulatory Visit: Payer: Self-pay

## 2021-08-28 ENCOUNTER — Emergency Department
Admission: EM | Admit: 2021-08-28 | Discharge: 2021-08-28 | Disposition: A | Discharge status: Home / Self Care | Payer: Medicaid Other | Attending: Emergency Medicine | Admitting: Emergency Medicine

## 2021-08-28 DIAGNOSIS — Z85828 Personal history of other malignant neoplasm of skin: Secondary | ICD-10-CM | POA: Insufficient documentation

## 2021-08-28 DIAGNOSIS — J45909 Unspecified asthma, uncomplicated: Secondary | ICD-10-CM | POA: Diagnosis not present

## 2021-08-28 DIAGNOSIS — M436 Torticollis: Secondary | ICD-10-CM | POA: Insufficient documentation

## 2021-08-28 DIAGNOSIS — M542 Cervicalgia: Secondary | ICD-10-CM | POA: Diagnosis present

## 2021-08-28 DIAGNOSIS — I10 Essential (primary) hypertension: Secondary | ICD-10-CM | POA: Diagnosis not present

## 2021-08-28 MED ORDER — NAPROXEN 500 MG PO TABS: 500.0000 mg | ORAL_TABLET | Freq: Two times a day (BID) | ORAL | 0 refills | Status: DC

## 2021-08-28 MED ORDER — KETOROLAC TROMETHAMINE 60 MG/2ML IM SOLN
30.0000 mg | Freq: Once | INTRAMUSCULAR | Status: AC
  Administered 2021-08-28: 30 mg via INTRAMUSCULAR
  Filled 2021-08-28: qty 2

## 2021-08-28 MED ORDER — TIZANIDINE HCL 2 MG PO TABS
2.0000 mg | ORAL_TABLET | Freq: Once | ORAL | Status: AC
  Administered 2021-08-28: 2 mg via ORAL
  Filled 2021-08-28: qty 1

## 2021-08-28 MED ORDER — ORPHENADRINE CITRATE 30 MG/ML IJ SOLN
60.0000 mg | Freq: Once | INTRAMUSCULAR | Status: AC
  Administered 2021-08-28: 60 mg via INTRAMUSCULAR
  Filled 2021-08-28: qty 2

## 2021-08-28 MED ORDER — HYDROCODONE-ACETAMINOPHEN 5-325 MG PO TABS: 1.0000 | ORAL_TABLET | Freq: Four times a day (QID) | ORAL | 0 refills | Status: AC | PRN

## 2021-08-28 MED ORDER — OXYCODONE HCL 5 MG PO TABS
5.0000 mg | ORAL_TABLET | Freq: Once | ORAL | Status: AC
  Administered 2021-08-28: 5 mg via ORAL
  Filled 2021-08-28: qty 1

## 2021-08-28 MED ORDER — TIZANIDINE HCL 4 MG PO TABS: 4.0000 mg | ORAL_TABLET | Freq: Three times a day (TID) | ORAL | 0 refills | Status: DC

## 2021-08-28 NOTE — ED Triage Notes (Signed)
Pt to ED for thoraric and cervical back pain as well as lumbar pain with shooting down L leg since yesterday. Describes upper back pain as "muscle spasms", painful to turn head or lift arms. Describes lower back pain as sharp, shooting. Hx sciatica. Ambulatory with steady gait. Did not take regular BP meds this AM.

## 2021-08-28 NOTE — ED Provider Notes (Signed)
[facility] Provider Note    Event Date/Time   First MD Initiated Contact with Patient 08/28/21 1144     (approximate)   History   Back Pain   HPI  Anice L Fiorini is a 46 y.o. female with history of left-sided sciatica, and as listed below presents to the ER for treatment and evaluation of left side neck pain that radiates into the left mid and lateral neck and mid-back to under the scapula. No injury. Pain started yesterday upon awakening.    Past Medical History:  Diagnosis Date   Allergy    seasonal   Anemia    h/o   Asthma    Cancer (HCC)    cervical   Depression    GERD (gastroesophageal reflux disease)    RARE-NO MEDS   Headache    MIGRAINES   Hypertension    PT STATES SHE IS SUPPOSED TO BE TAKING LISINOPRIL-HCTZ BUT HAS BEEN OUT "FOR A WHILE" NEEDS TO GET ANOTHER PRESCRIPTION FROM HER PCP   Pancreatitis    Pilonidal cyst    TOOK ANTIBIOTIC THIS MONTH AND IT IS RESOLVED   Pneumonia 2018     Physical Exam   Triage Vital Signs: ED Triage Vitals  Enc Vitals Group     BP 08/28/21 1126 138/88     Pulse Rate 08/28/21 1126 77     Resp 08/28/21 1126 16     Temp 08/28/21 1126 98.7 F (37.1 C)     Temp Source 08/28/21 1126 Oral     SpO2 08/28/21 1126 97 %     Weight 08/28/21 1123 230 lb (104.3 kg)     Height 08/28/21 1123 '5\' 5"'$  (1.651 m)     Head Circumference --      Peak Flow --      Pain Score 08/28/21 1122 10     Pain Loc --      Pain Edu? --      Excl. in [place]? --     Most recent vital signs: Vitals:   08/28/21 1126 08/28/21 1346  BP: 138/88 130/83  Pulse: 77 70  Resp: 16 16  Temp: 98.7 F (37.1 C)   SpO2: 97% 98%    General: Awake, no distress.  CV:  Good peripheral perfusion.  Resp:  Normal effort.  Abd:  No distention.  Other:  Non focal tenderness in the area of the left trapezius.   ED Results / Procedures / Treatments   Labs (all labs ordered are listed, but only abnormal results are displayed) Labs  Reviewed - No data to display   EKG  Not indicated   RADIOLOGY  Not indicated  I have independently reviewed and interpreted imaging as well as reviewed report from radiology.  PROCEDURES:  Critical Care performed: No  Procedures   MEDICATIONS ORDERED IN ED:  Medications  ketorolac (TORADOL) injection 30 mg (30 mg Intramuscular Given 08/28/21 1200)  tiZANidine (ZANAFLEX) tablet 2 mg (2 mg Oral Given 08/28/21 1200)  oxyCODONE (Oxy IR/ROXICODONE) immediate release tablet 5 mg (5 mg Oral Given 08/28/21 1200)  orphenadrine (NORFLEX) injection 60 mg (60 mg Intramuscular Given 08/28/21 1303)     IMPRESSION / MDM / ASSESSMENT AND PLAN / ED COURSE   I reviewed the triage vital signs and the nursing notes.  Differential diagnosis includes, but is not limited to: Acute torticollis, muscle strain  Patient's presentation is most consistent with acute, uncomplicated illness.  46 year old female presenting to the emergency department for  treatment and evaluation of acute onset of left neck and back pain that radiates into the left shoulder and anterior chest wall.  Symptoms started upon awakening yesterday morning.  No specific injury.  Similar symptoms in the past has never lasted this long.  No relief with medications to treat sciatica.  IM Toradol and p.o. tizanidine ordered.  No indication for imaging as she has had no new injury and has no systemic symptoms.  Some improvement after Norflex. She will be discharged home medications have been submitted to her pharmacy.  She was encouraged to return to the emergency department if she is unable to see primary care if symptoms do not prove.     FINAL CLINICAL IMPRESSION(S) / ED DIAGNOSES   Final diagnoses:  Acute torticollis     Rx / DC Orders   ED Discharge Orders          Ordered    naproxen (NAPROSYN) 500 MG tablet  2 times daily with meals        08/28/21 1332    tiZANidine (ZANAFLEX) 4 MG tablet  3 times daily         08/28/21 1332    HYDROcodone-acetaminophen (NORCO/VICODIN) 5-325 MG tablet  Every 6 hours PRN        08/28/21 1332             Note:  This document was prepared using Dragon voice recognition software and may include unintentional dictation errors.   Victorino Dike, FNP 08/28/21 1913    Carrie Mew, MD 08/28/21 1921

## 2021-08-28 NOTE — Discharge Instructions (Signed)
Please follow up with primary care if not improving over the week.  Return to the ER for symptoms that change, worsen, or for new concerns if unable to schedule an appointment

## 2021-08-28 NOTE — ED Notes (Signed)
See triage note  Presents with pain to upper back  Pain is mainly between shoulder blades   Denies any recent injury   Pain increases with movement

## 2021-09-06 NOTE — Progress Notes (Unsigned)
PROVIDER NOTE: Information contained herein reflects review and annotations entered in association with encounter. Interpretation of such information and data should be left to medically-trained personnel. Information provided to patient can be located elsewhere in the medical record under "Patient Instructions". Document created using STT-dictation technology, any transcriptional errors that may result from process are unintentional.    Patient: Cassidy Mathis  Service Category: E/M  Provider: Gaspar Cola, MD  DOB: Feb 01, 1975  DOS: 09/09/2021  Referring Provider: Center, Jenny Reichmann*  MRN: [id number]  Specialty: Interventional Pain Management  PCP: [facility], [place]  Type: Established Patient  Setting: Ambulatory outpatient    Location: Office  Delivery: Face-to-face     Primary Reason(s) for Visit: Encounter for evaluation before starting new chronic pain management plan of care (Level of risk: moderate) CC: No chief complaint on file.  HPI  Cassidy Mathis is a 46 y.o. year old, female patient, who comes today for a follow-up evaluation to review the test results and decide on a treatment plan. 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[place]); Cervical cancer, FIGO stage IB1 ([place]); Ileus ([place]); Tobacco use; Postoperative anemia due to acute blood loss; Right ovarian cyst; Precordial pain; Palpitations; Heart murmur; Uncontrolled hypertension; Rectal bleeding; Vitamin B12 deficiency; Acute blood loss anemia; Hypokalemia; Acute bilateral low back pain with bilateral sciatica; Foraminal stenosis of lumbar region; Malignant neoplasm of overlapping sites of cervix ([place]); Chronic pain syndrome; Pharmacologic therapy; Disorder of skeletal system; Problems influencing health status; History of marijuana use; Abnormal MRI, cervical spine  (05/06/2019); Abnormal MRI, thoracic spine (05/06/2019); Abnormal MRI, lumbar spine (05/06/2019); GERD (gastroesophageal reflux disease); NSAID induced gastritis; Chronic low back pain (1ry area of Pain) (Bilateral) (L>R) w/o sciatica; Chronic lower extremity pain (2ry area of Pain) (Bilateral) (L>R); Chronic sacroiliac joint pain (Bilateral); Lumbosacral facet syndrome (Bilateral); Lumbar facet hypertrophy (L4-5) (Bilateral); Lumbosacral facet arthropathy; Somatic dysfunction of sacroiliac joints (Bilateral); Elevated sed rate; Vitamin D deficiency; Marijuana use; and Abnormal drug screen (12/29/2020) on their problem list. Her primarily concern today is the No chief complaint on file.  Pain Assessment: Location:     Radiating:   Onset:   Duration:   Quality:   Severity:  /10 (subjective, self-reported pain score)  Effect on ADL:   Timing:   Modifying factors:   BP:    HR:    Cassidy Mathis comes in today for a follow-up visit after her initial evaluation on Visit date not found. Today we went over the results of her tests. These were explained in "[place]'s terms". During today's appointment we went over my diagnostic impression, as well as the proposed treatment plan.  ***  In considering the treatment plan options, Cassidy Mathis was reminded that I no longer take patients for medication management only. I asked her to let me know if she had no intention of taking advantage of the interventional therapies, so that we could make arrangements to provide this space to someone interested. I also made it clear that undergoing interventional therapies for the purpose of getting pain medications is very inappropriate on the part of a patient, and it will not be tolerated in this practice. This type of behavior would suggest true addiction and therefore it requires referral to an addiction specialist.   Further details on both, my assessment(s), as well as the proposed treatment plan, please see  below.  Controlled Substance Pharmacotherapy Assessment REMS (  Risk Evaluation and Mitigation Strategy)  Opioid Analgesic: .   None MME/day: 0 mg/day  Pill Count: None expected due to no prior prescriptions written by our practice. No notes on file Pharmacokinetics: Liberation and absorption (onset of action): WNL Distribution (time to peak effect): WNL Metabolism and excretion (duration of action): WNL         Pharmacodynamics: Desired effects: Analgesia: Ms. Chuang reports >50% benefit. Functional ability: Patient reports that medication allows her to accomplish basic ADLs Clinically meaningful improvement in function (CMIF): Sustained CMIF goals met Perceived effectiveness: Described as relatively effective, allowing for increase in activities of daily living (ADL) Undesirable effects: Side-effects or Adverse reactions: None reported Monitoring: [place] PMP: PDMP reviewed during this encounter. Online review of the past 15-month period previously conducted. Not applicable at this point since we have not taken over the patient's medication management yet. List of other Serum/Urine Drug Screening Test(s):  Lab Results  Component Value Date   COCAINSCRNUR NONE DETECTED 01/01/2020   COCAINSCRNUR NONE DETECTED 03/23/2017   COCAINSCRNUR NONE DETECTED 10/21/2015   COCAINSCRNUR NONE DETECTED 04/18/2015   THCU POSITIVE (A) 01/01/2020   THCU POSITIVE (A) 03/23/2017   THCU POSITIVE (A) 10/21/2015   THCU POSITIVE (A) 04/18/2015   List of all UDS test(s) done:  Lab Results  Component Value Date   SUMMARY Note 12/29/2020   Last UDS on record: Summary  Date Value Ref Range Status  12/29/2020 Note  Final    Comment:    ==================================================================== Compliance Drug Analysis, Ur ==================================================================== Test                             Result       Flag       Units  Drug Present and Declared for Prescription  Verification   Butalbital                     PRESENT      EXPECTED   Gabapentin                     PRESENT      EXPECTED  Drug Present not Declared for Prescription Verification   Carboxy-THC                    96           UNEXPECTED ng/mg creat    Carboxy-THC is a metabolite of tetrahydrocannabinol (THC). Source of    THC is most commonly herbal marijuana or marijuana-based products,    but THC is also present in a scheduled prescription medication.    Trace amounts of THC can be present in hemp and cannabidiol (CBD)    products. This test is not intended to distinguish between delta-9-    tetrahydrocannabinol, the predominant form of THC in most herbal or    marijuana-based products, and delta-8-tetrahydrocannabinol.  Drug Absent but Declared for Prescription Verification   Acetaminophen                  Not Detected UNEXPECTED    Acetaminophen, as indicated in the declared medication list, is not    always detected even when used as directed.  ==================================================================== Test                      Result    Flag   Units      Ref Range   Creatinine  54               mg/dL      >=20 ==================================================================== Declared Medications:  The flagging and interpretation on this report are based on the  following declared medications.  Unexpected results may arise from  inaccuracies in the declared medications.   **Note: The testing scope of this panel includes these medications:   Butalbital (Fioricet)  Gabapentin (Neurontin)   **Note: The testing scope of this panel does not include small to  moderate amounts of these reported medications:   Acetaminophen (Fioricet)   **Note: The testing scope of this panel does not include the  following reported medications:   Albuterol (Ventolin HFA)  Amlodipine (Norvasc)  Caffeine (Fioricet)  Cyanocobalamin  Esomeprazole (Nexium)  Lisinopril  (Zestril) ==================================================================== For clinical consultation, please call [phone]. ====================================================================    UDS interpretation: No unexpected findings.          Medication Assessment Form: Not applicable. No opioids. Treatment compliance: Not applicable Risk Assessment Profile: Aberrant behavior: See initial evaluations. None observed or detected today Comorbid factors increasing risk of overdose: See initial evaluation. No additional risks detected today Opioid risk tool (ORT):     12/29/2020   12:38 PM  Opioid Risk   Alcohol 0  Illegal Drugs 0  Rx Drugs 0  Alcohol 0  Illegal Drugs 0  Rx Drugs 0  Age between 16-45 years  1  History of Preadolescent Sexual Abuse 0  Psychological Disease 0  Depression 1  Opioid Risk Tool Scoring 2  Opioid Risk Interpretation Low Risk    ORT Scoring interpretation table:  Score <3 = Low Risk for SUD  Score between 4-7 = Moderate Risk for SUD  Score >8 = High Risk for Opioid Abuse   Risk of substance use disorder (SUD): Low  Risk Mitigation Strategies:  Patient opioid safety counseling: No controlled substances prescribed. Patient-Prescriber Agreement (PPA): No agreement signed.  Controlled substance notification to other providers: None required. No opioid therapy.  Pharmacologic Plan: Non-opioid analgesic therapy offered. Interventional alternatives discussed.             Laboratory Chemistry Profile   Renal Lab Results  Component Value Date   BUN 7 12/12/2020   CREATININE 0.84 12/12/2020   BCR 8 (L) 11/28/2020   GFRAA >60 05/05/2019   GFRNONAA >60 12/12/2020   PROTEINUR NEGATIVE 12/02/2019     Electrolytes Lab Results  Component Value Date   NA 138 12/12/2020   K 3.6 12/12/2020   CL 107 12/12/2020   CALCIUM 8.3 (L) 12/12/2020   MG 2.2 12/29/2020   PHOS 3.4 12/08/2020     Hepatic Lab Results  Component Value Date   AST  18 12/04/2020   ALT 19 12/04/2020   ALBUMIN 3.6 12/04/2020   ALKPHOS 59 12/04/2020   LIPASE 87 (H) 12/04/2020     ID Lab Results  Component Value Date   HIV Non Reactive 12/04/2020   SARSCOV2NAA NEGATIVE 12/04/2020   HCVAB 0.1 12/16/2016   PREGTESTUR NEGATIVE 03/23/2017     Bone Lab Results  Component Value Date   25OHVITD1 8.9 (L) 12/29/2020   25OHVITD2 <1.0 12/29/2020   25OHVITD3 8.9 12/29/2020     Endocrine Lab Results  Component Value Date   GLUCOSE 109 (H) 12/12/2020   GLUCOSEU NEGATIVE 12/02/2019   HGBA1C 5.5 12/15/2016   TSH 0.683 12/15/2016     Neuropathy Lab Results  Component Value Date   VITAMINB12 1,812 (H) 12/10/2020   FOLATE >24.8  12/05/2020   HGBA1C 5.5 12/15/2016   HIV Non Reactive 12/04/2020     CNS No results found for: "COLORCSF", "APPEARCSF", "RBCCOUNTCSF", "WBCCSF", "POLYSCSF", "LYMPHSCSF", "EOSCSF", "PROTEINCSF", "GLUCCSF", "JCVIRUS", "CSFOLI", "IGGCSF", "LABACHR", "ACETBL"   Inflammation (CRP: Acute  ESR: Chronic) Lab Results  Component Value Date   CRP 8 12/29/2020   ESRSEDRATE 62 (H) 12/29/2020     Rheumatology Lab Results  Component Value Date   LABURIC 5.4 03/24/2020     Coagulation Lab Results  Component Value Date   INR 1.0 12/04/2020   LABPROT 13.5 12/04/2020   APTT 28 03/22/2017   PLT 432 02/12/2021     Cardiovascular Lab Results  Component Value Date   CKTOTAL 191 (H) 01/04/2018   CKMB < 0.5 (L) 02/03/2012   TROPONINI <0.03 10/04/2015   HGB 12.3 02/12/2021   HCT 36.8 02/12/2021     Screening Lab Results  Component Value Date   SARSCOV2NAA NEGATIVE 12/04/2020   HCVAB 0.1 12/16/2016   HIV Non Reactive 12/04/2020   PREGTESTUR NEGATIVE 03/23/2017     Cancer No results found for: "CEA", "CA125", "LABCA2"   Allergens No results found for: "ALMOND", "APPLE", "ASPARAGUS", "AVOCADO", "BANANA", "BARLEY", "BASIL", "BAYLEAF", "GREENBEAN", "LIMABEAN", "WHITEBEAN", "BEEFIGE", "REDBEET", "BLUEBERRY", "BROCCOLI",  "CABBAGE", "MELON", "CARROT", "CASEIN", "CASHEWNUT", "CAULIFLOWER", "CELERY"     Note: Lab results reviewed.  Recent Diagnostic Imaging Review  Cervical Imaging: Cervical MR wo contrast: Results for orders placed during the hospital encounter of 05/05/19  MR Cervical Spine Wo Contrast  Narrative CLINICAL DATA:  Initial evaluation for acute right upper extremity numbness, paresthesias.  EXAM: MRI CERVICAL, THORACIC AND LUMBAR SPINE WITHOUT CONTRAST  TECHNIQUE: Multiplanar and multiecho pulse sequences of the cervical spine, to include the craniocervical junction and cervicothoracic junction, and thoracic and lumbar spine, were obtained without intravenous contrast.  COMPARISON:  None.  FINDINGS: MRI CERVICAL SPINE FINDINGS  Alignment: Reversal of the normal cervical lordosis without listhesis.  Vertebrae: Vertebral body height maintained without evidence for acute or chronic fracture. Bone marrow signal intensity diffusely decreased on T1 weighted imaging, nonspecific, but most commonly related to anemia, smoking, or obesity. No discrete or worrisome osseous lesions. No abnormal marrow edema.  Cord: Signal intensity within the cervical spinal cord is normal.  Posterior Fossa, vertebral arteries, paraspinal tissues: Visualized brain and posterior fossa within normal limits. Craniocervical junction normal. Paraspinous and prevertebral soft tissues within normal limits. Normal intravascular flow voids seen within the vertebral arteries bilaterally.  Disc levels:  C2-C3: Unremarkable.  C3-C4: Mild disc bulge. Superimposed tiny central disc protrusion minimally indents the ventral thecal sac (series 4, image 9). No significant spinal stenosis or cord deformity. Foramina remain patent.  C4-C5: Mild diffuse disc bulge. Mild flattening of the ventral thecal sac without significant spinal stenosis or cord deformity. Foramina remain patent.  C5-C6: Mild diffuse disc  bulge. Associated small central annular fissure. Flattening of the ventral thecal sac without significant spinal stenosis or cord deformity. Foramina remain patent.  C6-C7: Mild diffuse disc bulge with bilateral uncovertebral hypertrophy. Disc bulge slightly asymmetric to the right with flattening of the right ventral thecal sac and minimal flattening of the right hemi cord. Ventral right C7 nerve root could be affected. No significant spinal stenosis. Foramina remain patent.  C7-T1:  Unremarkable.  MRI THORACIC SPINE FINDINGS  Alignment: Physiologic with preservation of the normal thoracic kyphosis. No listhesis.  Vertebrae: Vertebral body height maintained without evidence for acute or chronic fracture. Bone marrow signal intensity diffusely decreased on T1 weighted imaging, nonspecific, but  most commonly related to anemia, smoking, or obesity. Few scattered subcentimeter benign hemangiomata noted. No worrisome osseous lesions. No abnormal marrow edema.  Cord: Signal intensity within the thoracic spinal cord is normal. Normal cord caliber morphology.  Paraspinal and other soft tissues: Unremarkable.  Disc levels:  No significant disc pathology seen within the thoracic spine. No focal disc herniation or significant disc bulge. No canal or neural foraminal stenosis. No impingement.  MRI LUMBAR SPINE FINDINGS  Segmentation: Standard. Lowest well-formed disc space labeled the L5-S1 level.  Alignment: Physiologic with preservation of the normal lumbar lordosis. No listhesis.  Vertebrae: Vertebral body height maintained without evidence for acute or chronic fracture. Bone marrow signal intensity diffusely decreased on T1 weighted imaging, nonspecific, but most commonly related to anemia, smoking or obesity. Subcentimeter benign hemangioma noted within the T12 vertebral body. No other discrete or worrisome osseous lesions. No abnormal marrow edema.  Conus medullaris and  cauda equina: Conus extends to the L2 level. Conus and cauda equina appear normal.  Paraspinal and other soft tissues: Paraspinous soft tissues within normal limits. Subcentimeter simple cyst noted within the interpolar right kidney. Visualized visceral structures otherwise unremarkable.  Disc levels:  L1-2:  Unremarkable.  L2-3:  Unremarkable.  L3-4: Mild diffuse disc bulge with disc desiccation. No significant spinal stenosis. Foramina remain patent.  L4-5: Mild diffuse disc bulge with disc desiccation. Mild bilateral facet hypertrophy. No canal or lateral recess stenosis. Mild left L4 foraminal narrowing. No significant right foraminal stenosis.  L5-S1: Mild disc bulge with disc desiccation. No significant canal or lateral recess stenosis. Foramina remain patent.  IMPRESSION: MRI CERVICAL SPINE IMPRESSION:  1. No acute abnormality within the cervical spine. Normal MRI appearance of the cervical spinal cord. 2. Right eccentric disc bulge at C6-7 with secondary mild flattening of the right hemi cord. The ventral right C7 nerve root could be affected. 3. Additional mild noncompressive disc bulging at C3-4 through C5-6 without stenosis or impingement.  MRI THORACIC SPINE IMPRESSION:  Normal MRI of the thoracic spine and spinal cord. No significant disc pathology, stenosis, or evidence for neural impingement.  MRI LUMBAR SPINE IMPRESSION:  1. No acute abnormality within the lumbar spine. 2. Mild disc bulging at L4-5 with resultant mild left L4 foraminal stenosis. 3. Additional mild noncompressive disc bulging at L3-4 and L5-S1 without stenosis or impingement. 4. Diffusely decreased T1 weighted signal intensity throughout the visualized bone marrow, nonspecific, but most commonly related to anemia, smoking, or obesity. Correlation with history and laboratory values recommended.   Electronically Signed By: Jeannine Boga M.D. On: 05/06/2019 01:19  Cervical MR  wo contrast: No valid procedures specified. Cervical CT wo contrast: No results found for this or any previous visit.  Cervical DG Bending/F/E views: No results found for this or any previous visit.   Shoulder Imaging: Shoulder-R MR wo contrast: No results found for this or any previous visit.  Shoulder-L MR wo contrast: No results found for this or any previous visit.  Shoulder-R DG: No results found for this or any previous visit.  Shoulder-L DG: No results found for this or any previous visit.   Thoracic Imaging: Thoracic MR wo contrast: Results for orders placed during the hospital encounter of 05/05/19  MR THORACIC SPINE WO CONTRAST  Narrative CLINICAL DATA:  Initial evaluation for acute right upper extremity numbness, paresthesias.  EXAM: MRI CERVICAL, THORACIC AND LUMBAR SPINE WITHOUT CONTRAST  TECHNIQUE: Multiplanar and multiecho pulse sequences of the cervical spine, to include the craniocervical junction and cervicothoracic  junction, and thoracic and lumbar spine, were obtained without intravenous contrast.  COMPARISON:  None.  FINDINGS: MRI CERVICAL SPINE FINDINGS  Alignment: Reversal of the normal cervical lordosis without listhesis.  Vertebrae: Vertebral body height maintained without evidence for acute or chronic fracture. Bone marrow signal intensity diffusely decreased on T1 weighted imaging, nonspecific, but most commonly related to anemia, smoking, or obesity. No discrete or worrisome osseous lesions. No abnormal marrow edema.  Cord: Signal intensity within the cervical spinal cord is normal.  Posterior Fossa, vertebral arteries, paraspinal tissues: Visualized brain and posterior fossa within normal limits. Craniocervical junction normal. Paraspinous and prevertebral soft tissues within normal limits. Normal intravascular flow voids seen within the vertebral arteries bilaterally.  Disc levels:  C2-C3: Unremarkable.  C3-C4: Mild disc bulge.  Superimposed tiny central disc protrusion minimally indents the ventral thecal sac (series 4, image 9). No significant spinal stenosis or cord deformity. Foramina remain patent.  C4-C5: Mild diffuse disc bulge. Mild flattening of the ventral thecal sac without significant spinal stenosis or cord deformity. Foramina remain patent.  C5-C6: Mild diffuse disc bulge. Associated small central annular fissure. Flattening of the ventral thecal sac without significant spinal stenosis or cord deformity. Foramina remain patent.  C6-C7: Mild diffuse disc bulge with bilateral uncovertebral hypertrophy. Disc bulge slightly asymmetric to the right with flattening of the right ventral thecal sac and minimal flattening of the right hemi cord. Ventral right C7 nerve root could be affected. No significant spinal stenosis. Foramina remain patent.  C7-T1:  Unremarkable.  MRI THORACIC SPINE FINDINGS  Alignment: Physiologic with preservation of the normal thoracic kyphosis. No listhesis.  Vertebrae: Vertebral body height maintained without evidence for acute or chronic fracture. Bone marrow signal intensity diffusely decreased on T1 weighted imaging, nonspecific, but most commonly related to anemia, smoking, or obesity. Few scattered subcentimeter benign hemangiomata noted. No worrisome osseous lesions. No abnormal marrow edema.  Cord: Signal intensity within the thoracic spinal cord is normal. Normal cord caliber morphology.  Paraspinal and other soft tissues: Unremarkable.  Disc levels:  No significant disc pathology seen within the thoracic spine. No focal disc herniation or significant disc bulge. No canal or neural foraminal stenosis. No impingement.  MRI LUMBAR SPINE FINDINGS  Segmentation: Standard. Lowest well-formed disc space labeled the L5-S1 level.  Alignment: Physiologic with preservation of the normal lumbar lordosis. No listhesis.  Vertebrae: Vertebral body height  maintained without evidence for acute or chronic fracture. Bone marrow signal intensity diffusely decreased on T1 weighted imaging, nonspecific, but most commonly related to anemia, smoking or obesity. Subcentimeter benign hemangioma noted within the T12 vertebral body. No other discrete or worrisome osseous lesions. No abnormal marrow edema.  Conus medullaris and cauda equina: Conus extends to the L2 level. Conus and cauda equina appear normal.  Paraspinal and other soft tissues: Paraspinous soft tissues within normal limits. Subcentimeter simple cyst noted within the interpolar right kidney. Visualized visceral structures otherwise unremarkable.  Disc levels:  L1-2:  Unremarkable.  L2-3:  Unremarkable.  L3-4: Mild diffuse disc bulge with disc desiccation. No significant spinal stenosis. Foramina remain patent.  L4-5: Mild diffuse disc bulge with disc desiccation. Mild bilateral facet hypertrophy. No canal or lateral recess stenosis. Mild left L4 foraminal narrowing. No significant right foraminal stenosis.  L5-S1: Mild disc bulge with disc desiccation. No significant canal or lateral recess stenosis. Foramina remain patent.  IMPRESSION: MRI CERVICAL SPINE IMPRESSION:  1. No acute abnormality within the cervical spine. Normal MRI appearance of the cervical spinal cord. 2. Right  eccentric disc bulge at C6-7 with secondary mild flattening of the right hemi cord. The ventral right C7 nerve root could be affected. 3. Additional mild noncompressive disc bulging at C3-4 through C5-6 without stenosis or impingement.  MRI THORACIC SPINE IMPRESSION:  Normal MRI of the thoracic spine and spinal cord. No significant disc pathology, stenosis, or evidence for neural impingement.  MRI LUMBAR SPINE IMPRESSION:  1. No acute abnormality within the lumbar spine. 2. Mild disc bulging at L4-5 with resultant mild left L4 foraminal stenosis. 3. Additional mild noncompressive disc  bulging at L3-4 and L5-S1 without stenosis or impingement. 4. Diffusely decreased T1 weighted signal intensity throughout the visualized bone marrow, nonspecific, but most commonly related to anemia, smoking, or obesity. Correlation with history and laboratory values recommended.   Electronically Signed By: Jeannine Boga M.D. On: 05/06/2019 01:19  Thoracic MR wo contrast: No valid procedures specified. Thoracic CT wo contrast: No results found for this or any previous visit.  Thoracic DG 4 views: No results found for this or any previous visit.  Thoracic DG w/swimmers view: No results found for this or any previous visit.   Lumbosacral Imaging: Lumbar MR wo contrast: Results for orders placed during the hospital encounter of 05/05/19  MR LUMBAR SPINE WO CONTRAST  Narrative CLINICAL DATA:  Initial evaluation for acute right upper extremity numbness, paresthesias.  EXAM: MRI CERVICAL, THORACIC AND LUMBAR SPINE WITHOUT CONTRAST  TECHNIQUE: Multiplanar and multiecho pulse sequences of the cervical spine, to include the craniocervical junction and cervicothoracic junction, and thoracic and lumbar spine, were obtained without intravenous contrast.  COMPARISON:  None.  FINDINGS: MRI CERVICAL SPINE FINDINGS  Alignment: Reversal of the normal cervical lordosis without listhesis.  Vertebrae: Vertebral body height maintained without evidence for acute or chronic fracture. Bone marrow signal intensity diffusely decreased on T1 weighted imaging, nonspecific, but most commonly related to anemia, smoking, or obesity. No discrete or worrisome osseous lesions. No abnormal marrow edema.  Cord: Signal intensity within the cervical spinal cord is normal.  Posterior Fossa, vertebral arteries, paraspinal tissues: Visualized brain and posterior fossa within normal limits. Craniocervical junction normal. Paraspinous and prevertebral soft tissues within normal limits. Normal  intravascular flow voids seen within the vertebral arteries bilaterally.  Disc levels:  C2-C3: Unremarkable.  C3-C4: Mild disc bulge. Superimposed tiny central disc protrusion minimally indents the ventral thecal sac (series 4, image 9). No significant spinal stenosis or cord deformity. Foramina remain patent.  C4-C5: Mild diffuse disc bulge. Mild flattening of the ventral thecal sac without significant spinal stenosis or cord deformity. Foramina remain patent.  C5-C6: Mild diffuse disc bulge. Associated small central annular fissure. Flattening of the ventral thecal sac without significant spinal stenosis or cord deformity. Foramina remain patent.  C6-C7: Mild diffuse disc bulge with bilateral uncovertebral hypertrophy. Disc bulge slightly asymmetric to the right with flattening of the right ventral thecal sac and minimal flattening of the right hemi cord. Ventral right C7 nerve root could be affected. No significant spinal stenosis. Foramina remain patent.  C7-T1:  Unremarkable.  MRI THORACIC SPINE FINDINGS  Alignment: Physiologic with preservation of the normal thoracic kyphosis. No listhesis.  Vertebrae: Vertebral body height maintained without evidence for acute or chronic fracture. Bone marrow signal intensity diffusely decreased on T1 weighted imaging, nonspecific, but most commonly related to anemia, smoking, or obesity. Few scattered subcentimeter benign hemangiomata noted. No worrisome osseous lesions. No abnormal marrow edema.  Cord: Signal intensity within the thoracic spinal cord is normal. Normal cord caliber morphology.  Paraspinal and other soft tissues: Unremarkable.  Disc levels:  No significant disc pathology seen within the thoracic spine. No focal disc herniation or significant disc bulge. No canal or neural foraminal stenosis. No impingement.  MRI LUMBAR SPINE FINDINGS  Segmentation: Standard. Lowest well-formed disc space labeled the L5-S1  level.  Alignment: Physiologic with preservation of the normal lumbar lordosis. No listhesis.  Vertebrae: Vertebral body height maintained without evidence for acute or chronic fracture. Bone marrow signal intensity diffusely decreased on T1 weighted imaging, nonspecific, but most commonly related to anemia, smoking or obesity. Subcentimeter benign hemangioma noted within the T12 vertebral body. No other discrete or worrisome osseous lesions. No abnormal marrow edema.  Conus medullaris and cauda equina: Conus extends to the L2 level. Conus and cauda equina appear normal.  Paraspinal and other soft tissues: Paraspinous soft tissues within normal limits. Subcentimeter simple cyst noted within the interpolar right kidney. Visualized visceral structures otherwise unremarkable.  Disc levels:  L1-2:  Unremarkable.  L2-3:  Unremarkable.  L3-4: Mild diffuse disc bulge with disc desiccation. No significant spinal stenosis. Foramina remain patent.  L4-5: Mild diffuse disc bulge with disc desiccation. Mild bilateral facet hypertrophy. No canal or lateral recess stenosis. Mild left L4 foraminal narrowing. No significant right foraminal stenosis.  L5-S1: Mild disc bulge with disc desiccation. No significant canal or lateral recess stenosis. Foramina remain patent.  IMPRESSION: MRI CERVICAL SPINE IMPRESSION:  1. No acute abnormality within the cervical spine. Normal MRI appearance of the cervical spinal cord. 2. Right eccentric disc bulge at C6-7 with secondary mild flattening of the right hemi cord. The ventral right C7 nerve root could be affected. 3. Additional mild noncompressive disc bulging at C3-4 through C5-6 without stenosis or impingement.  MRI THORACIC SPINE IMPRESSION:  Normal MRI of the thoracic spine and spinal cord. No significant disc pathology, stenosis, or evidence for neural impingement.  MRI LUMBAR SPINE IMPRESSION:  1. No acute abnormality within the lumbar  spine. 2. Mild disc bulging at L4-5 with resultant mild left L4 foraminal stenosis. 3. Additional mild noncompressive disc bulging at L3-4 and L5-S1 without stenosis or impingement. 4. Diffusely decreased T1 weighted signal intensity throughout the visualized bone marrow, nonspecific, but most commonly related to anemia, smoking, or obesity. Correlation with history and laboratory values recommended.   Electronically Signed By: Jeannine Boga M.D. On: 05/06/2019 01:19  Lumbar MR wo contrast: No valid procedures specified. Lumbar CT wo contrast: No results found for this or any previous visit.  Lumbar DG Bending views: Results for orders placed during the hospital encounter of 01/01/21  DG Lumbar Spine Complete W/Bend  Narrative CLINICAL DATA:  Low back pain  EXAM: LUMBAR SPINE - COMPLETE WITH BENDING VIEWS  COMPARISON:  08/01/2009, MRI 05/06/2019  FINDINGS: Five non rib-bearing lumbar type vertebra. Vertebral body heights are maintained. The disc spaces appear grossly patent. No suspicious change in alignment with flexion or extension. Mild narrowing of the anterior disc space at L3-L4 with flexion. Mild facet degenerative changes at L5-S1.  IMPRESSION: Mild facet degenerative changes at L5-S1.   Electronically Signed By: Donavan Foil M.D. On: 01/01/2021 21:51         Sacroiliac Joint Imaging: Sacroiliac Joint DG: Results for orders placed during the hospital encounter of 01/01/21  DG Si Joints  Narrative CLINICAL DATA:  SI joint pain  EXAM: BILATERAL SACROILIAC JOINTS - 3+ VIEW  COMPARISON:  None.  FINDINGS: The sacroiliac joint spaces are maintained and there is no evidence of arthropathy. No  other bone abnormalities are seen.  IMPRESSION: Negative.   Electronically Signed By: Donavan Foil M.D. On: 01/01/2021 21:52   Hip Imaging: Hip-R MR wo contrast: No results found for this or any previous visit.  Hip-L MR wo contrast: No results  found for this or any previous visit.  Hip-R CT wo contrast: No results found for this or any previous visit.  Hip-L CT wo contrast: No results found for this or any previous visit.  Hip-R DG 2-3 views: No results found for this or any previous visit.  Hip-L DG 2-3 views: No results found for this or any previous visit.  Hip-B DG Bilateral: No results found for this or any previous visit.   Knee Imaging: Knee-R MR wo contrast: No results found for this or any previous visit.  Knee-L MR wo contrast: No results found for this or any previous visit.  Knee-R CT wo contrast: No results found for this or any previous visit.  Knee-L CT wo contrast: No results found for this or any previous visit.  Knee-R DG 4 views: No results found for this or any previous visit.  Knee-L DG 4 views: Results for orders placed during the hospital encounter of 03/24/20  DG Knee Complete 4 Views Left  Narrative CLINICAL DATA:  Knee pain for 2 weeks, no known injury, initial encounter  EXAM: LEFT KNEE - COMPLETE 4+ VIEW  COMPARISON:  None.  FINDINGS: No acute fracture or dislocation is noted. Mild medial joint space narrowing is noted. No joint effusion is seen. No soft tissue abnormality is noted.  IMPRESSION: Mild degenerative change without acute abnormality.   Electronically Signed By: Inez Catalina M.D. On: 03/24/2020 13:31   Ankle Imaging: Ankle-R DG Complete: Results for orders placed during the hospital encounter of 03/10/16  DG Ankle Complete Right  Narrative CLINICAL DATA:  Ankle pain and swelling  EXAM: RIGHT ANKLE - COMPLETE 3+ VIEW  COMPARISON:  06/02/2016  FINDINGS: There is no evidence of fracture, dislocation, or joint effusion. There is no evidence of arthropathy or other focal bone abnormality. Soft tissues are unremarkable.  IMPRESSION: Negative.   Electronically Signed By: Kerby Moors M.D. On: 03/10/2016 13:28  Ankle-L DG Complete: Results for  orders placed during the hospital encounter of 06/03/14  DG Ankle Complete Left  Narrative CLINICAL DATA:  Fall.  Left ankle pain.  EXAM: LEFT ANKLE COMPLETE - 3+ VIEW  COMPARISON:  01/30/2014  FINDINGS: There is marked lateral soft tissue swelling. There is an spiral fracture involving the distal fibula. The fracture fragments are in near anatomic alignment. No additional fractures or subluxations identified.  IMPRESSION: 1. Spiral fracture involves the distal fibula. 2. Soft tissue swelling.   Electronically Signed By: Kerby Moors M.D. On: 06/03/2014 13:47   Foot Imaging: Foot-R DG Complete: No results found for this or any previous visit.  Foot-L DG Complete: No results found for this or any previous visit.   Elbow Imaging: Elbow-R DG Complete: No results found for this or any previous visit.  Elbow-L DG Complete: No results found for this or any previous visit.   Wrist Imaging: Wrist-R DG Complete: No results found for this or any previous visit.  Wrist-L DG Complete: No results found for this or any previous visit.   Hand Imaging: Hand-R DG Complete: No results found for this or any previous visit.  Hand-L DG Complete: No results found for this or any previous visit.   Complexity Note: Imaging results reviewed.  Meds   Current Outpatient Medications:    albuterol (PROVENTIL HFA;VENTOLIN HFA) 108 (90 Base) MCG/ACT inhaler, Inhale 2 puffs into the lungs every 6 (six) hours as needed for wheezing or shortness of breath., Disp: 1 Inhaler, Rfl: 2   amLODipine (NORVASC) 5 MG tablet, TAKE 1 TABLET(5 MG) BY MOUTH DAILY, Disp: 30 tablet, Rfl: 0   cholestyramine light (PREVALITE) 4 g packet, Take 1 packet (4 g total) by mouth 2 (two) times daily as needed., Disp: 28 packet, Rfl: 0   gabapentin (NEURONTIN) 300 MG capsule, Take 300 mg by mouth at bedtime., Disp: , Rfl:    naproxen (NAPROSYN) 500 MG tablet, Take 1 tablet (500 mg total)  by mouth 2 (two) times daily with a meal., Disp: 30 tablet, Rfl: 0   tiZANidine (ZANAFLEX) 4 MG tablet, Take 1 tablet (4 mg total) by mouth 3 (three) times daily., Disp: 30 tablet, Rfl: 0   vitamin B-12 1000 MCG tablet, Take 1 tablet (1,000 mcg total) by mouth daily., Disp: 30 tablet, Rfl: 0  ROS  Constitutional: Denies any fever or chills Gastrointestinal: No reported hemesis, hematochezia, vomiting, or acute GI distress Musculoskeletal: Denies any acute onset joint swelling, redness, loss of ROM, or weakness Neurological: No reported episodes of acute onset apraxia, aphasia, dysarthria, agnosia, amnesia, paralysis, loss of coordination, or loss of consciousness  Allergies  Ms. Reach is allergic to ace inhibitors.  PFSH  Drug: Ms. Sgroi  reports current drug use. Frequency: 1.00 time per week. Drug: Marijuana. Alcohol:  reports current alcohol use. Tobacco:  reports that she has been smoking cigarettes. She has a 6.00 pack-year smoking history. She has never used smokeless tobacco. Medical:  has a past medical history of Allergy, Anemia, Asthma, Cancer ([place]), Depression, GERD (gastroesophageal reflux disease), Headache, Hypertension, Pancreatitis, Pilonidal cyst, and Pneumonia (2018). Surgical: Ms. Castello  has a past surgical history that includes Tubal ligation; epigastric hernia repair (N/A, 04/18/2015); Hernia repair; Cholecystectomy (10/21/2015); ERCP (N/A, 12/21/2016); Cyst excision; Cholecystectomy (Bilateral); Cervical conization w/bx (N/A, 03/23/2017); Radical hysterectomy (04/14/2017); Salpingectomy (Bilateral, 04/14/2017); Sentinel lymph node biopsy; Abdominal hysterectomy; Lysis of adhesion (N/A, 01/01/2020); Laparoscopic salpingo oophorectomy (Right, 01/01/2020); Colonoscopy with propofol (N/A, 12/06/2020); Esophagogastroduodenoscopy (12/06/2020); Givens capsule study (N/A, 12/08/2020); and VISCERAL ANGIOGRAPHY (N/A, 12/11/2020). Family: family history includes Cancer in her maternal  grandfather, maternal grandmother, paternal grandfather, paternal grandmother, and paternal uncle; Gallstones in her mother; Gout in her father; Hypertension in her father and mother.  Constitutional Exam  General appearance: Well nourished, well developed, and well hydrated. In no apparent acute distress There were no vitals filed for this visit. BMI Assessment: Estimated body mass index is 38.27 kg/m as calculated from the following:   Height as of 08/28/21: $RemoveBe'5\' 5"'[id number]$  (1.651 m).   Weight as of 08/28/21: 230 lb (104.3 kg).  BMI interpretation table: BMI level Category Range association with higher incidence of chronic pain  <18 kg/m2 Underweight   18.5-24.9 kg/m2 Ideal body weight   25-29.9 kg/m2 Overweight Increased incidence by 20%  30-34.9 kg/m2 Obese (Class I) Increased incidence by 68%  35-39.9 kg/m2 Severe obesity (Class II) Increased incidence by 136%  >40 kg/m2 Extreme obesity (Class III) Increased incidence by 254%   Patient's current BMI Ideal Body weight  There is no height or weight on file to calculate BMI. Ideal body weight: 57 kg (125 lb 10.6 oz) Adjusted ideal body weight: 75.9 kg (167 lb 6.4 oz)   BMI Readings from Last 4 Encounters:  08/28/21 38.27 kg/m  03/30/21 38.44 kg/m  02/12/21 38.41 kg/m  12/29/20 36.61 kg/m   Wt Readings from Last 4 Encounters:  08/28/21 230 lb (104.3 kg)  03/30/21 231 lb (104.8 kg)  02/12/21 230 lb 12.8 oz (104.7 kg)  12/29/20 220 lb (99.8 kg)    Psych/Mental status: Alert, oriented x 3 (person, place, & time)       Eyes: PERLA Respiratory: No evidence of acute respiratory distress  Assessment & Plan  Primary Diagnosis & Pertinent Problem List: There were no encounter diagnoses.  Visit Diagnosis: No diagnosis found. Problems updated and reviewed during this visit: No problems updated.  Plan of Care  Pharmacotherapy (Medications Ordered): No orders of the defined types were placed in this encounter.  Procedure Orders    No  procedure(s) ordered today   Lab Orders  No laboratory test(s) ordered today   Imaging Orders  No imaging studies ordered today   Referral Orders  No referral(s) requested today    Pharmacological management options:  Opioid Analgesics: I will not be prescribing any opioids at this time Membrane stabilizer: I will not be prescribing any at this time Muscle relaxant: I will not be prescribing any at this time NSAID: I will not be prescribing any at this time Other analgesic(s): I will not be prescribing any at this time     Interventional Therapies  Risk  Complexity Considerations:   Estimated body mass index is 36.61 kg/m as calculated from the following:   Height as of this encounter: $RemoveBeforeD'5\' 5"'[id number]$  (1.651 m).   Weight as of this encounter: 220 lb (99.8 kg). WNL   Planned  Pending:   Diagnostic bilateral lumbar facet MBB #1    Under consideration:   Diagnostic bilateral lumbar facet MBB #1  Diagnostic bilateral sacroiliac joint block #1    Completed by Girtha Hake, MD [facility] PMR):   Diagnostic/therapeutic bilateral L5 TFESI x2 (06/04/2019 & 08/02/2019) (no benefit & painful)    Completed:   None at this time   Therapeutic  Palliative (PRN) options:   None established     Provider-requested follow-up: No follow-ups on file. Recent Visits No visits were found meeting these conditions. Showing recent visits within past 90 days and meeting all other requirements Future Appointments Date Type Provider Dept  09/09/21 Appointment Milinda Pointer, MD [facility]  Showing future appointments within next 90 days and meeting all other requirements  Primary Care Physician: [facility] [place] Note by: Gaspar Cola, MD Date: 09/09/2021; Time: 6:04 PM

## 2021-09-09 ENCOUNTER — Ambulatory Visit (HOSPITAL_BASED_OUTPATIENT_CLINIC_OR_DEPARTMENT_OTHER): Payer: Medicaid Other | Admitting: Pain Medicine

## 2021-09-09 DIAGNOSIS — Z91199 Patient's noncompliance with other medical treatment and regimen due to unspecified reason: Secondary | ICD-10-CM

## 2021-10-27 NOTE — Progress Notes (Unsigned)
PROVIDER NOTE: Information contained herein reflects review and annotations entered in association with encounter. Interpretation of such information and data should be left to medically-trained personnel. Information provided to patient can be located elsewhere in the medical record under "Patient Instructions". Document created using STT-dictation technology, any transcriptional errors that may result from process are unintentional.    Patient: Cassidy Mathis  Service Category: E/M  Provider: Gaspar Cola, MD  DOB: 05-13-1975  DOS: 10/28/2021  Referring Provider: Center, Jenny Reichmann*  MRN: [id number]  Specialty: Interventional Pain Management  PCP: [facility], [place]  Type: Established Patient  Setting: Ambulatory outpatient    Location: Office  Delivery: Face-to-face     Primary Reason(s) for Visit: Encounter for evaluation before starting new chronic pain management plan of care (Level of risk: moderate) CC: No chief complaint on file.  HPI  Cassidy Mathis is a 46 y.o. year old, female patient, who comes today for a follow-up evaluation to review the test results and decide on a treatment plan. 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[place]); Cervical cancer, FIGO stage IB1 ([place]); Ileus ([place]); Tobacco use; Postoperative anemia due to acute blood loss; Right ovarian cyst; Precordial pain; Palpitations; Heart murmur; Uncontrolled hypertension; Rectal bleeding; Vitamin B12 deficiency; Acute blood loss anemia; Hypokalemia; Acute bilateral low back pain with bilateral sciatica; Foraminal stenosis of lumbar region; Malignant neoplasm of overlapping sites of cervix ([place]); Chronic pain syndrome; Pharmacologic therapy; Disorder of skeletal system; Problems influencing health status; History of marijuana use; Abnormal MRI, cervical spine  (05/06/2019); Abnormal MRI, thoracic spine (05/06/2019); Abnormal MRI, lumbar spine (05/06/2019); GERD (gastroesophageal reflux disease); NSAID induced gastritis; Chronic low back pain (1ry area of Pain) (Bilateral) (L>R) w/o sciatica; Chronic lower extremity pain (2ry area of Pain) (Bilateral) (L>R); Chronic sacroiliac joint pain (Bilateral); Lumbosacral facet syndrome (Bilateral); Lumbar facet hypertrophy (L4-5) (Bilateral); Lumbosacral facet arthropathy; Somatic dysfunction of sacroiliac joints (Bilateral); Elevated sed rate; Vitamin D deficiency; Marijuana use; and Abnormal drug screen (12/29/2020) on their problem list. Her primarily concern today is the No chief complaint on file.  Pain Assessment: Location:     Radiating:   Onset:   Duration:   Quality:   Severity:  /10 (subjective, self-reported pain score)  Effect on ADL:   Timing:   Modifying factors:   BP:    HR:    Cassidy Mathis comes in today for a follow-up visit after her initial evaluation on 09/09/2021. Today we went over the results of her tests. These were explained in "Layman's terms". During today's appointment we went over my diagnostic impression, as well as the proposed treatment plan.  Review of initial evaluation (12/29/2020): NO-SHOW to follow-up second visit (09/09/2021). "According to the patient the primary area of pain is that of the lower back (Bilateral) (L>R).  The patient denies any prior surgeries, physical therapy, but she does admit to having had some MRIs in the past.  The patient also indicates having had some injections done at the [facility] in [place].  Review of the "care everywhere" section of the chart indicate that on 06/04/2019 and 08/02/2019 she had procedures done by Dr. Girtha Hake, MD [facility])  Procedures done by Dr. Girtha Hake: 1. bilateral L5 TFESI x2 (06/04/2019 & 08/02/2019) (no benefit and described as painful)   The patient's second area of pain is that of the lower  extremities (Bilateral) (L>R).  The  patient denies any surgeries, physical therapy, injection therapies, but she thinks that she had some x-rays done.  In the case of the left lower extremity the pain runs through the back of the leg all the way down to the ankle but never into the foot.  The patient did describe some tingling sensation on the bottom of her foot which seems to be intermittent.  In the case of the right lower extremity, the pattern is exactly the same, but just not as intense.  The patient's third area pain is that of the left ankle.  She denies any surgeries, physical therapy, x-rays, or any type of nerve blocks or joint injections.  Physical exam: The patient was able to toe walk and heel walk without any problems.  She also presented with negative bilateral straight leg raise.  Hyperextension and rotation maneuver of the lumbar spine and Kemp maneuver were positive bilaterally for lumbar facet arthralgia.  Patrick maneuver was positive bilaterally for sacroiliac joint arthralgia but no hip joint pathology.  Since the patient denied having had any physical therapy, we will start by referring her for physical therapy of the lumbar spine.  The patient also denied having had an oral steroid trial, but she also pointed out that she was recently released from the hospital secondary to a GI bleed from the use of NSAIDs.  She indicated having an appointment to see a gastroenterologist in approximately 2 weeks, but she was not given any medications for her GERD, according to her.  For this reason, today we will give her a 2-week prescription for Nexium 40 mg p.o. daily.  The patient was reminded that any further prescriptions for that kind of medication will need to come from either her primary care physician or her gastroenterologist.  In addition to the above, we have offered the patient a diagnostic bilateral lumbar facet block to determine if our suspicions are correct regarding the etiology of  her pain.  She indicated previously having had injections done by Dr. Alba Destine which did not work and because of this, she is not crazy about having any interventional procedures.  I have reminded the patient that this is what I can offer her and if she feels that this is not something that she wants to do, she just needs to let us know.  She decided to proceed with it."  ***  Because interventional pain management is my board-certified specialty, the patient was informed that joining my practice means that he is open to any and all interventional therapies. I made it clear that this does not mean that they will be forced to have any procedures done. What this means is that I believe interventional therapies to be essential part of the diagnosis and proper management of chronic pain conditions. Therefore, patients not interested in these interventional alternatives will be better served under the care of a different practitioner.  In considering the treatment plan options, Cassidy Mathis was reminded that I no longer take patients for medication management only. I asked her to let me know if she had no intention of taking advantage of the interventional therapies, so that we could make arrangements to provide this space to someone interested. I also made it clear that undergoing interventional therapies for the purpose of getting pain medications is very inappropriate on the part of a patient, and it will not be tolerated in this practice. This type of behavior would suggest true addiction and therefore it requires referral to an addiction specialist.  Further details on both, my assessment(s), as well as the proposed treatment plan, please see below.  Controlled Substance Pharmacotherapy Assessment REMS (Risk Evaluation and Mitigation Strategy)  Opioid Analgesic: Hydrocodone/APAP 5/325, 1 tab p.o. 4 times daily (#12) (last filled 08/28/2021) MME/day: 20 mg/day  Pill Count: None expected due to no prior  prescriptions written by our practice. No notes on file Pharmacokinetics: Liberation and absorption (onset of action): WNL Distribution (time to peak effect): WNL Metabolism and excretion (duration of action): WNL         Pharmacodynamics: Desired effects: Analgesia: Cassidy Mathis reports >50% benefit. Functional ability: Patient reports that medication allows her to accomplish basic ADLs Clinically meaningful improvement in function (CMIF): Sustained CMIF goals met Perceived effectiveness: Described as relatively effective, allowing for increase in activities of daily living (ADL) Undesirable effects: Side-effects or Adverse reactions: None reported Monitoring: [place] PMP: PDMP reviewed during this encounter. Online review of the past 73-month period previously conducted. Not applicable at this point since we have not taken over the patient's medication management yet. List of other Serum/Urine Drug Screening Test(s):  Lab Results  Component Value Date   COCAINSCRNUR NONE DETECTED 01/01/2020   COCAINSCRNUR NONE DETECTED 03/23/2017   COCAINSCRNUR NONE DETECTED 10/21/2015   COCAINSCRNUR NONE DETECTED 04/18/2015   THCU POSITIVE (A) 01/01/2020   THCU POSITIVE (A) 03/23/2017   THCU POSITIVE (A) 10/21/2015   THCU POSITIVE (A) 04/18/2015   List of all UDS test(s) done:  Lab Results  Component Value Date   SUMMARY Note 12/29/2020   Last UDS on record: Summary  Date Value Ref Range Status  12/29/2020 Note  Final    Comment:    ==================================================================== Compliance Drug Analysis, Ur ==================================================================== Test                             Result       Flag       Units  Drug Present and Declared for Prescription Verification   Butalbital                     PRESENT      EXPECTED   Gabapentin                     PRESENT      EXPECTED  Drug Present not Declared for Prescription Verification    Carboxy-THC                    96           UNEXPECTED ng/mg creat    Carboxy-THC is a metabolite of tetrahydrocannabinol (THC). Source of    THC is most commonly herbal marijuana or marijuana-based products,    but THC is also present in a scheduled prescription medication.    Trace amounts of THC can be present in hemp and cannabidiol (CBD)    products. This test is not intended to distinguish between delta-9-    tetrahydrocannabinol, the predominant form of THC in most herbal or    marijuana-based products, and delta-8-tetrahydrocannabinol.  Drug Absent but Declared for Prescription Verification   Acetaminophen                  Not Detected UNEXPECTED    Acetaminophen, as indicated in the declared medication list, is not    always detected even when used as directed.  ==================================================================== Test  Result    Flag   Units      Ref Range   Creatinine              54               mg/dL      >=20 ==================================================================== Declared Medications:  The flagging and interpretation on this report are based on the  following declared medications.  Unexpected results may arise from  inaccuracies in the declared medications.   **Note: The testing scope of this panel includes these medications:   Butalbital (Fioricet)  Gabapentin (Neurontin)   **Note: The testing scope of this panel does not include small to  moderate amounts of these reported medications:   Acetaminophen (Fioricet)   **Note: The testing scope of this panel does not include the  following reported medications:   Albuterol (Ventolin HFA)  Amlodipine (Norvasc)  Caffeine (Fioricet)  Cyanocobalamin  Esomeprazole (Nexium)  Lisinopril (Zestril) ==================================================================== For clinical consultation, please call [phone]. ====================================================================    UDS interpretation: No unexpected findings.          Medication Assessment Form: Not applicable. No opioids. Treatment compliance: Not applicable Risk Assessment Profile: Aberrant behavior: See initial evaluations. None observed or detected today Comorbid factors increasing risk of overdose: See initial evaluation. No additional risks detected today Opioid risk tool (ORT):     12/29/2020   12:38 PM  Opioid Risk   Alcohol 0  Illegal Drugs 0  Rx Drugs 0  Alcohol 0  Illegal Drugs 0  Rx Drugs 0  Age between 16-45 years  1  History of Preadolescent Sexual Abuse 0  Psychological Disease 0  Depression 1  Opioid Risk Tool Scoring 2  Opioid Risk Interpretation Low Risk    ORT Scoring interpretation table:  Score <3 = Low Risk for SUD  Score between 4-7 = Moderate Risk for SUD  Score >8 = High Risk for Opioid Abuse   Risk of substance use disorder (SUD): Low  Risk Mitigation Strategies:  Patient opioid safety counseling: No controlled substances prescribed. Patient-Prescriber Agreement (PPA): No agreement signed.  Controlled substance notification to other providers: None required. No opioid therapy.  Pharmacologic Plan: Non-opioid analgesic therapy offered. Interventional alternatives discussed.             Laboratory Chemistry Profile   Renal Lab Results  Component Value Date   BUN 7 12/12/2020   CREATININE 0.84 12/12/2020   BCR 8 (L) 11/28/2020   GFRAA >60 05/05/2019   GFRNONAA >60 12/12/2020   PROTEINUR NEGATIVE 12/02/2019     Electrolytes Lab Results  Component Value Date   NA 138 12/12/2020   K 3.6 12/12/2020   CL 107 12/12/2020   CALCIUM 8.3 (L) 12/12/2020   MG 2.2 12/29/2020   PHOS 3.4 12/08/2020     Hepatic Lab Results  Component Value Date   AST 18 12/04/2020   ALT 19 12/04/2020   ALBUMIN 3.6 12/04/2020   ALKPHOS 59 12/04/2020   LIPASE 87 (H) 12/04/2020      ID Lab Results  Component Value Date   HIV Non Reactive 12/04/2020   SARSCOV2NAA NEGATIVE 12/04/2020   HCVAB 0.1 12/16/2016   PREGTESTUR NEGATIVE 03/23/2017     Bone Lab Results  Component Value Date   25OHVITD1 8.9 (L) 12/29/2020   25OHVITD2 <1.0 12/29/2020   25OHVITD3 8.9 12/29/2020     Endocrine Lab Results  Component Value Date   GLUCOSE 109 (H) 12/12/2020   GLUCOSEU NEGATIVE 12/02/2019  HGBA1C 5.5 12/15/2016   TSH 0.683 12/15/2016     Neuropathy Lab Results  Component Value Date   VITAMINB12 1,812 (H) 12/10/2020   FOLATE >24.8 12/05/2020   HGBA1C 5.5 12/15/2016   HIV Non Reactive 12/04/2020     CNS No results found for: "COLORCSF", "APPEARCSF", "RBCCOUNTCSF", "WBCCSF", "POLYSCSF", "LYMPHSCSF", "EOSCSF", "PROTEINCSF", "GLUCCSF", "JCVIRUS", "CSFOLI", "IGGCSF", "LABACHR", "ACETBL"   Inflammation (CRP: Acute  ESR: Chronic) Lab Results  Component Value Date   CRP 8 12/29/2020   ESRSEDRATE 62 (H) 12/29/2020     Rheumatology Lab Results  Component Value Date   LABURIC 5.4 03/24/2020     Coagulation Lab Results  Component Value Date   INR 1.0 12/04/2020   LABPROT 13.5 12/04/2020   APTT 28 03/22/2017   PLT 432 02/12/2021     Cardiovascular Lab Results  Component Value Date   CKTOTAL 191 (H) 01/04/2018   CKMB < 0.5 (L) 02/03/2012   TROPONINI <0.03 10/04/2015   HGB 12.3 02/12/2021   HCT 36.8 02/12/2021     Screening Lab Results  Component Value Date   SARSCOV2NAA NEGATIVE 12/04/2020   HCVAB 0.1 12/16/2016   HIV Non Reactive 12/04/2020   PREGTESTUR NEGATIVE 03/23/2017     Cancer No results found for: "CEA", "CA125", "LABCA2"   Allergens No results found for: "ALMOND", "APPLE", "ASPARAGUS", "AVOCADO", "BANANA", "BARLEY", "BASIL", "BAYLEAF", "GREENBEAN", "LIMABEAN", "WHITEBEAN", "BEEFIGE", "REDBEET", "BLUEBERRY", "BROCCOLI", "CABBAGE", "MELON", "CARROT", "CASEIN", "CASHEWNUT", "CAULIFLOWER", "CELERY"     Note: Lab results reviewed.  Recent  Diagnostic Imaging Review  Cervical Imaging: Cervical MR wo contrast: Results for orders placed during the hospital encounter of 05/05/19  MR Cervical Spine Wo Contrast  Narrative CLINICAL DATA:  Initial evaluation for acute right upper extremity numbness, paresthesias.  EXAM: MRI CERVICAL, THORACIC AND LUMBAR SPINE WITHOUT CONTRAST  TECHNIQUE: Multiplanar and multiecho pulse sequences of the cervical spine, to include the craniocervical junction and cervicothoracic junction, and thoracic and lumbar spine, were obtained without intravenous contrast.  COMPARISON:  None.  FINDINGS: MRI CERVICAL SPINE FINDINGS  Alignment: Reversal of the normal cervical lordosis without listhesis.  Vertebrae: Vertebral body height maintained without evidence for acute or chronic fracture. Bone marrow signal intensity diffusely decreased on T1 weighted imaging, nonspecific, but most commonly related to anemia, smoking, or obesity. No discrete or worrisome osseous lesions. No abnormal marrow edema.  Cord: Signal intensity within the cervical spinal cord is normal.  Posterior Fossa, vertebral arteries, paraspinal tissues: Visualized brain and posterior fossa within normal limits. Craniocervical junction normal. Paraspinous and prevertebral soft tissues within normal limits. Normal intravascular flow voids seen within the vertebral arteries bilaterally.  Disc levels:  C2-C3: Unremarkable.  C3-C4: Mild disc bulge. Superimposed tiny central disc protrusion minimally indents the ventral thecal sac (series 4, image 9). No significant spinal stenosis or cord deformity. Foramina remain patent.  C4-C5: Mild diffuse disc bulge. Mild flattening of the ventral thecal sac without significant spinal stenosis or cord deformity. Foramina remain patent.  C5-C6: Mild diffuse disc bulge. Associated small central annular fissure. Flattening of the ventral thecal sac without significant spinal stenosis  or cord deformity. Foramina remain patent.  C6-C7: Mild diffuse disc bulge with bilateral uncovertebral hypertrophy. Disc bulge slightly asymmetric to the right with flattening of the right ventral thecal sac and minimal flattening of the right hemi cord. Ventral right C7 nerve root could be affected. No significant spinal stenosis. Foramina remain patent.  C7-T1:  Unremarkable.  MRI THORACIC SPINE FINDINGS  Alignment: Physiologic with preservation of the normal  thoracic kyphosis. No listhesis.  Vertebrae: Vertebral body height maintained without evidence for acute or chronic fracture. Bone marrow signal intensity diffusely decreased on T1 weighted imaging, nonspecific, but most commonly related to anemia, smoking, or obesity. Few scattered subcentimeter benign hemangiomata noted. No worrisome osseous lesions. No abnormal marrow edema.  Cord: Signal intensity within the thoracic spinal cord is normal. Normal cord caliber morphology.  Paraspinal and other soft tissues: Unremarkable.  Disc levels:  No significant disc pathology seen within the thoracic spine. No focal disc herniation or significant disc bulge. No canal or neural foraminal stenosis. No impingement.  MRI LUMBAR SPINE FINDINGS  Segmentation: Standard. Lowest well-formed disc space labeled the L5-S1 level.  Alignment: Physiologic with preservation of the normal lumbar lordosis. No listhesis.  Vertebrae: Vertebral body height maintained without evidence for acute or chronic fracture. Bone marrow signal intensity diffusely decreased on T1 weighted imaging, nonspecific, but most commonly related to anemia, smoking or obesity. Subcentimeter benign hemangioma noted within the T12 vertebral body. No other discrete or worrisome osseous lesions. No abnormal marrow edema.  Conus medullaris and cauda equina: Conus extends to the L2 level. Conus and cauda equina appear normal.  Paraspinal and other soft tissues:  Paraspinous soft tissues within normal limits. Subcentimeter simple cyst noted within the interpolar right kidney. Visualized visceral structures otherwise unremarkable.  Disc levels:  L1-2:  Unremarkable.  L2-3:  Unremarkable.  L3-4: Mild diffuse disc bulge with disc desiccation. No significant spinal stenosis. Foramina remain patent.  L4-5: Mild diffuse disc bulge with disc desiccation. Mild bilateral facet hypertrophy. No canal or lateral recess stenosis. Mild left L4 foraminal narrowing. No significant right foraminal stenosis.  L5-S1: Mild disc bulge with disc desiccation. No significant canal or lateral recess stenosis. Foramina remain patent.  IMPRESSION: MRI CERVICAL SPINE IMPRESSION:  1. No acute abnormality within the cervical spine. Normal MRI appearance of the cervical spinal cord. 2. Right eccentric disc bulge at C6-7 with secondary mild flattening of the right hemi cord. The ventral right C7 nerve root could be affected. 3. Additional mild noncompressive disc bulging at C3-4 through C5-6 without stenosis or impingement.  MRI THORACIC SPINE IMPRESSION:  Normal MRI of the thoracic spine and spinal cord. No significant disc pathology, stenosis, or evidence for neural impingement.  MRI LUMBAR SPINE IMPRESSION:  1. No acute abnormality within the lumbar spine. 2. Mild disc bulging at L4-5 with resultant mild left L4 foraminal stenosis. 3. Additional mild noncompressive disc bulging at L3-4 and L5-S1 without stenosis or impingement. 4. Diffusely decreased T1 weighted signal intensity throughout the visualized bone marrow, nonspecific, but most commonly related to anemia, smoking, or obesity. Correlation with history and laboratory values recommended.   Electronically Signed By: Jeannine Boga M.D. On: 05/06/2019 01:19  Cervical MR wo contrast: No valid procedures specified. Cervical CT wo contrast: No results found for this or any previous  visit.  Cervical DG Bending/F/E views: No results found for this or any previous visit.   Shoulder Imaging: Shoulder-R MR wo contrast: No results found for this or any previous visit.  Shoulder-L MR wo contrast: No results found for this or any previous visit.  Shoulder-R DG: No results found for this or any previous visit.  Shoulder-L DG: No results found for this or any previous visit.   Thoracic Imaging: Thoracic MR wo contrast: Results for orders placed during the hospital encounter of 05/05/19  MR THORACIC SPINE WO CONTRAST  Narrative CLINICAL DATA:  Initial evaluation for acute right upper extremity numbness,  paresthesias.  EXAM: MRI CERVICAL, THORACIC AND LUMBAR SPINE WITHOUT CONTRAST  TECHNIQUE: Multiplanar and multiecho pulse sequences of the cervical spine, to include the craniocervical junction and cervicothoracic junction, and thoracic and lumbar spine, were obtained without intravenous contrast.  COMPARISON:  None.  FINDINGS: MRI CERVICAL SPINE FINDINGS  Alignment: Reversal of the normal cervical lordosis without listhesis.  Vertebrae: Vertebral body height maintained without evidence for acute or chronic fracture. Bone marrow signal intensity diffusely decreased on T1 weighted imaging, nonspecific, but most commonly related to anemia, smoking, or obesity. No discrete or worrisome osseous lesions. No abnormal marrow edema.  Cord: Signal intensity within the cervical spinal cord is normal.  Posterior Fossa, vertebral arteries, paraspinal tissues: Visualized brain and posterior fossa within normal limits. Craniocervical junction normal. Paraspinous and prevertebral soft tissues within normal limits. Normal intravascular flow voids seen within the vertebral arteries bilaterally.  Disc levels:  C2-C3: Unremarkable.  C3-C4: Mild disc bulge. Superimposed tiny central disc protrusion minimally indents the ventral thecal sac (series 4, image 9).  No significant spinal stenosis or cord deformity. Foramina remain patent.  C4-C5: Mild diffuse disc bulge. Mild flattening of the ventral thecal sac without significant spinal stenosis or cord deformity. Foramina remain patent.  C5-C6: Mild diffuse disc bulge. Associated small central annular fissure. Flattening of the ventral thecal sac without significant spinal stenosis or cord deformity. Foramina remain patent.  C6-C7: Mild diffuse disc bulge with bilateral uncovertebral hypertrophy. Disc bulge slightly asymmetric to the right with flattening of the right ventral thecal sac and minimal flattening of the right hemi cord. Ventral right C7 nerve root could be affected. No significant spinal stenosis. Foramina remain patent.  C7-T1:  Unremarkable.  MRI THORACIC SPINE FINDINGS  Alignment: Physiologic with preservation of the normal thoracic kyphosis. No listhesis.  Vertebrae: Vertebral body height maintained without evidence for acute or chronic fracture. Bone marrow signal intensity diffusely decreased on T1 weighted imaging, nonspecific, but most commonly related to anemia, smoking, or obesity. Few scattered subcentimeter benign hemangiomata noted. No worrisome osseous lesions. No abnormal marrow edema.  Cord: Signal intensity within the thoracic spinal cord is normal. Normal cord caliber morphology.  Paraspinal and other soft tissues: Unremarkable.  Disc levels:  No significant disc pathology seen within the thoracic spine. No focal disc herniation or significant disc bulge. No canal or neural foraminal stenosis. No impingement.  MRI LUMBAR SPINE FINDINGS  Segmentation: Standard. Lowest well-formed disc space labeled the L5-S1 level.  Alignment: Physiologic with preservation of the normal lumbar lordosis. No listhesis.  Vertebrae: Vertebral body height maintained without evidence for acute or chronic fracture. Bone marrow signal intensity diffusely decreased on  T1 weighted imaging, nonspecific, but most commonly related to anemia, smoking or obesity. Subcentimeter benign hemangioma noted within the T12 vertebral body. No other discrete or worrisome osseous lesions. No abnormal marrow edema.  Conus medullaris and cauda equina: Conus extends to the L2 level. Conus and cauda equina appear normal.  Paraspinal and other soft tissues: Paraspinous soft tissues within normal limits. Subcentimeter simple cyst noted within the interpolar right kidney. Visualized visceral structures otherwise unremarkable.  Disc levels:  L1-2:  Unremarkable.  L2-3:  Unremarkable.  L3-4: Mild diffuse disc bulge with disc desiccation. No significant spinal stenosis. Foramina remain patent.  L4-5: Mild diffuse disc bulge with disc desiccation. Mild bilateral facet hypertrophy. No canal or lateral recess stenosis. Mild left L4 foraminal narrowing. No significant right foraminal stenosis.  L5-S1: Mild disc bulge with disc desiccation. No significant canal or lateral recess  stenosis. Foramina remain patent.  IMPRESSION: MRI CERVICAL SPINE IMPRESSION:  1. No acute abnormality within the cervical spine. Normal MRI appearance of the cervical spinal cord. 2. Right eccentric disc bulge at C6-7 with secondary mild flattening of the right hemi cord. The ventral right C7 nerve root could be affected. 3. Additional mild noncompressive disc bulging at C3-4 through C5-6 without stenosis or impingement.  MRI THORACIC SPINE IMPRESSION:  Normal MRI of the thoracic spine and spinal cord. No significant disc pathology, stenosis, or evidence for neural impingement.  MRI LUMBAR SPINE IMPRESSION:  1. No acute abnormality within the lumbar spine. 2. Mild disc bulging at L4-5 with resultant mild left L4 foraminal stenosis. 3. Additional mild noncompressive disc bulging at L3-4 and L5-S1 without stenosis or impingement. 4. Diffusely decreased T1 weighted signal intensity  throughout the visualized bone marrow, nonspecific, but most commonly related to anemia, smoking, or obesity. Correlation with history and laboratory values recommended.   Electronically Signed By: Jeannine Boga M.D. On: 05/06/2019 01:19  Thoracic MR wo contrast: No valid procedures specified. Thoracic CT wo contrast: No results found for this or any previous visit.  Thoracic DG 4 views: No results found for this or any previous visit.  Thoracic DG w/swimmers view: No results found for this or any previous visit.   Lumbosacral Imaging: Lumbar MR wo contrast: Results for orders placed during the hospital encounter of 05/05/19  MR LUMBAR SPINE WO CONTRAST  Narrative CLINICAL DATA:  Initial evaluation for acute right upper extremity numbness, paresthesias.  EXAM: MRI CERVICAL, THORACIC AND LUMBAR SPINE WITHOUT CONTRAST  TECHNIQUE: Multiplanar and multiecho pulse sequences of the cervical spine, to include the craniocervical junction and cervicothoracic junction, and thoracic and lumbar spine, were obtained without intravenous contrast.  COMPARISON:  None.  FINDINGS: MRI CERVICAL SPINE FINDINGS  Alignment: Reversal of the normal cervical lordosis without listhesis.  Vertebrae: Vertebral body height maintained without evidence for acute or chronic fracture. Bone marrow signal intensity diffusely decreased on T1 weighted imaging, nonspecific, but most commonly related to anemia, smoking, or obesity. No discrete or worrisome osseous lesions. No abnormal marrow edema.  Cord: Signal intensity within the cervical spinal cord is normal.  Posterior Fossa, vertebral arteries, paraspinal tissues: Visualized brain and posterior fossa within normal limits. Craniocervical junction normal. Paraspinous and prevertebral soft tissues within normal limits. Normal intravascular flow voids seen within the vertebral arteries bilaterally.  Disc levels:  C2-C3:  Unremarkable.  C3-C4: Mild disc bulge. Superimposed tiny central disc protrusion minimally indents the ventral thecal sac (series 4, image 9). No significant spinal stenosis or cord deformity. Foramina remain patent.  C4-C5: Mild diffuse disc bulge. Mild flattening of the ventral thecal sac without significant spinal stenosis or cord deformity. Foramina remain patent.  C5-C6: Mild diffuse disc bulge. Associated small central annular fissure. Flattening of the ventral thecal sac without significant spinal stenosis or cord deformity. Foramina remain patent.  C6-C7: Mild diffuse disc bulge with bilateral uncovertebral hypertrophy. Disc bulge slightly asymmetric to the right with flattening of the right ventral thecal sac and minimal flattening of the right hemi cord. Ventral right C7 nerve root could be affected. No significant spinal stenosis. Foramina remain patent.  C7-T1:  Unremarkable.  MRI THORACIC SPINE FINDINGS  Alignment: Physiologic with preservation of the normal thoracic kyphosis. No listhesis.  Vertebrae: Vertebral body height maintained without evidence for acute or chronic fracture. Bone marrow signal intensity diffusely decreased on T1 weighted imaging, nonspecific, but most commonly related to anemia, smoking, or obesity.  Few scattered subcentimeter benign hemangiomata noted. No worrisome osseous lesions. No abnormal marrow edema.  Cord: Signal intensity within the thoracic spinal cord is normal. Normal cord caliber morphology.  Paraspinal and other soft tissues: Unremarkable.  Disc levels:  No significant disc pathology seen within the thoracic spine. No focal disc herniation or significant disc bulge. No canal or neural foraminal stenosis. No impingement.  MRI LUMBAR SPINE FINDINGS  Segmentation: Standard. Lowest well-formed disc space labeled the L5-S1 level.  Alignment: Physiologic with preservation of the normal lumbar lordosis. No  listhesis.  Vertebrae: Vertebral body height maintained without evidence for acute or chronic fracture. Bone marrow signal intensity diffusely decreased on T1 weighted imaging, nonspecific, but most commonly related to anemia, smoking or obesity. Subcentimeter benign hemangioma noted within the T12 vertebral body. No other discrete or worrisome osseous lesions. No abnormal marrow edema.  Conus medullaris and cauda equina: Conus extends to the L2 level. Conus and cauda equina appear normal.  Paraspinal and other soft tissues: Paraspinous soft tissues within normal limits. Subcentimeter simple cyst noted within the interpolar right kidney. Visualized visceral structures otherwise unremarkable.  Disc levels:  L1-2:  Unremarkable.  L2-3:  Unremarkable.  L3-4: Mild diffuse disc bulge with disc desiccation. No significant spinal stenosis. Foramina remain patent.  L4-5: Mild diffuse disc bulge with disc desiccation. Mild bilateral facet hypertrophy. No canal or lateral recess stenosis. Mild left L4 foraminal narrowing. No significant right foraminal stenosis.  L5-S1: Mild disc bulge with disc desiccation. No significant canal or lateral recess stenosis. Foramina remain patent.  IMPRESSION: MRI CERVICAL SPINE IMPRESSION:  1. No acute abnormality within the cervical spine. Normal MRI appearance of the cervical spinal cord. 2. Right eccentric disc bulge at C6-7 with secondary mild flattening of the right hemi cord. The ventral right C7 nerve root could be affected. 3. Additional mild noncompressive disc bulging at C3-4 through C5-6 without stenosis or impingement.  MRI THORACIC SPINE IMPRESSION:  Normal MRI of the thoracic spine and spinal cord. No significant disc pathology, stenosis, or evidence for neural impingement.  MRI LUMBAR SPINE IMPRESSION:  1. No acute abnormality within the lumbar spine. 2. Mild disc bulging at L4-5 with resultant mild left L4  foraminal stenosis. 3. Additional mild noncompressive disc bulging at L3-4 and L5-S1 without stenosis or impingement. 4. Diffusely decreased T1 weighted signal intensity throughout the visualized bone marrow, nonspecific, but most commonly related to anemia, smoking, or obesity. Correlation with history and laboratory values recommended.   Electronically Signed By: Jeannine Boga M.D. On: 05/06/2019 01:19  Lumbar MR wo contrast: No valid procedures specified. Lumbar CT wo contrast: No results found for this or any previous visit.  Lumbar DG Bending views: Results for orders placed during the hospital encounter of 01/01/21  DG Lumbar Spine Complete W/Bend  Narrative CLINICAL DATA:  Low back pain  EXAM: LUMBAR SPINE - COMPLETE WITH BENDING VIEWS  COMPARISON:  08/01/2009, MRI 05/06/2019  FINDINGS: Five non rib-bearing lumbar type vertebra. Vertebral body heights are maintained. The disc spaces appear grossly patent. No suspicious change in alignment with flexion or extension. Mild narrowing of the anterior disc space at L3-L4 with flexion. Mild facet degenerative changes at L5-S1.  IMPRESSION: Mild facet degenerative changes at L5-S1.   Electronically Signed By: Donavan Foil M.D. On: 01/01/2021 21:51         Sacroiliac Joint Imaging: Sacroiliac Joint DG: Results for orders placed during the hospital encounter of 01/01/21  DG Si Joints  Narrative CLINICAL DATA:  SI  joint pain  EXAM: BILATERAL SACROILIAC JOINTS - 3+ VIEW  COMPARISON:  None.  FINDINGS: The sacroiliac joint spaces are maintained and there is no evidence of arthropathy. No other bone abnormalities are seen.  IMPRESSION: Negative.   Electronically Signed By: Donavan Foil M.D. On: 01/01/2021 21:52   Hip Imaging: Hip-R MR wo contrast: No results found for this or any previous visit.  Hip-L MR wo contrast: No results found for this or any previous visit.  Hip-R CT wo contrast:  No results found for this or any previous visit.  Hip-L CT wo contrast: No results found for this or any previous visit.  Hip-R DG 2-3 views: No results found for this or any previous visit.  Hip-L DG 2-3 views: No results found for this or any previous visit.  Hip-B DG Bilateral: No results found for this or any previous visit.   Knee Imaging: Knee-R MR wo contrast: No results found for this or any previous visit.  Knee-L MR wo contrast: No results found for this or any previous visit.  Knee-R CT wo contrast: No results found for this or any previous visit.  Knee-L CT wo contrast: No results found for this or any previous visit.  Knee-R DG 4 views: No results found for this or any previous visit.  Knee-L DG 4 views: Results for orders placed during the hospital encounter of 03/24/20  DG Knee Complete 4 Views Left  Narrative CLINICAL DATA:  Knee pain for 2 weeks, no known injury, initial encounter  EXAM: LEFT KNEE - COMPLETE 4+ VIEW  COMPARISON:  None.  FINDINGS: No acute fracture or dislocation is noted. Mild medial joint space narrowing is noted. No joint effusion is seen. No soft tissue abnormality is noted.  IMPRESSION: Mild degenerative change without acute abnormality.   Electronically Signed By: Inez Catalina M.D. On: 03/24/2020 13:31   Ankle Imaging: Ankle-R DG Complete: Results for orders placed during the hospital encounter of 03/10/16  DG Ankle Complete Right  Narrative CLINICAL DATA:  Ankle pain and swelling  EXAM: RIGHT ANKLE - COMPLETE 3+ VIEW  COMPARISON:  06/02/2016  FINDINGS: There is no evidence of fracture, dislocation, or joint effusion. There is no evidence of arthropathy or other focal bone abnormality. Soft tissues are unremarkable.  IMPRESSION: Negative.   Electronically Signed By: Kerby Moors M.D. On: 03/10/2016 13:28  Ankle-L DG Complete: Results for orders placed during the hospital encounter of 06/03/14  DG  Ankle Complete Left  Narrative CLINICAL DATA:  Fall.  Left ankle pain.  EXAM: LEFT ANKLE COMPLETE - 3+ VIEW  COMPARISON:  01/30/2014  FINDINGS: There is marked lateral soft tissue swelling. There is an spiral fracture involving the distal fibula. The fracture fragments are in near anatomic alignment. No additional fractures or subluxations identified.  IMPRESSION: 1. Spiral fracture involves the distal fibula. 2. Soft tissue swelling.   Electronically Signed By: Kerby Moors M.D. On: 06/03/2014 13:47   Foot Imaging: Foot-R DG Complete: No results found for this or any previous visit.  Foot-L DG Complete: No results found for this or any previous visit.   Elbow Imaging: Elbow-R DG Complete: No results found for this or any previous visit.  Elbow-L DG Complete: No results found for this or any previous visit.   Wrist Imaging: Wrist-R DG Complete: No results found for this or any previous visit.  Wrist-L DG Complete: No results found for this or any previous visit.   Hand Imaging: Hand-R DG Complete: No results  found for this or any previous visit.  Hand-L DG Complete: No results found for this or any previous visit.   Complexity Note: Imaging results reviewed.                         Meds   Current Outpatient Medications:    albuterol (PROVENTIL HFA;VENTOLIN HFA) 108 (90 Base) MCG/ACT inhaler, Inhale 2 puffs into the lungs every 6 (six) hours as needed for wheezing or shortness of breath., Disp: 1 Inhaler, Rfl: 2   amLODipine (NORVASC) 5 MG tablet, TAKE 1 TABLET(5 MG) BY MOUTH DAILY, Disp: 30 tablet, Rfl: 0   cholestyramine light (PREVALITE) 4 g packet, Take 1 packet (4 g total) by mouth 2 (two) times daily as needed., Disp: 28 packet, Rfl: 0   gabapentin (NEURONTIN) 300 MG capsule, Take 300 mg by mouth at bedtime., Disp: , Rfl:    naproxen (NAPROSYN) 500 MG tablet, Take 1 tablet (500 mg total) by mouth 2 (two) times daily with a meal., Disp: 30 tablet,  Rfl: 0   tiZANidine (ZANAFLEX) 4 MG tablet, Take 1 tablet (4 mg total) by mouth 3 (three) times daily., Disp: 30 tablet, Rfl: 0   vitamin B-12 1000 MCG tablet, Take 1 tablet (1,000 mcg total) by mouth daily., Disp: 30 tablet, Rfl: 0  ROS  Constitutional: Denies any fever or chills Gastrointestinal: No reported hemesis, hematochezia, vomiting, or acute GI distress Musculoskeletal: Denies any acute onset joint swelling, redness, loss of ROM, or weakness Neurological: No reported episodes of acute onset apraxia, aphasia, dysarthria, agnosia, amnesia, paralysis, loss of coordination, or loss of consciousness  Allergies  Cassidy Mathis is allergic to ace inhibitors.  PFSH  Drug: Cassidy Mathis  reports current drug use. Frequency: 1.00 time per week. Drug: Marijuana. Alcohol:  reports current alcohol use. Tobacco:  reports that she has been smoking cigarettes. She has a 6.00 pack-year smoking history. She has never used smokeless tobacco. Medical:  has a past medical history of Allergy, Anemia, Asthma, Cancer ([place]), Depression, GERD (gastroesophageal reflux disease), Headache, Hypertension, Pancreatitis, Pilonidal cyst, and Pneumonia (2018). Surgical: Cassidy Mathis  has a past surgical history that includes Tubal ligation; epigastric hernia repair (N/A, 04/18/2015); Hernia repair; Cholecystectomy (10/21/2015); ERCP (N/A, 12/21/2016); Cyst excision; Cholecystectomy (Bilateral); Cervical conization w/bx (N/A, 03/23/2017); Radical hysterectomy (04/14/2017); Salpingectomy (Bilateral, 04/14/2017); Sentinel lymph node biopsy; Abdominal hysterectomy; Lysis of adhesion (N/A, 01/01/2020); Laparoscopic salpingo oophorectomy (Right, 01/01/2020); Colonoscopy with propofol (N/A, 12/06/2020); Esophagogastroduodenoscopy (12/06/2020); Givens capsule study (N/A, 12/08/2020); and VISCERAL ANGIOGRAPHY (N/A, 12/11/2020). Family: family history includes Cancer in her maternal grandfather, maternal grandmother, paternal grandfather, paternal  grandmother, and paternal uncle; Gallstones in her mother; Gout in her father; Hypertension in her father and mother.  Constitutional Exam  General appearance: Well nourished, well developed, and well hydrated. In no apparent acute distress There were no vitals filed for this visit. BMI Assessment: Estimated body mass index is 38.27 kg/m as calculated from the following:   Height as of 08/28/21: $RemoveBe'5\' 5"'IsLKASlEk$  (1.651 m).   Weight as of 08/28/21: 230 lb (104.3 kg).  BMI interpretation table: BMI level Category Range association with higher incidence of chronic pain  <18 kg/m2 Underweight   18.5-24.9 kg/m2 Ideal body weight   25-29.9 kg/m2 Overweight Increased incidence by 20%  30-34.9 kg/m2 Obese (Class I) Increased incidence by 68%  35-39.9 kg/m2 Severe obesity (Class II) Increased incidence by 136%  >40 kg/m2 Extreme obesity (Class III) Increased incidence by 254%   Patient's current  BMI Ideal Body weight  There is no height or weight on file to calculate BMI. Patient weight not recorded   BMI Readings from Last 4 Encounters:  08/28/21 38.27 kg/m  03/30/21 38.44 kg/m  02/12/21 38.41 kg/m  12/29/20 36.61 kg/m   Wt Readings from Last 4 Encounters:  08/28/21 230 lb (104.3 kg)  03/30/21 231 lb (104.8 kg)  02/12/21 230 lb 12.8 oz (104.7 kg)  12/29/20 220 lb (99.8 kg)    Psych/Mental status: Alert, oriented x 3 (person, place, & time)       Eyes: PERLA Respiratory: No evidence of acute respiratory distress  Assessment & Plan  Primary Diagnosis & Pertinent Problem List: The primary encounter diagnosis was Chronic pain syndrome. Diagnoses of Chronic low back pain (1ry area of Pain) (Bilateral) (L>R) w/o sciatica, Chronic lower extremity pain (2ry area of Pain) (Bilateral) (L>R), Lumbosacral facet syndrome (Bilateral), Lumbosacral facet arthropathy, Lumbar facet hypertrophy (L4-5) (Bilateral), Chronic sacroiliac joint pain (Bilateral), Abnormal MRI, lumbar spine (05/06/2019), Abnormal MRI,  cervical spine (05/06/2019), and Abnormal MRI, thoracic spine (05/06/2019) were also pertinent to this visit.  Visit Diagnosis: 1. Chronic pain syndrome   2. Chronic low back pain (1ry area of Pain) (Bilateral) (L>R) w/o sciatica   3. Chronic lower extremity pain (2ry area of Pain) (Bilateral) (L>R)   4. Lumbosacral facet syndrome (Bilateral)   5. Lumbosacral facet arthropathy   6. Lumbar facet hypertrophy (L4-5) (Bilateral)   7. Chronic sacroiliac joint pain (Bilateral)   8. Abnormal MRI, lumbar spine (05/06/2019)   9. Abnormal MRI, cervical spine (05/06/2019)   10. Abnormal MRI, thoracic spine (05/06/2019)    Problems updated and reviewed during this visit: No problems updated.  Plan of Care  Pharmacotherapy (Medications Ordered): No orders of the defined types were placed in this encounter.  Procedure Orders    No procedure(s) ordered today   Lab Orders  No laboratory test(s) ordered today   Imaging Orders  No imaging studies ordered today   Referral Orders  No referral(s) requested today    Pharmacological management:  Opioid Analgesics: I will not be prescribing any opioids at this time Membrane stabilizer: I will not be prescribing any at this time Muscle relaxant: I will not be prescribing any at this time NSAID: I will not be prescribing any at this time Other analgesic(s): I will not be prescribing any at this time     Interventional Therapies  Risk  Complexity Considerations:   Estimated body mass index is 36.61 kg/m as calculated from the following:   Height as of this encounter: $RemoveBeforeD'5\' 5"'[id number]$  (1.651 m).   Weight as of this encounter: 220 lb (99.8 kg). WNL   Planned  Pending:   Diagnostic bilateral lumbar facet MBB #1    Under consideration:   Diagnostic bilateral lumbar facet MBB #1  Diagnostic bilateral sacroiliac joint block #1    Completed by Girtha Hake, MD [facility] PMR):   Diagnostic/therapeutic bilateral L5 TFESI x2 (06/04/2019 & 08/02/2019)  (no benefit & painful)    Completed:   None at this time   Therapeutic  Palliative (PRN) options:   None established     Provider-requested follow-up: No follow-ups on file. Recent Visits No visits were found meeting these conditions. Showing recent visits within past 90 days and meeting all other requirements Future Appointments Date Type Provider Dept  10/28/21 Appointment Milinda Pointer, MD [facility]  Showing future appointments within next 90 days and meeting all other requirements  Primary Care Physician:  [facility] Note by: Gaspar Cola, MD Date: 10/28/2021; Time: 7:09 AM

## 2021-10-28 ENCOUNTER — Encounter: Payer: Self-pay | Admitting: Pain Medicine

## 2021-10-28 ENCOUNTER — Ambulatory Visit: Payer: Medicaid Other | Attending: Pain Medicine | Admitting: Pain Medicine

## 2021-10-28 VITALS — BP 148/94 | HR 80 | Temp 97.7°F | Resp 16 | Ht 65.0 in | Wt 220.0 lb

## 2021-10-28 DIAGNOSIS — R937 Abnormal findings on diagnostic imaging of other parts of musculoskeletal system: Secondary | ICD-10-CM | POA: Insufficient documentation

## 2021-10-28 DIAGNOSIS — M7062 Trochanteric bursitis, left hip: Secondary | ICD-10-CM | POA: Diagnosis present

## 2021-10-28 DIAGNOSIS — R52 Pain, unspecified: Secondary | ICD-10-CM | POA: Diagnosis present

## 2021-10-28 DIAGNOSIS — M47817 Spondylosis without myelopathy or radiculopathy, lumbosacral region: Secondary | ICD-10-CM | POA: Insufficient documentation

## 2021-10-28 DIAGNOSIS — R892 Abnormal level of other drugs, medicaments and biological substances in specimens from other organs, systems and tissues: Secondary | ICD-10-CM | POA: Diagnosis present

## 2021-10-28 DIAGNOSIS — M5137 Other intervertebral disc degeneration, lumbosacral region: Secondary | ICD-10-CM | POA: Insufficient documentation

## 2021-10-28 DIAGNOSIS — R700 Elevated erythrocyte sedimentation rate: Secondary | ICD-10-CM | POA: Diagnosis present

## 2021-10-28 DIAGNOSIS — M1712 Unilateral primary osteoarthritis, left knee: Secondary | ICD-10-CM | POA: Diagnosis present

## 2021-10-28 DIAGNOSIS — G8929 Other chronic pain: Secondary | ICD-10-CM | POA: Diagnosis present

## 2021-10-28 DIAGNOSIS — R29898 Other symptoms and signs involving the musculoskeletal system: Secondary | ICD-10-CM | POA: Insufficient documentation

## 2021-10-28 DIAGNOSIS — M25552 Pain in left hip: Secondary | ICD-10-CM | POA: Diagnosis present

## 2021-10-28 DIAGNOSIS — F1291 Cannabis use, unspecified, in remission: Secondary | ICD-10-CM | POA: Insufficient documentation

## 2021-10-28 DIAGNOSIS — M25551 Pain in right hip: Secondary | ICD-10-CM | POA: Diagnosis present

## 2021-10-28 DIAGNOSIS — M79604 Pain in right leg: Secondary | ICD-10-CM | POA: Insufficient documentation

## 2021-10-28 DIAGNOSIS — M533 Sacrococcygeal disorders, not elsewhere classified: Secondary | ICD-10-CM | POA: Diagnosis present

## 2021-10-28 DIAGNOSIS — E559 Vitamin D deficiency, unspecified: Secondary | ICD-10-CM | POA: Insufficient documentation

## 2021-10-28 DIAGNOSIS — M79605 Pain in left leg: Secondary | ICD-10-CM | POA: Diagnosis present

## 2021-10-28 DIAGNOSIS — M48061 Spinal stenosis, lumbar region without neurogenic claudication: Secondary | ICD-10-CM | POA: Diagnosis present

## 2021-10-28 DIAGNOSIS — M545 Low back pain, unspecified: Secondary | ICD-10-CM | POA: Diagnosis present

## 2021-10-28 DIAGNOSIS — M5417 Radiculopathy, lumbosacral region: Secondary | ICD-10-CM | POA: Insufficient documentation

## 2021-10-28 DIAGNOSIS — M47816 Spondylosis without myelopathy or radiculopathy, lumbar region: Secondary | ICD-10-CM | POA: Diagnosis present

## 2021-10-28 DIAGNOSIS — M7061 Trochanteric bursitis, right hip: Secondary | ICD-10-CM | POA: Insufficient documentation

## 2021-10-28 DIAGNOSIS — G894 Chronic pain syndrome: Secondary | ICD-10-CM | POA: Insufficient documentation

## 2021-10-28 MED ORDER — ERGOCALCIFEROL 1.25 MG (50000 UT) PO CAPS: 50000.0000 [IU] | ORAL_CAPSULE | ORAL | 0 refills | Status: DC

## 2021-10-28 MED ORDER — VITAMIN D3 125 MCG (5000 UT) PO CAPS: 1.0000 | ORAL_CAPSULE | Freq: Every day | ORAL | 2 refills | Status: DC

## 2021-10-28 NOTE — Patient Instructions (Addendum)
____________________________________________________________________________________________  Muscle Spasms & Cramps  Cause(s):  Most common - vitamin and/or electrolyte (calcium, potassium, sodium, etc.) deficiencies. Post procedure - steroids can make your kidneys excrete electrolytes. If you happen to have been borderline low on your electrolytes, it may temporarily triggering cramps & spasms.  Possible triggers: Sweating - causes loss of electrolytes thru the skin. Steroids - causes loss of electrolytes thru the urine.  Treatment: Gatorade (or any other electrolyte-replenishing drink) - Take 1, 8 oz glass with each meal (3 times a day). OTC (over-the-counter) Magnesium 400 to 500 mg - Take 1 tablet twice a day (one with breakfast and one before bedtime). If you have kidney problems, talk to your primary care physician before taking any Magnesium. Tonic Water with quinine - Take 1, 8 oz glass before bedtime.   ____________________________________________________________________________________________ ______________________________________________________________________  Preparing for Procedure with Sedation  NOTICE: Due to recent regulatory changes, starting on August 25, 2020, procedures requiring intravenous (IV) sedation will no longer be performed at the [facility].  These types of procedures are required to be performed at [facility] ambulatory surgery facility.  We are very sorry for the inconvenience.  Procedure appointments are limited to planned procedures: No Prescription Refills. No disability issues will be discussed. No medication changes will be discussed.  Instructions: Oral Intake: Do not eat or drink anything for at least 8 hours prior to your procedure. (Exception: Blood Pressure Medication. See below.) Transportation: A driver is required. You may not drive yourself after the procedure. Blood Pressure Medicine: Do not forget to take your blood pressure  medicine with a sip of water the morning of the procedure. If your Diastolic (lower reading) is above 100 mmHg, elective cases will be cancelled/rescheduled. Blood thinners: These will need to be stopped for procedures. Notify our staff if you are taking any blood thinners. Depending on which one you take, there will be specific instructions on how and when to stop it. Diabetics on insulin: Notify the staff so that you can be scheduled 1st case in the morning. If your diabetes requires high dose insulin, take only  of your normal insulin dose the morning of the procedure and notify the staff that you have done so. Preventing infections: Shower with an antibacterial soap the morning of your procedure. Build-up your immune system: Take 1000 mg of Vitamin C with every meal (3 times a day) the day prior to your procedure. Antibiotics: Inform the staff if you have a condition or reason that requires you to take antibiotics before dental procedures. Pregnancy: If you are pregnant, call and cancel the procedure. Sickness: If you have a cold, fever, or any active infections, call and cancel the procedure. Arrival: You must be in the facility at least 30 minutes prior to your scheduled procedure. Children: Do not bring children with you. Dress appropriately: There is always the possibility that your clothing may get soiled. Valuables: Do not bring any jewelry or valuables.  Reasons to call and reschedule or cancel your procedure: (Following these recommendations will minimize the risk of a serious complication.) Surgeries: Avoid having procedures within 2 weeks of any surgery. (Avoid for 2 weeks before or after any surgery). Flu Shots: Avoid having procedures within 2 weeks of a flu shots. (Avoid for 2 weeks before or after immunizations). Barium: Avoid having a procedure within 7-10 days after having had a radiological study involving the use of radiological contrast. (Myelograms, Barium swallow or enema  study). Heart attacks: Avoid any elective procedures or surgeries  for the initial 6 months after a "Myocardial Infarction" (Heart Attack). Blood thinners: It is imperative that you stop these medications before procedures. Let us know if you if you take any blood thinner.  Infection: Avoid procedures during or within two weeks of an infection (including chest colds or gastrointestinal problems). Symptoms associated with infections include: Localized redness, fever, chills, night sweats or profuse sweating, burning sensation when voiding, cough, congestion, stuffiness, runny nose, sore throat, diarrhea, nausea, vomiting, cold or Flu symptoms, recent or current infections. It is specially important if the infection is over the area that we intend to treat. Heart and lung problems: Symptoms that may suggest an active cardiopulmonary problem include: cough, chest pain, breathing difficulties or shortness of breath, dizziness, ankle swelling, uncontrolled high or unusually low blood pressure, and/or palpitations. If you are experiencing any of these symptoms, cancel your procedure and contact your primary care physician for an evaluation.  Remember:  Regular Business hours are:  Monday to Thursday 8:00 AM to 4:00 PM  Provider's Schedule: Milinda Pointer, MD:  Procedure days: Tuesday and Thursday 7:30 AM to 4:00 PM  Gillis Santa, MD:  Procedure days: Monday and Wednesday 7:30 AM to 4:00 PM ______________________________________________________________________  ____________________________________________________________________________________________  General Risks and Possible Complications  Patient Responsibilities: It is important that you read this as it is part of your informed consent. It is our duty to inform you of the risks and possible complications associated with treatments offered to you. It is your responsibility as a patient to read this and to ask questions about anything that is  not clear or that you believe was not covered in this document.  Patient's Rights: You have the right to refuse treatment. You also have the right to change your mind, even after initially having agreed to have the treatment done. However, under this last option, if you wait until the last second to change your mind, you may be charged for the materials used up to that point.  Introduction: Medicine is not an Chief Strategy Officer. Everything in Medicine, including the lack of treatment(s), carries the potential for danger, harm, or loss (which is by definition: Risk). In Medicine, a complication is a secondary problem, condition, or disease that can aggravate an already existing one. All treatments carry the risk of possible complications. The fact that a side effects or complications occurs, does not imply that the treatment was conducted incorrectly. It must be clearly understood that these can happen even when everything is done following the highest safety standards.  No treatment: You can choose not to proceed with the proposed treatment alternative. The "PRO(s)" would include: avoiding the risk of complications associated with the therapy. The "CON(s)" would include: not getting any of the treatment benefits. These benefits fall under one of three categories: diagnostic; therapeutic; and/or palliative. Diagnostic benefits include: getting information which can ultimately lead to improvement of the disease or symptom(s). Therapeutic benefits are those associated with the successful treatment of the disease. Finally, palliative benefits are those related to the decrease of the primary symptoms, without necessarily curing the condition (example: decreasing the pain from a flare-up of a chronic condition, such as incurable terminal cancer).  General Risks and Complications: These are associated to most interventional treatments. They can occur alone, or in combination. They fall under one of the following six (6)  categories: no benefit or worsening of symptoms; bleeding; infection; nerve damage; allergic reactions; and/or death. No benefits or worsening of symptoms: In Medicine there are no guarantees,  only probabilities. No healthcare provider can ever guarantee that a medical treatment will work, they can only state the probability that it may. Furthermore, there is always the possibility that the condition may worsen, either directly, or indirectly, as a consequence of the treatment. Bleeding: This is more common if the patient is taking a blood thinner, either prescription or over the counter (example: Goody Powders, Fish oil, Aspirin, Garlic, etc.), or if suffering a condition associated with impaired coagulation (example: Hemophilia, cirrhosis of the liver, low platelet counts, etc.). However, even if you do not have one on these, it can still happen. If you have any of these conditions, or take one of these drugs, make sure to notify your treating physician. Infection: This is more common in patients with a compromised immune system, either due to disease (example: diabetes, cancer, human immunodeficiency virus [HIV], etc.), or due to medications or treatments (example: therapies used to treat cancer and rheumatological diseases). However, even if you do not have one on these, it can still happen. If you have any of these conditions, or take one of these drugs, make sure to notify your treating physician. Nerve Damage: This is more common when the treatment is an invasive one, but it can also happen with the use of medications, such as those used in the treatment of cancer. The damage can occur to small secondary nerves, or to large primary ones, such as those in the spinal cord and brain. This damage may be temporary or permanent and it may lead to impairments that can range from temporary numbness to permanent paralysis and/or brain death. Allergic Reactions: Any time a substance or material comes in contact  with our body, there is the possibility of an allergic reaction. These can range from a mild skin rash (contact dermatitis) to a severe systemic reaction (anaphylactic reaction), which can result in death. Death: In general, any medical intervention can result in death, most of the time due to an unforeseen complication. ____________________________________________________________________________________________

## 2021-10-29 ENCOUNTER — Telehealth: Payer: Self-pay

## 2021-10-29 NOTE — Addendum Note (Signed)
Addended by: Milinda Pointer A on: 10/29/2021 01:07 PM   Modules accepted: Orders

## 2021-10-29 NOTE — Telephone Encounter (Addendum)
Insurance Treatment Denial Note  Date order was entered:  Order entered by: Milinda Pointer, MD Requested treatment: lesi  Reason for denial: Documentation of Physical Therapy is missing. Recommended for approval:  PT, recent MRI or CT   They will not approve this procedure because the patient has not had PT or a recent MRI or CT. She will need both before I can request again.  I called the patient and she said she did have PT, that you ordered it, but in the notes it clearly states patient denies PT. I did find the notes in Epic. All she will need is a recent MRI or CT now.

## 2021-11-07 NOTE — Progress Notes (Unsigned)
PROVIDER NOTE: Interpretation of information contained herein should be left to medically-trained personnel. Specific patient instructions are provided elsewhere under "Patient Instructions" section of medical record. This document was created in part using STT-dictation technology, any transcriptional errors that may result from this process are unintentional.  Patient: Cassidy Mathis Seats Type: Established DOB: 07/09/1975 MRN: [id number] P[facility]: [facility]  Service: Procedure DOS: 11/10/2021 Setting: Ambulatory Location: Ambulatory outpatient facility Delivery: Face-to-face Provider: Gaspar Cola, MD Specialty: Interventional Pain Management Specialty designation: 09 Location: Outpatient facility Ref. Prov.: Milinda Pointer, MD    Primary Reason for Visit: Interventional Pain Management Treatment. CC: No chief complaint on file.   Procedure:           Type: Lumbar epidural steroid injection (LESI) (interlaminar) #1    Laterality: Left   Level:  L4-5 Level.  Imaging: Fluoroscopic guidance         Anesthesia: Local anesthesia (1-2% Lidocaine) Anxiolysis: None                 Sedation:                         DOS: 11/10/2021  Performed by: Gaspar Cola, MD  Purpose: Diagnostic/Therapeutic Indications: Lumbar radicular pain of intraspinal etiology of more than 4 weeks that has failed to respond to conservative therapy and is severe enough to impact quality of life or function. No diagnosis found. NAS-11 Pain score:   Pre-procedure:  /10   Post-procedure:  /10      Position / Prep / Materials:  Position: Prone w/ head of the table raised (slight reverse trendelenburg) to facilitate breathing.  Prep solution: DuraPrep (Iodine Povacrylex [0.7% available iodine] and Isopropyl Alcohol, 74% w/w) Prep Area: Entire Posterior Lumbar Region from lower scapular tip down to mid buttocks area and from flank to flank. Materials:  Tray: Epidural  tray Needle(s):  Type: Epidural needle (Tuohy) Gauge (G):  17 Length: Regular (3.5-in) Qty: 1  Pre-op H&P Assessment:  Cassidy Mathis is a 46 y.o. (year old), female patient, seen today for interventional treatment. She  has a past surgical history that includes Tubal ligation; epigastric hernia repair (N/A, 04/18/2015); Hernia repair; Cholecystectomy (10/21/2015); ERCP (N/A, 12/21/2016); Cyst excision; Cholecystectomy (Bilateral); Cervical conization w/bx (N/A, 03/23/2017); Radical hysterectomy (04/14/2017); Salpingectomy (Bilateral, 04/14/2017); Sentinel lymph node biopsy; Abdominal hysterectomy; Lysis of adhesion (N/A, 01/01/2020); Laparoscopic salpingo oophorectomy (Right, 01/01/2020); Colonoscopy with propofol (N/A, 12/06/2020); Esophagogastroduodenoscopy (12/06/2020); Givens capsule study (N/A, 12/08/2020); and VISCERAL ANGIOGRAPHY (N/A, 12/11/2020). Cassidy Mathis has a current medication list which includes the following prescription(s): albuterol, amlodipine, vitamin d3, ergocalciferol, gabapentin, and victoza. Her primarily concern today is the No chief complaint on file.  Initial Vital Signs:  Pulse/HCG Rate:    Temp:   Resp:   BP:   SpO2:    BMI: Estimated body mass index is 36.61 kg/m as calculated from the following:   Height as of 10/28/21: '5\' 5"'$  (1.651 m).   Weight as of 10/28/21: 220 lb (99.8 kg).  Risk Assessment: Allergies: Reviewed. She is allergic to ace inhibitors.  Allergy Precautions: None required Coagulopathies: Reviewed. None identified.  Blood-thinner therapy: None at this time Active Infection(s): Reviewed. None identified. Cassidy Mathis is afebrile  Site Confirmation: Cassidy Mathis was asked to confirm the procedure and laterality before marking the site Procedure checklist: Completed Consent: Before the procedure and under the influence of no sedative(s), amnesic(s), or anxiolytics, the patient was informed of the treatment options,  risks and possible complications. To fulfill  our ethical and legal obligations, as recommended by the American Medical Association's Code of Ethics, I have informed the patient of my clinical impression; the nature and purpose of the treatment or procedure; the risks, benefits, and possible complications of the intervention; the alternatives, including doing nothing; the risk(s) and benefit(s) of the alternative treatment(s) or procedure(s); and the risk(s) and benefit(s) of doing nothing. The patient was provided information about the general risks and possible complications associated with the procedure. These may include, but are not limited to: failure to achieve desired goals, infection, bleeding, organ or nerve damage, allergic reactions, paralysis, and death. In addition, the patient was informed of those risks and complications associated to Spine-related procedures, such as failure to decrease pain; infection (i.e.: Meningitis, epidural or intraspinal abscess); bleeding (i.e.: epidural hematoma, subarachnoid hemorrhage, or any other type of intraspinal or peri-dural bleeding); organ or nerve damage (i.e.: Any type of peripheral nerve, nerve root, or spinal cord injury) with subsequent damage to sensory, motor, and/or autonomic systems, resulting in permanent pain, numbness, and/or weakness of one or several areas of the body; allergic reactions; (i.e.: anaphylactic reaction); and/or death. Furthermore, the patient was informed of those risks and complications associated with the medications. These include, but are not limited to: allergic reactions (i.e.: anaphylactic or anaphylactoid reaction(s)); adrenal axis suppression; blood sugar elevation that in diabetics may result in ketoacidosis or comma; water retention that in patients with history of congestive heart failure may result in shortness of breath, pulmonary edema, and decompensation with resultant heart failure; weight gain; swelling or edema; medication-induced neural toxicity;  particulate matter embolism and blood vessel occlusion with resultant organ, and/or nervous system infarction; and/or aseptic necrosis of one or more joints. Finally, the patient was informed that Medicine is not an exact science; therefore, there is also the possibility of unforeseen or unpredictable risks and/or possible complications that may result in a catastrophic outcome. The patient indicated having understood very clearly. We have given the patient no guarantees and we have made no promises. Enough time was given to the patient to ask questions, all of which were answered to the patient's satisfaction. Ms. Salmi has indicated that she wanted to continue with the procedure. Attestation: I, the ordering provider, attest that I have discussed with the patient the benefits, risks, side-effects, alternatives, likelihood of achieving goals, and potential problems during recovery for the procedure that I have provided informed consent. Date  Time: {CHL [facility]-PAIN TIME CHOICES:21018001}  Pre-Procedure Preparation:  Monitoring: As per clinic protocol. Respiration, ETCO2, SpO2, BP, heart rate and rhythm monitor placed and checked for adequate function Safety Precautions: Patient was assessed for positional comfort and pressure points before starting the procedure. Time-out: I initiated and conducted the "Time-out" before starting the procedure, as per protocol. The patient was asked to participate by confirming the accuracy of the "Time Out" information. Verification of the correct person, site, and procedure were performed and confirmed by me, the nursing staff, and the patient. "Time-out" conducted as per Joint Commission's Universal Protocol (UP.01.01.01). Time:    Description/Narrative of Procedure:          Target: Epidural space via interlaminar opening, initially targeting the lower laminar border of the superior vertebral body. Region: Lumbar Approach: Percutaneous paravertebral  Rationale  (medical necessity): procedure needed and proper for the diagnosis and/or treatment of the patient's medical symptoms and needs. Procedural Technique Safety Precautions: Aspiration looking for blood return was conducted prior to all injections. At  no point did we inject any substances, as a needle was being advanced. No attempts were made at seeking any paresthesias. Safe injection practices and needle disposal techniques used. Medications properly checked for expiration dates. SDV (single dose vial) medications used. Description of the Procedure: Protocol guidelines were followed. The procedure needle was introduced through the skin, ipsilateral to the reported pain, and advanced to the target area. Bone was contacted and the needle walked caudad, until the lamina was cleared. The epidural space was identified using "loss-of-resistance technique" with 2-3 ml of PF-NaCl (0.9% NSS), in a 5cc LOR glass syringe.  There were no vitals filed for this visit.  Start Time:   hrs. End Time:   hrs.  Imaging Guidance (Spinal):          Type of Imaging Technique: Fluoroscopy Guidance (Spinal) Indication(s): Assistance in needle guidance and placement for procedures requiring needle placement in or near specific anatomical locations not easily accessible without such assistance. Exposure Time: Please see nurses notes. Contrast: Before injecting any contrast, we confirmed that the patient did not have an allergy to iodine, shellfish, or radiological contrast. Once satisfactory needle placement was completed at the desired level, radiological contrast was injected. Contrast injected under live fluoroscopy. No contrast complications. See chart for type and volume of contrast used. Fluoroscopic Guidance: I was personally present during the use of fluoroscopy. "Tunnel Vision Technique" used to obtain the best possible view of the target area. Parallax error corrected before commencing the procedure.  "Direction-depth-direction" technique used to introduce the needle under continuous pulsed fluoroscopy. Once target was reached, antero-posterior, oblique, and lateral fluoroscopic projection used confirm needle placement in all planes. Images permanently stored in EMR. Interpretation: I personally interpreted the imaging intraoperatively. Adequate needle placement confirmed in multiple planes. Appropriate spread of contrast into desired area was observed. No evidence of afferent or efferent intravascular uptake. No intrathecal or subarachnoid spread observed. Permanent images saved into the patient's record.  Antibiotic Prophylaxis:   Anti-infectives (From admission, onward)    None      Indication(s): None identified  Post-operative Assessment:  Post-procedure Vital Signs:  Pulse/HCG Rate:    Temp:   Resp:   BP:   SpO2:    EBL: None  Complications: No immediate post-treatment complications observed by team, or reported by patient.  Note: The patient tolerated the entire procedure well. A repeat set of vitals were taken after the procedure and the patient was kept under observation following institutional policy, for this type of procedure. Post-procedural neurological assessment was performed, showing return to baseline, prior to discharge. The patient was provided with post-procedure discharge instructions, including a section on how to identify potential problems. Should any problems arise concerning this procedure, the patient was given instructions to immediately contact us, at any time, without hesitation. In any case, we plan to contact the patient by telephone for a follow-up status report regarding this interventional procedure.  Comments:  No additional relevant information.  Plan of Care  Orders:  No orders of the defined types were placed in this encounter.  Chronic Opioid Analgesic:  Hydrocodone/APAP 5/325, 1 tab p.o. 4 times daily (#12) (last filled 08/28/2021) MME/day:  20 mg/day   Medications ordered for procedure: No orders of the defined types were placed in this encounter.  Medications administered: Rosselyn Mathis. [place] had no medications administered during this visit.  See the medical record for exact dosing, route, and time of administration.  Follow-up plan:   No follow-ups on file.  Interventional Therapies  Risk  Complexity Considerations:   Estimated body mass index is 36.61 kg/m as calculated from the following:   Height as of this encounter: '5\' 5"'$  (1.651 m).   Weight as of this encounter: 220 lb (99.8 kg). WNL   Planned  Pending:   Diagnostic left L4-5 LESI #1  Prescriptions to replace her vitamin D deficiency were sent to the pharmacy.   Under consideration:   Diagnostic bilateral lumbar facet MBB #1  Diagnostic bilateral sacroiliac joint block #1  Diagnostic bilateral IA hip joint injection #1  Diagnostic bilateral trochanteric bursa injection #1    Completed:   PT referral (12/29/2020).  She describes having attended twice a week for 2-3 weeks for a total of 4-5 sessions.  Not only did the physical therapy did not help the pain but it actually worsened it.   Completed by Girtha Hake, MD [facility] PMR):   Diagnostic/therapeutic bilateral L5 TFESI x2 (06/04/2019 & 08/02/2019) (no benefit & painful)    Therapeutic  Palliative (PRN) options:   None established     Recent Visits Date Type Provider Dept  10/28/21 Office Visit Milinda Pointer, MD [facility]  Showing recent visits within past 90 days and meeting all other requirements Future Appointments Date Type Provider Dept  11/10/21 Appointment Milinda Pointer, MD [facility]  Showing future appointments within next 90 days and meeting all other requirements  Disposition: Discharge home  Discharge (Date  Time): 11/10/2021;   hrs.   Primary Care Physician: [facility] [place] Location: [facility] Note by: Gaspar Cola, MD Date: 11/10/2021; Time: 9:11 AM  Disclaimer:  Medicine is not an Chief Strategy Officer. The only guarantee in medicine is that nothing is guaranteed. It is important to note that the decision to proceed with this intervention was based on the information collected from the patient. The Data and conclusions were drawn from the patient's questionnaire, the interview, and the physical examination. Because the information was provided in large part by the patient, it cannot be guaranteed that it has not been purposely or unconsciously manipulated. Every effort has been made to obtain as much relevant data as possible for this evaluation. It is important to note that the conclusions that lead to this procedure are derived in large part from the available data. Always take into account that the treatment will also be dependent on availability of resources and existing treatment guidelines, considered by other Pain Management Practitioners as being common knowledge and practice, at the time of the intervention. For Medico-Legal purposes, it is also important to point out that variation in procedural techniques and pharmacological choices are the acceptable norm. The indications, contraindications, technique, and results of the above procedure should only be interpreted and judged by a Board-Certified Interventional Pain Specialist with extensive familiarity and expertise in the same exact procedure and technique.

## 2021-11-10 ENCOUNTER — Encounter: Payer: Self-pay | Admitting: Pain Medicine

## 2021-11-10 ENCOUNTER — Ambulatory Visit
Admission: RE | Admit: 2021-11-10 | Discharge: 2021-11-10 | Disposition: A | Discharge status: Home / Self Care | Payer: Medicaid Other | Source: Ambulatory Visit | Attending: Pain Medicine | Admitting: Pain Medicine

## 2021-11-10 ENCOUNTER — Ambulatory Visit: Payer: Medicaid Other | Attending: Pain Medicine | Admitting: Pain Medicine

## 2021-11-10 VITALS — BP 120/82 | HR 85 | Temp 97.2°F | Resp 12 | Ht 65.0 in | Wt 220.0 lb

## 2021-11-10 DIAGNOSIS — M79605 Pain in left leg: Secondary | ICD-10-CM | POA: Insufficient documentation

## 2021-11-10 DIAGNOSIS — M79604 Pain in right leg: Secondary | ICD-10-CM | POA: Insufficient documentation

## 2021-11-10 DIAGNOSIS — R937 Abnormal findings on diagnostic imaging of other parts of musculoskeletal system: Secondary | ICD-10-CM | POA: Insufficient documentation

## 2021-11-10 DIAGNOSIS — G8929 Other chronic pain: Secondary | ICD-10-CM | POA: Diagnosis present

## 2021-11-10 DIAGNOSIS — R29898 Other symptoms and signs involving the musculoskeletal system: Secondary | ICD-10-CM | POA: Diagnosis present

## 2021-11-10 DIAGNOSIS — M545 Low back pain, unspecified: Secondary | ICD-10-CM | POA: Insufficient documentation

## 2021-11-10 DIAGNOSIS — M5417 Radiculopathy, lumbosacral region: Secondary | ICD-10-CM | POA: Diagnosis present

## 2021-11-10 DIAGNOSIS — M48061 Spinal stenosis, lumbar region without neurogenic claudication: Secondary | ICD-10-CM | POA: Insufficient documentation

## 2021-11-10 DIAGNOSIS — M5137 Other intervertebral disc degeneration, lumbosacral region: Secondary | ICD-10-CM | POA: Insufficient documentation

## 2021-11-10 DIAGNOSIS — M47816 Spondylosis without myelopathy or radiculopathy, lumbar region: Secondary | ICD-10-CM | POA: Insufficient documentation

## 2021-11-10 MED ORDER — PENTAFLUOROPROP-TETRAFLUOROETH EX AERO: INHALATION_SPRAY | Freq: Once | CUTANEOUS | Status: DC

## 2021-11-10 MED ORDER — LIDOCAINE HCL (PF) 2 % IJ SOLN
INTRAMUSCULAR | Status: AC
  Filled 2021-11-10: qty 5

## 2021-11-10 MED ORDER — FENTANYL CITRATE (PF) 100 MCG/2ML IJ SOLN
INTRAMUSCULAR | Status: AC
  Filled 2021-11-10: qty 2

## 2021-11-10 MED ORDER — LACTATED RINGERS IV SOLN: Freq: Once | INTRAVENOUS | Status: AC

## 2021-11-10 MED ORDER — TRIAMCINOLONE ACETONIDE 40 MG/ML IJ SUSP
40.0000 mg | Freq: Once | INTRAMUSCULAR | Status: AC
  Administered 2021-11-10: 40 mg

## 2021-11-10 MED ORDER — FENTANYL CITRATE (PF) 100 MCG/2ML IJ SOLN
25.0000 ug | INTRAMUSCULAR | Status: DC | PRN
  Administered 2021-11-10: 50 ug via INTRAVENOUS

## 2021-11-10 MED ORDER — TRIAMCINOLONE ACETONIDE 40 MG/ML IJ SUSP
INTRAMUSCULAR | Status: AC
  Filled 2021-11-10: qty 1

## 2021-11-10 MED ORDER — ROPIVACAINE HCL 2 MG/ML IJ SOLN
INTRAMUSCULAR | Status: AC
  Filled 2021-11-10: qty 20

## 2021-11-10 MED ORDER — IOHEXOL 180 MG/ML  SOLN
10.0000 mL | Freq: Once | INTRAMUSCULAR | Status: AC
  Administered 2021-11-10: 10 mL via EPIDURAL

## 2021-11-10 MED ORDER — IOHEXOL 180 MG/ML  SOLN
INTRAMUSCULAR | Status: AC
  Filled 2021-11-10: qty 10

## 2021-11-10 MED ORDER — MIDAZOLAM HCL 5 MG/5ML IJ SOLN
INTRAMUSCULAR | Status: AC
  Filled 2021-11-10: qty 5

## 2021-11-10 MED ORDER — LIDOCAINE HCL 2 % IJ SOLN
20.0000 mL | Freq: Once | INTRAMUSCULAR | Status: AC
  Administered 2021-11-10: 100 mg

## 2021-11-10 MED ORDER — ROPIVACAINE HCL 2 MG/ML IJ SOLN
2.0000 mL | Freq: Once | INTRAMUSCULAR | Status: AC
  Administered 2021-11-10: 2 mL via EPIDURAL

## 2021-11-10 MED ORDER — SODIUM CHLORIDE 0.9% FLUSH
2.0000 mL | Freq: Once | INTRAVENOUS | Status: AC
  Administered 2021-11-10: 2 mL

## 2021-11-10 MED ORDER — MIDAZOLAM HCL 5 MG/5ML IJ SOLN
0.5000 mg | Freq: Once | INTRAMUSCULAR | Status: AC
  Administered 2021-11-10: 1 mg via INTRAVENOUS

## 2021-11-10 MED ORDER — SODIUM CHLORIDE (PF) 0.9 % IJ SOLN
INTRAMUSCULAR | Status: AC
  Filled 2021-11-10: qty 10

## 2021-11-10 NOTE — Patient Instructions (Signed)

## 2021-11-10 NOTE — Progress Notes (Signed)
Safety precautions to be maintained throughout the outpatient stay will include: orient to surroundings, keep bed in low position, maintain call bell within reach at all times, provide assistance with transfer out of bed and ambulation.  

## 2021-11-10 NOTE — Progress Notes (Signed)
[place] PER MD ORDER.

## 2021-11-11 ENCOUNTER — Telehealth: Payer: Self-pay | Admitting: *Deleted

## 2021-11-11 NOTE — Telephone Encounter (Signed)
Post procedure call;  patient reports that she is doing well.

## 2021-11-23 ENCOUNTER — Encounter (INDEPENDENT_AMBULATORY_CARE_PROVIDER_SITE_OTHER): Payer: Self-pay

## 2021-11-23 NOTE — Progress Notes (Unsigned)
PROVIDER NOTE: Information contained herein reflects review and annotations entered in association with encounter. Interpretation of such information and data should be left to medically-trained personnel. Information provided to patient can be located elsewhere in the medical record under "Patient Instructions". Document created using STT-dictation technology, any transcriptional errors that may result from process are unintentional.    Patient: Cassidy Mathis  Service Category: E/M  Provider: Gaspar Cola, MD  DOB: 08/19/1975  DOS: 11/24/2021  Referring Provider: [facility], Jenny Reichmann*  MRN: [id number]  Specialty: Interventional Pain Management  PCP: [facility], [place]  Type: Established Patient  Setting: Ambulatory outpatient    Location: Office  Delivery: Face-to-face     HPI  Ms. Aquilla L Duncanson, a 46 y.o. year old female, is here today because of her No primary diagnosis found.. Ms. Keil primary complain today is No chief complaint on file. Last encounter: My last encounter with her was on 11/10/2021. Pertinent problems: Ms. 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); Malignant neoplasm of overlapping sites of cervix ([place]); Chronic pain syndrome; Abnormal MRI, cervical spine (05/06/2019); Abnormal MRI, lumbar spine (05/06/2019); Chronic low back pain (1ry area of Pain) (Bilateral) (L>R) w/o sciatica; Chronic lower extremity pain (2ry area of Pain) (Bilateral) (L>R); Chronic sacroiliac joint pain (Bilateral) (R>L); Lumbosacral facet syndrome (Bilateral); Lumbar facet hypertrophy (L4-5) (Bilateral); Lumbosacral facet arthropathy; Somatic dysfunction of sacroiliac joints (Bilateral); Lumbosacral radiculopathy at L5 (Bilateral) (L>R); Lumbosacral radiculopathy at S1 (Bilateral) (L>R); Weakness of lower  extremity (Left); Chronic hip pain (Bilateral) (L>R); Greater trochanteric bursitis of hips (Bilateral) (R>L); DDD (degenerative disc disease), lumbosacral; Chronic generalized pain; and Osteoarthritis of knee (Left) on their pertinent problem list. Pain Assessment: Severity of   is reported as a  /10. Location:    / . Onset:  . Quality:  . Timing:  . Modifying factor(s):  Marland Kitchen Vitals:  vitals were not taken for this visit.   Reason for encounter: post-procedure evaluation and assessment. ***  Post-procedure evaluation   Type: Lumbar epidural steroid injection (LESI) (interlaminar) #1    Laterality: Left   Level:  L4-5 Level.  Imaging: Fluoroscopic guidance         Anesthesia: Local anesthesia (1-2% Lidocaine) Anxiolysis: IV Versed 1.0 mg Sedation: Moderate Sedation Fentanyl 1 mL (50 mcg) DOS: 11/10/2021  Performed by: Gaspar Cola, MD  Purpose: Diagnostic/Therapeutic Indications: Lumbar radicular pain of intraspinal etiology of more than 4 weeks that has failed to respond to conservative therapy and is severe enough to impact quality of life or function. 1. DDD (degenerative disc disease), lumbosacral   2. Chronic low back pain (1ry area of Pain) (Bilateral) (L>R) w/o sciatica   3. Chronic lower extremity pain (2ry area of Pain) (Bilateral) (L>R)   4. 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 Lumbar facet hypertrophy (L4-5) (Bilateral)   6. Lumbosacral radiculopathy at L5 (Bilateral) (L>R)   7. Lumbosacral radiculopathy at S1 (Bilateral) (L>R)   8. Weakness of lower extremity (Left)    NAS-11 Pain score:   Pre-procedure: 10-Worst pain ever/10   Post-procedure: 0-No pain/10      Effectiveness:  Initial hour after procedure:   ***. Subsequent 4-6 hours post-procedure:   ***. Analgesia past initial 6 hours:   ***. Ongoing improvement:  Analgesic:  *** Function:    ***    ROM:    ***  Pharmacotherapy Assessment  Analgesic: Hydrocodone/APAP 5/325, 1 tab p.o.  4 times daily (#12) (last filled 08/28/2021) MME/day: 20 mg/day   Monitoring: [place] PMP: PDMP reviewed during this encounter.       Pharmacotherapy: No side-effects or adverse reactions reported. Compliance: No problems identified. Effectiveness: Clinically acceptable.  No notes on file  No results found for: "CBDTHCR" No results found for: "D8THCCBX" No results found for: "D9THCCBX"  UDS:  Summary  Date Value Ref Range Status  12/29/2020 Note  Final    Comment:    ==================================================================== Compliance Drug Analysis, Ur ==================================================================== Test                             Result       Flag       Units  Drug Present and Declared for Prescription Verification   Butalbital                     PRESENT      EXPECTED   Gabapentin                     PRESENT      EXPECTED  Drug Present not Declared for Prescription Verification   Carboxy-THC                    96           UNEXPECTED ng/mg creat    Carboxy-THC is a metabolite of tetrahydrocannabinol (THC). Source of    THC is most commonly herbal marijuana or marijuana-based products,    but THC is also present in a scheduled prescription medication.    Trace amounts of THC can be present in hemp and cannabidiol (CBD)    products. This test is not intended to distinguish between delta-9-    tetrahydrocannabinol, the predominant form of THC in most herbal or    marijuana-based products, and delta-8-tetrahydrocannabinol.  Drug Absent but Declared for Prescription Verification   Acetaminophen                  Not Detected UNEXPECTED    Acetaminophen, as indicated in the declared medication list, is not    always detected even when used as directed.  ==================================================================== Test                      Result    Flag   Units      Ref Range   Creatinine              54               mg/dL       >=20 ==================================================================== Declared Medications:  The flagging and interpretation on this report are based on the  following declared medications.  Unexpected results may arise from  inaccuracies in the declared medications.   **Note: The testing scope of this panel includes these medications:   Butalbital (Fioricet)  Gabapentin (Neurontin)   **Note: The testing scope of this panel does not include small to  moderate amounts of these reported medications:   Acetaminophen (Fioricet)   **Note: The testing scope of this panel does not include the  following reported medications:   Albuterol (Ventolin HFA)  Amlodipine (Norvasc)  Caffeine (Fioricet)  Cyanocobalamin  Esomeprazole (Nexium)  Lisinopril (Zestril) ==================================================================== For clinical consultation, please call [phone]. ====================================================================       ROS  Constitutional: Denies any fever or chills Gastrointestinal: No reported hemesis, hematochezia, vomiting, or acute GI distress Musculoskeletal: Denies any acute onset joint swelling, redness, loss of ROM, or weakness Neurological: No reported episodes of acute onset apraxia, aphasia, dysarthria, agnosia, amnesia, paralysis, loss of coordination, or loss of consciousness  Medication Review  Vitamin D3, albuterol, amLODipine, ergocalciferol, gabapentin, and liraglutide  History Review  Allergy: Ms. Horiuchi is allergic to ace inhibitors. Drug: Ms. Sterba  reports current drug use. Frequency: 1.00 time per week. Drug: Marijuana. Alcohol:  reports current alcohol use. Tobacco:  reports that she has been smoking cigarettes. She has a 6.00 pack-year smoking history. She has never used smokeless tobacco. Social: Ms. Baney  reports that she has been smoking cigarettes. She has a 6.00 pack-year smoking history. She has never used  smokeless tobacco. She reports current alcohol use. She reports current drug use. Frequency: 1.00 time per week. Drug: Marijuana. Medical:  has a past medical history of Allergy, Anemia, Asthma, Cancer ([place]), Depression, GERD (gastroesophageal reflux disease), Headache, Hypertension, Pancreatitis, Pilonidal cyst, and Pneumonia (2018). Surgical: Ms. Sundberg  has a past surgical history that includes Tubal ligation; epigastric hernia repair (N/A, 04/18/2015); Hernia repair; Cholecystectomy (10/21/2015); ERCP (N/A, 12/21/2016); Cyst excision; Cholecystectomy (Bilateral); Cervical conization w/bx (N/A, 03/23/2017); Radical hysterectomy (04/14/2017); Salpingectomy (Bilateral, 04/14/2017); Sentinel lymph node biopsy; Abdominal hysterectomy; Lysis of adhesion (N/A, 01/01/2020); Laparoscopic salpingo oophorectomy (Right, 01/01/2020); Colonoscopy with propofol (N/A, 12/06/2020); Esophagogastroduodenoscopy (12/06/2020); Givens capsule study (N/A, 12/08/2020); and VISCERAL ANGIOGRAPHY (N/A, 12/11/2020). Family: family history includes Cancer in her maternal grandfather, maternal grandmother, paternal grandfather, paternal grandmother, and paternal uncle; Gallstones in her mother; Gout in her father; Hypertension in her father and mother.  Laboratory Chemistry Profile   Renal Lab Results  Component Value Date   BUN 7 12/12/2020   CREATININE 0.84 12/12/2020   BCR 8 (L) 11/28/2020   GFRAA >60 05/05/2019   GFRNONAA >60 12/12/2020    Hepatic Lab Results  Component Value Date   AST 18 12/04/2020   ALT 19 12/04/2020   ALBUMIN 3.6 12/04/2020   ALKPHOS 59 12/04/2020   HCVAB 0.1 12/16/2016   LIPASE 87 (H) 12/04/2020    Electrolytes Lab Results  Component Value Date   NA 138 12/12/2020   K 3.6 12/12/2020   CL 107 12/12/2020   CALCIUM 8.3 (L) 12/12/2020   MG 2.2 12/29/2020   PHOS 3.4 12/08/2020    Bone Lab Results  Component Value Date   25OHVITD1 8.9 (L) 12/29/2020   25OHVITD2 <1.0 12/29/2020   25OHVITD3  8.9 12/29/2020    Inflammation (CRP: Acute Phase) (ESR: Chronic Phase) Lab Results  Component Value Date   CRP 8 12/29/2020   ESRSEDRATE 62 (H) 12/29/2020         Note: Above Lab results reviewed.  Recent Imaging Review  DG PAIN CLINIC C-ARM 1-60 MIN NO REPORT Fluoro was used, but no Radiologist interpretation will be provided.  Please refer to "NOTES" tab for provider progress note. Note: Reviewed        Physical Exam  General appearance: Well nourished, well developed, and well hydrated. In no apparent acute distress Mental status: Alert, oriented x 3 (person, place, & time)       Respiratory: No evidence of acute respiratory distress Eyes: PERLA Vitals: LMP 03/18/2017 (Exact Date)  BMI: Estimated body mass index is 36.61 kg/m as calculated from the following:   Height as of 11/10/21: _0  (1.651 m).   Weight as of 11/10/21: 220 lb (99.8 kg).  Ideal: Ideal body weight: 57 kg (125 lb 10.6 oz) Adjusted ideal body weight: 74.1 kg (163 lb 6.4 oz)  Assessment   Diagnosis Status  No diagnosis found. Controlled Controlled Controlled   Updated Problems: No problems updated.   Plan of Care  Problem-specific:  No problem-specific Assessment & Plan notes found for this encounter.  Ms. Kerstin Crusoe Genet has a current medication list which includes the following long-term medication(s): albuterol, amlodipine, vitamin d3, ergocalciferol, and victoza.  Pharmacotherapy (Medications Ordered): No orders of the defined types were placed in this encounter.  Orders:  No orders of the defined types were placed in this encounter.  Follow-up plan:   No follow-ups on file.     Interventional Therapies  Risk  Complexity Considerations:   Estimated body mass index is 36.61 kg/m as calculated from the following:   Height as of this encounter: _0  (1.651 m).   Weight as of this encounter: 220 lb (99.8 kg). WNL   Planned  Pending:   Diagnostic left L4-5 LESI #1   Prescriptions to replace her vitamin D deficiency were sent to the pharmacy.   Under consideration:   Diagnostic bilateral lumbar facet MBB #1  Diagnostic bilateral sacroiliac joint block #1  Diagnostic bilateral IA hip joint injection #1  Diagnostic bilateral trochanteric bursa injection #1    Completed:   PT referral (12/29/2020).  She describes having attended twice a week for 2-3 weeks for a total of 4-5 sessions.  Not only did the physical therapy did not help the pain but it actually worsened it.   Completed by Girtha Hake, MD [facility] PMR):   Diagnostic/therapeutic bilateral L5 TFESI x2 (06/04/2019 & 08/02/2019) (no benefit & painful)    Therapeutic  Palliative (PRN) options:   None established      Recent Visits Date Type Provider Dept  11/10/21 Procedure visit Milinda Pointer, MD [facility]  10/28/21 Office Visit Milinda Pointer, MD [facility]  Showing recent visits within past 90 days and meeting all other requirements Future Appointments Date Type Provider Dept  11/24/21 Appointment Milinda Pointer, MD [facility]  Showing future appointments within next 90 days and meeting all other requirements  I discussed the assessment and treatment plan with the patient. The patient was provided an opportunity to ask questions and all were answered. The patient agreed with the plan and demonstrated an understanding of the instructions.  Patient advised to call back or seek an in-person evaluation if the symptoms or condition worsens.  Duration of encounter: *** minutes.  Total time on encounter, as per AMA guidelines included both the face-to-face and non-face-to-face time personally spent by the physician and/or other qualified health care professional(s) on the day of the encounter (includes time in activities that require the physician or other qualified health care professional and does not include time in activities normally  performed by clinical staff). Physician's time may include the following activities when performed: preparing to see the patient (eg, review of tests, pre-charting review of records) obtaining and/or reviewing separately obtained history performing a medically appropriate examination and/or evaluation counseling and educating the patient/family/caregiver ordering medications, tests, or procedures referring and communicating with other health care professionals (when not separately reported) documenting clinical information in the electronic or other health record independently interpreting results (not separately reported) and communicating results to the patient/ family/caregiver care coordination (not separately reported)  Note by: Gaspar Cola, MD Date: 11/24/2021; Time: 3:46 PM

## 2021-11-24 ENCOUNTER — Ambulatory Visit: Payer: Medicaid Other | Attending: Pain Medicine | Admitting: Pain Medicine

## 2021-11-24 ENCOUNTER — Encounter: Payer: Self-pay | Admitting: Pain Medicine

## 2021-11-24 VITALS — BP 163/103 | HR 98 | Temp 98.2°F | Resp 16 | Ht 65.0 in | Wt 219.0 lb

## 2021-11-24 DIAGNOSIS — G8929 Other chronic pain: Secondary | ICD-10-CM | POA: Diagnosis present

## 2021-11-24 DIAGNOSIS — M79605 Pain in left leg: Secondary | ICD-10-CM | POA: Insufficient documentation

## 2021-11-24 DIAGNOSIS — R29898 Other symptoms and signs involving the musculoskeletal system: Secondary | ICD-10-CM | POA: Insufficient documentation

## 2021-11-24 DIAGNOSIS — R937 Abnormal findings on diagnostic imaging of other parts of musculoskeletal system: Secondary | ICD-10-CM | POA: Insufficient documentation

## 2021-11-24 DIAGNOSIS — M79604 Pain in right leg: Secondary | ICD-10-CM | POA: Insufficient documentation

## 2021-11-24 DIAGNOSIS — M25552 Pain in left hip: Secondary | ICD-10-CM | POA: Insufficient documentation

## 2021-11-24 DIAGNOSIS — M545 Low back pain, unspecified: Secondary | ICD-10-CM | POA: Insufficient documentation

## 2021-11-24 DIAGNOSIS — M25551 Pain in right hip: Secondary | ICD-10-CM | POA: Diagnosis present

## 2021-11-24 NOTE — Patient Instructions (Addendum)
____________________________________________________________________________________________  Patient Information update  To: All of our patients.  Re: Name change.  It has been made official that our current name, "[facility]"   will soon be changed to "[facility]".   The purpose of this change is to eliminate any confusion created by the concept of our practice being a "[facility]ication [facility]". In the past this has led to the misconception that we treat pain primarily by the use of prescription medications.  Nothing can be farther from the truth.   Understanding PAIN MANAGEMENT: To further understand what our practice does, you first have to understand that "Pain Management" is a subspecialty that requires additional training once a physician has completed their specialty training, which can be in either Anesthesia, Neurology, Psychiatry, or Physical Medicine and Rehabilitation (PMR). Each one of these contributes to the final approach taken by each physician to the management of their patient's pain. To be a "Pain Management Specialist" you must have first completed one of the specialty trainings below.  Anesthesiologists - trained in clinical pharmacology and interventional techniques such as nerve blockade and regional as well as central neuroanatomy. They are trained to block pain before, during, and after surgical interventions.  Neurologists - trained in the diagnosis and pharmacological treatment of complex neurological conditions, such as Multiple Sclerosis, Parkinson's, spinal cord injuries, and other systemic conditions that may be associated with symptoms that may include but are not limited to pain. They tend to rely primarily on the treatment of chronic pain using prescription medications.  Psychiatrist - trained in conditions affecting the psychosocial wellbeing  of patients including but not limited to depression, anxiety, schizophrenia, personality disorders, addiction, and other substance use disorders that may be associated with chronic pain. They tend to rely primarily on the treatment of chronic pain using prescription medications.   Physical Medicine and Rehabilitation (PMR) physicians, also known as physiatrists - trained to treat a wide variety of medical conditions affecting the brain, spinal cord, nerves, bones, joints, ligaments, muscles, and tendons. Their training is primarily aimed at treating patients that have suffered injuries that have caused severe physical impairment. Their training is primarily aimed at the physical therapy and rehabilitation of those patients. They may also work alongside orthopedic surgeons or neurosurgeons using their expertise in assisting surgical patients to recover after their surgeries.  INTERVENTIONAL PAIN MANAGEMENT is sub-subspecialty of Pain Management.  Our physicians are Board-certified in Anesthesia, Pain Management, and Interventional Pain Management.  This meaning that not only have they been trained and Board-certified in their specialty of Anesthesia, and subspecialty of Pain Management, but they have also received further training in the sub-subspecialty of Interventional Pain Management, in order to become Board-certified as INTERVENTIONAL PAIN MANAGEMENT SPECIALIST.    Mission: Our goal is to use our skills in  [place] as alternatives to the chronic use of prescription opioid medications for the treatment of pain. To make this more clear, we have changed our name to reflect what we do and offer. We will continue to offer medication management assessment and recommendations, but we will not be taking over any patient's medication management.  ____________________________________________________________________________________________    ______________________________________________________________________  Preparing for your procedure  During your procedure appointment there will be: No Prescription Refills. No disability issues to discussed. No medication changes or discussions.  Instructions: Food intake: Avoid eating anything solid for at least 8 hours prior to your procedure. Clear liquid  intake: You may take clear liquids such as water up to 2 hours prior to your procedure. (No carbonated drinks. No soda.) Transportation: Unless otherwise stated by your physician, bring a driver. Morning Medicines: Except for blood thinners, take all of your other morning medications with a sip of water. Make sure to take your heart and blood pressure medicines. If your blood pressure's lower number is above 100, the case will be rescheduled. Blood thinners: If you take a blood thinner, but were not instructed to stop it, call our office [phone] and ask to talk to a nurse. Not stopping a blood thinner prior to certain procedures could lead to serious complications. Diabetics on insulin: Notify the staff so that you can be scheduled 1st case in the morning. If your diabetes requires high dose insulin, take only  of your normal insulin dose the morning of the procedure and notify the staff that you have done so. Preventing infections: Shower with an antibacterial soap the morning of your procedure.  Build-up your immune system: Take 1000 mg of Vitamin C with every meal (3 times a day) the day prior to your procedure. Antibiotics: Inform the nursing staff if you are taking any antibiotics or if you have any conditions that may require antibiotics prior to procedures. (Example: recent joint implants)   Pregnancy: If you are pregnant make sure to notify the nursing staff. Not doing so may result in injury to the fetus, including death.  Sickness: If you have a cold, fever, or any active infections, call and cancel or reschedule your  procedure. Receiving steroids while having an infection may result in complications. Arrival: You must be in the facility at least 30 minutes prior to your scheduled procedure. Tardiness: Your scheduled time is also the cutoff time. If you do not arrive at least 15 minutes prior to your procedure, you will be rescheduled.  Children: Do not bring any children with you. Make arrangements to keep them home. Dress appropriately: There is always a possibility that your clothing may get soiled. Avoid long dresses. Valuables: Do not bring any jewelry or valuables.  Reasons to call and reschedule or cancel your procedure: (Following these recommendations will minimize the risk of a serious complication.) Surgeries: Avoid having procedures within 2 weeks of any surgery. (Avoid for 2 weeks before or after any surgery). Flu Shots: Avoid having procedures within 2 weeks of a flu shots or . (Avoid for 2 weeks before or after immunizations). Barium: Avoid having a procedure within 7-10 days after having had a radiological study involving the use of radiological contrast. (Myelograms, Barium swallow or enema study). Heart attacks: Avoid any elective procedures or surgeries for the initial 6 months after a "Myocardial Infarction" (Heart Attack). Blood thinners: It is imperative that you stop these medications before procedures. Let us know if you if you take any blood thinner.  Infection: Avoid procedures during or within two weeks of an infection (including chest colds or gastrointestinal problems). Symptoms associated with infections include: Localized redness, fever, chills, night sweats or profuse sweating, burning sensation when voiding, cough, congestion, stuffiness, runny nose, sore throat, diarrhea, nausea, vomiting, cold or Flu symptoms, recent or current infections. It is specially important if the infection is over the area that we intend to treat. Heart and lung problems: Symptoms that may suggest an  active cardiopulmonary problem include: cough, chest pain, breathing difficulties or shortness of breath, dizziness, ankle swelling, uncontrolled high or unusually low blood pressure, and/or palpitations. If you are  experiencing any of these symptoms, cancel your procedure and contact your primary care physician for an evaluation.  Remember:  Regular Business hours are:  Monday to Thursday 8:00 AM to 4:00 PM  Provider's Schedule: Milinda Pointer, MD:  Procedure days: Tuesday and Thursday 7:30 AM to 4:00 PM  Gillis Santa, MD:  Procedure days: Monday and Wednesday 7:30 AM to 4:00 PM  ______________________________________________________________________  ____________________________________________________________________________________________  General Risks and Possible Complications  Patient Responsibilities: It is important that you read this as it is part of your informed consent. It is our duty to inform you of the risks and possible complications associated with treatments offered to you. It is your responsibility as a patient to read this and to ask questions about anything that is not clear or that you believe was not covered in this document.  Patient's Rights: You have the right to refuse treatment. You also have the right to change your mind, even after initially having agreed to have the treatment done. However, under this last option, if you wait until the last second to change your mind, you may be charged for the materials used up to that point.  Introduction: Medicine is not an Chief Strategy Officer. Everything in Medicine, including the lack of treatment(s), carries the potential for danger, harm, or loss (which is by definition: Risk). In Medicine, a complication is a secondary problem, condition, or disease that can aggravate an already existing one. All treatments carry the risk of possible complications. The fact that a side effects or complications occurs, does not imply that  the treatment was conducted incorrectly. It must be clearly understood that these can happen even when everything is done following the highest safety standards.  No treatment: You can choose not to proceed with the proposed treatment alternative. The "PRO(s)" would include: avoiding the risk of complications associated with the therapy. The "CON(s)" would include: not getting any of the treatment benefits. These benefits fall under one of three categories: diagnostic; therapeutic; and/or palliative. Diagnostic benefits include: getting information which can ultimately lead to improvement of the disease or symptom(s). Therapeutic benefits are those associated with the successful treatment of the disease. Finally, palliative benefits are those related to the decrease of the primary symptoms, without necessarily curing the condition (example: decreasing the pain from a flare-up of a chronic condition, such as incurable terminal cancer).  General Risks and Complications: These are associated to most interventional treatments. They can occur alone, or in combination. They fall under one of the following six (6) categories: no benefit or worsening of symptoms; bleeding; infection; nerve damage; allergic reactions; and/or death. No benefits or worsening of symptoms: In Medicine there are no guarantees, only probabilities. No healthcare provider can ever guarantee that a medical treatment will work, they can only state the probability that it may. Furthermore, there is always the possibility that the condition may worsen, either directly, or indirectly, as a consequence of the treatment. Bleeding: This is more common if the patient is taking a blood thinner, either prescription or over the counter (example: Goody Powders, Fish oil, Aspirin, Garlic, etc.), or if suffering a condition associated with impaired coagulation (example: Hemophilia, cirrhosis of the liver, low platelet counts, etc.). However, even if you do  not have one on these, it can still happen. If you have any of these conditions, or take one of these drugs, make sure to notify your treating physician. Infection: This is more common in patients with a compromised immune system, either due to disease (  example: diabetes, cancer, human immunodeficiency virus [HIV], etc.), or due to medications or treatments (example: therapies used to treat cancer and rheumatological diseases). However, even if you do not have one on these, it can still happen. If you have any of these conditions, or take one of these drugs, make sure to notify your treating physician. Nerve Damage: This is more common when the treatment is an invasive one, but it can also happen with the use of medications, such as those used in the treatment of cancer. The damage can occur to small secondary nerves, or to large primary ones, such as those in the spinal cord and brain. This damage may be temporary or permanent and it may lead to impairments that can range from temporary numbness to permanent paralysis and/or brain death. Allergic Reactions: Any time a substance or material comes in contact with our body, there is the possibility of an allergic reaction. These can range from a mild skin rash (contact dermatitis) to a severe systemic reaction (anaphylactic reaction), which can result in death. Death: In general, any medical intervention can result in death, most of the time due to an unforeseen complication. ____________________________________________________________________________________________   ____________________________________________________________________________________________  Muscle Spasms & Cramps  Cause(s):  Most common - vitamin and/or electrolyte (calcium, potassium, sodium, etc.) deficiencies. Post procedure - steroids can make your kidneys excrete electrolytes. If you happen to have been borderline low on your electrolytes, it may temporarily triggering cramps &  spasms.  Possible triggers: Sweating - causes loss of electrolytes thru the skin. Steroids - causes loss of electrolytes thru the urine.  Treatment: Gatorade (or any other electrolyte-replenishing drink) - Take 1, 8 oz glass with each meal (3 times a day). OTC (over-the-counter) Magnesium 400 to 500 mg - Take 1 tablet twice a day (one with breakfast and one before bedtime). If you have kidney problems, talk to your primary care physician before taking any Magnesium. Tonic Water with quinine - Take 1, 8 oz glass before bedtime.   ____________________________________________________________________________________________

## 2021-11-26 ENCOUNTER — Telehealth: Payer: Self-pay | Admitting: Pain Medicine

## 2021-11-26 NOTE — Telephone Encounter (Signed)
  PT stated that she spoke with Dr Dossie Arbour during her appt on 11-25-21. Pt asked if he could give her medication for her back pain. PT stated that she was told to go see her PCP doctor. PT stated that she went to her PCP doctor on today. PCP doctor told patient that pain doctor is suppose to prescribed medication. PT stated that the Gabapentin isn't working and she discuss that with doctor on 11-25-21.

## 2021-11-26 NOTE — Telephone Encounter (Signed)
PT stated that she spoke with Dr Dossie Arbour during her appt on 11-25-21. Pt asked if he could give her medication for her back pain. PT stated that she was told to go see her PCP doctor. PT stated that she went to her PCP doctor on today. PCP doctor told patient that pain doctor is suppose to prescribed medication. PT stated that the Gabapentin isn't working and she discuss that with doctor on 11-25-21.

## 2021-11-26 NOTE — Telephone Encounter (Signed)
Called and talked with patient. She is wanting medication for her back and states that Gabapentin is not helping her. She had an appt a few days ago and MRI and another procedure was ordered. Informed patient that Dr Nav. Discussed with her the plan on 10/31. Patient with understanding and will cont taking Gabapentin

## 2021-12-02 ENCOUNTER — Ambulatory Visit
Admission: RE | Admit: 2021-12-02 | Discharge: 2021-12-02 | Disposition: A | Discharge status: Home / Self Care | Payer: Medicaid Other | Source: Ambulatory Visit | Attending: Pain Medicine | Admitting: Pain Medicine

## 2021-12-02 DIAGNOSIS — M79604 Pain in right leg: Secondary | ICD-10-CM | POA: Diagnosis present

## 2021-12-02 DIAGNOSIS — R29898 Other symptoms and signs involving the musculoskeletal system: Secondary | ICD-10-CM | POA: Diagnosis present

## 2021-12-02 DIAGNOSIS — M79605 Pain in left leg: Secondary | ICD-10-CM | POA: Diagnosis present

## 2021-12-02 DIAGNOSIS — M25552 Pain in left hip: Secondary | ICD-10-CM | POA: Insufficient documentation

## 2021-12-02 DIAGNOSIS — M25551 Pain in right hip: Secondary | ICD-10-CM | POA: Diagnosis present

## 2021-12-02 DIAGNOSIS — M545 Low back pain, unspecified: Secondary | ICD-10-CM | POA: Diagnosis present

## 2021-12-02 DIAGNOSIS — G8929 Other chronic pain: Secondary | ICD-10-CM | POA: Insufficient documentation

## 2021-12-15 ENCOUNTER — Ambulatory Visit: Payer: Medicaid Other | Attending: Pain Medicine | Admitting: Pain Medicine

## 2021-12-15 ENCOUNTER — Ambulatory Visit
Admission: RE | Admit: 2021-12-15 | Discharge: 2021-12-15 | Disposition: A | Discharge status: Home / Self Care | Payer: Medicaid Other | Source: Ambulatory Visit | Attending: Pain Medicine | Admitting: Pain Medicine

## 2021-12-15 ENCOUNTER — Encounter: Payer: Self-pay | Admitting: Pain Medicine

## 2021-12-15 VITALS — BP 127/84 | HR 97 | Temp 97.3°F | Resp 15 | Ht 65.0 in | Wt 220.0 lb

## 2021-12-15 DIAGNOSIS — M48061 Spinal stenosis, lumbar region without neurogenic claudication: Secondary | ICD-10-CM | POA: Insufficient documentation

## 2021-12-15 DIAGNOSIS — M79604 Pain in right leg: Secondary | ICD-10-CM | POA: Insufficient documentation

## 2021-12-15 DIAGNOSIS — M5137 Other intervertebral disc degeneration, lumbosacral region: Secondary | ICD-10-CM | POA: Diagnosis present

## 2021-12-15 DIAGNOSIS — R937 Abnormal findings on diagnostic imaging of other parts of musculoskeletal system: Secondary | ICD-10-CM | POA: Diagnosis present

## 2021-12-15 DIAGNOSIS — R29898 Other symptoms and signs involving the musculoskeletal system: Secondary | ICD-10-CM | POA: Insufficient documentation

## 2021-12-15 DIAGNOSIS — M5441 Lumbago with sciatica, right side: Secondary | ICD-10-CM | POA: Insufficient documentation

## 2021-12-15 DIAGNOSIS — M5417 Radiculopathy, lumbosacral region: Secondary | ICD-10-CM | POA: Diagnosis present

## 2021-12-15 DIAGNOSIS — M5442 Lumbago with sciatica, left side: Secondary | ICD-10-CM | POA: Insufficient documentation

## 2021-12-15 DIAGNOSIS — M79605 Pain in left leg: Secondary | ICD-10-CM | POA: Insufficient documentation

## 2021-12-15 DIAGNOSIS — M545 Low back pain, unspecified: Secondary | ICD-10-CM | POA: Insufficient documentation

## 2021-12-15 DIAGNOSIS — M25551 Pain in right hip: Secondary | ICD-10-CM | POA: Diagnosis present

## 2021-12-15 DIAGNOSIS — M25552 Pain in left hip: Secondary | ICD-10-CM | POA: Insufficient documentation

## 2021-12-15 DIAGNOSIS — G8929 Other chronic pain: Secondary | ICD-10-CM | POA: Insufficient documentation

## 2021-12-15 MED ORDER — LIDOCAINE HCL 2 % IJ SOLN
20.0000 mL | Freq: Once | INTRAMUSCULAR | Status: AC
  Administered 2021-12-15: 400 mg

## 2021-12-15 MED ORDER — SODIUM CHLORIDE 0.9% FLUSH
2.0000 mL | Freq: Once | INTRAVENOUS | Status: AC
  Administered 2021-12-15: 2 mL

## 2021-12-15 MED ORDER — LACTATED RINGERS IV SOLN: Freq: Once | INTRAVENOUS | Status: AC

## 2021-12-15 MED ORDER — ROPIVACAINE HCL 2 MG/ML IJ SOLN
2.0000 mL | Freq: Once | INTRAMUSCULAR | Status: AC
  Administered 2021-12-15: 2 mL via EPIDURAL

## 2021-12-15 MED ORDER — IOHEXOL 180 MG/ML  SOLN
10.0000 mL | Freq: Once | INTRAMUSCULAR | Status: AC
  Administered 2021-12-15: 10 mL via EPIDURAL

## 2021-12-15 MED ORDER — FENTANYL CITRATE (PF) 100 MCG/2ML IJ SOLN
25.0000 ug | INTRAMUSCULAR | Status: DC | PRN
  Administered 2021-12-15: 50 ug via INTRAVENOUS

## 2021-12-15 MED ORDER — ROPIVACAINE HCL 2 MG/ML IJ SOLN
INTRAMUSCULAR | Status: AC
  Filled 2021-12-15: qty 20

## 2021-12-15 MED ORDER — LACTATED RINGERS IV SOLN: Freq: Once | INTRAVENOUS | Status: DC

## 2021-12-15 MED ORDER — LIDOCAINE HCL 2 % IJ SOLN
INTRAMUSCULAR | Status: AC
  Filled 2021-12-15: qty 20

## 2021-12-15 MED ORDER — TRIAMCINOLONE ACETONIDE 40 MG/ML IJ SUSP
INTRAMUSCULAR | Status: AC
  Filled 2021-12-15: qty 1

## 2021-12-15 MED ORDER — FENTANYL CITRATE (PF) 100 MCG/2ML IJ SOLN
INTRAMUSCULAR | Status: AC
  Filled 2021-12-15: qty 2

## 2021-12-15 MED ORDER — SODIUM CHLORIDE (PF) 0.9 % IJ SOLN
INTRAMUSCULAR | Status: AC
  Filled 2021-12-15: qty 10

## 2021-12-15 MED ORDER — PENTAFLUOROPROP-TETRAFLUOROETH EX AERO
INHALATION_SPRAY | Freq: Once | CUTANEOUS | Status: DC
  Filled 2021-12-15: qty 116

## 2021-12-15 MED ORDER — MIDAZOLAM HCL 5 MG/5ML IJ SOLN
INTRAMUSCULAR | Status: AC
  Filled 2021-12-15: qty 5

## 2021-12-15 MED ORDER — MIDAZOLAM HCL 5 MG/5ML IJ SOLN
0.5000 mg | Freq: Once | INTRAMUSCULAR | Status: AC
  Administered 2021-12-15: 2 mg via INTRAVENOUS
  Filled 2021-12-15: qty 2

## 2021-12-15 MED ORDER — TRIAMCINOLONE ACETONIDE 40 MG/ML IJ SUSP
40.0000 mg | Freq: Once | INTRAMUSCULAR | Status: AC
  Administered 2021-12-15: 40 mg

## 2021-12-15 NOTE — Patient Instructions (Addendum)
Pain Management Discharge Instructions  General Discharge Instructions :  If you need to reach your doctor call: Monday-Friday 8:00 am - 4:00 pm at [phone] or toll free [phone].  After clinic hours [phone] to have operator reach doctor.  Bring all of your medication bottles to all your appointments in the pain clinic.  To cancel or reschedule your appointment with Pain Management please remember to call 24 hours in advance to avoid a fee.  Refer to the educational materials which you have been given on: General Risks, I had my Procedure. Discharge Instructions, Post Sedation.  Post Procedure Instructions:  The drugs you were given will stay in your system until tomorrow, so for the next 24 hours you should not drive, make any legal decisions or drink any alcoholic beverages.  You may eat anything you prefer, but it is better to start with liquids then soups and crackers, and gradually work up to solid foods.  Please notify your doctor immediately if you have any unusual bleeding, trouble breathing or pain that is not related to your normal pain.  Depending on the type of procedure that was done, some parts of your body may feel week and/or numb.  This usually clears up by tonight or the next day.  Walk with the use of an assistive device or accompanied by an adult for the 24 hours.  You may use ice on the affected area for the first 24 hours.  Put ice in a Ziploc bag and cover with a towel and place against area 15 minutes on 15 minutes off.  You may switch to heat after 24 hours. ____________________________________________________________________________________________  Virtual Visits   What is a "Virtual Visit"? It is a Metallurgist (medical visit) that takes place on real time (NOT TEXT or E-MAIL) over the telephone or computer device (desktop, laptop, tablet, smart phone, etc.). It allows for more location flexibility between the patient and  the healthcare provider.  Who decides when these types of visits will be used? The physician.  Who is eligible for these types of visits? Only those patients that can be reliably reached over the telephone.  What do you mean by reliably? We do not have time to call everyone multiple times, therefore those that tend to screen calls and then call back later are not suitable candidates for this system. We understand how people are reluctant to pickup on "unknown" calls, therefore, we suggest adding our telephone numbers to your list of "CONTACT(s)". This way, you should be able to readily identify our calls when you receive one. All of our numbers are available below.   Who is not eligible? This option is not available for medication management encounters, specially for controlled substances. Patients on pain medications that fall under the category of controlled substances have to come in for "Face-to-Face" encounters. This is required for mandatory monitoring of these substances. You may be asked to provide a sample for an unannounced urine drug screening test (UDS), and we will need to count your pain pills. Not bringing your pills to be counted may result in no refill. Obviously, neither one of these can be done over the phone.  When will this type of visits be used? You can request a virtual visit whenever you are physically unable to attend a regular appointment. The decision will be made by the physician (or healthcare provider) on a case by case basis.   At what time will I be called? This is an excellent question. The  providers will try to call you whenever they have time available. Do not expect to be called at any specific time. The secretaries will assign you a time for your virtual visit appointment, but this is done simply to keep a list of those patients that need to be called, but not for the purpose of keeping a time schedule. Be advised that the call may come in anytime during the  day, between the hours of 8:00 AM and 8::00 PM, depending on provider availability. We do understand that the system is not perfect. If you are unable to be available that day on a moments notice, then request an "in-person" appointment rather than a "virtual visit".  Can I request my medication visits to be "Virtual"? Yes you may request it, but the decision is entirely up to the healthcare provider. Control substances require specific monitoring that requires Face-to-Face encounters. The number of encounters  and the extent of the monitoring is determined on a case by case basis.  Add a new contact to your smart phone and label it "PAIN CLINIC" Under this contact add the following numbers: Main: [phone] (Official Contact Number) Nurses: [phone] (These are outgoing only calling systems. Do not call this number.) Dr. Dossie Arbour: [phone] or [phone] (Outgoing calls only. Do not call this number.)  ____________________________________________________________________________________________    ____________________________________________________________________________________________  Post-Procedure Discharge Instructions  Instructions: Apply ice:  Purpose: This will minimize any swelling and discomfort after procedure.  When: Day of procedure, as soon as you get home. How: Fill a plastic sandwich bag with crushed ice. Cover it with a small towel and apply to injection site. How long: (15 min on, 15 min off) Apply for 15 minutes then remove x 15 minutes.  Repeat sequence on day of procedure, until you go to bed. Apply heat:  Purpose: To treat any soreness and discomfort from the procedure. When: Starting the next day after the procedure. How: Apply heat to procedure site starting the day following the procedure. How long: May continue to repeat daily, until discomfort goes away. Food intake: Start with clear liquids (like water) and advance to regular food, as  tolerated.  Physical activities: Keep activities to a minimum for the first 8 hours after the procedure. After that, then as tolerated. Driving: If you have received any sedation, be responsible and do not drive. You are not allowed to drive for 24 hours after having sedation. Blood thinner: (Applies only to those taking blood thinners) You may restart your blood thinner 6 hours after your procedure. Insulin: (Applies only to Diabetic patients taking insulin) As soon as you can eat, you may resume your normal dosing schedule. Infection prevention: Keep procedure site clean and dry. Shower daily and clean area with soap and water. Post-procedure Pain Diary: Extremely important that this be done correctly and accurately. Recorded information will be used to determine the next step in treatment. For the purpose of accuracy, follow these rules: Evaluate only the area treated. Do not report or include pain from an untreated area. For the purpose of this evaluation, ignore all other areas of pain, except for the treated area. After your procedure, avoid taking a long nap and attempting to complete the pain diary after you wake up. Instead, set your alarm clock to go off every hour, on the hour, for the initial 8 hours after the procedure. Document the duration of the numbing medicine, and the relief you are getting from it. Do not go to sleep  and attempt to complete it later. It will not be accurate. If you received sedation, it is likely that you were given a medication that may cause amnesia. Because of this, completing the diary at a later time may cause the information to be inaccurate. This information is needed to plan your care. Follow-up appointment: Keep your post-procedure follow-up evaluation appointment after the procedure (usually 2 weeks for most procedures, 6 weeks for radiofrequencies). DO NOT FORGET to bring you pain diary with you.   Expect: (What should I expect to see with my  procedure?) From numbing medicine (AKA: Local Anesthetics): Numbness or decrease in pain. You may also experience some weakness, which if present, could last for the duration of the local anesthetic. Onset: Full effect within 15 minutes of injected. Duration: It will depend on the type of local anesthetic used. On the average, 1 to 8 hours.  From steroids (Applies only if steroids were used): Decrease in swelling or inflammation. Once inflammation is improved, relief of the pain will follow. Onset of benefits: Depends on the amount of swelling present. The more swelling, the longer it will take for the benefits to be seen. In some cases, up to 10 days. Duration: Steroids will stay in the system x 2 weeks. Duration of benefits will depend on multiple posibilities including persistent irritating factors. Side-effects: If present, they may typically last 2 weeks (the duration of the steroids). Frequent: Cramps (if they occur, drink Gatorade and take over-the-counter Magnesium 450-500 mg once to twice a day); water retention with temporary weight gain; increases in blood sugar; decreased immune system response; increased appetite. Occasional: Facial flushing (red, warm cheeks); mood swings; menstrual changes. Uncommon: Long-term decrease or suppression of natural hormones; bone thinning. (These are more common with higher doses or more frequent use. This is why we prefer that our patients avoid having any injection therapies in other practices.)  Very Rare: Severe mood changes; psychosis; aseptic necrosis. From procedure: Some discomfort is to be expected once the numbing medicine wears off. This should be minimal if ice and heat are applied as instructed.  Call if: (When should I call?) You experience numbness and weakness that gets worse with time, as opposed to wearing off. New onset bowel or bladder incontinence. (Applies only to procedures done in the spine)  Emergency Numbers: [facility] business  hours (Monday - Thursday, 8:00 AM - 4:00 PM) (Friday, 9:00 AM - 12:00 Noon): [phone] After hours: [phone] NOTE: If you are having a problem and are unable connect with, or to talk to a provider, then go to your nearest urgent care or emergency department. If the problem is serious and urgent, please call 911. ____________________________________________________________________________________________

## 2021-12-15 NOTE — Progress Notes (Signed)
PROVIDER NOTE: Interpretation of information contained herein should be left to medically-trained personnel. Specific patient instructions are provided elsewhere under "Patient Instructions" section of medical record. This document was created in part using STT-dictation technology, any transcriptional errors that may result from this process are unintentional.  Patient: Cassidy Mathis Type: Established DOB: [id number] MRN: [id number] PCP: [facility]  Service: Procedure DOS: 12/15/2021 Setting: Ambulatory Location: Ambulatory outpatient facility Delivery: Face-to-face Provider: Gaspar Cola, MD Specialty: Interventional Pain Management Specialty designation: 09 Location: Outpatient facility Ref. Prov.: Milinda Pointer, MD    Primary Reason for Visit: Interventional Pain Management Treatment. CC: Back Pain   Procedure:           Type: Lumbar epidural steroid injection (LESI) (interlaminar) #2    Laterality: Left   Level:  L4-5 Level.  Imaging: Fluoroscopic guidance         Anesthesia: Local anesthesia (1-2% Lidocaine) Anxiolysis: None                 Sedation: Moderate Sedation                       DOS: 12/15/2021  Performed by: Gaspar Cola, MD  Purpose: Diagnostic/Therapeutic Indications: Lumbar radicular pain of intraspinal etiology of more than 4 weeks that has failed to respond to conservative therapy and is severe enough to impact quality of life or function. 1. DDD (degenerative disc disease), lumbosacral   2. Lumbosacral radiculopathy at L5 (Bilateral) (L>R)   3. Lumbosacral radiculopathy at S1 (Bilateral) (L>R)   4. Weakness of lower extremity (Left)   5. Chronic lower extremity pain (2ry area of Pain) (Bilateral) (L>R)   6. 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 Acute bilateral low back pain with bilateral sciatica   8. Abnormal MRI, lumbar spine (05/06/2019)    NAS-11 Pain score:   Pre-procedure: 10-Worst  pain ever/10   Post-procedure: 0-No pain/10      Position / Prep / Materials:  Position: Prone w/ head of the table raised (slight reverse trendelenburg) to facilitate breathing.  Prep solution: DuraPrep (Iodine Povacrylex [0.7% available iodine] and Isopropyl Alcohol, 74% w/w) Prep Area: Entire Posterior Lumbar Region from lower scapular tip down to mid buttocks area and from flank to flank. Materials:  Tray: Epidural tray Needle(s):  Type: Epidural needle (Tuohy) Gauge (G):  17 Length: Regular (3.5-in) Qty: 1  Pre-op H&P Assessment:  Cassidy Mathis is a 46 y.o. (year old), female patient, seen today for interventional treatment. She  has a past surgical history that includes Tubal ligation; epigastric hernia repair (N/A, 04/18/2015); Hernia repair; Cholecystectomy (10/21/2015); ERCP (N/A, 12/21/2016); Cyst excision; Cholecystectomy (Bilateral); Cervical conization w/bx (N/A, 03/23/2017); Radical hysterectomy (04/14/2017); Salpingectomy (Bilateral, 04/14/2017); Sentinel lymph node biopsy; Abdominal hysterectomy; Lysis of adhesion (N/A, 01/01/2020); Laparoscopic salpingo oophorectomy (Right, 01/01/2020); Colonoscopy with propofol (N/A, 12/06/2020); Esophagogastroduodenoscopy (12/06/2020); Givens capsule study (N/A, 12/08/2020); and VISCERAL ANGIOGRAPHY (N/A, 12/11/2020). Cassidy Mathis has a current medication list which includes the following prescription(s): albuterol, amlodipine, vitamin d3, gabapentin, victoza, and ergocalciferol, and the following Facility-Administered Medications: fentanyl and pentafluoroprop-tetrafluoroeth. Her primarily concern today is the Back Pain  Initial Vital Signs:  Pulse/HCG Rate: 97ECG Heart Rate: 79 (NSR) Temp: 97.6 F (36.4 C) Resp: 18 BP: (!) 151/96 SpO2: 99 %  BMI: Estimated body mass index is 36.61 kg/m as calculated from the following:   Height as of this encounter: '5\' 5"'$  (1.651 m).   Weight as of this encounter:  220 lb (99.8 kg).  Risk  Assessment: Allergies: Reviewed. She is allergic to ace inhibitors.  Allergy Precautions: None required Coagulopathies: Reviewed. None identified.  Blood-thinner therapy: None at this time Active Infection(s): Reviewed. None identified. Cassidy Mathis is afebrile  Site Confirmation: Cassidy Mathis was asked to confirm the procedure and laterality before marking the site Procedure checklist: Completed Consent: Before the procedure and under the influence of no sedative(s), amnesic(s), or anxiolytics, the patient was informed of the treatment options, risks and possible complications. To fulfill our ethical and legal obligations, as recommended by the American Medical Association's Code of Ethics, I have informed the patient of my clinical impression; the nature and purpose of the treatment or procedure; the risks, benefits, and possible complications of the intervention; the alternatives, including doing nothing; the risk(s) and benefit(s) of the alternative treatment(s) or procedure(s); and the risk(s) and benefit(s) of doing nothing. The patient was provided information about the general risks and possible complications associated with the procedure. These may include, but are not limited to: failure to achieve desired goals, infection, bleeding, organ or nerve damage, allergic reactions, paralysis, and death. In addition, the patient was informed of those risks and complications associated to Spine-related procedures, such as failure to decrease pain; infection (i.e.: Meningitis, epidural or intraspinal abscess); bleeding (i.e.: epidural hematoma, subarachnoid hemorrhage, or any other type of intraspinal or peri-dural bleeding); organ or nerve damage (i.e.: Any type of peripheral nerve, nerve root, or spinal cord injury) with subsequent damage to sensory, motor, and/or autonomic systems, resulting in permanent pain, numbness, and/or weakness of one or several areas of the body; allergic reactions; (i.e.:  anaphylactic reaction); and/or death. Furthermore, the patient was informed of those risks and complications associated with the medications. These include, but are not limited to: allergic reactions (i.e.: anaphylactic or anaphylactoid reaction(s)); adrenal axis suppression; blood sugar elevation that in diabetics may result in ketoacidosis or comma; water retention that in patients with history of congestive heart failure may result in shortness of breath, pulmonary edema, and decompensation with resultant heart failure; weight gain; swelling or edema; medication-induced neural toxicity; particulate matter embolism and blood vessel occlusion with resultant organ, and/or nervous system infarction; and/or aseptic necrosis of one or more joints. Finally, the patient was informed that Medicine is not an exact science; therefore, there is also the possibility of unforeseen or unpredictable risks and/or possible complications that may result in a catastrophic outcome. The patient indicated having understood very clearly. We have given the patient no guarantees and we have made no promises. Enough time was given to the patient to ask questions, all of which were answered to the patient's satisfaction. Cassidy Mathis has indicated that she wanted to continue with the procedure. Attestation: I, the ordering provider, attest that I have discussed with the patient the benefits, risks, side-effects, alternatives, likelihood of achieving goals, and potential problems during recovery for the procedure that I have provided informed consent. Date  Time: 12/15/2021  8:55 AM  Pre-Procedure Preparation:  Monitoring: As per clinic protocol. Respiration, ETCO2, SpO2, BP, heart rate and rhythm monitor placed and checked for adequate function Safety Precautions: Patient was assessed for positional comfort and pressure points before starting the procedure. Time-out: I initiated and conducted the "Time-out" before starting the  procedure, as per protocol. The patient was asked to participate by confirming the accuracy of the "Time Out" information. Verification of the correct person, site, and procedure were performed and confirmed by me, the nursing staff, and the patient. "Time-out"  conducted as per Joint Commission's Universal Protocol (UP.01.01.01). Time: 0941  Description/Narrative of Procedure:          Target: Epidural space via interlaminar opening, initially targeting the lower laminar border of the superior vertebral body. Region: Lumbar Approach: Percutaneous paravertebral  Rationale (medical necessity): procedure needed and proper for the diagnosis and/or treatment of the patient's medical symptoms and needs. Procedural Technique Safety Precautions: Aspiration looking for blood return was conducted prior to all injections. At no point did we inject any substances, as a needle was being advanced. No attempts were made at seeking any paresthesias. Safe injection practices and needle disposal techniques used. Medications properly checked for expiration dates. SDV (single dose vial) medications used. Description of the Procedure: Protocol guidelines were followed. The procedure needle was introduced through the skin, ipsilateral to the reported pain, and advanced to the target area. Bone was contacted and the needle walked caudad, until the lamina was cleared. The epidural space was identified using "loss-of-resistance technique" with 2-3 ml of PF-NaCl (0.9% NSS), in a 5cc LOR glass syringe.  Vitals:   12/15/21 0949 12/15/21 1002 12/15/21 1012 12/15/21 1022  BP: 128/78 127/84 130/80 127/84  Pulse:      Resp: '18 16 14 15  '$ Temp:    (!) 97.3 F (36.3 C)  TempSrc:      SpO2: 100% 97% 99% 99%  Weight:      Height:        Start Time: 0941 hrs. End Time: 0947 hrs.  Imaging Guidance (Spinal):          Type of Imaging Technique: Fluoroscopy Guidance (Spinal) Indication(s): Assistance in needle guidance and  placement for procedures requiring needle placement in or near specific anatomical locations not easily accessible without such assistance. Exposure Time: Please see nurses notes. Contrast: Before injecting any contrast, we confirmed that the patient did not have an allergy to iodine, shellfish, or radiological contrast. Once satisfactory needle placement was completed at the desired level, radiological contrast was injected. Contrast injected under live fluoroscopy. No contrast complications. See chart for type and volume of contrast used. Fluoroscopic Guidance: I was personally present during the use of fluoroscopy. "Tunnel Vision Technique" used to obtain the best possible view of the target area. Parallax error corrected before commencing the procedure. "Direction-depth-direction" technique used to introduce the needle under continuous pulsed fluoroscopy. Once target was reached, antero-posterior, oblique, and lateral fluoroscopic projection used confirm needle placement in all planes. Images permanently stored in EMR. Interpretation: I personally interpreted the imaging intraoperatively. Adequate needle placement confirmed in multiple planes. Appropriate spread of contrast into desired area was observed. No evidence of afferent or efferent intravascular uptake. No intrathecal or subarachnoid spread observed. Permanent images saved into the patient's record.  Antibiotic Prophylaxis:   Anti-infectives (From admission, onward)    None      Indication(s): None identified  Post-operative Assessment:  Post-procedure Vital Signs:  Pulse/HCG Rate: 9780 Temp: (!) 97.3 F (36.3 C) Resp: 15 BP: 127/84 SpO2: 99 %  EBL: None  Complications: No immediate post-treatment complications observed by team, or reported by patient.  Note: The patient tolerated the entire procedure well. A repeat set of vitals were taken after the procedure and the patient was kept under observation following  institutional policy, for this type of procedure. Post-procedural neurological assessment was performed, showing return to baseline, prior to discharge. The patient was provided with post-procedure discharge instructions, including a section on how to identify potential problems. Should any problems arise concerning  this procedure, the patient was given instructions to immediately contact us, at any time, without hesitation. In any case, we plan to contact the patient by telephone for a follow-up status report regarding this interventional procedure.  Comments:  No additional relevant information.  Plan of Care  Orders:  Orders Placed This Encounter  Procedures   Lumbar Epidural Injection    Scheduling Instructions:     Procedure: Interlaminar LESI L4-5     Laterality: Left     Sedation: Patient's choice     Timeframe: Today    Order Specific Question:   Where will this procedure be performed?    Answer:   [facility]   DG [facility] C-ARM 1-60 MIN NO REPORT    Intraoperative interpretation by procedural physician at [place].    Standing Status:   Standing    Number of Occurrences:   1    Order Specific Question:   Reason for exam:    Answer:   Assistance in needle guidance and placement for procedures requiring needle placement in or near specific anatomical locations not easily accessible without such assistance.   Informed Consent Details: Physician/Practitioner Attestation; Transcribe to consent form and obtain patient signature    Note: Always confirm laterality of pain with Cassidy Mathis, before procedure. Transcribe to consent form and obtain patient signature.    Order Specific Question:   Physician/Practitioner attestation of informed consent for procedure/surgical case    Answer:   I, the physician/practitioner, attest that I have discussed with the patient the benefits, risks, side effects, alternatives, likelihood of achieving goals and potential problems  during recovery for the procedure that I have provided informed consent.    Order Specific Question:   Procedure    Answer:   Lumbar epidural steroid injection under fluoroscopic guidance    Order Specific Question:   Physician/Practitioner performing the procedure    Answer:   Leonarda Leis A. Dossie Arbour, MD    Order Specific Question:   Indication/Reason    Answer:   Low back and/or lower extremity pain secondary to lumbar radiculitis   Provide equipment / supplies at bedside    "Epidural Tray" (Disposable  single use) Catheter: NOT required    Standing Status:   Standing    Number of Occurrences:   1    Order Specific Question:   Specify    Answer:   Epidural Tray   Chronic Opioid Analgesic:  Hydrocodone/APAP 5/325, 1 tab p.o. 4 times daily (#12) (last filled 08/28/2021) MME/day: 20 mg/day   Medications ordered for procedure: Meds ordered this encounter  Medications   iohexol (OMNIPAQUE) 180 MG/ML injection 10 mL    Must be Myelogram-compatible. If not available, you may substitute with a water-soluble, non-ionic, hypoallergenic, myelogram-compatible radiological contrast medium.   lidocaine (XYLOCAINE) 2 % (with pres) injection 400 mg   pentafluoroprop-tetrafluoroeth (GEBAUERS) aerosol   lactated ringers infusion   midazolam (VERSED) 5 MG/5ML injection 0.5-2 mg    Make sure Flumazenil is available in the pyxis when using this medication. If oversedation occurs, administer 0.2 mg IV over 15 sec. If after 45 sec no response, administer 0.2 mg again over 1 min; may repeat at 1 min intervals; not to exceed 4 doses (1 mg)   fentaNYL (SUBLIMAZE) injection 25-50 mcg    Make sure Narcan is available in the pyxis when using this medication. In the event of respiratory depression (RR< 8/min): Titrate NARCAN (naloxone) in increments of 0.1 to 0.2 mg IV at 2-3  minute intervals, until desired degree of reversal.   sodium chloride flush (NS) 0.9 % injection 2 mL   ropivacaine (PF) 2 mg/mL (0.2%)  (NAROPIN) injection 2 mL   triamcinolone acetonide (KENALOG-40) injection 40 mg   DISCONTD: lactated ringers infusion   Medications administered: We administered iohexol, lidocaine, lactated ringers, midazolam, fentaNYL, sodium chloride flush, ropivacaine (PF) 2 mg/mL (0.2%), and triamcinolone acetonide.  See the medical record for exact dosing, route, and time of administration.  Follow-up plan:   Return in about 2 weeks (around 12/29/2021) for Proc-day (T,Th), (F2F), (PPE).       Interventional Therapies  Risk  Complexity Considerations:   Estimated body mass index is 36.61 kg/m as calculated from the following:   Height as of this encounter: '5\' 5"'$  (1.651 m).   Weight as of this encounter: 220 lb (99.8 kg). WNL   Planned  Pending:   Therapeutic left L4-5 LESI #2  Prescriptions to replace her vitamin D deficiency were sent to the pharmacy.   Under consideration:   Diagnostic bilateral lumbar facet MBB #1  Diagnostic bilateral sacroiliac joint block #1  Diagnostic bilateral IA hip joint injection #1  Diagnostic bilateral trochanteric bursa injection #1    Completed:   Diagnostic left L4-5 LESI x1 (11/10/2021) (100/100/80 x1 week/0)  PT referral (12/29/2020).  She describes having attended twice a week for 2-3 weeks for a total of 4-5 sessions.  Not only did the physical therapy did not help the pain but it actually worsened it.   Completed by Girtha Hake, MD [facility] PMR):   Diagnostic/therapeutic bilateral L5 TFESI x2 (06/04/2019 & 08/02/2019) (no benefit & painful)    Therapeutic  Palliative (PRN) options:   None established     Recent Visits Date Type Provider Dept  11/24/21 Office Visit Milinda Pointer, MD [facility]  11/10/21 Procedure visit Milinda Pointer, MD [facility]  10/28/21 Office Visit Milinda Pointer, MD [facility]  Showing recent visits within past 90 days and meeting all other requirements Today's  Visits Date Type Provider Dept  12/15/21 Procedure visit Milinda Pointer, MD [facility]  Showing today's visits and meeting all other requirements Future Appointments Date Type Provider Dept  12/29/21 Appointment Milinda Pointer, MD [facility]  Showing future appointments within next 90 days and meeting all other requirements  Disposition: Discharge home  Discharge (Date  Time): 12/15/2021; 1027 hrs.   Primary Care Physician: [facility] [place] Location: [facility] Facility Note by: Gaspar Cola, MD Date: 12/15/2021; Time: 11:56 AM  Disclaimer:  Medicine is not an exact science. The only guarantee in medicine is that nothing is guaranteed. It is important to note that the decision to proceed with this intervention was based on the information collected from the patient. The Data and conclusions were drawn from the patient's questionnaire, the interview, and the physical examination. Because the information was provided in large part by the patient, it cannot be guaranteed that it has not been purposely or unconsciously manipulated. Every effort has been made to obtain as much relevant data as possible for this evaluation. It is important to note that the conclusions that lead to this procedure are derived in large part from the available data. Always take into account that the treatment will also be dependent on availability of resources and existing treatment guidelines, considered by other Pain Management Practitioners as being common knowledge and practice, at the time of the intervention. For Medico-Legal purposes,  it is also important to point out that variation in procedural techniques and pharmacological choices are the acceptable norm. The indications, contraindications, technique, and results of the above procedure should only be interpreted and judged by a Board-Certified Interventional Pain Specialist with  extensive familiarity and expertise in the same exact procedure and technique.

## 2021-12-16 ENCOUNTER — Telehealth: Payer: Self-pay | Admitting: *Deleted

## 2021-12-16 NOTE — Telephone Encounter (Signed)
Attempted to call for post procedure follow-up. Call cannot be completed as dialed. 

## 2021-12-28 NOTE — Progress Notes (Unsigned)
Patient: Cassidy Mathis  Service Category: E/M  Provider: Gaspar Cola, MD  DOB: 1975/04/09  DOS: 12/29/2021  Location: Office  MRN: [id number]  Setting: Ambulatory outpatient  Referring Provider: [facility]  Type: Established Patient  Specialty: Interventional Pain Management  PCP: [facility]  Location: Remote location  Delivery: TeleHealth     Virtual Encounter - Pain Management PROVIDER NOTE: Information contained herein reflects review and annotations entered in association with encounter. Interpretation of such information and data should be left to medically-trained personnel. Information provided to patient can be located elsewhere in the medical record under "Patient Instructions". Document created using STT-dictation technology, any transcriptional errors that may result from process are unintentional.    Contact & Pharmacy Preferred: [phone] Home: [phone] (home) Mobile: [phone] (mobile) E-mail: [email or web address]  [facility] [place], [place] - [address] [place] [address] Phone: [phone] Fax: [employer] [facility] #[id number] [address], [place] [address] [place] [address] Phone: [phone] Fax: [phone]   Pre-screening  Cassidy Mathis offered "in-person" vs "virtual" encounter. She indicated preferring virtual for this encounter.   Reason COVID-19*  Social distancing based on CDC and AMA recommendations.   I contacted Kasiya L Navejas on 12/29/2021 via telephone.      I clearly identified myself as Gaspar Cola, MD. I verified that I was speaking with the correct person using two identifiers (Name: Cassidy Mathis, and date of birth: [birth year]).  Consent I sought verbal advanced consent from Cassidy Mathis for virtual visit interactions. I informed Ms.  Mathis of possible security and privacy concerns, risks, and limitations associated with providing "not-in-person" medical evaluation and management services. I also informed Cassidy Mathis of the availability of "in-person" appointments. Finally, I informed her that there would be a charge for the virtual visit and that she could be  personally, fully or partially, financially responsible for it. Ms. Buskey expressed understanding and agreed to proceed.   Historic Elements   Cassidy Mathis is a 46 y.o. year old, female patient evaluated today after our last contact on 12/15/2021. Cassidy Mathis  has a past medical history of Allergy, Anemia, Asthma, Cancer ([place]), Depression, GERD (gastroesophageal reflux disease), Headache, Hypertension, Pancreatitis, Pilonidal cyst, and Pneumonia (2018). She also  has a past surgical history that includes Tubal ligation; epigastric hernia repair (N/A, 04/18/2015); Hernia repair; Cholecystectomy (10/21/2015); ERCP (N/A, 12/21/2016); Cyst excision; Cholecystectomy (Bilateral); Cervical conization w/bx (N/A, 03/23/2017); Radical hysterectomy (04/14/2017); Salpingectomy (Bilateral, 04/14/2017); Sentinel lymph node biopsy; Abdominal hysterectomy; Lysis of adhesion (N/A, 01/01/2020); Laparoscopic salpingo oophorectomy (Right, 01/01/2020); Colonoscopy with propofol (N/A, 12/06/2020); Esophagogastroduodenoscopy (12/06/2020); Givens capsule study (N/A, 12/08/2020); and VISCERAL ANGIOGRAPHY (N/A, 12/11/2020). Cassidy Mathis has a current medication list which includes the following prescription(s): albuterol, amlodipine, vitamin d3, gabapentin, losartan, victoza, and ergocalciferol. She  reports that she has been smoking cigarettes. She has a 6.00 pack-year smoking history. She has never used smokeless tobacco. She reports current alcohol use. She reports current drug use. Frequency: 1.00 time per week. Drug: Marijuana. Cassidy Mathis is allergic to ace inhibitors.  Estimated body mass index is 36.61 kg/m  as calculated from the following:   Height as of 12/15/21: _1  (1.651 m).   Weight as of 12/15/21: 220 lb (99.8 kg).  HPI  Today, she is being contacted for a post-procedure assessment.  The on was initially scheduled as a face-to-face encounter, but she called indicating that she would not be able to get out of work to attend her appointment.  For this reason the appointment was switched to a virtual visit.  She refers that on the day of the procedure she had absolutely no pain but on the next day it started to come back.  Currently she refers her lower extremity pain to be exactly the same as it was before.  I have tried to assess whether her low back pain is worse than the leg pain, but she states that her back pain is constant and once she starts having a flareup of the back pain, the leg pain follows.  I have instructed the patient to pay close attention as to which 1 is worse so that we can determine the etiology of the pain.  In terms of the leg pain she describes the pain to go through the back of the leg and to rotate anteriorly eventually ending up at the bottom of her feet and what seems to be an S1 dermatomal distribution.  Reviewing her medical records I have that her last lumbar MRI was done on 05/06/2019 and the pathology observed at that time would not really explain the reason why she is experiencing an S1 radiculitis.  At this time, I will need to repeat her physical exam and perhaps her lumbar MRI to determine the etiology of her pain.  Post-procedure evaluation   Type: Lumbar epidural steroid injection (LESI) (interlaminar) #2    Laterality: Left   Level:  L4-5 Level.  Imaging: Fluoroscopic guidance         Anesthesia: Local anesthesia (1-2% Lidocaine) Anxiolysis: None                 Sedation: Moderate Sedation                       DOS: 12/15/2021  Performed by: Gaspar Cola, MD  Purpose: Diagnostic/Therapeutic Indications: Lumbar radicular pain of intraspinal  etiology of more than 4 weeks that has failed to respond to conservative therapy and is severe enough to impact quality of life or function. 1. DDD (degenerative disc disease), lumbosacral   2. Lumbosacral radiculopathy at L5 (Bilateral) (L>R)   3. Lumbosacral radiculopathy at S1 (Bilateral) (L>R)   4. Weakness of lower extremity (Left)   5. Chronic lower extremity pain (2ry area of Pain) (Bilateral) (L>R)   6. 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 Acute bilateral low back pain with bilateral sciatica   8. Abnormal MRI, lumbar spine (05/06/2019)    NAS-11 Pain score:   Pre-procedure: 10-Worst pain ever/10   Post-procedure: 0-No pain/10      Effectiveness:  Initial hour after procedure: 100 %. Subsequent 4-6 hours post-procedure: 100 %. Analgesia past initial 6 hours: 60 % (only good relief for the first day and then the pain returned.). Ongoing improvement:  Analgesic: The patient states that the pain has returned to baseline. Function: Back to baseline ROM: Back to baseline  Pharmacotherapy Assessment   Opioid Analgesic: Hydrocodone/APAP 5/325, 1 tab p.o. 4 times daily (#12) (last filled 08/28/2021) MME/day: 20 mg/day   Monitoring: [place] PMP: PDMP reviewed during this encounter.       Pharmacotherapy: No side-effects or adverse reactions reported. Compliance: No problems identified. Effectiveness: Clinically acceptable. Plan: Refer to "POC". UDS:  Summary  Date Value Ref Range Status  12/29/2020 Note  Final  Comment:    ==================================================================== Compliance Drug Analysis, Ur ==================================================================== Test                             Result       Flag       Units  Drug Present and Declared for Prescription Verification   Butalbital                     PRESENT      EXPECTED   Gabapentin                     PRESENT      EXPECTED  Drug Present not Declared for Prescription  Verification   Carboxy-THC                    96           UNEXPECTED ng/mg creat    Carboxy-THC is a metabolite of tetrahydrocannabinol (THC). Source of    THC is most commonly herbal marijuana or marijuana-based products,    but THC is also present in a scheduled prescription medication.    Trace amounts of THC can be present in hemp and cannabidiol (CBD)    products. This test is not intended to distinguish between delta-9-    tetrahydrocannabinol, the predominant form of THC in most herbal or    marijuana-based products, and delta-8-tetrahydrocannabinol.  Drug Absent but Declared for Prescription Verification   Acetaminophen                  Not Detected UNEXPECTED    Acetaminophen, as indicated in the declared medication list, is not    always detected even when used as directed.  ==================================================================== Test                      Result    Flag   Units      Ref Range   Creatinine              54               mg/dL      >=20 ==================================================================== Declared Medications:  The flagging and interpretation on this report are based on the  following declared medications.  Unexpected results may arise from  inaccuracies in the declared medications.   **Note: The testing scope of this panel includes these medications:   Butalbital (Fioricet)  Gabapentin (Neurontin)   **Note: The testing scope of this panel does not include small to  moderate amounts of these reported medications:   Acetaminophen (Fioricet)   **Note: The testing scope of this panel does not include the  following reported medications:   Albuterol (Ventolin HFA)  Amlodipine (Norvasc)  Caffeine (Fioricet)  Cyanocobalamin  Esomeprazole (Nexium)  Lisinopril (Zestril) ==================================================================== For clinical consultation, please call [phone]. ====================================================================    No results found for: "CBDTHCR", "D8THCCBX", "D9THCCBX"   Laboratory Chemistry Profile   Renal Lab Results  Component Value Date   BUN 7 12/12/2020   CREATININE 0.84 12/12/2020   BCR 8 (L) 11/28/2020   GFRAA >60 05/05/2019   GFRNONAA >60 12/12/2020    Hepatic Lab Results  Component Value Date   AST 18 12/04/2020   ALT 19 12/04/2020   ALBUMIN 3.6 12/04/2020   ALKPHOS 59 12/04/2020   HCVAB 0.1 12/16/2016   LIPASE 87 (H) 12/04/2020    Electrolytes Lab  Results  Component Value Date   NA 138 12/12/2020   K 3.6 12/12/2020   CL 107 12/12/2020   CALCIUM 8.3 (L) 12/12/2020   MG 2.2 12/29/2020   PHOS 3.4 12/08/2020    Bone Lab Results  Component Value Date   25OHVITD1 8.9 (L) 12/29/2020   25OHVITD2 <1.0 12/29/2020   25OHVITD3 8.9 12/29/2020    Inflammation (CRP: Acute Phase) (ESR: Chronic Phase) Lab Results  Component Value Date   CRP 8 12/29/2020   ESRSEDRATE 62 (H) 12/29/2020         Note: Above Lab results reviewed.  Imaging  DG PAIN CLINIC C-ARM 1-60 MIN NO REPORT Fluoro was used, but no Radiologist interpretation will be provided.  Please refer to "NOTES" tab for provider progress note.  Assessment  The primary encounter diagnosis was Chronic low back pain (Bilateral) w/ sciatica (Bilateral). 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  Plan of Care  Problem-specific:  No problem-specific Assessment & Plan notes found for this encounter.  Cassidy Mathis has a  current medication list which includes the following long-term medication(s): albuterol, amlodipine, vitamin d3, losartan, victoza, and ergocalciferol.  Pharmacotherapy (Medications Ordered): No orders of the defined types were placed in this encounter.  Orders:  Orders Placed This Encounter  Procedures   MR LUMBAR SPINE WO CONTRAST    Patient presents with axial pain with possible radicular component. Please assist [place] in identifying specific level(s) and laterality of any additional findings such as: 1. Facet (Zygapophyseal) joint DJD (Hypertrophy, space narrowing, subchondral sclerosis, and/or osteophyte formation) 2. DDD and/or IVDD (Loss of disc height, desiccation, gas patterns, osteophytes, endplate sclerosis, or "Black disc disease") 3. Pars defects 4. Spondylolisthesis, spondylosis, and/or spondyloarthropathies (include Degree/Grade of displacement in mm) (stability) 5. Vertebral body Fractures (acute/chronic) (state percentage of collapse) 6. Demineralization (osteopenia/osteoporotic) 7. Bone pathology 8. Foraminal narrowing  9. Surgical changes 10. Central, Lateral Recess, and/or Foraminal Stenosis (include AP diameter of stenosis in mm) 11. Surgical changes (hardware type, status, and presence of fibrosis) 12. Modic Type Changes (MRI only) 13. IVDD (Disc bulge, protrusion, herniation, extrusion) (Level, laterality, extent)    Standing Status:   Future    Standing Expiration Date:   01/29/2022    Scheduling Instructions:     Imaging must be done as soon as possible. Inform patient that order will expire within 30 days and I will not renew it.    Order Specific Question:   What is the patient's sedation requirement?    Answer:   No Sedation    Order Specific Question:   Does the patient have a pacemaker or implanted devices?    Answer:   No    Order Specific Question:   Preferred imaging location?    Answer:   [facility] (table limit-350lbs)    Order Specific  Question:   Call Results- Best Contact Number?    Answer:   [phone] ([facility])    Order Specific Question:   Radiology Contrast Protocol - do NOT remove file path    Answer:   \\[email or web address]   Follow-up plan:   Return for Eval-day (M,W), (F2F), for PExm.     Interventional  Therapies  Risk  Complexity Considerations:    WNL   Planned  Pending:   Repeat lumbar MRI Prescriptions to replace her vitamin D deficiency were sent to the pharmacy.   Under consideration:   Diagnostic bilateral lumbar facet MBB #1  Diagnostic bilateral sacroiliac joint block #1  Diagnostic bilateral IA hip joint injection #1  Diagnostic bilateral trochanteric bursa injection #1    Completed:   Diagnostic left L4-5 LESI x2 (12/15/2021) (100/100/60 x1 week/0)  PT referral (12/29/2020).  She describes having attended twice a week for 2-3 weeks for a total of 4-5 sessions.  Not only did the physical therapy did not help the pain but it actually worsened it.   [place] [id number] [facility] PMR):   Diagnostic/therapeutic bilateral L5 TFESI x2 (06/04/2019 & 08/02/2019) (no benefit & painful)    Therapeutic  Palliative (PRN) options:   None established     Recent Visits Date Type Provider Dept  12/15/21 Procedure visit Milinda Pointer, MD [facility]  11/24/21 Office Visit Milinda Pointer, MD [facility]  11/10/21 Procedure visit Milinda Pointer, MD [facility]  10/28/21 Office Visit Milinda Pointer, MD [facility]  Showing recent visits within past 90 days and meeting all other requirements Today's Visits Date Type Provider Dept  12/29/21 Office Visit Milinda Pointer, MD [facility]  Showing today's visits and meeting all other requirements Future Appointments No visits were found meeting these conditions. Showing future appointments within next 90 days and meeting all other  requirements  I discussed the assessment and treatment plan with the patient. The patient was provided an opportunity to ask questions and all were answered. The patient agreed with the plan and demonstrated an understanding of the instructions.  Patient advised to call back or seek an in-person evaluation if the symptoms or condition worsens.  Duration of encounter: 18 minutes.  Note by: Gaspar Cola, MD Date: 12/29/2021; Time: 3:05 PM

## 2021-12-29 ENCOUNTER — Ambulatory Visit: Payer: Medicaid Other | Attending: Pain Medicine | Admitting: Pain Medicine

## 2021-12-29 ENCOUNTER — Telehealth: Payer: Self-pay | Admitting: Pain Medicine

## 2021-12-29 DIAGNOSIS — M5442 Lumbago with sciatica, left side: Secondary | ICD-10-CM | POA: Diagnosis not present

## 2021-12-29 DIAGNOSIS — R937 Abnormal findings on diagnostic imaging of other parts of musculoskeletal system: Secondary | ICD-10-CM

## 2021-12-29 DIAGNOSIS — M79604 Pain in right leg: Secondary | ICD-10-CM

## 2021-12-29 DIAGNOSIS — M48061 Spinal stenosis, lumbar region without neurogenic claudication: Secondary | ICD-10-CM

## 2021-12-29 DIAGNOSIS — M25552 Pain in left hip: Secondary | ICD-10-CM

## 2021-12-29 DIAGNOSIS — M5417 Radiculopathy, lumbosacral region: Secondary | ICD-10-CM

## 2021-12-29 DIAGNOSIS — M79605 Pain in left leg: Secondary | ICD-10-CM

## 2021-12-29 DIAGNOSIS — M47817 Spondylosis without myelopathy or radiculopathy, lumbosacral region: Secondary | ICD-10-CM

## 2021-12-29 DIAGNOSIS — R29898 Other symptoms and signs involving the musculoskeletal system: Secondary | ICD-10-CM

## 2021-12-29 DIAGNOSIS — M5137 Other intervertebral disc degeneration, lumbosacral region: Secondary | ICD-10-CM

## 2021-12-29 DIAGNOSIS — M5441 Lumbago with sciatica, right side: Secondary | ICD-10-CM

## 2021-12-29 DIAGNOSIS — M25551 Pain in right hip: Secondary | ICD-10-CM

## 2021-12-29 DIAGNOSIS — G8929 Other chronic pain: Secondary | ICD-10-CM

## 2021-12-29 NOTE — Telephone Encounter (Signed)
PT wanted to see why she needs another MRI done. PT stated that she just had MRI done last month. PT stated if she has to she will, just wanted to know why.

## 2021-12-30 NOTE — Telephone Encounter (Signed)
Spoke with Dr. Dossie Arbour. He states that repeat MRI does not need to be done. Patient called and informed.

## 2021-12-31 ENCOUNTER — Ambulatory Visit: Payer: Medicaid Other | Admitting: Pain Medicine

## 2022-08-07 ENCOUNTER — Emergency Department: Payer: BLUE CROSS/BLUE SHIELD

## 2022-08-07 ENCOUNTER — Other Ambulatory Visit: Payer: Self-pay

## 2022-08-07 ENCOUNTER — Emergency Department
Admission: EM | Admit: 2022-08-07 | Discharge: 2022-08-07 | Disposition: A | Discharge status: Home / Self Care | Payer: BLUE CROSS/BLUE SHIELD | Attending: Emergency Medicine | Admitting: Emergency Medicine

## 2022-08-07 DIAGNOSIS — M5136 Other intervertebral disc degeneration, lumbar region: Secondary | ICD-10-CM | POA: Diagnosis not present

## 2022-08-07 DIAGNOSIS — M5431 Sciatica, right side: Secondary | ICD-10-CM | POA: Diagnosis not present

## 2022-08-07 DIAGNOSIS — M545 Low back pain, unspecified: Secondary | ICD-10-CM | POA: Diagnosis present

## 2022-08-07 DIAGNOSIS — M5432 Sciatica, left side: Secondary | ICD-10-CM | POA: Insufficient documentation

## 2022-08-07 HISTORY — DX: Sciatica, unspecified side: M54.30

## 2022-08-07 LAB — POC URINE PREG, ED: Preg Test, Ur: NEGATIVE

## 2022-08-07 MED ORDER — DEXAMETHASONE SODIUM PHOSPHATE 10 MG/ML IJ SOLN
10.0000 mg | Freq: Once | INTRAMUSCULAR | Status: AC
  Administered 2022-08-07: 10 mg via INTRAMUSCULAR
  Filled 2022-08-07: qty 1

## 2022-08-07 MED ORDER — MELOXICAM 15 MG PO TABS: 15.0000 mg | ORAL_TABLET | Freq: Every day | ORAL | 0 refills | Status: DC

## 2022-08-07 MED ORDER — KETOROLAC TROMETHAMINE 30 MG/ML IJ SOLN
30.0000 mg | Freq: Once | INTRAMUSCULAR | Status: AC
  Administered 2022-08-07: 30 mg via INTRAMUSCULAR
  Filled 2022-08-07: qty 1

## 2022-08-07 MED ORDER — HYDROCODONE-ACETAMINOPHEN 5-325 MG PO TABS
1.0000 | ORAL_TABLET | Freq: Once | ORAL | Status: AC
  Administered 2022-08-07: 1 via ORAL
  Filled 2022-08-07: qty 1

## 2022-08-07 MED ORDER — PREDNISONE 50 MG PO TABS: 50.0000 mg | ORAL_TABLET | Freq: Every day | ORAL | 0 refills | Status: DC

## 2022-08-07 MED ORDER — HYDROCODONE-ACETAMINOPHEN 5-325 MG PO TABS: 1.0000 | ORAL_TABLET | ORAL | 0 refills | Status: DC | PRN

## 2022-08-07 MED ORDER — METHOCARBAMOL 500 MG PO TABS: 500.0000 mg | ORAL_TABLET | Freq: Four times a day (QID) | ORAL | 0 refills | Status: DC

## 2022-08-07 MED ORDER — METHOCARBAMOL 500 MG PO TABS
1000.0000 mg | ORAL_TABLET | Freq: Once | ORAL | Status: AC
  Administered 2022-08-07: 1000 mg via ORAL
  Filled 2022-08-07: qty 2

## 2022-08-07 NOTE — ED Triage Notes (Signed)
Pt to ED for bilateral sciatica pain since 1 month, from lower back down to both feet. Worse on L. Hx same. BP elevated in triage, takes 2 BP meds, did not take them today.

## 2022-08-07 NOTE — ED Provider Notes (Signed)
[facility] Provider Note  Patient Contact: 7:06 PM (approximate)   History   Sciatica   HPI  Cassidy Mathis is a 47 y.o. female who presents to the emergency department complaining of low back pain with sciatica-like symptoms.  Patient states that she has a history of chronic back issues, has been followed by physiatry in the past.  Patient states that they have informed her there was not anything else to do except surgical options and she has not wanted to follow that option.  Patient states that she has not seen physiatry for the last 7 months.  For the last month she has had increasing sciatica-like symptoms down both legs.  No bowel or bladder dysfunction, saddle anesthesia, paresthesias.  Patient presents for evaluation at this time.  No direct trauma.  No urinary or GI complaints.     Physical Exam   Triage Vital Signs: ED Triage Vitals  Encounter Vitals Group     BP 08/07/22 1443 (!) 166/107     Systolic BP Percentile --      Diastolic BP Percentile --      Pulse Rate 08/07/22 1443 68     Resp 08/07/22 1443 16     Temp 08/07/22 1443 98.3 F (36.8 C)     Temp Source 08/07/22 1443 Oral     SpO2 08/07/22 1443 98 %     Weight 08/07/22 1443 215 lb (97.5 kg)     Height 08/07/22 1443 5\' 5"  (1.651 m)     Head Circumference --      Peak Flow --      Pain Score 08/07/22 1442 10     Pain Loc --      Pain Education --      Exclude from Growth Chart --     Most recent vital signs: Vitals:   08/07/22 1443  BP: (!) 166/107  Pulse: 68  Resp: 16  Temp: 98.3 F (36.8 C)  SpO2: 98%     General: Alert and in no acute distress   Cardiovascular:  Good peripheral perfusion Respiratory: Normal respiratory effort without tachypnea or retractions. Lungs CTAB.  Gastrointestinal: Bowel sounds 4 quadrants. Soft and nontender to palpation. No guarding or rigidity. No palpable masses. No distention. No CVA tenderness. Musculoskeletal: Full range of  motion to all extremities.  Visualization of the spine reveals no visible signs of trauma. Neurologic:  No gross focal neurologic deficits are appreciated.  Skin:   No rash noted Other:   ED Results / Procedures / Treatments   Labs (all labs ordered are listed, but only abnormal results are displayed) Labs Reviewed  POC URINE PREG, ED     EKG     RADIOLOGY  I personally viewed, evaluated, and interpreted these images as part of my medical decision making, as well as reviewing the written report by the radiologist.  ED Provider Interpretation: Multilevel degenerative changes without acute/emergent finding of severe central cord compression.  CT Lumbar Spine Wo Contrast  Result Date: 08/07/2022 CLINICAL DATA:  Low back pain, bilateral sciatica extending to both feet. EXAM: CT LUMBAR SPINE WITHOUT CONTRAST TECHNIQUE: Multidetector CT imaging of the lumbar spine was performed without intravenous contrast administration. Multiplanar CT image reconstructions were also generated. RADIATION DOSE REDUCTION: This exam was performed according to the departmental dose-optimization program which includes automated exposure control, adjustment of the mA and/or kV according to patient size and/or use of iterative reconstruction technique. COMPARISON:  Previous studies including MRI lumbar  spine done on 12/02/2021 FINDINGS: Segmentation: There are 5 non-rib-bearing vertebrae in lumbar region. Alignment: Alignment of posterior margins of vertebral bodies appears normal. Vertebrae: No recent fracture is seen. Paraspinal and other soft tissues: There is no central spinal stenosis. There are few scattered small arterial calcifications surgical clips are seen in gallbladder fossa. Disc levels: Bulging of annulus is seen at T11-T12 level causing mild extrinsic pressure over the ventral margin of thecal sac without significant encroachment of neural foramina. No significant abnormalities are seen at T12-L1  level. No significant abnormalities are seen at the L1-L2 level. Bulging of annulus is causing mild narrowing of left neural foramina at L2-L3 level. Bulging annulus is causing extrinsic pressure over the ventral margin of thecal sac and mild encroachment of neural foramina at L3-L4 level. Bulging of annulus is causing extrinsic pressure over the ventral margin of thecal sac and mild to moderate encroachment of neural foramina at the L4-L5 level. Bulging annulus is causing mild encroachment of neural foramina at L5-S1 level. IMPRESSION: No recent fracture is seen in the lumbar spine. There is no significant central spinal stenosis. There is encroachment of neural foramina at multiple levels in lumbar spine and lower thoracic spine as described in the body of the report. Aortic arteriosclerosis. Electronically Signed   By: Ernie Avena M.D.   On: 08/07/2022 18:57    PROCEDURES:  Critical Care performed: No  Procedures   MEDICATIONS ORDERED IN ED: Medications  ketorolac (TORADOL) 30 MG/ML injection 30 mg (has no administration in time range)  dexamethasone (DECADRON) injection 10 mg (has no administration in time range)  methocarbamol (ROBAXIN) tablet 1,000 mg (has no administration in time range)  HYDROcodone-acetaminophen (NORCO/VICODIN) 5-325 MG per tablet 1 tablet (has no administration in time range)     IMPRESSION / MDM / ASSESSMENT AND PLAN / ED COURSE  I reviewed the triage vital signs and the nursing notes.                                 Differential diagnosis includes, but is not limited to, sciatica, cauda equina, compression fracture, herniated disc, bulging disc   Patient's presentation is most consistent with acute presentation with potential threat to life or bodily function.   Patient's diagnosis is consistent with sciatica, multilevel degenerative changes.  Patient presents emergency department after a months worth of worsening low back pain with radicular  symptoms.  History of same in the past.  Patient states that she has been told that she needs surgical intervention but is resistant to same.  No concerning neurodeficits.  Exam was overall reassuring with some tenderness over the sciatic notch.  Patient with good neuro exam to the lower extremities.  Imaging revealed multilevel degenerative changes without central cord compression.  Will treat symptomatically at this time.  Follow-up with neurosurgery.. Patient is given ED precautions to return to the ED for any worsening or new symptoms.     FINAL CLINICAL IMPRESSION(S) / ED DIAGNOSES   Final diagnoses:  Degenerative disc disease, lumbar  Bilateral sciatica     Rx / DC Orders   ED Discharge Orders          Ordered    meloxicam (MOBIC) 15 MG tablet  Daily        08/07/22 1920    predniSONE (DELTASONE) 50 MG tablet  Daily with breakfast        08/07/22 1920    HYDROcodone-acetaminophen (  NORCO/VICODIN) 5-325 MG tablet  Every 4 hours PRN        08/07/22 1920    methocarbamol (ROBAXIN) 500 MG tablet  4 times daily        08/07/22 1920             Note:  This document was prepared using Dragon voice recognition software and may include unintentional dictation errors.   Lanette Hampshire 08/07/22 Angelique Blonder, MD 08/07/22 [phone]

## 2022-08-13 NOTE — Progress Notes (Unsigned)
Referring Physician:  No referring provider defined for this encounter.  Primary Physician:  [facility]  History of Present Illness: 08/13/2022 Cassidy Mathis is here today with a chief complaint of low back pain.  She has had significant back pain for multiple years.  She had previous evaluations with total spine imaging.  She also had physical therapy at the end of 2022 for approximately 4 sessions, did not have any significant improvement instead had worsening of her back pain.  She also states that she had previous lumbar spine injections.  She gets pain that radiates down both legs, but the left leg is worse than the right.  Goes all the way down the posterior lateral aspect of her legs bilaterally.  She also gets numbness and tingling in her anterior thighs.  She states that it feels like "restless leg syndrome".  She cannot find anything that helps improve her pain.  She does get worse by standing, sitting, or maintaining any position for prolonged period.  She states that is there all the time.  Thankfully she is not experiencing any bowel or bladder symptoms.  She states that she does feel some weakness in the form of legs giving out on her, but she has not fallen she has always been able to catch herself.    Conservative measures:  Physical therapy: participated in 4 visits from 01/15/21 to 02/24/21 Multimodal medical therapy including regular antiinflammatories:  Methocarbamol, Hydrocodone, Gabapentin, Naproxen, Lidocaine patches, Meloxicam, Prednisone Injections: 12/15/21: Left L4-5 IL ESI (Dr. Laban Emperor), no improvement 11/10/21: Left L4-5 IL ESI (Dr. Laban Emperor), no improvement  The symptoms are causing a significant impact on the patient's life.   I have utilized the care everywhere function in epic to review the outside records available from external health systems.  Review of Systems:  A 10 point review of systems is negative, except for the  pertinent positives and negatives detailed in the HPI.  Past Medical History: Past Medical History:  Diagnosis Date   Allergy    seasonal   Anemia    h/o   Asthma    Cancer (HCC)    cervical   Depression    GERD (gastroesophageal reflux disease)    RARE-NO MEDS   Headache    MIGRAINES   Hypertension    PT STATES SHE IS SUPPOSED TO BE TAKING LISINOPRIL-HCTZ BUT HAS BEEN OUT "FOR A WHILE" NEEDS TO GET ANOTHER PRESCRIPTION FROM HER PCP   Pancreatitis    Pilonidal cyst    TOOK ANTIBIOTIC THIS MONTH AND IT IS RESOLVED   Pneumonia 2018   Sciatica     Past Surgical History: Past Surgical History:  Procedure Laterality Date   ABDOMINAL HYSTERECTOMY     partial   CERVICAL CONIZATION W/BX N/A 03/23/2017   Procedure: CONIZATION CERVIX WITH BIOPSY;  Surgeon: Artelia Laroche, MD;  Location: [facility];  Service: Gynecology;  Laterality: N/A;   CHOLECYSTECTOMY  10/21/2015   Procedure: LAPAROSCOPIC CHOLECYSTECTOMY WITH INTRAOPERATIVE CHOLANGIOGRAM;  Surgeon: Leafy Ro, MD;  Location: [facility];  Service: General;;   CHOLECYSTECTOMY Bilateral    COLONOSCOPY WITH PROPOFOL N/A 12/06/2020   Procedure: COLONOSCOPY WITH PROPOFOL;  Surgeon: Regis Bill, MD;  Location: [facility] ENDOS[facility];  Service: Endoscopy;  Laterality: N/A;   CYST EXCISION     tongue AND WRIST   EPIGASTRIC HERNIA REPAIR N/A 04/18/2015   Procedure: HERNIA REPAIR EPIGASTRIC ADULT;  Surgeon: Leafy Ro, MD;  Location: [facility];  Service:  General;  Laterality: N/A;   ERCP N/A 12/21/2016   Procedure: ENDOSCOPIC RETROGRADE CHOLANGIOPANCREATOGRAPHY (ERCP);  Surgeon: Midge Minium, MD;  Location: [facility];  Service: Endoscopy;  Laterality: N/A;   ESOPHAGOGASTRODUODENOSCOPY  12/06/2020   Procedure: ESOPHAGOGASTRODUODENOSCOPY (EGD);  Surgeon: Regis Bill, MD;  Location[facility] ENDOSCOPY;  Service: Endoscopy;;   GIVENS CAPSULE STUDY N/A 12/08/2020   Procedure: GIVENS CAPSULE STUDY;  Surgeon: Toney Reil, MD;  Location: [facility] ENDOSCOPY;  Service: Gastroenterology;  Laterality: N/A;   HERNIA REPAIR     LAPAROSCOPIC SALPINGO OOPHERECTOMY Right 01/01/2020   Procedure: LAPAROSCOPIC SALPINGO OOPHORECTOMY;  Surgeon: Schermerhorn, Ihor Austin, MD;  Location: [facility] OR[facility];  Service: Gynecology;  Laterality: Right;   LYSIS OF ADHESION N/A 01/01/2020   Procedure: LYSIS OF ADHESION;  Surgeon: Schermerhorn, Ihor Austin, MD;  Location: [facility] OR[facility];  Service: Gynecology;  Laterality: N/A;   RADICAL HYSTERECTOMY  04/14/2017   SALPINGECTOMY Bilateral 04/14/2017   SENTINEL LYMPH NODE BIOPSY     TUBAL LIGATION     VISCERAL ANGIOGRAPHY N/A 12/11/2020   Procedure: VISCERAL ANGIOGRAPHY;  Surgeon: Annice Needy, MD;  Location: [facility] INVASIVE CV LAB;  Service: Cardiovascular;  Laterality: N/A;    Allergies: Allergies as of 08/18/2022 - Review Complete 08/07/2022  Allergen Reaction Noted   Ace inhibitors Swelling 03/30/2021    Medications:  Current Outpatient Medications:    albuterol (PROVENTIL HFA;VENTOLIN HFA) 108 (90 Base) MCG/ACT inhaler, Inhale 2 puffs into the lungs every 6 (six) hours as needed for wheezing or shortness of breath., Disp: 1 Inhaler, Rfl: 2   amLODipine (NORVASC) 5 MG tablet, TAKE 1 TABLET(5 MG) BY MOUTH DAILY (Patient taking differently: Take 10 mg by mouth daily.), Disp: 30 tablet, Rfl: 0   Cholecalciferol (VITAMIN D3) 125 MCG (5000 UT) CAPS, Take 1 capsule (5,000 Units total) by mouth daily with breakfast. Take along with calcium and magnesium., Disp: 30 capsule, Rfl: 2   ergocalciferol (VITAMIN D2) 1.25 MG (50000 UT) capsule, Take 1 capsule (50,000 Units total) by mouth 2 (two) times a week. X 6 weeks., Disp: 12 capsule, Rfl: 0   gabapentin (NEURONTIN) 300 MG capsule, Take 300 mg by mouth at bedtime., Disp: , Rfl:    HYDROcodone-acetaminophen (NORCO/VICODIN) 5-325 MG tablet, Take 1 tablet by mouth every 4 (four) hours as needed for moderate pain., Disp: 10 tablet, Rfl: 0   losartan (COZAAR) 25 MG  tablet, Take 25 mg by mouth daily., Disp: , Rfl:    meloxicam (MOBIC) 15 MG tablet, Take 1 tablet (15 mg total) by mouth daily., Disp: 30 tablet, Rfl: 0   methocarbamol (ROBAXIN) 500 MG tablet, Take 1 tablet (500 mg total) by mouth 4 (four) times daily., Disp: 20 tablet, Rfl: 0   predniSONE (DELTASONE) 50 MG tablet, Take 1 tablet (50 mg total) by mouth daily with breakfast., Disp: 5 tablet, Rfl: 0   VICTOZA 18 MG/3ML SOPN, Inject into the skin., Disp: , Rfl:   Social History: Social History   Tobacco Use   Smoking status: Some Days    Current packs/day: 0.50    Average packs/day: 0.5 packs/day for 12.0 years (6.0 ttl pk-yrs)    Types: Cigarettes   Smokeless tobacco: Never   Tobacco comments:    11/28/2020 last smoked cigarettes 10 days ago  Vaping Use   Vaping status: Never Used  Substance Use Topics   Alcohol use: Yes    Comment: occassionally; less than 1/month   Drug use: Yes    Frequency: 1.0 times per week  Types: Marijuana    Family Medical History: Family History  Problem Relation Age of Onset   Hypertension Mother    Gallstones Mother    Hypertension Father    Gout Father    Cancer Paternal Uncle        lung   Cancer Maternal Grandmother        not sure what type of cancer   Cancer Maternal Grandfather        not sure what type of cancer   Cancer Paternal Grandmother        not sure what type of cancer   Cancer Paternal Grandfather        not sure what type of cancer    Physical Examination: There were no vitals filed for this visit.  General: Patient is in no apparent distress. Attention to examination is appropriate.  Neck:   Supple.  Full range of motion.  Respiratory: Patient is breathing without any difficulty.   NEUROLOGICAL:     Awake, alert, oriented to person, place, and time.  Speech is clear and fluent.   Cranial Nerves: Pupils equal round and reactive to light.  Facial tone is symmetric.  Facial sensation is symmetric. Shoulder shrug is  symmetric. Tongue protrusion is midline.    Strength: Side Biceps Triceps Deltoid Interossei Grip Wrist Ext. Wrist Flex.  R 5 5 5 5 5 5 5   L 5 5 5 5 5 5 5    Side Iliopsoas Quads Hamstring PF DF EHL  R 5 5 5 5 5 5   L 5 5 5 5 5 5    Reflexes are 2+ and symmetric at the biceps, triceps, brachioradialis, patella and achilles.   Hoffman's is absent. Clonus is absent.  Babinski responses mute.   Bilateral upper and lower extremity sensation is intact to light touch at rest     Gait is antalgic.    Imaging: Narrative & Impression  CLINICAL DATA:  Low back pain. Symptoms persist with greater than 6 weeks of treatment. Pain radiates into the lower extremities bilaterally, left greater than right.   EXAM: MRI LUMBAR SPINE WITHOUT CONTRAST   TECHNIQUE: Multiplanar, multisequence MR imaging of the lumbar spine was performed. No intravenous contrast was administered.   COMPARISON:  05/05/2019   FINDINGS: Segmentation: 5 non rib-bearing lumbar type vertebral bodies are present. The lowest fully formed vertebral body is L5.   Alignment: No significant listhesis is present. Mild straightening of the normal lumbar lordosis is again seen.   Vertebrae: Diffuse decreased marrow signal is slightly improved compared to the prior study. No discrete lesions are present. Vertebral body heights are maintained.   Conus medullaris and cauda equina: Conus extends to the L2 level. Conus and cauda equina appear normal.   Paraspinal and other soft tissues: Limited imaging the abdomen is unremarkable. There is no significant adenopathy. No solid organ lesions are present.   Disc levels:   L1-2: Normal disc signal and height is present. No focal protrusion or stenosis is present.   L2-3: Normal disc signal and height is present. No focal protrusion or stenosis is present.   L3-4: A broad-based disc protrusion is again noted. Mild facet hypertrophy demonstrates slight progression. Mild right  foraminal narrowing is present. The left foramen is patent.   L4-5: A broad-based disc protrusion is present. A far left annular tear is present adjacent to the exiting left L4 nerve roots. Mild foraminal narrowing is worse on the left. Mild right foraminal narrowing is present.  L5-S1: Mild facet hypertrophy is noted bilaterally. No significant disc protrusion or stenosis is present.   IMPRESSION: 1. Slight progression of mild right foraminal narrowing at L3-4 secondary to a broad-based disc protrusion and mild facet hypertrophy. 2. Far left annular tear at L4-5 adjacent to the exiting left L4 nerve roots. 3. Mild foraminal narrowing bilaterally at L4-5 is worse on the left. 4. Mild facet hypertrophy at L5-S1 without significant disc protrusion or stenosis. 5. Diffuse decreased marrow signal is slightly improved compared to the prior study. This is nonspecific, but can be seen in the setting of anemia, smoking, or obesity.     Electronically Signed   By: Marin Roberts M.D.   On: 12/06/2021 07:02   Narrative & Impression  CLINICAL DATA:  Low back pain, bilateral sciatica extending to both feet.   EXAM: CT LUMBAR SPINE WITHOUT CONTRAST   TECHNIQUE: Multidetector CT imaging of the lumbar spine was performed without intravenous contrast administration. Multiplanar CT image reconstructions were also generated.   RADIATION DOSE REDUCTION: This exam was performed according to the departmental dose-optimization program which includes automated exposure control, adjustment of the mA and/or kV according to patient size and/or use of iterative reconstruction technique.   COMPARISON:  Previous studies including MRI lumbar spine done on 12/02/2021   FINDINGS: Segmentation: There are 5 non-rib-bearing vertebrae in lumbar region.   Alignment: Alignment of posterior margins of vertebral bodies appears normal.   Vertebrae: No recent fracture is seen.   Paraspinal  and other soft tissues: There is no central spinal stenosis. There are few scattered small arterial calcifications surgical clips are seen in gallbladder fossa.   Disc levels: Bulging of annulus is seen at T11-T12 level causing mild extrinsic pressure over the ventral margin of thecal sac without significant encroachment of neural foramina.   No significant abnormalities are seen at T12-L1 level.   No significant abnormalities are seen at the L1-L2 level.   Bulging of annulus is causing mild narrowing of left neural foramina at L2-L3 level.   Bulging annulus is causing extrinsic pressure over the ventral margin of thecal sac and mild encroachment of neural foramina at L3-L4 level.   Bulging of annulus is causing extrinsic pressure over the ventral margin of thecal sac and mild to moderate encroachment of neural foramina at the L4-L5 level.   Bulging annulus is causing mild encroachment of neural foramina at L5-S1 level.   IMPRESSION: No recent fracture is seen in the lumbar spine. There is no significant central spinal stenosis.   There is encroachment of neural foramina at multiple levels in lumbar spine and lower thoracic spine as described in the body of the report.   Aortic arteriosclerosis.     Electronically Signed   By: Ernie Avena M.D.   On: 08/07/2022 18:57   I have personally reviewed the images and agree with the above interpretation.  Medical Decision Making/Assessment and Plan: Ms. Weyers is a pleasant 47 y.o. female with history of chronic back pain who has had previous evaluations including whole spine imaging.  In late 2022 she underwent 4 rounds of physical therapy with no improvement but rather some exacerbation of her pain.  In early 2023 she had lumbar spine injections which gave her no relief.  She has some pain that radiates down into her leg and also developed some numbness.  Her strength is fully intact with no clear deficit, straight leg  raise is negative, reflexes are intact and symmetric.  Her lower extremity pain  is severe, however not easily localizable on history or examination.  For this reason we feel that she may benefit from a EMG/nerve conduction studies to evaluate for radiculopathy versus a peripheral neuropathy or other process that could be affecting her quality of life.  She does state that she gets some midline back pain with certain exacerbations that starts in her neck or upper back and radiates down to her legs.  She did have history of cervical spondylosis on her last MRI as well as some low thoracic/upper lumbar spondylosis.  We do feel that it would be helpful in her case to evaluate both her cervical spine and her thoracic spine as we have not had updated imaging in a few years.  In regards to her pain control we urged her to reach back out and establish care with her pain team as she states she is currently 10 out of 10 and not currently being managed.  We also recommended her undergo a physical therapy evaluation as this could be beneficial for her back pain as well as could help her with a functional clinical evaluation  Thank you for involving me in the care of this patient.    Lovenia Kim MD/MSCR Neurosurgery

## 2022-08-18 ENCOUNTER — Ambulatory Visit (INDEPENDENT_AMBULATORY_CARE_PROVIDER_SITE_OTHER): Payer: BLUE CROSS/BLUE SHIELD | Admitting: Neurosurgery

## 2022-08-18 ENCOUNTER — Encounter: Payer: Self-pay | Admitting: Neurosurgery

## 2022-08-18 VITALS — BP 130/78 | Ht 65.0 in | Wt 210.0 lb

## 2022-08-18 DIAGNOSIS — M549 Dorsalgia, unspecified: Secondary | ICD-10-CM

## 2022-08-18 DIAGNOSIS — M5416 Radiculopathy, lumbar region: Secondary | ICD-10-CM | POA: Diagnosis not present

## 2022-08-18 DIAGNOSIS — M542 Cervicalgia: Secondary | ICD-10-CM

## 2022-08-18 DIAGNOSIS — G8929 Other chronic pain: Secondary | ICD-10-CM

## 2022-08-19 ENCOUNTER — Encounter: Payer: Self-pay | Admitting: Neurology

## 2022-08-19 DIAGNOSIS — M5459 Other low back pain: Secondary | ICD-10-CM | POA: Insufficient documentation

## 2022-08-19 NOTE — Progress Notes (Signed)
(  08/23/2022) NO-SHOW to requested appointment for evaluation of low back pain.

## 2022-08-23 ENCOUNTER — Ambulatory Visit: Payer: BLUE CROSS/BLUE SHIELD | Admitting: Pain Medicine

## 2022-08-23 DIAGNOSIS — G8929 Other chronic pain: Secondary | ICD-10-CM | POA: Diagnosis present

## 2022-08-23 DIAGNOSIS — M47816 Spondylosis without myelopathy or radiculopathy, lumbar region: Secondary | ICD-10-CM

## 2022-08-23 DIAGNOSIS — M47817 Spondylosis without myelopathy or radiculopathy, lumbosacral region: Secondary | ICD-10-CM

## 2022-08-23 DIAGNOSIS — M48061 Spinal stenosis, lumbar region without neurogenic claudication: Secondary | ICD-10-CM

## 2022-08-23 DIAGNOSIS — Z91199 Patient's noncompliance with other medical treatment and regimen due to unspecified reason: Secondary | ICD-10-CM | POA: Insufficient documentation

## 2022-08-23 DIAGNOSIS — M545 Low back pain, unspecified: Secondary | ICD-10-CM

## 2022-08-23 DIAGNOSIS — M5459 Other low back pain: Secondary | ICD-10-CM

## 2022-08-23 DIAGNOSIS — R937 Abnormal findings on diagnostic imaging of other parts of musculoskeletal system: Secondary | ICD-10-CM

## 2022-08-25 ENCOUNTER — Other Ambulatory Visit: Payer: Self-pay

## 2022-08-25 DIAGNOSIS — R202 Paresthesia of skin: Secondary | ICD-10-CM

## 2022-08-31 ENCOUNTER — Ambulatory Visit: Payer: BLUE CROSS/BLUE SHIELD

## 2022-08-31 ENCOUNTER — Encounter: Payer: BLUE CROSS/BLUE SHIELD | Admitting: Neurology

## 2022-09-05 NOTE — Progress Notes (Unsigned)
PROVIDER NOTE: Information contained herein reflects review and annotations entered in association with encounter. Interpretation of such information and data should be left to medically-trained personnel. Information provided to patient can be located elsewhere in the medical record under "Patient Instructions". Document created using STT-dictation technology, any transcriptional errors that may result from process are unintentional.    Patient: Cassidy Mathis  Service Category: E/M  Provider: Oswaldo Done, MD  DOB: 02-26-1975  DOS: 09/06/2022  Referring Provider: Center, [facility]*  MRN: [id number]  Specialty: Interventional Pain Management  PCP: [facility]  Type: Established Patient  Setting: Ambulatory outpatient    Location: Office  Delivery: Face-to-face     HPI  Ms. Cassidy Mathis, a 47 y.o. year old female, is here today because of her No primary diagnosis found.. Cassidy Mathis primary complain today is No chief complaint on file.  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); Malignant neoplasm of overlapping sites of cervix (HCC); Chronic pain syndrome; Abnormal MRI, cervical spine (05/06/2019); Abnormal MRI, lumbar spine (05/06/2019); Chronic low back pain (1ry area of Pain) (Bilateral) (L>R) w/o sciatica; Chronic lower extremity pain (2ry area of Pain) (Bilateral) (L>R); Chronic sacroiliac joint pain (Bilateral) (R>L); Lumbosacral facet syndrome (Bilateral); Lumbar facet hypertrophy (L4-5) (Bilateral); Lumbosacral facet arthropathy; Somatic dysfunction of sacroiliac joints (Bilateral); Lumbosacral radiculopathy at L5 (Bilateral) (L>R); Lumbosacral radiculopathy at S1 (Bilateral) (L>R); Weakness of lower extremity (Left); Chronic hip pain (Bilateral) (L>R); Greater  trochanteric bursitis of hips (Bilateral) (R>L); DDD (degenerative disc disease), lumbosacral; Chronic generalized pain; Osteoarthritis of knee (Left); Chronic low back pain (Bilateral) w/ sciatica (Bilateral); and Lumbar facet joint pain on their pertinent problem list. Pain Assessment: Severity of   is reported as a  /10. Location:    / . Onset:  . Quality:  . Timing:  . Modifying factor(s):  Marland Kitchen Vitals:  vitals were not taken for this visit.  BMI: Estimated body mass index is 34.95 kg/m as calculated from the following:   Height as of 08/18/22: 5\' 5"  (1.651 m).   Weight as of 08/18/22: 210 lb 0.3 oz (95.3 kg). Last encounter: 08/23/2022. Last procedure: 12/15/2021.  Reason for encounter:  *** . ***  Pharmacotherapy Assessment  Analgesic: Hydrocodone/APAP 5/325, 1 tab p.o. 4 times daily (#12) (last filled 08/28/2021) MME/day: 20 mg/day   Monitoring: [place] PMP: PDMP reviewed during this encounter.       Pharmacotherapy: No side-effects or adverse reactions reported. Compliance: No problems identified. Effectiveness: Clinically acceptable.  No notes on file  No results found for: "CBDTHCR" No results found for: "D8THCCBX" No results found for: "D9THCCBX"  UDS:  Summary  Date Value Ref Range Status  12/29/2020 Note  Final    Comment:    ==================================================================== Compliance Drug Analysis, Ur ==================================================================== Test                             Result       Flag       Units  Drug Present and Declared for Prescription Verification   Butalbital                     PRESENT      EXPECTED   Gabapentin  PRESENT      EXPECTED  Drug Present not Declared for Prescription Verification   Carboxy-THC                    96           UNEXPECTED ng/mg creat    Carboxy-THC is a metabolite of tetrahydrocannabinol (THC). Source of    THC is most commonly herbal marijuana or marijuana-based  products,    but THC is also present in a scheduled prescription medication.    Trace amounts of THC can be present in hemp and cannabidiol (CBD)    products. This test is not intended to distinguish between delta-9-    tetrahydrocannabinol, the predominant form of THC in most herbal or    marijuana-based products, and delta-8-tetrahydrocannabinol.  Drug Absent but Declared for Prescription Verification   Acetaminophen                  Not Detected UNEXPECTED    Acetaminophen, as indicated in the declared medication list, is not    always detected even when used as directed.  ==================================================================== Test                      Result    Flag   Units      Ref Range   Creatinine              54               mg/dL      >=16 ==================================================================== Declared Medications:  The flagging and interpretation on this report are based on the  following declared medications.  Unexpected results may arise from  inaccuracies in the declared medications.   **Note: The testing scope of this panel includes these medications:   Butalbital (Fioricet)  Gabapentin (Neurontin)   **Note: The testing scope of this panel does not include small to  moderate amounts of these reported medications:   Acetaminophen (Fioricet)   **Note: The testing scope of this panel does not include the  following reported medications:   Albuterol (Ventolin HFA)  Amlodipine (Norvasc)  Caffeine (Fioricet)  Cyanocobalamin  Esomeprazole (Nexium)  Lisinopril (Zestril) ==================================================================== For clinical consultation, please call [phone]. ====================================================================       ROS  Constitutional: Denies any fever or chills Gastrointestinal: No reported hemesis, hematochezia, vomiting, or acute GI distress Musculoskeletal: Denies any acute  onset joint swelling, redness, loss of ROM, or weakness Neurological: No reported episodes of acute onset apraxia, aphasia, dysarthria, agnosia, amnesia, paralysis, loss of coordination, or loss of consciousness  Medication Review  HYDROcodone-acetaminophen, Vitamin D3, albuterol, amLODipine, ergocalciferol, gabapentin, liraglutide, losartan, meloxicam, methocarbamol, and predniSONE  History Review  Allergy: Cassidy Mathis is allergic to ace inhibitors. Drug: Cassidy Mathis  reports current drug use. Frequency: 1.00 time per week. Drug: Marijuana. Alcohol:  reports current alcohol use. Tobacco:  reports that she quit smoking about 19 months ago. Her smoking use included cigarettes. She has a 6 pack-year smoking history. She has never used smokeless tobacco. Social: Cassidy Mathis  reports that she quit smoking about 19 months ago. Her smoking use included cigarettes. She has a 6 pack-year smoking history. She has never used smokeless tobacco. She reports current alcohol use. She reports current drug use. Frequency: 1.00 time per week. Drug: Marijuana. Medical:  has a past medical history of Allergy, Anemia, Asthma, Cancer (HCC), Depression, GERD (gastroesophageal reflux disease), Headache, Hypertension, Pancreatitis, Pilonidal cyst, Pneumonia (2018), and Sciatica.  Surgical: Cassidy Mathis  has a past surgical history that includes Tubal ligation; epigastric hernia repair (N/A, 04/18/2015); Hernia repair; Cholecystectomy (10/21/2015); ERCP (N/A, 12/21/2016); Cyst excision; Cholecystectomy (Bilateral); Cervical conization w/bx (N/A, 03/23/2017); Radical hysterectomy (04/14/2017); Salpingectomy (Bilateral, 04/14/2017); Sentinel lymph node biopsy; Abdominal hysterectomy; Lysis of adhesion (N/A, 01/01/2020); Laparoscopic salpingo oophorectomy (Right, 01/01/2020); Colonoscopy with propofol (N/A, 12/06/2020); Esophagogastroduodenoscopy (12/06/2020); Givens capsule study (N/A, 12/08/2020); and VISCERAL ANGIOGRAPHY (N/A,  12/11/2020). Family: family history includes Cancer in her maternal grandfather, maternal grandmother, paternal grandfather, paternal grandmother, and paternal uncle; Gallstones in her mother; Gout in her father; Hypertension in her father and mother.  Laboratory Chemistry Profile   Renal Lab Results  Component Value Date   BUN 7 12/12/2020   CREATININE 0.84 12/12/2020   BCR 8 (L) 11/28/2020   GFRAA >60 05/05/2019   GFRNONAA >60 12/12/2020    Hepatic Lab Results  Component Value Date   AST 18 12/04/2020   ALT 19 12/04/2020   ALBUMIN 3.6 12/04/2020   ALKPHOS 59 12/04/2020   HCVAB 0.1 12/16/2016   LIPASE 87 (H) 12/04/2020    Electrolytes Lab Results  Component Value Date   NA 138 12/12/2020   K 3.6 12/12/2020   CL 107 12/12/2020   CALCIUM 8.3 (L) 12/12/2020   MG 2.2 12/29/2020   PHOS 3.4 12/08/2020    Bone Lab Results  Component Value Date   25OHVITD1 8.9 (L) 12/29/2020   25OHVITD2 <1.0 12/29/2020   25OHVITD3 8.9 12/29/2020    Inflammation (CRP: Acute Phase) (ESR: Chronic Phase) Lab Results  Component Value Date   CRP 8 12/29/2020   ESRSEDRATE 62 (H) 12/29/2020         Note: Above Lab results reviewed.  Recent Imaging Review  CT Lumbar Spine Wo Contrast CLINICAL DATA:  Low back pain, bilateral sciatica extending to both feet.  EXAM: CT LUMBAR SPINE WITHOUT CONTRAST  TECHNIQUE: Multidetector CT imaging of the lumbar spine was performed without intravenous contrast administration. Multiplanar CT image reconstructions were also generated.  RADIATION DOSE REDUCTION: This exam was performed according to the departmental dose-optimization program which includes automated exposure control, adjustment of the mA and/or kV according to patient size and/or use of iterative reconstruction technique.  COMPARISON:  Previous studies including MRI lumbar spine done on 12/02/2021  FINDINGS: Segmentation: There are 5 non-rib-bearing vertebrae in  lumbar region.  Alignment: Alignment of posterior margins of vertebral bodies appears normal.  Vertebrae: No recent fracture is seen.  Paraspinal and other soft tissues: There is no central spinal stenosis. There are few scattered small arterial calcifications surgical clips are seen in gallbladder fossa.  Disc levels: Bulging of annulus is seen at T11-T12 level causing mild extrinsic pressure over the ventral margin of thecal sac without significant encroachment of neural foramina.  No significant abnormalities are seen at T12-L1 level.  No significant abnormalities are seen at the L1-L2 level.  Bulging of annulus is causing mild narrowing of left neural foramina at L2-L3 level.  Bulging annulus is causing extrinsic pressure over the ventral margin of thecal sac and mild encroachment of neural foramina at L3-L4 level.  Bulging of annulus is causing extrinsic pressure over the ventral margin of thecal sac and mild to moderate encroachment of neural foramina at the L4-L5 level.  Bulging annulus is causing mild encroachment of neural foramina at L5-S1 level.  IMPRESSION: No recent fracture is seen in the lumbar spine. There is no significant central spinal stenosis.  There is encroachment of neural foramina at multiple levels  in lumbar spine and lower thoracic spine as described in the body of the report.  Aortic arteriosclerosis.  Electronically Signed   By: Ernie Avena M.D.   On: 08/07/2022 18:57 Note: Reviewed        Physical Exam  General appearance: Well nourished, well developed, and well hydrated. In no apparent acute distress Mental status: Alert, oriented x 3 (person, place, & time)       Respiratory: No evidence of acute respiratory distress Eyes: PERLA Vitals: LMP 03/18/2017 (Exact Date)  BMI: Estimated body mass index is 34.95 kg/m as calculated from the following:   Height as of 08/18/22: 5\' 5"  (1.651 m).   Weight as of 08/18/22: 210 lb 0.3  oz (95.3 kg). Ideal: Patient weight not recorded  Assessment   Diagnosis Status  No diagnosis found. Controlled Controlled Controlled   Updated Problems: No problems updated.  Plan of Care  Problem-specific:  No problem-specific Assessment & Plan notes found for this encounter.  Cassidy Mathis has a current medication list which includes the following long-term medication(s): albuterol, amlodipine, vitamin d3, ergocalciferol, losartan, and victoza.  Pharmacotherapy (Medications Ordered): No orders of the defined types were placed in this encounter.  Orders:  No orders of the defined types were placed in this encounter.  Follow-up plan:   No follow-ups on file.      Interventional Therapies  Risk  Complexity Considerations:    WNL   Planned  Pending:   Repeat lumbar MRI Prescriptions to replace her vitamin D deficiency were sent to the pharmacy.   Under consideration:   Diagnostic bilateral lumbar facet MBB #1  Diagnostic bilateral sacroiliac joint block #1  Diagnostic bilateral IA hip joint injection #1  Diagnostic bilateral trochanteric bursa injection #1    Completed:   Diagnostic left L4-5 LESI x2 (12/15/2021) (100/100/60 x1 week/0)  PT referral (12/29/2020).  She describes having attended twice a week for 2-3 weeks for a total of 4-5 sessions.  Not only did the physical therapy did not help the pain but it actually worsened it.   Completed by Filomena Jungling, MD [facility] PMR):   Diagnostic/therapeutic bilateral L5 TFESI x2 (06/04/2019 & 08/02/2019) (no benefit & painful)    Therapeutic  Palliative (PRN) options:   None established       Recent Visits No visits were found meeting these conditions. Showing recent visits within past 90 days and meeting all other requirements Future Appointments Date Type Provider Dept  09/06/22 Appointment Delano Metz, MD [facility]  Showing future appointments within next 90 days and  meeting all other requirements  I discussed the assessment and treatment plan with the patient. The patient was provided an opportunity to ask questions and all were answered. The patient agreed with the plan and demonstrated an understanding of the instructions.  Patient advised to call back or seek an in-person evaluation if the symptoms or condition worsens.  Duration of encounter: *** minutes.  Total time on encounter, as per AMA guidelines included both the face-to-face and non-face-to-face time personally spent by the physician and/or other qualified health care professional(s) on the day of the encounter (includes time in activities that require the physician or other qualified health care professional and does not include time in activities normally performed by clinical staff). Physician's time may include the following activities when performed: Preparing to see the patient (e.g., pre-charting review of records, searching for previously ordered imaging, lab work, and nerve conduction tests) Review of prior analgesic  pharmacotherapies. Reviewing PMP Interpreting ordered tests (e.g., lab work, imaging, nerve conduction tests) Performing post-procedure evaluations, including interpretation of diagnostic procedures Obtaining and/or reviewing separately obtained history Performing a medically appropriate examination and/or evaluation Counseling and educating the patient/family/caregiver Ordering medications, tests, or procedures Referring and communicating with other health care professionals (when not separately reported) Documenting clinical information in the electronic or other health record Independently interpreting results (not separately reported) and communicating results to the patient/ family/caregiver Care coordination (not separately reported)  Note by: Oswaldo Done, MD Date: 09/06/2022; Time: 8:54 AM

## 2022-09-06 ENCOUNTER — Ambulatory Visit: Payer: BLUE CROSS/BLUE SHIELD | Attending: Pain Medicine | Admitting: Pain Medicine

## 2022-09-06 ENCOUNTER — Encounter: Payer: Self-pay | Admitting: Pain Medicine

## 2022-09-06 VITALS — BP 156/99 | HR 83 | Temp 97.2°F | Ht 65.0 in | Wt 210.0 lb

## 2022-09-06 DIAGNOSIS — M47816 Spondylosis without myelopathy or radiculopathy, lumbar region: Secondary | ICD-10-CM | POA: Diagnosis present

## 2022-09-06 DIAGNOSIS — M5137 Other intervertebral disc degeneration, lumbosacral region: Secondary | ICD-10-CM | POA: Insufficient documentation

## 2022-09-06 DIAGNOSIS — R937 Abnormal findings on diagnostic imaging of other parts of musculoskeletal system: Secondary | ICD-10-CM | POA: Diagnosis present

## 2022-09-06 DIAGNOSIS — G8929 Other chronic pain: Secondary | ICD-10-CM | POA: Insufficient documentation

## 2022-09-06 DIAGNOSIS — M47817 Spondylosis without myelopathy or radiculopathy, lumbosacral region: Secondary | ICD-10-CM

## 2022-09-06 DIAGNOSIS — M545 Low back pain, unspecified: Secondary | ICD-10-CM

## 2022-09-06 DIAGNOSIS — M51379 Other intervertebral disc degeneration, lumbosacral region without mention of lumbar back pain or lower extremity pain: Secondary | ICD-10-CM

## 2022-09-06 DIAGNOSIS — M5459 Other low back pain: Secondary | ICD-10-CM

## 2022-09-06 MED ORDER — KETOROLAC TROMETHAMINE 60 MG/2ML IM SOLN
60.0000 mg | Freq: Once | INTRAMUSCULAR | Status: AC
  Administered 2022-09-06: 60 mg via INTRAMUSCULAR
  Filled 2022-09-06: qty 2

## 2022-09-06 MED ORDER — METHOCARBAMOL 1000 MG/10ML IJ SOLN
200.0000 mg | Freq: Once | INTRAMUSCULAR | Status: AC
  Administered 2022-09-06: 200 mg via INTRAMUSCULAR
  Filled 2022-09-06: qty 10

## 2022-09-06 NOTE — Patient Instructions (Signed)

## 2022-09-06 NOTE — Progress Notes (Signed)
Safety precautions to be maintained throughout the outpatient stay will include: orient to surroundings, keep bed in low position, maintain call bell within reach at all times, provide assistance with transfer out of bed and ambulation.  

## 2022-09-09 ENCOUNTER — Other Ambulatory Visit: Payer: Self-pay

## 2022-09-09 DIAGNOSIS — M5414 Radiculopathy, thoracic region: Secondary | ICD-10-CM

## 2022-09-09 DIAGNOSIS — M542 Cervicalgia: Secondary | ICD-10-CM

## 2022-09-09 DIAGNOSIS — G8929 Other chronic pain: Secondary | ICD-10-CM

## 2022-09-09 NOTE — Progress Notes (Signed)
Order has been placed for PT.

## 2022-09-20 NOTE — Progress Notes (Unsigned)
Patient ran out of blood pressure medication. Comes in with uncontrolled hypertension w/ diastolic's > 100. Patient rescheduled and instructed to call PCP for BP meds.

## 2022-09-21 ENCOUNTER — Ambulatory Visit: Payer: BLUE CROSS/BLUE SHIELD | Attending: Pain Medicine | Admitting: Pain Medicine

## 2022-09-21 ENCOUNTER — Encounter: Payer: Self-pay | Admitting: Pain Medicine

## 2022-09-21 VITALS — BP 178/96 | HR 69 | Temp 97.9°F | Resp 16 | Ht 65.0 in | Wt 215.0 lb

## 2022-09-21 DIAGNOSIS — M5137 Other intervertebral disc degeneration, lumbosacral region: Secondary | ICD-10-CM

## 2022-09-21 DIAGNOSIS — I10 Essential (primary) hypertension: Secondary | ICD-10-CM | POA: Diagnosis present

## 2022-09-21 DIAGNOSIS — G8929 Other chronic pain: Secondary | ICD-10-CM

## 2022-09-21 DIAGNOSIS — M5459 Other low back pain: Secondary | ICD-10-CM

## 2022-09-21 DIAGNOSIS — M47817 Spondylosis without myelopathy or radiculopathy, lumbosacral region: Secondary | ICD-10-CM

## 2022-09-21 MED ORDER — FENTANYL CITRATE (PF) 100 MCG/2ML IJ SOLN
INTRAMUSCULAR | Status: AC
  Filled 2022-09-21: qty 2

## 2022-09-21 MED ORDER — LIDOCAINE HCL 2 % IJ SOLN
INTRAMUSCULAR | Status: AC
  Filled 2022-09-21: qty 20

## 2022-09-21 MED ORDER — ROPIVACAINE HCL 2 MG/ML IJ SOLN
INTRAMUSCULAR | Status: AC
  Filled 2022-09-21: qty 20

## 2022-09-21 MED ORDER — TRIAMCINOLONE ACETONIDE 40 MG/ML IJ SUSP
INTRAMUSCULAR | Status: AC
  Filled 2022-09-21: qty 2

## 2022-09-21 MED ORDER — MIDAZOLAM HCL 5 MG/5ML IJ SOLN
INTRAMUSCULAR | Status: AC
  Filled 2022-09-21: qty 5

## 2022-09-21 NOTE — Patient Instructions (Addendum)

## 2022-09-21 NOTE — Progress Notes (Signed)
Safety precautions to be maintained throughout the outpatient stay will include: orient to surroundings, keep bed in low position, maintain call bell within reach at all times, provide assistance with transfer out of bed and ambulation.  

## 2022-09-28 ENCOUNTER — Ambulatory Visit: Payer: Medicaid Other | Attending: Neurosurgery | Admitting: Physical Therapy

## 2022-09-30 NOTE — Progress Notes (Signed)
PROVIDER NOTE: Interpretation of information contained herein should be left to medically-trained personnel. Specific patient instructions are provided elsewhere under "Patient Instructions" section of medical record. This document was created in part using STT-dictation technology, any transcriptional errors that may result from this process are unintentional.  Patient: Suzann L Matlack Type: Established DOB: [birth year] MRN: [id number] PCP: [facility]  Service: Procedure DOS: 10/05/2022 Setting: Ambulatory Location: Ambulatory outpatient facility Delivery: Face-to-face Provider: Oswaldo Done, MD Specialty: Interventional Pain Management Specialty designation: 09 Location: Outpatient facility Ref. Prov.: [facility]       Interventional Therapy   Procedure: Lumbar Facet, Medial Branch Block(s) #1  Laterality: Bilateral  Level: L3, L4, L5, and S1 Medial Branch Level(s). Injecting these levels blocks the L4-5 and L5-S1 lumbar facet joints. Imaging: Fluoroscopic guidance Spinal ([id number]) Anesthesia: Local anesthesia (1-2% Lidocaine) Anxiolysis: IV Versed 3.0 mg Sedation: Moderate Sedation Fentanyl 1 mL (50 mcg) DOS: 10/05/2022 Performed by: Oswaldo Done, MD  Primary Purpose: Diagnostic/Therapeutic Indications: Low back pain severe enough to impact quality of life or function. 1. Chronic low back pain (1ry area of Pain) (Bilateral) (L>R) w/o sciatica   2. Lumbar facet joint pain   3. Lumbosacral facet syndrome (Bilateral)   4. Spondylosis without myelopathy or radiculopathy, lumbosacral region   5. Lumbar facet hypertrophy (L4-5) (Bilateral)   6. Lumbosacral facet arthropathy   7. DDD (degenerative disc disease), lumbosacral    NAS-11 Pain score:   Pre-procedure: 8 /10   Post-procedure: 0-No pain/10     Position / Prep / Materials:  Position: Prone  Prep solution: DuraPrep (Iodine Povacrylex [0.7% available iodine] and  Isopropyl Alcohol, 74% w/w) Area Prepped: Posterolateral Lumbosacral Spine (Wide prep: From the lower border of the scapula down to the end of the tailbone and from flank to flank.)  Materials:  Tray: Block Needle(s):  Type: Spinal  Gauge (G): 22  Length: 5-in Qty: 4      H&P (Pre-op Assessment):  Ms. Imondi is a 47 y.o. (year old), female patient, seen today for interventional treatment. She  has a past surgical history that includes Tubal ligation; epigastric hernia repair (N/A, 04/18/2015); Hernia repair; Cholecystectomy (10/21/2015); ERCP (N/A, 12/21/2016); Cyst excision; Cholecystectomy (Bilateral); Cervical conization w/bx (N/A, 03/23/2017); Radical hysterectomy (04/14/2017); Salpingectomy (Bilateral, 04/14/2017); Sentinel lymph node biopsy; Abdominal hysterectomy; Lysis of adhesion (N/A, 01/01/2020); Laparoscopic salpingo oophorectomy (Right, 01/01/2020); Colonoscopy with propofol (N/A, 12/06/2020); Esophagogastroduodenoscopy (12/06/2020); Givens capsule study (N/A, 12/08/2020); and VISCERAL ANGIOGRAPHY (N/A, 12/11/2020). Ms. Rapoport has a current medication list which includes the following prescription(s): albuterol, amlodipine, and losartan, and the following Facility-Administered Medications: fentanyl. Her primarily concern today is the Back Pain (lower)  Initial Vital Signs:  Pulse/HCG Rate: 74ECG Heart Rate: 79 Temp: 97.8 F (36.6 C) Resp: 11 BP: (!) 179/97 SpO2: 99 %  BMI: Estimated body mass index is 35.78 kg/m as calculated from the following:   Height as of this encounter: 5\' 5"  (1.651 m).   Weight as of this encounter: 215 lb (97.5 kg).  Risk Assessment: Allergies: Reviewed. She is allergic to ace inhibitors.  Allergy Precautions: None required Coagulopathies: Reviewed. None identified.  Blood-thinner therapy: None at this time Active Infection(s): Reviewed. None identified. Ms. Helming is afebrile  Site Confirmation: Ms. Tewari was asked to confirm the procedure and  laterality before marking the site Procedure checklist: Completed Consent: Before the procedure and under the influence of no sedative(s), amnesic(s), or anxiolytics, the patient was informed of the treatment options, risks and possible  complications. To fulfill our ethical and legal obligations, as recommended by the American Medical Association's Code of Ethics, I have informed the patient of my clinical impression; the nature and purpose of the treatment or procedure; the risks, benefits, and possible complications of the intervention; the alternatives, including doing nothing; the risk(s) and benefit(s) of the alternative treatment(s) or procedure(s); and the risk(s) and benefit(s) of doing nothing. The patient was provided information about the general risks and possible complications associated with the procedure. These may include, but are not limited to: failure to achieve desired goals, infection, bleeding, organ or nerve damage, allergic reactions, paralysis, and death. In addition, the patient was informed of those risks and complications associated to Spine-related procedures, such as failure to decrease pain; infection (i.e.: Meningitis, epidural or intraspinal abscess); bleeding (i.e.: epidural hematoma, subarachnoid hemorrhage, or any other type of intraspinal or peri-dural bleeding); organ or nerve damage (i.e.: Any type of peripheral nerve, nerve root, or spinal cord injury) with subsequent damage to sensory, motor, and/or autonomic systems, resulting in permanent pain, numbness, and/or weakness of one or several areas of the body; allergic reactions; (i.e.: anaphylactic reaction); and/or death. Furthermore, the patient was informed of those risks and complications associated with the medications. These include, but are not limited to: allergic reactions (i.e.: anaphylactic or anaphylactoid reaction(s)); adrenal axis suppression; blood sugar elevation that in diabetics may result in  ketoacidosis or comma; water retention that in patients with history of congestive heart failure may result in shortness of breath, pulmonary edema, and decompensation with resultant heart failure; weight gain; swelling or edema; medication-induced neural toxicity; particulate matter embolism and blood vessel occlusion with resultant organ, and/or nervous system infarction; and/or aseptic necrosis of one or more joints. Finally, the patient was informed that Medicine is not an exact science; therefore, there is also the possibility of unforeseen or unpredictable risks and/or possible complications that may result in a catastrophic outcome. The patient indicated having understood very clearly. We have given the patient no guarantees and we have made no promises. Enough time was given to the patient to ask questions, all of which were answered to the patient's satisfaction. Ms. Musacchio has indicated that she wanted to continue with the procedure. Attestation: I, the ordering provider, attest that I have discussed with the patient the benefits, risks, side-effects, alternatives, likelihood of achieving goals, and potential problems during recovery for the procedure that I have provided informed consent. Date  Time: 10/05/2022  8:31 AM  Pre-Procedure Preparation:  Monitoring: As per clinic protocol. Respiration, ETCO2, SpO2, BP, heart rate and rhythm monitor placed and checked for adequate function Safety Precautions: Patient was assessed for positional comfort and pressure points before starting the procedure. Time-out: I initiated and conducted the "Time-out" before starting the procedure, as per protocol. The patient was asked to participate by confirming the accuracy of the "Time Out" information. Verification of the correct person, site, and procedure were performed and confirmed by me, the nursing staff, and the patient. "Time-out" conducted as per Joint Commission's Universal Protocol (UP.01.01.01). Time:  0915 Start Time: 0915 hrs.  Description of Procedure:          Laterality: (see above) Targeted Levels: (see above)  Safety Precautions: Aspiration looking for blood return was conducted prior to all injections. At no point did we inject any substances, as a needle was being advanced. Before injecting, the patient was told to immediately notify me if she was experiencing any new onset of "ringing in the ears, or metallic  taste in the mouth". No attempts were made at seeking any paresthesias. Safe injection practices and needle disposal techniques used. Medications properly checked for expiration dates. SDV (single dose vial) medications used. After the completion of the procedure, all disposable equipment used was discarded in the proper designated medical waste containers. Local Anesthesia: Protocol guidelines were followed. The patient was positioned over the fluoroscopy table. The area was prepped in the usual manner. The time-out was completed. The target area was identified using fluoroscopy. A 12-in long, straight, sterile hemostat was used with fluoroscopic guidance to locate the targets for each level blocked. Once located, the skin was marked with an approved surgical skin marker. Once all sites were marked, the skin (epidermis, dermis, and hypodermis), as well as deeper tissues (fat, connective tissue and muscle) were infiltrated with a small amount of a short-acting local anesthetic, loaded on a 10cc syringe with a 25G, 1.5-in  Needle. An appropriate amount of time was allowed for local anesthetics to take effect before proceeding to the next step. Local Anesthetic: Lidocaine 2.0% The unused portion of the local anesthetic was discarded in the proper designated containers. Technical description of process:   L3 Medial Branch Nerve Block (MBB): The target area for the L3 medial branch is at the junction of the postero-lateral aspect of the superior articular process and the superior, posterior,  and medial edge of the transverse process of L4. Under fluoroscopic guidance, a Quincke needle was inserted until contact was made with os over the superior postero-lateral aspect of the pedicular shadow (target area). After negative aspiration for blood, 0.5 mL of the nerve block solution was injected without difficulty or complication. The needle was removed intact. L4 Medial Branch Nerve Block (MBB): The target area for the L4 medial branch is at the junction of the postero-lateral aspect of the superior articular process and the superior, posterior, and medial edge of the transverse process of L5. Under fluoroscopic guidance, a Quincke needle was inserted until contact was made with os over the superior postero-lateral aspect of the pedicular shadow (target area). After negative aspiration for blood, 0.5 mL of the nerve block solution was injected without difficulty or complication. The needle was removed intact. L5 Medial Branch Nerve Block (MBB): The target area for the L5 medial branch is at the junction of the postero-lateral aspect of the superior articular process and the superior, posterior, and medial edge of the sacral ala. Under fluoroscopic guidance, a Quincke needle was inserted until contact was made with os over the superior postero-lateral aspect of the pedicular shadow (target area). After negative aspiration for blood, 0.5 mL of the nerve block solution was injected without difficulty or complication. The needle was removed intact. S1 Medial Branch Nerve Block (MBB): The target area for the S1 medial branch is at the posterior and inferior 6 o'clock position of the L5-S1 facet joint. Under fluoroscopic guidance, the Quincke needle inserted for the L5 MBB was redirected until contact was made with os over the inferior and postero aspect of the sacrum, at the 6 o' clock position under the L5-S1 facet joint (Target area). After negative aspiration for blood, 0.5 mL of the nerve block solution  was injected without difficulty or complication. The needle was removed intact.  Once the entire procedure was completed, the treated area was cleaned, making sure to leave some of the prepping solution back to take advantage of its long term bactericidal properties.         Illustration of the posterior  view of the lumbar spine and the posterior neural structures. Laminae of L2 through S1 are labeled. DPRL5, dorsal primary ramus of L5; DPRS1, dorsal primary ramus of S1; DPR3, dorsal primary ramus of L3; FJ, facet (zygapophyseal) joint L3-L4; I, inferior articular process of L4; LB1, lateral branch of dorsal primary ramus of L1; IAB, inferior articular branches from L3 medial branch (supplies L4-L5 facet joint); IBP, intermediate branch plexus; MB3, medial branch of dorsal primary ramus of L3; NR3, third lumbar nerve root; S, superior articular process of L5; SAB, superior articular branches from L4 (supplies L4-5 facet joint also); TP3, transverse process of L3.   Facet Joint Innervation (* possible contribution)  L1-2 T12, L1 (L2*)  Medial Branch  L2-3 L1, L2 (L3*)         "          "  L3-4 L2, L3 (L4*)         "          "  L4-5 L3, L4 (L5*)         "          "  L5-S1 L4, L5, S1          "          "    Vitals:   10/05/22 0925 10/05/22 0934 10/05/22 0944 10/05/22 0954  BP: (!) 138/95 (!) 152/99 114/88 (!) 150/88  Pulse:      Resp: 13 16 16 16   Temp:  (!) 97 F (36.1 C)    SpO2: 100% 99% 100% 100%  Weight:      Height:         End Time: 0925 hrs.  Imaging Guidance (Spinal):          Type of Imaging Technique: Fluoroscopy Guidance (Spinal) Indication(s): Assistance in needle guidance and placement for procedures requiring needle placement in or near specific anatomical locations not easily accessible without such assistance. Exposure Time: Please see nurses notes. Contrast: None used. Fluoroscopic Guidance: I was personally present during the use of fluoroscopy. "Tunnel  Vision Technique" used to obtain the best possible view of the target area. Parallax error corrected before commencing the procedure. "Direction-depth-direction" technique used to introduce the needle under continuous pulsed fluoroscopy. Once target was reached, antero-posterior, oblique, and lateral fluoroscopic projection used confirm needle placement in all planes. Images permanently stored in EMR. Interpretation: No contrast injected. I personally interpreted the imaging intraoperatively. Adequate needle placement confirmed in multiple planes. Permanent images saved into the patient's record.  Post-operative Assessment:  Post-procedure Vital Signs:  Pulse/HCG Rate: 7466 Temp: (!) 97 F (36.1 C) Resp: 16 BP: (!) 150/88 SpO2: 100 %  EBL: None  Complications: No immediate post-treatment complications observed by team, or reported by patient.  Note: The patient tolerated the entire procedure well. A repeat set of vitals were taken after the procedure and the patient was kept under observation following institutional policy, for this type of procedure. Post-procedural neurological assessment was performed, showing return to baseline, prior to discharge. The patient was provided with post-procedure discharge instructions, including a section on how to identify potential problems. Should any problems arise concerning this procedure, the patient was given instructions to immediately contact us, at any time, without hesitation. In any case, we plan to contact the patient by telephone for a follow-up status report regarding this interventional procedure.  Comments:  No additional relevant information.  Plan of Care (POC)  Orders:  Orders Placed This Encounter  Procedures  LUMBAR FACET(MEDIAL BRANCH NERVE BLOCK) MBNB    Scheduling Instructions:     Procedure: Lumbar facet block (AKA.: Lumbosacral medial branch nerve block)     Side: Bilateral     Level: L3-4, L4-5, L5-S1, and TBD Facets (L2,  L3, L4, L5, S1, and TBD Medial Branch Nerves)     Sedation: Patient's choice.     Timeframe: Today    Order Specific Question:   Where will this procedure be performed?    Answer:   [facility]   DG PAIN [facility] C-ARM 1-60 MIN NO REPORT    Intraoperative interpretation by procedural physician at [facility].    Standing Status:   Standing    Number of Occurrences:   1    Order Specific Question:   Reason for exam:    Answer:   Assistance in needle guidance and placement for procedures requiring needle placement in or near specific anatomical locations not easily accessible without such assistance.   Informed Consent Details: Physician/Practitioner Attestation; Transcribe to consent form and obtain patient signature    Nursing Order: Transcribe to consent form and obtain patient signature. Note: Always confirm laterality of pain with Ms. Reichenberger, before procedure.    Order Specific Question:   Physician/Practitioner attestation of informed consent for procedure/surgical case    Answer:   I, the physician/practitioner, attest that I have discussed with the patient the benefits, risks, side effects, alternatives, likelihood of achieving goals and potential problems during recovery for the procedure that I have provided informed consent.    Order Specific Question:   Procedure    Answer:   Lumbar Facet Block  under fluoroscopic guidance    Order Specific Question:   Physician/Practitioner performing the procedure    Answer:   Primitivo Merkey A. Laban Emperor MD    Order Specific Question:   Indication/Reason    Answer:   Low Back Pain, with our without leg pain, due to Facet Joint Arthralgia (Joint Pain) Spondylosis (Arthritis of the Spine), without myelopathy or radiculopathy (Nerve Damage).   Provide equipment / supplies at bedside    Procedure tray: "Block Tray" (Disposable  single use) Skin infiltration needle: Regular 1.5-in, 25-G, (x1) Block Needle type: Spinal Amount/quantity:  4 Size: Medium (5-inch) Gauge: 22G    Standing Status:   Standing    Number of Occurrences:   1    Order Specific Question:   Specify    Answer:   Block Tray   Chronic Opioid Analgesic:  No chronic opioid analgesics therapy prescribed by our practice. Hydrocodone/APAP 5/325, 1 tab p.o. 4 times daily (#10) (last filled 08/08/2022) MME/day: 20 mg/day   Medications ordered for procedure: Meds ordered this encounter  Medications   lidocaine (XYLOCAINE) 2 % (with pres) injection 400 mg   pentafluoroprop-tetrafluoroeth (GEBAUERS) aerosol   lactated ringers infusion   midazolam (VERSED) 5 MG/5ML injection 0.5-2 mg    Make sure Flumazenil is available in the pyxis when using this medication. If oversedation occurs, administer 0.2 mg IV over 15 sec. If after 45 sec no response, administer 0.2 mg again over 1 min; may repeat at 1 min intervals; not to exceed 4 doses (1 mg)   fentaNYL (SUBLIMAZE) injection 25-50 mcg    Make sure Narcan is available in the pyxis when using this medication. In the event of respiratory depression (RR< 8/min): Titrate NARCAN (naloxone) in increments of 0.1 to 0.2 mg IV at 2-3 minute intervals, until desired degree of reversal.   ropivacaine (PF) 2 mg/mL (  0.2%) (NAROPIN) injection 18 mL   triamcinolone acetonide (KENALOG-40) injection 80 mg   Medications administered: We administered lidocaine, pentafluoroprop-tetrafluoroeth, lactated ringers, midazolam, fentaNYL, ropivacaine (PF) 2 mg/mL (0.2%), and triamcinolone acetonide.  See the medical record for exact dosing, route, and time of administration.  Follow-up plan:   Return in about 2 weeks (around 10/19/2022) for (Face2F), (PPE).       Interventional Therapies  Risk  Complexity Considerations:   Abnormal UDS  GERD  nicotine dependence  hypertension     Planned  Pending:   Diagnostic bilateral lumbar facet MBB #1    Under consideration:   Diagnostic bilateral lumbar facet MBB #1  Diagnostic bilateral  sacroiliac joint block #1  Diagnostic bilateral IA hip joint injection #1  Diagnostic bilateral trochanteric bursa injection #1    Completed:   Diagnostic left L4-5 LESI x2 (12/15/2021) (100/100/60 x1 week/0)  PT referral (12/29/2020).  She describes having attended twice a week for 2-3 weeks for a total of 4-5 sessions.  Not only did the physical therapy did not help the pain but it actually worsened it.   Completed by Filomena Jungling, MD [facility] PMR):   Diagnostic/therapeutic bilateral L5 TFESI x2 (06/04/2019 & 08/02/2019) (no benefit & painful)    Therapeutic  Palliative (PRN) options:   None established       Recent Visits Date Type Provider Dept  09/21/22 Procedure visit Delano Metz, MD [facility]  09/06/22 Office Visit Delano Metz, MD [facility]  Showing recent visits within past 90 days and meeting all other requirements Today's Visits Date Type Provider Dept  10/05/22 Procedure visit Delano Metz, MD [facility]  Showing today's visits and meeting all other requirements Future Appointments Date Type Provider Dept  10/19/22 Appointment Delano Metz, MD [facility]  Showing future appointments within next 90 days and meeting all other requirements  Disposition: Discharge home  Discharge (Date  Time): 10/05/2022; 0955 hrs.   Primary Care Physician: [facility] Location: [facility] Note by: Oswaldo Done, MD (TTS technology used. I apologize for any typographical errors that were not detected and corrected.) Date: 10/05/2022; Time: 10:34 AM  Disclaimer:  Medicine is not an Visual merchandiser. The only guarantee in medicine is that nothing is guaranteed. It is important to note that the decision to proceed with this intervention was based on the information collected from the patient. The Data and conclusions were drawn from the patient's  questionnaire, the interview, and the physical examination. Because the information was provided in large part by the patient, it cannot be guaranteed that it has not been purposely or unconsciously manipulated. Every effort has been made to obtain as much relevant data as possible for this evaluation. It is important to note that the conclusions that lead to this procedure are derived in large part from the available data. Always take into account that the treatment will also be dependent on availability of resources and existing treatment guidelines, considered by other Pain Management Practitioners as being common knowledge and practice, at the time of the intervention. For Medico-Legal purposes, it is also important to point out that variation in procedural techniques and pharmacological choices are the acceptable norm. The indications, contraindications, technique, and results of the above procedure should only be interpreted and judged by a Board-Certified Interventional Pain Specialist with extensive familiarity and expertise in the same exact procedure and technique.

## 2022-10-05 ENCOUNTER — Ambulatory Visit
Admission: RE | Admit: 2022-10-05 | Discharge: 2022-10-05 | Disposition: A | Discharge status: Home / Self Care | Payer: BLUE CROSS/BLUE SHIELD | Source: Ambulatory Visit | Attending: Pain Medicine | Admitting: Pain Medicine

## 2022-10-05 ENCOUNTER — Encounter: Payer: Self-pay | Admitting: Pain Medicine

## 2022-10-05 ENCOUNTER — Ambulatory Visit: Payer: BLUE CROSS/BLUE SHIELD | Attending: Pain Medicine | Admitting: Pain Medicine

## 2022-10-05 VITALS — BP 150/88 | HR 74 | Temp 97.0°F | Resp 16 | Ht 65.0 in | Wt 215.0 lb

## 2022-10-05 DIAGNOSIS — M47817 Spondylosis without myelopathy or radiculopathy, lumbosacral region: Secondary | ICD-10-CM | POA: Diagnosis present

## 2022-10-05 DIAGNOSIS — M545 Low back pain, unspecified: Secondary | ICD-10-CM | POA: Insufficient documentation

## 2022-10-05 DIAGNOSIS — G8929 Other chronic pain: Secondary | ICD-10-CM | POA: Diagnosis present

## 2022-10-05 DIAGNOSIS — M5459 Other low back pain: Secondary | ICD-10-CM | POA: Diagnosis present

## 2022-10-05 DIAGNOSIS — M47816 Spondylosis without myelopathy or radiculopathy, lumbar region: Secondary | ICD-10-CM

## 2022-10-05 DIAGNOSIS — M5137 Other intervertebral disc degeneration, lumbosacral region: Secondary | ICD-10-CM | POA: Diagnosis present

## 2022-10-05 DIAGNOSIS — M51379 Other intervertebral disc degeneration, lumbosacral region without mention of lumbar back pain or lower extremity pain: Secondary | ICD-10-CM

## 2022-10-05 MED ORDER — FENTANYL CITRATE (PF) 100 MCG/2ML IJ SOLN
25.0000 ug | INTRAMUSCULAR | Status: DC | PRN
  Administered 2022-10-05: 50 ug via INTRAVENOUS

## 2022-10-05 MED ORDER — LIDOCAINE HCL 2 % IJ SOLN
INTRAMUSCULAR | Status: AC
  Filled 2022-10-05: qty 20

## 2022-10-05 MED ORDER — MIDAZOLAM HCL 5 MG/5ML IJ SOLN
INTRAMUSCULAR | Status: AC
  Filled 2022-10-05: qty 5

## 2022-10-05 MED ORDER — MIDAZOLAM HCL 5 MG/5ML IJ SOLN
0.5000 mg | Freq: Once | INTRAMUSCULAR | Status: AC
  Administered 2022-10-05: 3 mg via INTRAVENOUS

## 2022-10-05 MED ORDER — LACTATED RINGERS IV SOLN: Freq: Once | INTRAVENOUS | Status: AC

## 2022-10-05 MED ORDER — TRIAMCINOLONE ACETONIDE 40 MG/ML IJ SUSP
80.0000 mg | Freq: Once | INTRAMUSCULAR | Status: AC
  Administered 2022-10-05: 80 mg

## 2022-10-05 MED ORDER — LIDOCAINE HCL 2 % IJ SOLN
20.0000 mL | Freq: Once | INTRAMUSCULAR | Status: AC
  Administered 2022-10-05: 400 mg

## 2022-10-05 MED ORDER — FENTANYL CITRATE (PF) 100 MCG/2ML IJ SOLN
INTRAMUSCULAR | Status: AC
  Filled 2022-10-05: qty 2

## 2022-10-05 MED ORDER — TRIAMCINOLONE ACETONIDE 40 MG/ML IJ SUSP
INTRAMUSCULAR | Status: AC
  Filled 2022-10-05: qty 2

## 2022-10-05 MED ORDER — PENTAFLUOROPROP-TETRAFLUOROETH EX AERO
INHALATION_SPRAY | Freq: Once | CUTANEOUS | Status: AC
  Administered 2022-10-05: 30 via TOPICAL
  Filled 2022-10-05: qty 30

## 2022-10-05 MED ORDER — ROPIVACAINE HCL 2 MG/ML IJ SOLN
18.0000 mL | Freq: Once | INTRAMUSCULAR | Status: AC
  Administered 2022-10-05: 18 mL via PERINEURAL

## 2022-10-05 MED ORDER — GLYCOPYRROLATE 0.2 MG/ML IJ SOLN
INTRAMUSCULAR | Status: AC
  Filled 2022-10-05: qty 1

## 2022-10-05 NOTE — Patient Instructions (Signed)

## 2022-10-05 NOTE — Progress Notes (Signed)
Safety precautions to be maintained throughout the outpatient stay will include: orient to surroundings, keep bed in low position, maintain call bell within reach at all times, provide assistance with transfer out of bed and ambulation.  

## 2022-10-06 ENCOUNTER — Telehealth: Payer: Self-pay | Admitting: *Deleted

## 2022-10-06 NOTE — Telephone Encounter (Signed)
No problems post procedure. 

## 2022-10-07 ENCOUNTER — Encounter: Payer: BLUE CROSS/BLUE SHIELD | Admitting: Physical Therapy

## 2022-10-13 ENCOUNTER — Ambulatory Visit (INDEPENDENT_AMBULATORY_CARE_PROVIDER_SITE_OTHER): Payer: BLUE CROSS/BLUE SHIELD | Admitting: Neurosurgery

## 2022-10-13 ENCOUNTER — Encounter: Payer: BLUE CROSS/BLUE SHIELD | Admitting: Physical Therapy

## 2022-10-13 ENCOUNTER — Encounter: Payer: Self-pay | Admitting: Neurosurgery

## 2022-10-13 DIAGNOSIS — M5442 Lumbago with sciatica, left side: Secondary | ICD-10-CM

## 2022-10-13 DIAGNOSIS — G8929 Other chronic pain: Secondary | ICD-10-CM

## 2022-10-13 DIAGNOSIS — M5441 Lumbago with sciatica, right side: Secondary | ICD-10-CM

## 2022-10-13 NOTE — Progress Notes (Signed)
I had a follow-up phone call today with Cassidy Mathis's.  She was at home and I was in the office.  She agreed to go forward with a phone visit to discuss her recent status.  She has reestablished care with pain provider.  She is having multiple spinal injections and spinal blocks.  Unfortunately she has not had any improvement in her back or radiating leg pain.  She still gets pain down her legs and has noticed worsening symptoms into her thighs.  She is not having any new red flag signs or symptoms.  She has not yet initiated any physical therapy and has not yet had her EMG.  We would like her to undergo her physical therapy and have an EMG to evaluate where her current pain is coming from.  Currently her MRI of the lumbar spine did not show any clear indicators on where she may be having issues generated from.  This is why we feel that the nerve conduction and EMG study would be helpful to evaluate for any peripheral neuropathy.  We like to follow-up with her after these plans.  We spent 15 is discussing her case.

## 2022-10-19 ENCOUNTER — Ambulatory Visit: Payer: BLUE CROSS/BLUE SHIELD | Attending: Pain Medicine | Admitting: Pain Medicine

## 2022-10-19 DIAGNOSIS — M5459 Other low back pain: Secondary | ICD-10-CM | POA: Diagnosis not present

## 2022-10-19 DIAGNOSIS — M79604 Pain in right leg: Secondary | ICD-10-CM | POA: Diagnosis not present

## 2022-10-19 DIAGNOSIS — G8929 Other chronic pain: Secondary | ICD-10-CM

## 2022-10-19 DIAGNOSIS — Z09 Encounter for follow-up examination after completed treatment for conditions other than malignant neoplasm: Secondary | ICD-10-CM

## 2022-10-19 DIAGNOSIS — M47817 Spondylosis without myelopathy or radiculopathy, lumbosacral region: Secondary | ICD-10-CM | POA: Diagnosis not present

## 2022-10-19 DIAGNOSIS — M545 Low back pain, unspecified: Secondary | ICD-10-CM

## 2022-10-19 DIAGNOSIS — M79605 Pain in left leg: Secondary | ICD-10-CM

## 2022-10-19 NOTE — Progress Notes (Signed)
Patient: Cassidy Mathis  Service Category: E/M  Provider: Oswaldo Done, MD  DOB: 04/15/1975  DOS: 10/19/2022  Location: Office  MRN: [id number]  Setting: Ambulatory outpatient  Referring Provider: [facility]  Type: Established Patient  Specialty: Interventional Pain Management  PCP: [facility]  Location: Remote location  Delivery: TeleHealth     Virtual Encounter - Pain Management PROVIDER NOTE: Information contained herein reflects review and annotations entered in association with encounter. Interpretation of such information and data should be left to medically-trained personnel. Information provided to patient can be located elsewhere in the medical record under "Patient Instructions". Document created using STT-dictation technology, any transcriptional errors that may result from process are unintentional.    Contact & Pharmacy Preferred: [phone] Home: [phone] (home) Mobile: [phone] (mobile) E-mail: [email or web address]  [facility] [address] [place], [place] - [address] [facility] [address] [facility] [address] [facility] [address] [place] [address] Phone: [phone] Fax: [phone]  [id number] [place], [place] - [address] AT [facility] OF [address] [place] [address] Phone: [phone] Fax: [phone]   Pre-screening  Ms. Enfield offered "in-person" vs "virtual" encounter. She indicated preferring virtual for this encounter.   Reason COVID-19*  Social distancing based on CDC and AMA recommendations.   I contacted Dannya L Mcelhinny on 10/19/2022 via telephone.      I clearly identified myself as Oswaldo Done, MD. I verified that I was speaking with the correct person using two identifiers (Name: TYA LISANTI, and date of birth: 01/27/1975).  Consent I sought verbal advanced consent from Annabelle L Longhi for virtual visit interactions. I informed Ms.  Lust of possible security and privacy concerns, risks, and limitations associated with providing "not-in-person" medical evaluation and management services. I also informed Ms. Correll of the availability of "in-person" appointments. Finally, I informed her that there would be a charge for the virtual visit and that she could be  personally, fully or partially, financially responsible for it. Ms. Nellums expressed understanding and agreed to proceed.   Historic Elements   Cassidy Mathis is a 47 y.o. year old, female patient evaluated today after our last contact on 10/05/2022. Ms. Krichbaum  has a past medical history of Allergy, Anemia, Asthma, Cancer (HCC), Depression, GERD (gastroesophageal reflux disease), Headache, Hypertension, Pancreatitis, Pilonidal cyst, Pneumonia (2018), and Sciatica. She also  has a past surgical history that includes Tubal ligation; epigastric hernia repair (N/A, 04/18/2015); Hernia repair; Cholecystectomy (10/21/2015); ERCP (N/A, 12/21/2016); Cyst excision; Cholecystectomy (Bilateral); Cervical conization w/bx (N/A, 03/23/2017); Radical hysterectomy (04/14/2017); Salpingectomy (Bilateral, 04/14/2017); Sentinel lymph node biopsy; Abdominal hysterectomy; Lysis of adhesion (N/A, 01/01/2020); Laparoscopic salpingo oophorectomy (Right, 01/01/2020); Colonoscopy with propofol (N/A, 12/06/2020); Esophagogastroduodenoscopy (12/06/2020); Givens capsule study (N/A, 12/08/2020); and VISCERAL ANGIOGRAPHY (N/A, 12/11/2020). Cassidy Mathis has a current medication list which includes the following prescription(s): albuterol, amlodipine, gabapentin, and losartan. She  reports that she quit smoking about 20 months ago. Her smoking use included cigarettes. She has a 6 pack-year smoking history. She has never used smokeless tobacco. She reports current alcohol use. She reports current drug use. Frequency: 1.00 time per week. Drug: Marijuana. Cassidy Mathis is allergic to ace inhibitors.  BMI: Estimated body mass  index is 35.78 kg/m as calculated from the following:   Height as of 10/05/22: 5\' 5"  (1.651 m).   Weight as of 10/05/22: 215 lb (97.5 kg). Last encounter: 09/06/2022. Last procedure: 10/05/2022.  HPI  Today, she is being contacted for a post-procedure assessment. Patient was scheduled to come in for face-to-face evaluation. She called and indicated having no transportation. Virtual visit offered by staff.  Today I called the patient and she indicated having attained 100% relief of the pain that lasted for approximately 6 hours after which the local anesthetic started wearing off.  She described having had the pain on the left side was good for approximately 3 days while the pain on the right side started coming back after 1 day.  Today she indicates having pain in the lower back (Bilateral) (R>L) as well as the lower extremities (Bilateral) (R>L) with both them being just as bad.  Today I took the time to explain to her that paying attention to which 1 is worse whether the back or the leg pain typically will help [place] in figuring out where the etiology of the pain resides.  I have explained to her that when the worst pain is in the lower back and the pain is primarily axial, typically the etiology resides in the posterior elements of the spine while he is at the worst pain is in the lower extremities and the pain goes all the way down into her feet, this would suggest the etiology of the pain to be either intraspinal or foraminal.  To this end, the patient again was unable to provide clearance as to which 1 seems to be worse.  According to her the pain of the right lower extremity seems to be worse than the left however the pattern of the pain on both of them he is exactly the same with the pain running all the way down to the top of the foot and into the big toe and what appears to be an L5 dermatomal distribution, bilaterally.  She indicates that the right just seems to be more intense than the left.  Today I  further attempted to investigate results obtained from the diagnostic bilateral lumbar facet block by breaking it down into right side versus left side as well as lower back versus lower extremity pain.  Interestingly enough, the patient indicated that for the initial 6 hours after the procedure both, her lower back and the lower extremity pain completely went away.  She denies having had leg numbness or weakness associated with the spread of local anesthetic into the area of the nerve roots.  This would suggest that the lower extremity pain is actually referred from the lumbar facet structures however what does not seem to fit into the equation is the dermatomal distribution of this pain since typically referred pain from the lumbar facets runs through the back of the leg to the area of the buttocks, behind the knee, or all the way down into the ankle depending on the intensity of the pain however the 1 thing that he does not do is go into the foot.  When that pain goes into the foot typically signifies a radicular component to the pain.  In this case an L5.   Today I also took the time to go over her most recent MRI in an attempt to determine whether or not there was an area that could be compressing or irritating the L5, bilaterally.  Typical choices would include bilateral lateral recess stenosis at the L4-5 level, central spinal stenosis at the L4-5 level, or bilateral L5-S1 foraminal stenosis.  In the case of the central spinal stenosis he would also involve sacral nerve roots of which currently there does  not seem to be involvement.  An EMG/PNCV would probably help in providing some additional information that could be useful in finding the exact etiology for this pain.  When comparing the results of all prior interventional therapies, we still do not get a clear picture of where this pain originates.  Prior to the patient being referred to our practice she had 2 (two) bilateral L5 transforaminal epidural  steroid injections done by Dr. Mariah Milling.  The patient indicates not remembering having attained any type of benefit from those.  In the case of the 2 (two) left L4-5 LESI injections initially provided to the patient, we have that both provided her with 100% relief of the pain for the duration of the local anesthetic followed by an additional 1 week of 80% benefit from the first injection and only 60% benefit from the second.  Today she comes in indicating that the diagnostic bilateral lumbar facet block provided her with 100% relief of the pain for the duration of the local anesthetic followed by negligible long-term benefit.  Today I had a very long conversation with the patient where she indicated having being referred to neurosurgery (Dr. Ernestine Mcmurray) for evaluation to see if there are any surgical alternatives to the treatment of this pain.  However, it would appear that the patient's insurance company wants her to go through an official physical therapy trial before approving further diagnostic or therapeutic options.  To assist this patient with this matter, today I have entered a referral to physical therapy.  I have also instructed the patient to give [place] a call after she completes the physical therapy and has had a chance to be able evaluated by neurosurgery.  Should they determine that she is not a good surgical candidate, then we could explore the possibility of doing the second diagnostic lumbar facet block since the patient indicated having attained 100% relief of the pain on both the lower back and the lower extremity pain for the duration of the local anesthetic.  She would on the second diagnostic injection the patient obtains similar results, but again no long-term benefit, she could be a candidate for radiofrequency ablation for the purpose of eliminating this component of the pain and perhaps make it easier to determine whether the rest of the pain may be coming from.  The above information  and plan was shared with the patient who understood and accepted.  Post-procedure evaluation   Procedure: Lumbar Facet, Medial Branch Block(s) #1  Laterality: Bilateral  Level: L3, L4, L5, and S1 Medial Branch Level(s). Injecting these levels blocks the L4-5 and L5-S1 lumbar facet joints. Imaging: Fluoroscopic guidance Spinal ([id number]) Anesthesia: Local anesthesia (1-2% Lidocaine) Anxiolysis: IV Versed 3.0 mg Sedation: Moderate Sedation Fentanyl 1 mL (50 mcg) DOS: 10/05/2022 Performed by: Oswaldo Done, MD  Primary Purpose: Diagnostic/Therapeutic Indications: Low back pain severe enough to impact quality of life or function. 1. Chronic low back pain (1ry area of Pain) (Bilateral) (L>R) w/o sciatica   2. Lumbar facet joint pain   3. Lumbosacral facet syndrome (Bilateral)   4. Spondylosis without myelopathy or radiculopathy, lumbosacral region   5. Lumbar facet hypertrophy (L4-5) (Bilateral)   6. Lumbosacral facet arthropathy   7. DDD (degenerative disc disease), lumbosacral    NAS-11 Pain score:   Pre-procedure: 8 /10   Post-procedure: 0-No pain/10      Effectiveness:  Initial hour after procedure: 100 %. Subsequent 4-6 hours post-procedure: 100 % (numbness lasted approx 6 hours.  left side was  good x 3 days and right side good for one day.). Analgesia past initial 6 hours: 10-20 % (but after 3rd days pain had returned to baseline.) Ongoing improvement:  Analgesic: The pain has returned back to baseline. Function: Back to baseline ROM: Back to baseline  Pharmacotherapy Assessment   Opioid Analgesic: No chronic opioid analgesics therapy prescribed by our practice. Hydrocodone/APAP 5/325, 1 tab p.o. 4 times daily (#10) (last filled 08/08/2022) MME/day: 20 mg/day   Monitoring: [place] PMP: PDMP reviewed during this encounter.       Pharmacotherapy: No side-effects or adverse reactions reported. Compliance: No problems identified. Effectiveness: Clinically  acceptable. Plan: Refer to "POC". UDS:  Summary  Date Value Ref Range Status  12/29/2020 Note  Final    Comment:    ==================================================================== Compliance Drug Analysis, Ur ==================================================================== Test                             Result       Flag       Units  Drug Present and Declared for Prescription Verification   Butalbital                     PRESENT      EXPECTED   Gabapentin                     PRESENT      EXPECTED  Drug Present not Declared for Prescription Verification   Carboxy-THC                    96           UNEXPECTED ng/mg creat    Carboxy-THC is a metabolite of tetrahydrocannabinol (THC). Source of    THC is most commonly herbal marijuana or marijuana-based products,    but THC is also present in a scheduled prescription medication.    Trace amounts of THC can be present in hemp and cannabidiol (CBD)    products. This test is not intended to distinguish between delta-9-    tetrahydrocannabinol, the predominant form of THC in most herbal or    marijuana-based products, and delta-8-tetrahydrocannabinol.  Drug Absent but Declared for Prescription Verification   Acetaminophen                  Not Detected UNEXPECTED    Acetaminophen, as indicated in the declared medication list, is not    always detected even when used as directed.  ==================================================================== Test                      Result    Flag   Units      Ref Range   Creatinine              54               mg/dL      >=27 ==================================================================== Declared Medications:  The flagging and interpretation on this report are based on the  following declared medications.  Unexpected results may arise from  inaccuracies in the declared medications.   **Note: The testing scope of this panel includes these medications:   Butalbital (Fioricet)   Gabapentin (Neurontin)   **Note: The testing scope of this panel does not include small to  moderate amounts of these reported medications:   Acetaminophen (Fioricet)   **Note: The testing scope of this panel does not include the  following reported medications:   Albuterol (Ventolin HFA)  Amlodipine (Norvasc)  Caffeine (Fioricet)  Cyanocobalamin  Esomeprazole (Nexium)  Lisinopril (Zestril) ==================================================================== For clinical consultation, please call [phone]. ====================================================================    No results found for: "CBDTHCR", "D8THCCBX", "D9THCCBX"   Laboratory Chemistry Profile   Renal Lab Results  Component Value Date   BUN 7 12/12/2020   CREATININE 0.84 12/12/2020   BCR 8 (L) 11/28/2020   GFRAA >60 05/05/2019   GFRNONAA >60 12/12/2020    Hepatic Lab Results  Component Value Date   AST 18 12/04/2020   ALT 19 12/04/2020   ALBUMIN 3.6 12/04/2020   ALKPHOS 59 12/04/2020   HCVAB 0.1 12/16/2016   LIPASE 87 (H) 12/04/2020    Electrolytes Lab Results  Component Value Date   NA 138 12/12/2020   K 3.6 12/12/2020   CL 107 12/12/2020   CALCIUM 8.3 (L) 12/12/2020   MG 2.2 12/29/2020   PHOS 3.4 12/08/2020    Bone Lab Results  Component Value Date   25OHVITD1 8.9 (L) 12/29/2020   25OHVITD2 <1.0 12/29/2020   25OHVITD3 8.9 12/29/2020    Inflammation (CRP: Acute Phase) (ESR: Chronic Phase) Lab Results  Component Value Date   CRP 8 12/29/2020   ESRSEDRATE 62 (H) 12/29/2020         Note: Above Lab results reviewed.  Imaging  DG PAIN CLINIC C-ARM 1-60 MIN NO REPORT Fluoro was used, but no Radiologist interpretation will be provided.  Please refer to "NOTES" tab for provider progress note.  Assessment  The primary encounter diagnosis was Chronic low back pain (1ry area of Pain) (Bilateral) (L>R) w/o sciatica. Diagnoses of Lumbar facet joint pain, Lumbosacral facet  syndrome (Bilateral), Chronic lower extremity pain (2ry area of Pain) (Bilateral) (L>R), and Postop check were also pertinent to this visit.  Plan of Care  Problem-specific:  No problem-specific Assessment & Plan notes found for this encounter.  Ms. JILLYN ARTUSO has a current medication list which includes the following long-term medication(s): albuterol, amlodipine, and losartan.  Pharmacotherapy (Medications Ordered): No orders of the defined types were placed in this encounter.  Orders:  Orders Placed This Encounter  Procedures   Ambulatory referral to Physical Therapy    Referral Priority:   Routine    Referral Type:   Physical Medicine    Referral Reason:   Specialty Services Required    Requested Specialty:   Physical Therapy    Number of Visits Requested:   1   Follow-up plan:   Return if symptoms worsen or fail to improve.      Interventional Therapies  Risk  Complexity Considerations:   Abnormal UDS  GERD  nicotine dependence  hypertension     Planned  Pending:   (10/19/2022) referral to physical therapy entered to again try treatment of her low back and lower extremity pain.   Under consideration:   Diagnostic bilateral lumbar facet MBB #2  Diagnostic bilateral sacroiliac joint block #1  Diagnostic bilateral IA hip joint injection #1  Diagnostic bilateral trochanteric bursa injection #1    Completed:   Diagnostic left L4-5 LESI x2 (12/15/2021) (10-0/10) (1st:100/100/80 x1 week/0) (10-0/10) (2nd:100/100/60 x1 week/0)  Diagnostic bilateral lumbar facet MBB x1 (10/05/2022) (8-0/10)  PT referral (12/29/2020).  She describes having attended twice a week for 2-3 weeks for a total of 4-5 sessions.  Not only did the physical therapy did not help the pain but it actually worsened it.   Completed by Filomena Jungling, MD [facility] PMR):  Diagnostic/therapeutic bilateral L5 TFESI x2 (06/04/2019 & 08/02/2019) (no benefit & painful)    Therapeutic  Palliative  (PRN) options:   None established      Recent Visits Date Type Provider Dept  10/05/22 Procedure visit Delano Metz, MD [facility]  09/21/22 Procedure visit Delano Metz, MD [facility]  09/06/22 Office Visit Delano Metz, MD [facility]  Showing recent visits within past 90 days and meeting all other requirements Today's Visits Date Type Provider Dept  10/19/22 Office Visit Delano Metz, MD [facility]  Showing today's visits and meeting all other requirements Future Appointments No visits were found meeting these conditions. Showing future appointments within next 90 days and meeting all other requirements  I discussed the assessment and treatment plan with the patient. The patient was provided an opportunity to ask questions and all were answered. The patient agreed with the plan and demonstrated an understanding of the instructions.  Patient advised to call back or seek an in-person evaluation if the symptoms or condition worsens.  Duration of encounter: 30 minutes.  Note by: Oswaldo Done, MD Date: 10/19/2022; Time: 5:23 PM

## 2022-10-20 ENCOUNTER — Encounter: Payer: BLUE CROSS/BLUE SHIELD | Admitting: Physical Therapy

## 2022-10-26 ENCOUNTER — Encounter: Payer: BLUE CROSS/BLUE SHIELD | Admitting: Physical Therapy

## 2022-10-28 ENCOUNTER — Encounter: Payer: BLUE CROSS/BLUE SHIELD | Admitting: Physical Therapy

## 2022-11-02 ENCOUNTER — Encounter: Payer: BLUE CROSS/BLUE SHIELD | Admitting: Physical Therapy

## 2022-11-04 ENCOUNTER — Encounter: Payer: BLUE CROSS/BLUE SHIELD | Admitting: Physical Therapy

## 2022-11-09 ENCOUNTER — Encounter: Payer: BLUE CROSS/BLUE SHIELD | Admitting: Physical Therapy

## 2022-11-11 ENCOUNTER — Encounter: Payer: BLUE CROSS/BLUE SHIELD | Admitting: Physical Therapy

## 2022-11-16 ENCOUNTER — Encounter: Payer: BLUE CROSS/BLUE SHIELD | Admitting: Physical Therapy

## 2022-11-18 ENCOUNTER — Encounter: Payer: BLUE CROSS/BLUE SHIELD | Admitting: Physical Therapy

## 2022-11-23 ENCOUNTER — Encounter: Payer: BLUE CROSS/BLUE SHIELD | Admitting: Physical Therapy

## 2022-11-25 ENCOUNTER — Encounter: Payer: BLUE CROSS/BLUE SHIELD | Admitting: Physical Therapy

## 2023-01-04 ENCOUNTER — Ambulatory Visit: Payer: BLUE CROSS/BLUE SHIELD | Attending: Neurosurgery | Admitting: Physical Therapy

## 2023-01-04 DIAGNOSIS — M6281 Muscle weakness (generalized): Secondary | ICD-10-CM | POA: Insufficient documentation

## 2023-01-04 DIAGNOSIS — G8929 Other chronic pain: Secondary | ICD-10-CM | POA: Insufficient documentation

## 2023-01-04 DIAGNOSIS — M5442 Lumbago with sciatica, left side: Secondary | ICD-10-CM | POA: Insufficient documentation

## 2023-01-04 NOTE — Therapy (Unsigned)
OUTPATIENT PHYSICAL THERAPY THORACOLUMBAR EVALUATION   Patient Name: Cassidy Mathis MRN: [id number] DOB:Jun 07, 1975, 47 y.o., female Today's Date: 01/04/2023  END OF SESSION:   Past Medical History:  Diagnosis Date   Allergy    seasonal   Anemia    h/o   Asthma    Cancer (HCC)    cervical   Depression    GERD (gastroesophageal reflux disease)    RARE-NO MEDS   Headache    MIGRAINES   Hypertension    PT STATES SHE IS SUPPOSED TO BE TAKING LISINOPRIL-HCTZ BUT HAS BEEN OUT "FOR A WHILE" NEEDS TO GET ANOTHER PRESCRIPTION FROM HER PCP   Pancreatitis    Pilonidal cyst    TOOK ANTIBIOTIC THIS MONTH AND IT IS RESOLVED   Pneumonia 2018   Sciatica    Past Surgical History:  Procedure Laterality Date   ABDOMINAL HYSTERECTOMY     partial   CERVICAL CONIZATION W/BX N/A 03/23/2017   Procedure: CONIZATION CERVIX WITH BIOPSY;  Surgeon: Artelia Laroche, MD;  Location: [facility];  Service: Gynecology;  Laterality: N/A;   CHOLECYSTECTOMY  10/21/2015   Procedure: LAPAROSCOPIC CHOLECYSTECTOMY WITH INTRAOPERATIVE CHOLANGIOGRAM;  Surgeon: Leafy Ro, MD;  Location: [facility];  Service: General;;   CHOLECYSTECTOMY Bilateral    COLONOSCOPY WITH PROPOFOL N/A 12/06/2020   Procedure: COLONOSCOPY WITH PROPOFOL;  Surgeon: Regis Bill, MD;  Location: [facility];  Service: Endoscopy;  Laterality: N/A;   CYST EXCISION     tongue AND WRIST   EPIGASTRIC HERNIA REPAIR N/A 04/18/2015   Procedure: HERNIA REPAIR EPIGASTRIC ADULT;  Surgeon: Leafy Ro, MD;  Location: [facility];  Service: General;  Laterality: N/A;   ERCP N/A 12/21/2016   Procedure: ENDOSCOPIC RETROGRADE CHOLANGIOPANCREATOGRAPHY (ERCP);  Surgeon: Midge Minium, MD;  Location: [facility];  Service: Endoscopy;  Laterality: N/A;   ESOPHAGOGASTRODUODENOSCOPY  12/06/2020   Procedure: ESOPHAGOGASTRODUODENOSCOPY (EGD);  Surgeon: Regis Bill, MD;  Location: [facility] ENDOSCOPY;  Service: Endoscopy;;   GIVENS CAPSULE  STUDY N/A 12/08/2020   Procedure: GIVENS CAPSULE STUDY;  Surgeon: Toney Reil, MD;  Location: [facility] ENDOSCOPY;  Service: Gastroenterology;  Laterality: N/A;   HERNIA REPAIR     LAPAROSCOPIC SALPINGO OOPHERECTOMY Right 01/01/2020   Procedure: LAPAROSCOPIC SALPINGO OOPHORECTOMY;  Surgeon: Schermerhorn, Ihor Austin, MD;  Location: [facility];  Service: Gynecology;  Laterality: Right;   LYSIS OF ADHESION N/A 01/01/2020   Procedure: LYSIS OF ADHESION;  Surgeon: Schermerhorn, Ihor Austin, MD;  Location: [facility];  Service: Gynecology;  Laterality: N/A;   RADICAL HYSTERECTOMY  04/14/2017   SALPINGECTOMY Bilateral 04/14/2017   SENTINEL LYMPH NODE BIOPSY     TUBAL LIGATION     VISCERAL ANGIOGRAPHY N/A 12/11/2020   Procedure: VISCERAL ANGIOGRAPHY;  Surgeon: Annice Needy, MD;  Location: [facility];  Service: Cardiovascular;  Laterality: N/A;   Patient Active Problem List   Diagnosis Date Noted   Spondylosis without myelopathy or radiculopathy, lumbosacral region 09/06/2022   Lumbar facet joint pain 08/19/2022   Chronic low back pain (Bilateral) w/ sciatica (Bilateral) 12/29/2021   Lumbosacral radiculopathy at L5 (Bilateral) (L>R) 10/28/2021   Lumbosacral radiculopathy at S1 (Bilateral) (L>R) 10/28/2021   Weakness of lower extremity (Left) 10/28/2021   Chronic hip pain (Bilateral) (L>R) 10/28/2021   Greater trochanteric bursitis of hips (Bilateral) (R>L) 10/28/2021   DDD (degenerative disc disease), lumbosacral 10/28/2021   Chronic generalized pain 10/28/2021   Osteoarthritis of knee (Left) 10/28/2021   Elevated sed rate 04/27/2021   Vitamin D deficiency  04/27/2021   Marijuana use 04/27/2021   Abnormal drug screen (12/29/2020) 04/27/2021   Chronic pain syndrome 12/29/2020   Pharmacologic therapy 12/29/2020   Disorder of skeletal system 12/29/2020   Problems influencing health status 12/29/2020   History of marijuana use 12/29/2020   Abnormal MRI, cervical spine (05/06/2019) 12/29/2020    Abnormal MRI, lumbar spine (12/06/2021) 12/29/2020   GERD (gastroesophageal reflux disease) 12/29/2020   NSAID induced gastritis 12/29/2020   Chronic low back pain (1ry area of Pain) (Bilateral) (L>R) w/o sciatica 12/29/2020   Chronic lower extremity pain (2ry area of Pain) (Bilateral) (L>R) 12/29/2020   Chronic sacroiliac joint pain (Bilateral) (R>L) 12/29/2020   Lumbosacral facet syndrome (Bilateral) 12/29/2020   Lumbar facet hypertrophy (L4-5) (Bilateral) 12/29/2020   Lumbosacral facet arthropathy 12/29/2020   Somatic dysfunction of sacroiliac joints (Bilateral) 12/29/2020   Hypokalemia 12/11/2020   Acute blood loss anemia 12/07/2020   Vitamin B12 deficiency 12/06/2020   Rectal bleeding 12/04/2020   Precordial pain 11/28/2020   Palpitations 11/28/2020   Heart murmur 11/28/2020   Uncontrolled hypertension 11/28/2020   Right ovarian cyst    Acute bilateral low back pain with bilateral sciatica 06/01/2019   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) 06/01/2019   Cervical cancer, FIGO stage IB1 (HCC) 05/17/2017   Ileus (HCC) 04/18/2017   Postoperative anemia due to acute blood loss 04/18/2017   Tobacco use 04/06/2017   Malignant neoplasm of overlapping sites of cervix (HCC) 04/06/2017   Malignant neoplasm of exocervix (HCC)    Common bile duct stone    Pancreatitis 12/16/2016   Calculus of gallbladder with chronic cholecystitis without obstruction    Incarcerated epigastric hernia     PCP: [facility]   REFERRING PROVIDER:   Delano Metz, MD    REFERRING DIAG:  M54.50,G89.29 (ICD-10-CM) - Chronic bilateral low back pain without sciatica  M79.604,M79.605,G89.29 (ICD-10-CM) - Chronic pain of lower extremity, bilateral    Rationale for Evaluation and Treatment: Rehabilitation  THERAPY DIAG:  No diagnosis found.  ONSET DATE: History of back pain since 2021  SUBJECTIVE:                                                                                                                                                                                            SUBJECTIVE STATEMENT: ***  PERTINENT HISTORY:  From 10/05/22: Procedure: Lumbar facet block (AKA.: Lumbosacral medial branch nerve block) Side: Bilateral Level: L3-4, L4-5, L5-S1, and TBD Facets (L2, L3, L4, L5, S1, and TBD Medial Branch Nerves)    PAIN:  Are you having pain? Yes: {yespain:27235::"NPRS scale: ***/10","Pain location: ***","Pain description: ***","Aggravating factors: ***","  Relieving factors: ***"}  PRECAUTIONS: {Therapy precautions:24002}  RED FLAGS: None   WEIGHT BEARING RESTRICTIONS: {Yes ***/No:24003}  FALLS:  Has patient fallen in last 6 months? {fallsyesno:27318}  LIVING ENVIRONMENT: Lives with: {[facility] lives with:25569::"lives with their family"} Lives in: {Lives in:25570} Stairs: {opstairs:27293} Has following equipment at home: {Assistive devices:23999}  OCCUPATION: ***  PLOF: {PLOF:24004}  PATIENT GOALS: ***  NEXT MD VISIT: ***  OBJECTIVE:  Note: Objective measures were completed at Evaluation unless otherwise noted.  DIAGNOSTIC FINDINGS:  From CT Lumbar apine 08/07/22: "IMPRESSION: No recent fracture is seen in the lumbar spine. There is no significant central spinal stenosis.   There is encroachment of neural foramina at multiple levels in lumbar spine and lower thoracic spine as described in the body of the report.   Aortic arteriosclerosis"  PATIENT SURVEYS:  FOTO *** STarT Back: *** Pain catastrophizing scale ( PCS) ***    COGNITION: Overall cognitive status: {cognition:24006}     SENSATION: {sensation:27233}  MUSCLE LENGTH: Hamstrings: Right *** deg; Left *** deg Thomas test: Right *** deg; Left *** deg  POSTURE: {posture:25561}  PALPATION: ***  LUMBAR ROM:   AROM eval  Flexion   Extension   Right lateral flexion   Left lateral flexion   Right rotation   Left rotation    (Blank rows  = not tested)  LOWER EXTREMITY ROM:     {AROM/PROM:27142}  Right eval Left eval  Hip flexion    Hip extension    Hip abduction    Hip adduction    Hip internal rotation    Hip external rotation    Knee flexion    Knee extension    Ankle dorsiflexion    Ankle plantarflexion    Ankle inversion    Ankle eversion     (Blank rows = not tested)  LOWER EXTREMITY MMT:    MMT Right eval Left eval  Hip flexion    Hip extension    Hip abduction    Hip adduction    Hip internal rotation    Hip external rotation    Knee flexion    Knee extension    Ankle dorsiflexion    Ankle plantarflexion    Ankle inversion    Ankle eversion     (Blank rows = not tested)  LUMBAR SPECIAL TESTS:  {lumbar special test:25242}  FUNCTIONAL TESTS:  30 seconds chair stand test 6 minute walk test: ***  GAIT: Distance walked: *** Assistive device utilized: {Assistive devices:23999} Level of assistance: {Levels of assistance:24026} Comments: ***  TODAY'S TREATMENT:                                                                                                                              DATE: Eval only    PATIENT EDUCATION:  Education details: POC Person educated: Patient Education method: Explanation Education comprehension: verbalized understanding   HOME EXERCISE PROGRAM: Establish visit 2    ASSESSMENT:  CLINICAL IMPRESSION: Patient is  a 47 y.o. female who was seen today for physical therapy evaluation and treatment for chronic low back pain and ***.   OBJECTIVE IMPAIRMENTS: Abnormal gait, decreased activity tolerance, decreased mobility, decreased ROM, decreased strength, impaired perceived functional ability, increased muscle spasms, impaired flexibility, postural dysfunction, and pain.   ACTIVITY LIMITATIONS: carrying, lifting, bending, sitting, standing, squatting, and locomotion level  PARTICIPATION LIMITATIONS: {participationrestrictions:25113}  PERSONAL FACTORS:  Time since onset of injury/illness/exacerbation and HTN, Depression, hx of cancer  are also affecting patient's functional outcome.   REHAB POTENTIAL: Fair due to chronicity and lack of response to other treatment options  CLINICAL DECISION MAKING: Stable/uncomplicated  EVALUATION COMPLEXITY: Low   GOALS: Goals reviewed with patient? Yes  SHORT TERM GOALS: Target date: 02/01/2023       Patient will be independent in home exercise program to improve strength/mobility for better functional independence with ADLs. Baseline: No HEP currently  Goal status: INITIAL   LONG TERM GOALS: Target date: 03/01/2023  ***.  Patient will complete 30 second chair stand test by 4 reps or more indicating an increased LE strength and improved balance. Baseline: *** Goal status: INITIAL  ***.  Patient will increase FOTO score to equal to or greater than  ***   to demonstrate statistically significant improvement in mobility and quality of life.  Baseline: *** Goal status: INITIAL   ***.  Patient will decrease PCS score by > 6 points to demonstrate decreased fall risk during functional activities. Baseline: *** Goal status: INITIAL  ***.  Patient will decrease STarT back score by > 3 points to demonstrate decreased fall risk during functional activities.    ***.   Patient will increase six minute walk test distance to >1000 for progression to community ambulator and improve gait ability Baseline: *** Goal status: INITIAL     PLAN:  PT FREQUENCY: 2x/week  PT DURATION: 8 weeks  PLANNED INTERVENTIONS: 97110-Therapeutic exercises, 97530- Therapeutic activity, 97112- Neuromuscular re-education, 97535- Self Care, 16109- Manual therapy, [id number]- Gait training, [id number]- Electrical stimulation (manual), [id number]- Ultrasound, [id number]- Traction (mechanical), Dry Needling, Joint mobilization, Joint manipulation, Spinal manipulation, Spinal mobilization, Cryotherapy, and Moist heat.  PLAN FOR NEXT SESSION:  ***   Norman Herrlich, PT 01/04/2023, 8:57 AM

## 2023-01-06 ENCOUNTER — Ambulatory Visit: Payer: BLUE CROSS/BLUE SHIELD | Admitting: Physical Therapy

## 2023-01-11 ENCOUNTER — Encounter: Payer: BLUE CROSS/BLUE SHIELD | Admitting: Physical Therapy

## 2023-01-12 ENCOUNTER — Encounter: Payer: Self-pay | Admitting: Physical Therapy

## 2023-01-12 ENCOUNTER — Telehealth: Payer: Self-pay | Admitting: Neurosurgery

## 2023-01-12 ENCOUNTER — Ambulatory Visit: Payer: BLUE CROSS/BLUE SHIELD | Admitting: Physical Therapy

## 2023-01-12 DIAGNOSIS — G8929 Other chronic pain: Secondary | ICD-10-CM | POA: Diagnosis present

## 2023-01-12 DIAGNOSIS — M5442 Lumbago with sciatica, left side: Secondary | ICD-10-CM | POA: Diagnosis present

## 2023-01-12 DIAGNOSIS — M6281 Muscle weakness (generalized): Secondary | ICD-10-CM

## 2023-01-12 NOTE — Telephone Encounter (Signed)
Dr.Smith told her to call once she went to her first PT appt. She went today and is in a lot of pain. Is there anything that he can prescribe her? She asked "How many sessions will I have to go to?" I told her that usually the insurance requires 1-2 visits a week for 6 weeks or the therapist can discharge her sooner if there is no improvement before the 6 weeks.

## 2023-01-12 NOTE — Therapy (Signed)
OUTPATIENT PHYSICAL THERAPY THORACOLUMBAR EVALUATION   Patient Name: Cassidy Mathis MRN: [id number] DOB:[birth year], 47 y.o., female Today's Date: 01/12/2023  END OF SESSION:  PT End of Session - 01/12/23 1622     Visit Number 1    Number of Visits 12    Date for PT Re-Evaluation 02/23/23    Progress Note Due on Visit 10    PT Start Time 0936    PT Stop Time 1013    PT Time Calculation (min) 37 min    Activity Tolerance Patient limited by pain    Behavior During Therapy WFL for tasks assessed/performed             Past Medical History:  Diagnosis Date   Allergy    seasonal   Anemia    h/o   Asthma    Cancer (HCC)    cervical   Depression    GERD (gastroesophageal reflux disease)    RARE-NO MEDS   Headache    MIGRAINES   Hypertension    PT STATES SHE IS SUPPOSED TO BE TAKING LISINOPRIL-HCTZ BUT HAS BEEN OUT "FOR A WHILE" NEEDS TO GET ANOTHER PRESCRIPTION FROM HER PCP   Pancreatitis    Pilonidal cyst    TOOK ANTIBIOTIC THIS MONTH AND IT IS RESOLVED   Pneumonia 2018   Sciatica    Past Surgical History:  Procedure Laterality Date   ABDOMINAL HYSTERECTOMY     partial   CERVICAL CONIZATION W/BX N/A 03/23/2017   Procedure: CONIZATION CERVIX WITH BIOPSY;  Surgeon: Artelia Laroche, MD;  Location: [facility];  Service: Gynecology;  Laterality: N/A;   CHOLECYSTECTOMY  10/21/2015   Procedure: LAPAROSCOPIC CHOLECYSTECTOMY WITH INTRAOPERATIVE CHOLANGIOGRAM;  Surgeon: Leafy Ro, MD;  Location: [facility];  Service: General;;   CHOLECYSTECTOMY Bilateral    COLONOSCOPY WITH PROPOFOL N/A 12/06/2020   Procedure: COLONOSCOPY WITH PROPOFOL;  Surgeon: Regis Bill, MD;  Location: [facility];  Service: Endoscopy;  Laterality: N/A;   CYST EXCISION     tongue AND WRIST   EPIGASTRIC HERNIA REPAIR N/A 04/18/2015   Procedure: HERNIA REPAIR EPIGASTRIC ADULT;  Surgeon: Leafy Ro, MD;  Location: [facility];  Service: General;  Laterality: N/A;   ERCP N/A  12/21/2016   Procedure: ENDOSCOPIC RETROGRADE CHOLANGIOPANCREATOGRAPHY (ERCP);  Surgeon: Midge Minium, MD;  Location: [facility] ENDOSCOPY;  Service: Endoscopy;  Laterality: N/A;   ESOPHAGOGASTRODUODENOSCOPY  12/06/2020   Procedure: ESOPHAGOGASTRODUODENOSCOPY (EGD);  Surgeon: Regis Bill, MD;  Location: [facility];  Service: Endoscopy;;   GIVENS CAPSULE STUDY N/A 12/08/2020   Procedure: GIVENS CAPSULE STUDY;  Surgeon: Toney Reil, MD;  Location: [facility] ENDOSCOPY;  Service: Gastroenterology;  Laterality: N/A;   HERNIA REPAIR     LAPAROSCOPIC SALPINGO OOPHERECTOMY Right 01/01/2020   Procedure: LAPAROSCOPIC SALPINGO OOPHORECTOMY;  Surgeon: Schermerhorn, Ihor Austin, MD;  Location: [facility];  Service: Gynecology;  Laterality: Right;   LYSIS OF ADHESION N/A 01/01/2020   Procedure: LYSIS OF ADHESION;  Surgeon: Schermerhorn, Ihor Austin, MD;  Location: [facility];  Service: Gynecology;  Laterality: N/A;   RADICAL HYSTERECTOMY  04/14/2017   SALPINGECTOMY Bilateral 04/14/2017   SENTINEL LYMPH NODE BIOPSY     TUBAL LIGATION     VISCERAL ANGIOGRAPHY N/A 12/11/2020   Procedure: VISCERAL ANGIOGRAPHY;  Surgeon: Annice Needy, MD;  Location: [facility];  Service: Cardiovascular;  Laterality: N/A;   Patient Active Problem List   Diagnosis Date Noted   Spondylosis without myelopathy or radiculopathy, lumbosacral region 09/06/2022   Lumbar  facet joint pain 08/19/2022   Chronic low back pain (Bilateral) w/ sciatica (Bilateral) 12/29/2021   Lumbosacral radiculopathy at L5 (Bilateral) (L>R) 10/28/2021   Lumbosacral radiculopathy at S1 (Bilateral) (L>R) 10/28/2021   Weakness of lower extremity (Left) 10/28/2021   Chronic hip pain (Bilateral) (L>R) 10/28/2021   Greater trochanteric bursitis of hips (Bilateral) (R>L) 10/28/2021   DDD (degenerative disc disease), lumbosacral 10/28/2021   Chronic generalized pain 10/28/2021   Osteoarthritis of knee (Left) 10/28/2021   Elevated sed rate 04/27/2021    Vitamin D deficiency 04/27/2021   Marijuana use 04/27/2021   Abnormal drug screen (12/29/2020) 04/27/2021   Chronic pain syndrome 12/29/2020   Pharmacologic therapy 12/29/2020   Disorder of skeletal system 12/29/2020   Problems influencing health status 12/29/2020   History of marijuana use 12/29/2020   Abnormal MRI, cervical spine (05/06/2019) 12/29/2020   Abnormal MRI, lumbar spine (12/06/2021) 12/29/2020   GERD (gastroesophageal reflux disease) 12/29/2020   NSAID induced gastritis 12/29/2020   Chronic low back pain (1ry area of Pain) (Bilateral) (L>R) w/o sciatica 12/29/2020   Chronic lower extremity pain (2ry area of Pain) (Bilateral) (L>R) 12/29/2020   Chronic sacroiliac joint pain (Bilateral) (R>L) 12/29/2020   Lumbosacral facet syndrome (Bilateral) 12/29/2020   Lumbar facet hypertrophy (L4-5) (Bilateral) 12/29/2020   Lumbosacral facet arthropathy 12/29/2020   Somatic dysfunction of sacroiliac joints (Bilateral) 12/29/2020   Hypokalemia 12/11/2020   Acute blood loss anemia 12/07/2020   Vitamin B12 deficiency 12/06/2020   Rectal bleeding 12/04/2020   Precordial pain 11/28/2020   Palpitations 11/28/2020   Heart murmur 11/28/2020   Uncontrolled hypertension 11/28/2020   Right ovarian cyst    Acute bilateral low back pain with bilateral sciatica 06/01/2019   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) 06/01/2019   Cervical cancer, FIGO stage IB1 (HCC) 05/17/2017   Ileus (HCC) 04/18/2017   Postoperative anemia due to acute blood loss 04/18/2017   Tobacco use 04/06/2017   Malignant neoplasm of overlapping sites of cervix (HCC) 04/06/2017   Malignant neoplasm of exocervix (HCC)    Common bile duct stone    Pancreatitis 12/16/2016   Calculus of gallbladder with chronic cholecystitis without obstruction    Incarcerated epigastric hernia     PCP: [facility]   REFERRING PROVIDER:   Delano Metz, MD    REFERRING DIAG:  M54.50,G89.29  (ICD-10-CM) - Chronic bilateral low back pain without sciatica  M79.604,M79.605,G89.29 (ICD-10-CM) - Chronic pain of lower extremity, bilateral    Rationale for Evaluation and Treatment: Rehabilitation  THERAPY DIAG:  Chronic bilateral low back pain with left-sided sciatica  Muscle weakness (generalized)  ONSET DATE: History of back pain since 2021  SUBJECTIVE:  SUBJECTIVE STATEMENT: Pt reports she has had sciatic problems for many years. Pt was diagnosed with DDD. Pt report she is physically unable to work and can barely walk at the end of the day. Pt is unable to rest due to the sharp pains going down her back and legs that wake her up regularly. Pt just started trying to go back to work yesterday but even just sitting in one place at a time she feels she has issues and has to frequently get up to move secondary to pain. Pt reports her facet block was grossly ineffective and once the numbing effect of the medication wore off her pain returned to it's debilitating state. Pt reports her previous therapy experience made her pain worse. Pt reports the pain when she left was worse than when she came in.  Pt can tolerate standing 5 min before needing to sit or change positions.  Pt can sit for approximately 5-10 min prior to needing to get up  Pt report she has sciatic nerve pain in both Les going down the back of her legs.  Pt stand pain: sharp pain that shoots down to her feet, she has to lean over on skink to wash dishes Sitting pain: pain begins to go down her legs.   PERTINENT HISTORY:  From 10/05/22: Procedure: Lumbar facet block (AKA.: Lumbosacral medial branch nerve block) Side: Bilateral Level: L3-4, L4-5, L5-S1, and TBD Facets (L2, L3, L4, L5, S1, and TBD Medial Branch Nerves)    PAIN:  re you  having pain? Yes: NPRS scale: 7.5/10 Pain location: back of legs and low back  Pain description: numbness, restlessness, sharp in low back  Aggravating factors: prolonged positions  Relieving factors: changing positions   PRECAUTIONS: None  RED FLAGS: None   WEIGHT BEARING RESTRICTIONS: No  FALLS:  Has patient fallen in last 6 months? 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  DIAGNOSTIC FINDINGS:  From CT Lumbar apine 08/07/22: "IMPRESSION: No recent fracture is seen in the lumbar spine. There is no significant central spinal stenosis.   There is encroachment of neural foramina at multiple levels in lumbar spine and lower thoracic spine as described in the body of the report.   Aortic arteriosclerosis"  PATIENT SURVEYS:  FOTO 22 goal 37 STarT Back: 7 Tampa Scale 11: 39/40 (does not answer one question)     COGNITION: Overall cognitive status: Within functional limits for tasks assessed     SENSATION: Not tested    PALPATION: Pt points to bilateral low back along posteriorsuperior aspect of iliac spine   LUMBAR ROM:   AROM eval  Flexion Hands to knees  Extension Min motion, only position of relief   Right lateral flexion Hand to lateral thigh then tightness  Left lateral flexion Hand to lateral thigh then tightness  Right rotation Able to turn sideways then pain increases  Left rotation Able to turn sideways then pain increases   (Blank rows = not  tested)    FUNCTIONAL TESTS:   5 x STS  4 reps in 32 sec, caused significant increase in pain on rep 3 ( attempted without UE pain)    GAIT: Distance walked: 60 ft  Assistive device utilized: None Level of assistance: Complete Independence Comments: arch collapse, slight hip drop, NBOS generally   TODAY'S TREATMENT:                                                                                                                              DATE: Eval only    PATIENT EDUCATION:  Education details: POC Person educated: Patient Education method: Explanation Education comprehension: verbalized understanding   HOME EXERCISE PROGRAM: Establish visit 2    ASSESSMENT:  CLINICAL IMPRESSION: Patient is a 47 y.o. female who was seen today for physical therapy evaluation and treatment for chronic low back pain.  Patient has high risk for chronic pain as result of her low back pain will benefit from some pain science education as well as general exercise built into her physical therapy program.  Patient demonstrates significant limitations based on 5 times sit to stand and based on her current mobility limitations.  Patient is currently unable to sit or stand for longer than 10 minutes at a time and generally experiences pain throughout her day.  Patient will benefit from skilled physical therapy to improve her low back pain as well as to improve her overall quality of life as result of her functional level.  OBJECTIVE IMPAIRMENTS: Abnormal gait, decreased activity tolerance, decreased mobility, decreased ROM, decreased strength, impaired perceived functional ability, increased muscle spasms, impaired flexibility, postural dysfunction, and pain.   ACTIVITY LIMITATIONS: carrying, lifting, bending, sitting, standing, squatting, and locomotion level  PARTICIPATION LIMITATIONS: meal prep, laundry, interpersonal relationship, shopping, community activity, and yard work  PERSONAL FACTORS: Time  since onset of injury/illness/exacerbation and HTN, Depression, hx of cancer  are also affecting patient's functional outcome.   REHAB POTENTIAL: Fair due to chronicity and lack of response to other treatment options  CLINICAL DECISION MAKING: Stable/uncomplicated  EVALUATION COMPLEXITY: Low   GOALS: Goals reviewed with patient? Yes  SHORT TERM GOALS: Target date: 02/01/2023       Patient will be independent in home exercise program to improve strength/mobility for better functional independence with ADLs. Baseline: No HEP currently  Goal status: INITIAL   LONG TERM GOALS: Target date: 03/01/2023  1.  Patient will complete 30 second chair stand test by 4 reps or more indicating an increased LE strength and improved balance. Baseline: 4 Goal status: INITIAL  2.  Patient will increase FOTO score to equal to or greater than  40   to demonstrate statistically significant improvement in mobility and quality of life.  Baseline: 27 Goal status: INITIAL   3.  Patient will decrease Tampa scale 11 score by 10 points to demonstrate improved confidence for completion of functional activities. Baseline: 39/40 Goal status: INITIAL  4.  Patient will decrease STarT back score by > 3 points to demonstrate improved  back related impairments.  Baseline: 7 Goal status: INITIAL  5.   Patient will increase six minute walk test distance to >1000 for progression to community ambulator and improve gait ability Baseline: test visit 2, don't exacerbate pain Goal status: INITIAL     PLAN:  PT FREQUENCY: 2x/week  PT DURATION: 8 weeks  PLANNED INTERVENTIONS: 97110-Therapeutic exercises, 97530- Therapeutic activity, 97112- Neuromuscular re-education, 97535- Self Care, [id number]- Manual therapy, [id number]- Gait training, [id number]- Electrical stimulation (manual), [id number]- Ultrasound, [id number]- Traction (mechanical), Dry Needling, Joint mobilization, Joint manipulation, Spinal manipulation, Spinal mobilization,  Cryotherapy, and Moist heat.  PLAN FOR NEXT SESSION: Pain neuroscience education as applicable.  Initiate early core muscle training within tolerance of pain.  Consider any manual therapies including dry needling as indicated but be wary of reliance on these methods to reduce pain so we can promote self efficacy   Norman Herrlich, PT 01/12/2023, 4:24 PM

## 2023-01-13 ENCOUNTER — Encounter: Payer: BLUE CROSS/BLUE SHIELD | Admitting: Physical Therapy

## 2023-01-13 NOTE — Telephone Encounter (Signed)
Patient notified and voiced understanding.

## 2023-01-13 NOTE — Telephone Encounter (Signed)
LMOM for patient to return call. Per Dr. Salomon Mast need to check in with her pain team for any further meds.

## 2023-01-17 ENCOUNTER — Ambulatory Visit: Payer: BLUE CROSS/BLUE SHIELD | Admitting: Physical Therapy

## 2023-01-17 ENCOUNTER — Encounter: Payer: Self-pay | Admitting: Pain Medicine

## 2023-01-17 ENCOUNTER — Ambulatory Visit: Payer: Medicaid Other | Attending: Pain Medicine | Admitting: Pain Medicine

## 2023-01-17 ENCOUNTER — Ambulatory Visit: Payer: BLUE CROSS/BLUE SHIELD

## 2023-01-17 VITALS — BP 137/94 | HR 75 | Temp 98.2°F | Resp 16 | Ht 65.0 in | Wt 215.0 lb

## 2023-01-17 DIAGNOSIS — M79604 Pain in right leg: Secondary | ICD-10-CM | POA: Diagnosis present

## 2023-01-17 DIAGNOSIS — M48061 Spinal stenosis, lumbar region without neurogenic claudication: Secondary | ICD-10-CM | POA: Diagnosis present

## 2023-01-17 DIAGNOSIS — M47817 Spondylosis without myelopathy or radiculopathy, lumbosacral region: Secondary | ICD-10-CM

## 2023-01-17 DIAGNOSIS — M5442 Lumbago with sciatica, left side: Secondary | ICD-10-CM | POA: Insufficient documentation

## 2023-01-17 DIAGNOSIS — M5441 Lumbago with sciatica, right side: Secondary | ICD-10-CM | POA: Insufficient documentation

## 2023-01-17 DIAGNOSIS — G8929 Other chronic pain: Secondary | ICD-10-CM | POA: Diagnosis present

## 2023-01-17 DIAGNOSIS — M5417 Radiculopathy, lumbosacral region: Secondary | ICD-10-CM | POA: Diagnosis present

## 2023-01-17 DIAGNOSIS — M4727 Other spondylosis with radiculopathy, lumbosacral region: Secondary | ICD-10-CM

## 2023-01-17 DIAGNOSIS — M79605 Pain in left leg: Secondary | ICD-10-CM | POA: Insufficient documentation

## 2023-01-17 DIAGNOSIS — M5459 Other low back pain: Secondary | ICD-10-CM

## 2023-01-17 DIAGNOSIS — M47816 Spondylosis without myelopathy or radiculopathy, lumbar region: Secondary | ICD-10-CM

## 2023-01-17 DIAGNOSIS — R937 Abnormal findings on diagnostic imaging of other parts of musculoskeletal system: Secondary | ICD-10-CM | POA: Diagnosis present

## 2023-01-17 NOTE — Progress Notes (Signed)
PROVIDER NOTE: Information contained herein reflects review and annotations entered in association with encounter. Interpretation of such information and data should be left to medically-trained personnel. Information provided to patient can be located elsewhere in the medical record under "Patient Instructions". Document created using STT-dictation technology, any transcriptional errors that may result from process are unintentional.    Patient: Cassidy Mathis  Service Category: E/M  Provider: Oswaldo Done, MD  DOB: 03-May-1975  DOS: 01/17/2023  Referring Provider: Center, Darcella Gasman*  MRN: [id number]  Specialty: Interventional Pain Management  PCP: [facility], [facility]  Type: Established Patient  Setting: Ambulatory outpatient    Location: Office  Delivery: Face-to-face     HPI  Ms. Naliya L Scalf, a 47 y.o. year old female, is here today because of her Chronic pain of lower extremity, bilateral [M79.604, M79.605, G89.29]. Ms. Wildfong primary complain today is Back Pain  Pertinent problems: Ms. 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); Malignant neoplasm of overlapping sites of cervix (HCC); Chronic pain syndrome; Abnormal MRI, cervical spine (05/06/2019); Abnormal MRI, lumbar spine (12/06/2021); Chronic low back pain (1ry area of Pain) (Bilateral) (L>R) w/o sciatica; Chronic lower extremity pain (2ry area of Pain) (Bilateral) (L>R); Chronic sacroiliac joint pain (Bilateral) (R>L); Lumbosacral facet syndrome (Bilateral); Lumbar facet hypertrophy (L4-5) (Bilateral); Lumbosacral facet arthropathy; Somatic dysfunction of sacroiliac joints (Bilateral); Lumbosacral radiculopathy at L5 (Bilateral) (L>R); Lumbosacral radiculopathy at S1 (Bilateral) (L>R); Weakness of lower extremity (Left); Chronic hip pain  (Bilateral) (L>R); Greater trochanteric bursitis of hips (Bilateral) (R>L); DDD (degenerative disc disease), lumbosacral; Chronic generalized pain; Osteoarthritis of knee (Left); Chronic low back pain (Bilateral) w/ sciatica (Bilateral); Lumbar facet joint pain; and Spondylosis without myelopathy or radiculopathy, lumbosacral region on their pertinent problem list. Pain Assessment: Severity of Chronic pain is reported as a 10-Worst pain ever/10. Location: Back Lower, Right, Left/down back of legs to bottom of feet bilat. Onset: More than a month ago. Quality: Sharp. Timing: Constant. Modifying factor(s): nothing his helping. Vitals:  height is 5\' 5"  (1.651 m) and weight is 215 lb (97.5 kg). Her temporal temperature is 98.2 F (36.8 C). Her blood pressure is 137/94 (abnormal) and her pulse is 75. Her respiration is 16 and oxygen saturation is 100%.  BMI: Estimated body mass index is 35.78 kg/m as calculated from the following:   Height as of this encounter: 5\' 5"  (1.651 m).   Weight as of this encounter: 215 lb (97.5 kg). Last encounter: 10/19/2022. Last procedure: 10/05/2022.  Reason for encounter: evaluation of worsening, or previously known (established) problem.   Discussed the use of AI scribe software for clinical note transcription with the patient, who gave verbal consent to proceed.  History of Present Illness   The patient presents with worsening lower back pain that is now incapacitating. The pain is diffuse across the lower back, with the left side being more tense than the right. The patient also reports experiencing pain, numbness, and weakness in both legs, with the left leg being more problematic. The pain in the back and legs is described as being equally severe, with the back pain typically initiating first. The pain in the left leg extends all the way to the foot, causing numbness. The patient describes this as a sensation of restless leg, particularly when sitting or lying  down.  The patient's MRI from 12/02/21 shows progression  of mild right foraminal narrowing at the L3, 4 level due to a broad-based disc protrusion and mild facet hypertrophy. There is also a far left annular tear at the L4, 5 level adjacent to the exiting left L4 nerve roots, and mild foraminal narrowing bilaterally at the L4, 5 level, which is worse on the left side. There is mild facet hypertrophy at the L5, S1 level without any significant disc protrusions or stenosis.  The patient reports increasing weakness in the legs, particularly when standing or walking up or down steps. This weakness is accompanied by sharp pain and is becoming a concern for potential falls. The patient also reports that the pain in the legs is becoming more intense and is now affecting the entire leg, making it difficult to pinpoint a specific area of pain.  The patient has previously undergone lumbar epidural steroid injections for leg pain and a lumbar fascia block for lower back pain. The patient is now reporting more pain in the legs, suggesting a need for further epidural treatment. The patient's BMI is currently 35.78, and she reports difficulty exercising due to chronic pain. The patient is currently on gabapentin for pain management, but reports that it is not providing sufficient relief.      Pharmacotherapy Assessment  Analgesic: No chronic opioid analgesics therapy prescribed by our practice. Hydrocodone/APAP 5/325, 1 tab p.o. 4 times daily (#10) (last filled 08/08/2022) MME/day: 20 mg/day   Monitoring: [place] PMP: PDMP reviewed during this encounter.       Pharmacotherapy: No side-effects or adverse reactions reported. Compliance: No problems identified. Effectiveness: Clinically acceptable.  Nonah Mattes, RN  01/17/2023 11:40 AM  Sign when Signing Visit Safety precautions to be maintained throughout the outpatient stay will include: orient to surroundings, keep bed in low position, maintain call bell within  reach at all times, provide assistance with transfer out of bed and ambulation.     No results found for: "CBDTHCR" No results found for: "D8THCCBX" No results found for: "D9THCCBX"  UDS:  Summary  Date Value Ref Range Status  12/29/2020 Note  Final    Comment:    ==================================================================== Compliance Drug Analysis, Ur ==================================================================== Test                             Result       Flag       Units  Drug Present and Declared for Prescription Verification   Butalbital                     PRESENT      EXPECTED   Gabapentin                     PRESENT      EXPECTED  Drug Present not Declared for Prescription Verification   Carboxy-THC                    96           UNEXPECTED ng/mg creat    Carboxy-THC is a metabolite of tetrahydrocannabinol (THC). Source of    THC is most commonly herbal marijuana or marijuana-based products,    but THC is also present in a scheduled prescription medication.    Trace amounts of THC can be present in hemp and cannabidiol (CBD)    products. This test is not intended to distinguish between delta-9-    tetrahydrocannabinol, the predominant form of  THC in most herbal or    marijuana-based products, and delta-8-tetrahydrocannabinol.  Drug Absent but Declared for Prescription Verification   Acetaminophen                  Not Detected UNEXPECTED    Acetaminophen, as indicated in the declared medication list, is not    always detected even when used as directed.  ==================================================================== Test                      Result    Flag   Units      Ref Range   Creatinine              54               mg/dL      >=44 ==================================================================== Declared Medications:  The flagging and interpretation on this report are based on the  following declared medications.  Unexpected results may  arise from  inaccuracies in the declared medications.   **Note: The testing scope of this panel includes these medications:   Butalbital (Fioricet)  Gabapentin (Neurontin)   **Note: The testing scope of this panel does not include small to  moderate amounts of these reported medications:   Acetaminophen (Fioricet)   **Note: The testing scope of this panel does not include the  following reported medications:   Albuterol (Ventolin HFA)  Amlodipine (Norvasc)  Caffeine (Fioricet)  Cyanocobalamin  Esomeprazole (Nexium)  Lisinopril (Zestril) ==================================================================== For clinical consultation, please call [phone]. ====================================================================       ROS  Constitutional: Denies any fever or chills Gastrointestinal: No reported hemesis, hematochezia, vomiting, or acute GI distress Musculoskeletal: Denies any acute onset joint swelling, redness, loss of ROM, or weakness Neurological: No reported episodes of acute onset apraxia, aphasia, dysarthria, agnosia, amnesia, paralysis, loss of coordination, or loss of consciousness  Medication Review  albuterol, amLODipine, gabapentin, and losartan  History Review  Allergy: Ms. Oborn is allergic to ace inhibitors. Drug: Ms. Brutus  reports current drug use. Frequency: 1.00 time per week. Drug: Marijuana. Alcohol:  reports current alcohol use. Tobacco:  reports that she quit smoking about 1 years ago. Her smoking use included cigarettes. She has a 6 pack-year smoking history. She has never used smokeless tobacco. Social: Ms. Stick  reports that she quit smoking about 1 years ago. Her smoking use included cigarettes. She has a 6 pack-year smoking history. She has never used smokeless tobacco. She reports current alcohol use. She reports current drug use. Frequency: 1.00 time per week. Drug: Marijuana. Medical:  has a past medical history of Allergy,  Anemia, Asthma, Cancer (HCC), Depression, GERD (gastroesophageal reflux disease), Headache, Hypertension, Pancreatitis, Pilonidal cyst, Pneumonia (2018), and Sciatica. Surgical: Ms. Quilty  has a past surgical history that includes Tubal ligation; epigastric hernia repair (N/A, 04/18/2015); Hernia repair; Cholecystectomy (10/21/2015); ERCP (N/A, 12/21/2016); Cyst excision; Cholecystectomy (Bilateral); Cervical conization w/bx (N/A, 03/23/2017); Radical hysterectomy (04/14/2017); Salpingectomy (Bilateral, 04/14/2017); Sentinel lymph node biopsy; Abdominal hysterectomy; Lysis of adhesion (N/A, 01/01/2020); Laparoscopic salpingo oophorectomy (Right, 01/01/2020); Colonoscopy with propofol (N/A, 12/06/2020); Esophagogastroduodenoscopy (12/06/2020); Givens capsule study (N/A, 12/08/2020); and VISCERAL ANGIOGRAPHY (N/A, 12/11/2020). Family: family history includes Cancer in her maternal grandfather, maternal grandmother, paternal grandfather, paternal grandmother, and paternal uncle; Gallstones in her mother; Gout in her father; Hypertension in her father and mother.  Laboratory Chemistry Profile   Renal Lab Results  Component Value Date   BUN 7 12/12/2020   CREATININE 0.84 12/12/2020   BCR 8 (  L) 11/28/2020   GFRAA >60 05/05/2019   GFRNONAA >60 12/12/2020    Hepatic Lab Results  Component Value Date   AST 18 12/04/2020   ALT 19 12/04/2020   ALBUMIN 3.6 12/04/2020   ALKPHOS 59 12/04/2020   HCVAB 0.1 12/16/2016   LIPASE 87 (H) 12/04/2020    Electrolytes Lab Results  Component Value Date   NA 138 12/12/2020   K 3.6 12/12/2020   CL 107 12/12/2020   CALCIUM 8.3 (L) 12/12/2020   MG 2.2 12/29/2020   PHOS 3.4 12/08/2020    Bone Lab Results  Component Value Date   25OHVITD1 8.9 (L) 12/29/2020   25OHVITD2 <1.0 12/29/2020   25OHVITD3 8.9 12/29/2020    Inflammation (CRP: Acute Phase) (ESR: Chronic Phase) Lab Results  Component Value Date   CRP 8 12/29/2020   ESRSEDRATE 62 (H) 12/29/2020          Note: Above Lab results reviewed.  Recent Imaging Review  DG PAIN CLINIC C-ARM 1-60 MIN NO REPORT Fluoro was used, but no Radiologist interpretation will be provided.  Please refer to "NOTES" tab for provider progress note. Note: Reviewed        Physical Exam  General appearance: Well nourished, well developed, and well hydrated. In no apparent acute distress Mental status: Alert, oriented x 3 (person, place, & time)       Respiratory: No evidence of acute respiratory distress Eyes: PERLA Vitals: BP (!) 137/94 (BP Location: Right Arm, Patient Position: Sitting, Cuff Size: Normal)   Pulse 75   Temp 98.2 F (36.8 C) (Temporal)   Resp 16   Ht 5\' 5"  (1.651 m)   Wt 215 lb (97.5 kg)   LMP 03/18/2017 (Exact Date)   SpO2 100%   BMI 35.78 kg/m  BMI: Estimated body mass index is 35.78 kg/m as calculated from the following:   Height as of this encounter: 5\' 5"  (1.651 m).   Weight as of this encounter: 215 lb (97.5 kg). Ideal: Ideal body weight: 57 kg (125 lb 10.6 oz) Adjusted ideal body weight: 73.2 kg (161 lb 6.4 oz)  Assessment   Diagnosis Status  1. Chronic lower extremity pain (2ry area of Pain) (Bilateral) (L>R)   2. Lumbosacral radiculopathy at L5 (Bilateral) (L>R)   3. Lumbosacral radiculopathy at S1 (Bilateral) (L>R)   4. 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 Abnormal MRI, lumbar spine (12/06/2021)   6. Chronic low back pain (Bilateral) w/ sciatica (Bilateral)   7. Lumbar facet joint pain   8. Lumbar facet hypertrophy (L4-5) (Bilateral)   9. Lumbosacral facet arthropathy   10. Lumbosacral facet syndrome (Bilateral)    Recurring Recurring Recurring   Updated Problems: No problems updated.  Plan of Care  Problem-specific:  Assessment and Plan    Chronic Lower Back Pain with Radiculopathy   The individual presents with chronic lower back pain with radiculopathy, primarily affecting the left leg. An MRI of the lumbar spine dated 12/02/2021 reveals  mild right foraminal narrowing at L3-4, a far left annular tear at L4-5, and mild foraminal narrowing bilaterally at L4-5, more severe on the left. She experiences sharp pain, numbness, and weakness extending to the bottom of the left foot, which worsens with standing and walking and persists with significant discomfort at rest. She has previously undergone lumbar epidural steroid injections and lumbar facet blocks, with the risks, benefits, and alternatives of these treatments thoroughly discussed. The importance of an anti-inflammatory diet and weight management has been emphasized  to reduce inflammation and alleviate symptoms, with a recommendation to achieve a BMI of less than 30 from the current 35.78. Considering conservative measures, we will administer a lumbar epidural steroid injection with sedation, advise an anti-inflammatory diet, and recommend weight management. Potential neurosurgical intervention remains an option should these conservative measures fail.       Ms. SHOSHANNA CORBAN has a current medication list which includes the following long-term medication(s): albuterol, amlodipine, and losartan.  Pharmacotherapy (Medications Ordered): No orders of the defined types were placed in this encounter.  Orders:  Orders Placed This Encounter  Procedures   Lumbar Epidural Injection    Standing Status:   Future    Expiration Date:   04/17/2023    Scheduling Instructions:     Procedure: Interlaminar Lumbar Epidural Steroid injection (LESI)  L3-4     Laterality: Left-sided     Sedation: With Sedation.     Timeframe: ASAA    Where will this procedure be performed?:   [facility]   Follow-up plan:   Return for (ECT): (L) L3-4 LESI #1.      Interventional Therapies  Risk  Complexity Considerations:   Abnormal UDS  GERD  nicotine dependence  hypertension     Planned  Pending:   Diagnostic/Therapeutic left L3-4 LESI #1  (10/19/2022) referral to physical therapy  entered to again try treatment of her low back and lower extremity pain.   Under consideration:   Diagnostic bilateral lumbar facet MBB #2  Diagnostic bilateral sacroiliac joint block #1  Diagnostic bilateral IA hip joint injection #1  Diagnostic bilateral trochanteric bursa injection #1    Completed:   Diagnostic left L4-5 LESI x2 (12/15/2021) (10-0/10) (1st:100/100/80 x1 week/0) (10-0/10) (2nd:100/100/60 x1 week/0)  Diagnostic bilateral lumbar facet MBB x1 (10/05/2022) (8-0/10) (100/100/100 x 3 days/10-20)  PT referral (12/29/2020).  She describes having attended twice a week for 2-3 weeks for a total of 4-5 sessions.  Not only did the physical therapy did not help the pain but it actually worsened it.   Completed by Filomena Jungling, MD [facility] PMR):   Diagnostic/therapeutic bilateral L5 TFESI x2 (06/04/2019 & 08/02/2019) (no benefit & painful)    Therapeutic  Palliative (PRN) options:   None established      Recent Visits Date Type Provider Dept  10/19/22 Office Visit Delano Metz, MD [facility]  Showing recent visits within past 90 days and meeting all other requirements Today's Visits Date Type Provider Dept  01/17/23 Office Visit Delano Metz, MD [facility]  Showing today's visits and meeting all other requirements Future Appointments No visits were found meeting these conditions. Showing future appointments within next 90 days and meeting all other requirements  I discussed the assessment and treatment plan with the patient. The patient was provided an opportunity to ask questions and all were answered. The patient agreed with the plan and demonstrated an understanding of the instructions.  Patient advised to call back or seek an in-person evaluation if the symptoms or condition worsens.  Duration of encounter: 30 minutes.  Total time on encounter, as per AMA guidelines included both the face-to-face and non-face-to-face time  personally spent by the physician and/or other qualified health care professional(s) on the day of the encounter (includes time in activities that require the physician or other qualified health care professional and does not include time in activities normally performed by clinical staff). Physician's time may include the following activities when performed: Preparing to see the patient (e.g.,  pre-charting review of records, searching for previously ordered imaging, lab work, and nerve conduction tests) Review of prior analgesic pharmacotherapies. Reviewing PMP Interpreting ordered tests (e.g., lab work, imaging, nerve conduction tests) Performing post-procedure evaluations, including interpretation of diagnostic procedures Obtaining and/or reviewing separately obtained history Performing a medically appropriate examination and/or evaluation Counseling and educating the patient/family/caregiver Ordering medications, tests, or procedures Referring and communicating with other health care professionals (when not separately reported) Documenting clinical information in the electronic or other health record Independently interpreting results (not separately reported) and communicating results to the patient/ family/caregiver Care coordination (not separately reported)  Note by: Oswaldo Done, MD Date: 01/17/2023; Time: 12:24 PM

## 2023-01-17 NOTE — Progress Notes (Signed)
Safety precautions to be maintained throughout the outpatient stay will include: orient to surroundings, keep bed in low position, maintain call bell within reach at all times, provide assistance with transfer out of bed and ambulation.  

## 2023-01-17 NOTE — Patient Instructions (Signed)

## 2023-01-20 ENCOUNTER — Ambulatory Visit: Payer: BLUE CROSS/BLUE SHIELD

## 2023-01-20 ENCOUNTER — Telehealth: Payer: Self-pay

## 2023-01-20 NOTE — Telephone Encounter (Signed)
Called and spoke with pt.  Pt under the impression that her appointment was on Monday, however when checking the paper, she realized she had made a mistake.  Pt reminded of next appointment for Tuesday, 01/25/23 at 9:30 AM.  Nolon Bussing, PT, DPT Physical Therapist - [facility]  01/20/23, 9:15 AM

## 2023-01-25 ENCOUNTER — Encounter: Payer: BLUE CROSS/BLUE SHIELD | Admitting: Physical Therapy

## 2023-01-25 ENCOUNTER — Ambulatory Visit: Payer: BLUE CROSS/BLUE SHIELD

## 2023-01-25 DIAGNOSIS — M6281 Muscle weakness (generalized): Secondary | ICD-10-CM

## 2023-01-25 DIAGNOSIS — M5442 Lumbago with sciatica, left side: Secondary | ICD-10-CM | POA: Diagnosis not present

## 2023-01-25 DIAGNOSIS — G8929 Other chronic pain: Secondary | ICD-10-CM

## 2023-01-25 NOTE — Therapy (Signed)
 OUTPATIENT PHYSICAL THERAPY THORACOLUMBAR TREATMENT   Patient Name: Cassidy Mathis MRN: [id number] DOB:July 02, 1975, 47 y.o., female Today's Date: 01/25/2023  END OF SESSION:  PT End of Session - 01/25/23 1103     Visit Number 2    Number of Visits 12    Date for PT Re-Evaluation 02/23/23    Progress Note Due on Visit 10    PT Start Time 1100    PT Stop Time 1145    PT Time Calculation (min) 45 min    Activity Tolerance Patient limited by pain    Behavior During Therapy WFL for tasks assessed/performed              Past Medical History:  Diagnosis Date   Allergy    seasonal   Anemia    h/o   Asthma    Cancer (HCC)    cervical   Depression    GERD (gastroesophageal reflux disease)    RARE-NO MEDS   Headache    MIGRAINES   Hypertension    PT STATES SHE IS SUPPOSED TO BE TAKING LISINOPRIL -HCTZ BUT HAS BEEN OUT FOR A WHILE NEEDS TO GET ANOTHER PRESCRIPTION FROM HER PCP   Pancreatitis    Pilonidal cyst    TOOK ANTIBIOTIC THIS MONTH AND IT IS RESOLVED   Pneumonia 2018   Sciatica    Past Surgical History:  Procedure Laterality Date   ABDOMINAL HYSTERECTOMY     partial   CERVICAL CONIZATION W/BX N/A 03/23/2017   Procedure: CONIZATION CERVIX WITH BIOPSY;  Surgeon: Elby Webb Loges, MD;  Location: [facility];  Service: Gynecology;  Laterality: N/A;   CHOLECYSTECTOMY  10/21/2015   Procedure: LAPAROSCOPIC CHOLECYSTECTOMY WITH INTRAOPERATIVE CHOLANGIOGRAM;  Surgeon: Laneta JULIANNA Luna, MD;  Location: [facility];  Service: General;;   CHOLECYSTECTOMY Bilateral    COLONOSCOPY WITH PROPOFOL  N/A 12/06/2020   Procedure: COLONOSCOPY WITH PROPOFOL ;  Surgeon: Maryruth Ole DASEN, MD;  Location: [facility];  Service: Endoscopy;  Laterality: N/A;   CYST EXCISION     tongue AND WRIST   EPIGASTRIC HERNIA REPAIR N/A 04/18/2015   Procedure: HERNIA REPAIR EPIGASTRIC ADULT;  Surgeon: Laneta JULIANNA Luna, MD;  Location: [facility];  Service: General;  Laterality: N/A;   ERCP N/A  12/21/2016   Procedure: ENDOSCOPIC RETROGRADE CHOLANGIOPANCREATOGRAPHY (ERCP);  Surgeon: Jinny Carmine, MD;  Location: [facility];  Service: Endoscopy;  Laterality: N/A;   ESOPHAGOGASTRODUODENOSCOPY  12/06/2020   Procedure: ESOPHAGOGASTRODUODENOSCOPY (EGD);  Surgeon: Maryruth Ole DASEN, MD;  Location: [facility] ENDOSCOPY;  Service: Endoscopy;;   GIVENS CAPSULE STUDY N/A 12/08/2020   Procedure: GIVENS CAPSULE STUDY;  Surgeon: Unk Corinn Skiff, MD;  Location: [facility] ENDOSCOPY;  Service: Gastroenterology;  Laterality: N/A;   HERNIA REPAIR     LAPAROSCOPIC SALPINGO OOPHERECTOMY Right 01/01/2020   Procedure: LAPAROSCOPIC SALPINGO OOPHORECTOMY;  Surgeon: Schermerhorn, Debby PARAS, MD;  Location: [facility];  Service: Gynecology;  Laterality: Right;   LYSIS OF ADHESION N/A 01/01/2020   Procedure: LYSIS OF ADHESION;  Surgeon: Schermerhorn, Debby PARAS, MD;  Location: [facility];  Service: Gynecology;  Laterality: N/A;   RADICAL HYSTERECTOMY  04/14/2017   SALPINGECTOMY Bilateral 04/14/2017   SENTINEL LYMPH NODE BIOPSY     TUBAL LIGATION     VISCERAL ANGIOGRAPHY N/A 12/11/2020   Procedure: VISCERAL ANGIOGRAPHY;  Surgeon: Marea Selinda RAMAN, MD;  Location: [facility];  Service: Cardiovascular;  Laterality: N/A;   Patient Active Problem List   Diagnosis Date Noted   Spondylosis without myelopathy or radiculopathy, lumbosacral region 09/06/2022  Lumbar facet joint pain 08/19/2022   Chronic low back pain (Bilateral) w/ sciatica (Bilateral) 12/29/2021   Lumbosacral radiculopathy at L5 (Bilateral) (L>R) 10/28/2021   Lumbosacral radiculopathy at S1 (Bilateral) (L>R) 10/28/2021   Weakness of lower extremity (Left) 10/28/2021   Chronic hip pain (Bilateral) (L>R) 10/28/2021   Greater trochanteric bursitis of hips (Bilateral) (R>L) 10/28/2021   DDD (degenerative disc disease), lumbosacral 10/28/2021   Chronic generalized pain 10/28/2021   Osteoarthritis of knee (Left) 10/28/2021   Elevated sed rate 04/27/2021    Vitamin D  deficiency 04/27/2021   Marijuana use 04/27/2021   Abnormal drug screen (12/29/2020) 04/27/2021   Chronic pain syndrome 12/29/2020   Pharmacologic therapy 12/29/2020   Disorder of skeletal system 12/29/2020   Problems influencing health status 12/29/2020   History of marijuana use 12/29/2020   Abnormal MRI, cervical spine (05/06/2019) 12/29/2020   Abnormal MRI, lumbar spine (12/06/2021) 12/29/2020   GERD (gastroesophageal reflux disease) 12/29/2020   NSAID induced gastritis 12/29/2020   Chronic low back pain (1ry area of Pain) (Bilateral) (L>R) w/o sciatica 12/29/2020   Chronic lower extremity pain (2ry area of Pain) (Bilateral) (L>R) 12/29/2020   Chronic sacroiliac joint pain (Bilateral) (R>L) 12/29/2020   Lumbosacral facet syndrome (Bilateral) 12/29/2020   Lumbar facet hypertrophy (L4-5) (Bilateral) 12/29/2020   Lumbosacral facet arthropathy 12/29/2020   Somatic dysfunction of sacroiliac joints (Bilateral) 12/29/2020   Hypokalemia 12/11/2020   Acute blood loss anemia 12/07/2020   Vitamin B12 deficiency 12/06/2020   Rectal bleeding 12/04/2020   Precordial pain 11/28/2020   Palpitations 11/28/2020   Heart murmur 11/28/2020   Uncontrolled hypertension 11/28/2020   Right ovarian cyst    Acute bilateral low back pain with bilateral sciatica 06/01/2019   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) 06/01/2019   Cervical cancer, FIGO stage IB1 (HCC) 05/17/2017   Ileus (HCC) 04/18/2017   Postoperative anemia due to acute blood loss 04/18/2017   Tobacco use 04/06/2017   Malignant neoplasm of overlapping sites of cervix (HCC) 04/06/2017   Malignant neoplasm of exocervix (HCC)    Common bile duct stone    Pancreatitis 12/16/2016   Calculus of gallbladder with chronic cholecystitis without obstruction    Incarcerated epigastric hernia     [facility]P: [facility]   REFERRING PROVIDER:  Tanya Glisson, MD  REFERRING DIAG:  M54.50,G89.29  (ICD-10-CM) - Chronic bilateral low back pain without sciatica M79.604,M79.605,G89.29 (ICD-10-CM) - Chronic pain of lower extremity, bilateral  Rationale for Evaluation and Treatment: Rehabilitation  THERAPY DIAG:  Chronic bilateral low back pain with left-sided sciatica  Muscle weakness (generalized)  ONSET DATE: History of back pain since 2021  SUBJECTIVE:  SUBJECTIVE STATEMENT: Pt reports 10/10 pain upon arrival.  Pt stating that she was unable to make her appointment earlier this morning due to not being able to sleep last night.  PERTINENT HISTORY:  From 10/05/22: Procedure: Lumbar facet block (AKA.: Lumbosacral medial branch nerve block) Side: Bilateral Level: L3-4, L4-5, L5-S1, and TBD Facets (L2, L3, L4, L5, S1, and TBD Medial Branch Nerves)   Pt reports she has had sciatic problems for many years. Pt was diagnosed with DDD. Pt report she is physically unable to work and can barely walk at the end of the day. Pt is unable to rest due to the sharp pains going down her back and legs that wake her up regularly. Pt just started trying to go back to work yesterday but even just sitting in one place at a time she feels she has issues and has to frequently get up to move secondary to pain. Pt reports her facet block was grossly ineffective and once the numbing effect of the medication wore off her pain returned to it's debilitating state. Pt reports her previous therapy experience made her pain worse. Pt reports the pain when she left was worse than when she came in.  Pt can tolerate standing 5 min before needing to sit or change positions.  Pt can sit for approximately 5-10 min prior to needing to get up  Pt report she has sciatic nerve pain in both Les going down the back of her legs.  Pt stand pain: sharp  pain that shoots down to her feet, she has to lean over on skink to wash dishes Sitting pain: pain begins to go down her legs.   PAIN:  re you having pain? Yes: NPRS scale: 7.5/10 Pain location: back of legs and low back  Pain description: numbness, restlessness, sharp in low back  Aggravating factors: prolonged positions  Relieving factors: changing positions   PRECAUTIONS: None  RED FLAGS: None   WEIGHT BEARING RESTRICTIONS: No  FALLS:  Has patient fallen in last 6 months? 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  DIAGNOSTIC FINDINGS:  From CT Lumbar apine 08/07/22: IMPRESSION: No recent fracture is seen in the lumbar spine. There is no significant central spinal stenosis.   There is encroachment of neural foramina at multiple levels in lumbar spine and lower thoracic spine as described in the body of the report.   Aortic arteriosclerosis  PATIENT SURVEYS:  FOTO 22 goal 37 STarT Back: 7 Tampa Scale 11: 39/40 (does not answer one question)     COGNITION: Overall cognitive status: Within functional limits for tasks assessed     SENSATION: Not tested    PALPATION: Pt points to bilateral low back along posteriorsuperior aspect of iliac spine   LUMBAR ROM:   AROM eval  Flexion Hands to knees  Extension Min motion, only position of relief   Right lateral flexion Hand to lateral thigh then tightness  Left lateral flexion Hand to lateral thigh then tightness  Right  rotation Able to turn sideways then  pain increases  Left rotation Able to turn sideways then pain increases   (Blank rows = not tested)    FUNCTIONAL TESTS:   5 x STS 4 reps in 32 sec, caused significant increase in pain on rep 3 ( attempted without UE pain)    GAIT: Distance walked: 60 ft  Assistive device utilized: None Level of assistance: Complete Independence Comments: arch collapse, slight hip drop, NBOS generally   TODAY'S TREATMENT: DATE: 01/25/23    TherEx:  Supine LTR, 2x10 Supine hamstring stretch with strap, 30 sec bouts x2 each LE Supine PPT with tactile feedback from hands, 3 sec holds, 2x10 Supine B hamstring stretch, bent knee, 30 sec bouts Supine B figure 4 stretch, 30 sec bouts x2 each LE Supine B piriformis stretch, 30 sec bouts x2 each LE  Hooklying TrA activation, 30 sec bouts x2 Hooklying marching with TrA activation, 2x10 each LE  Hooklying hip adduction into rainbow physioball, 3 sec holds, 2x10 Hooklying bridges with glute squeeze prior to lift off, 2x10  Seated physioball roll-outs into forward/lateral flexion, x10 each direction  Standing UE supported on wall for lumbar extension (sagging hips towards wall), 2x10  PATIENT EDUCATION:  Education details: POC Person educated: Patient Education method: Explanation Education comprehension: verbalized understanding   HOME EXERCISE PROGRAM: Establish visit 2    ASSESSMENT:  CLINICAL IMPRESSION: Pt responded fair to the treatment today, noting increased pain upon doing several of the exercises.  Pt reported a 20/10 pain upon leaving the clinic, stating she was sore and felt like she ran a marathon.  Pt with limited mobility and difficulty relaxing during the treatment approach, muscle guarding when attempting to perform stretches.   Patient will benefit from skilled physical therapy to improve her low back pain as well as to improve her overall quality of life as result of her functional  level.  OBJECTIVE IMPAIRMENTS: Abnormal gait, decreased activity tolerance, decreased mobility, decreased ROM, decreased strength, impaired perceived functional ability, increased muscle spasms, impaired flexibility, postural dysfunction, and pain.   ACTIVITY LIMITATIONS: carrying, lifting, bending, sitting, standing, squatting, and locomotion level  PARTICIPATION LIMITATIONS: meal prep, laundry, interpersonal relationship, shopping, community activity, and yard work  PERSONAL FACTORS: Time since onset of injury/illness/exacerbation and HTN, Depression, hx of cancer  are also affecting patient's functional outcome.   REHAB POTENTIAL: Fair due to chronicity and lack of response to other treatment options  CLINICAL DECISION MAKING: Stable/uncomplicated  EVALUATION COMPLEXITY: Low   GOALS: Goals reviewed with patient? Yes  SHORT TERM GOALS: Target date: 02/01/2023   Patient will be independent in home exercise program to improve strength/mobility for better functional independence with ADLs. Baseline: No HEP currently  Goal status: INITIAL   LONG TERM GOALS: Target date: 03/01/2023  1.  Patient will complete 30 second chair stand test by 4 reps or more indicating an increased LE strength and improved balance. Baseline: 4 Goal status: INITIAL  2.  Patient will increase FOTO score to equal to or greater than  40   to demonstrate statistically significant improvement in mobility and quality of life.  Baseline: 27 Goal status: INITIAL   3.  Patient will decrease Tampa scale 11 score by 10 points to demonstrate improved confidence for completion of functional activities. Baseline: 39/40 Goal status: INITIAL  4.  Patient will decrease STarT back score by > 3 points to demonstrate improved back related impairments.  Baseline: 7 Goal status: INITIAL  5.  Patient will increase six minute walk test distance to >1000  for progression to community ambulator and improve gait  ability Baseline: test visit 2, don't exacerbate pain Goal status: INITIAL   PLAN:  PT FREQUENCY: 2x/week  PT DURATION: 8 weeks  PLANNED INTERVENTIONS: 97110-Therapeutic exercises, 97530- Therapeutic activity, [id number]- Neuromuscular re-education, 97535- Self Care, [id number]- Manual therapy, [id number]- Gait training, [id number]- Electrical stimulation (manual), [id number]- Ultrasound, [id number]- Traction (mechanical), Dry Needling, Joint mobilization, Joint manipulation, Spinal manipulation, Spinal mobilization, Cryotherapy, and Moist heat.  PLAN FOR NEXT SESSION: Pain neuroscience education as applicable.  Initiate early core muscle training within tolerance of pain.  Consider any manual therapies including dry needling as indicated but be wary of reliance on these methods to reduce pain so we can promote self efficacy   Fonda Simpers, PT, DPT Physical Therapist - [facility]  01/25/23, 11:04 AM

## 2023-01-28 ENCOUNTER — Ambulatory Visit: Payer: Medicaid Other | Attending: Neurosurgery | Admitting: Physical Therapy

## 2023-01-28 ENCOUNTER — Telehealth: Payer: Self-pay | Admitting: Neurosurgery

## 2023-01-28 DIAGNOSIS — M6281 Muscle weakness (generalized): Secondary | ICD-10-CM | POA: Insufficient documentation

## 2023-01-28 DIAGNOSIS — M5442 Lumbago with sciatica, left side: Secondary | ICD-10-CM | POA: Insufficient documentation

## 2023-01-28 DIAGNOSIS — G8929 Other chronic pain: Secondary | ICD-10-CM | POA: Diagnosis present

## 2023-01-28 NOTE — Therapy (Signed)
 OUTPATIENT PHYSICAL THERAPY THORACOLUMBAR TREATMENT   Patient Name: Cassidy Mathis MRN: [id number] DOB:Apr 17, 1975, 48 y.o., female Today's Date: 01/28/2023  END OF SESSION:  PT End of Session - 01/28/23 0936     Visit Number 3    Number of Visits 12    Date for PT Re-Evaluation 02/23/23    Authorization Type Medicaid [employer]    Authorization Time Period 02/02/21 - 03/13/21    PT Start Time 0931    PT Stop Time 1011    PT Time Calculation (min) 40 min    Activity Tolerance No increased pain;Patient limited by pain    Behavior During Therapy [facility] for tasks assessed/performed              Past Medical History:  Diagnosis Date   Allergy    seasonal   Anemia    h/o   Asthma    Cancer (HCC)    cervical   Depression    GERD (gastroesophageal reflux disease)    RARE-NO MEDS   Headache    MIGRAINES   Hypertension    PT STATES SHE IS SUPPOSED TO BE TAKING LISINOPRIL -HCTZ BUT HAS BEEN OUT FOR A WHILE NEEDS TO GET ANOTHER PRESCRIPTION FROM HER PCP   Pancreatitis    Pilonidal cyst    TOOK ANTIBIOTIC THIS MONTH AND IT IS RESOLVED   Pneumonia 2018   Sciatica    Past Surgical History:  Procedure Laterality Date   ABDOMINAL HYSTERECTOMY     partial   CERVICAL CONIZATION W/BX N/A 03/23/2017   Procedure: CONIZATION CERVIX WITH BIOPSY;  Surgeon: Elby Webb Loges, MD;  Location: [facility] ORS;  Service: Gynecology;  Laterality: N/A;   CHOLECYSTECTOMY  10/21/2015   Procedure: LAPAROSCOPIC CHOLECYSTECTOMY WITH INTRAOPERATIVE CHOLANGIOGRAM;  Surgeon: Laneta JULIANNA Luna, MD;  Location: [facility];  Service: General;;   CHOLECYSTECTOMY Bilateral    COLONOSCOPY WITH PROPOFOL  N/A 12/06/2020   Procedure: COLONOSCOPY WITH PROPOFOL ;  Surgeon: Maryruth Ole DASEN, MD;  Location: [facility]OS[facility];  Service: Endoscopy;  Laterality: N/A;   CYST EXCISION     tongue AND WRIST   EPIGASTRIC HERNIA REPAIR N/A 04/18/2015   Procedure: HERNIA REPAIR EPIGASTRIC ADULT;  Surgeon: Laneta JULIANNA Luna, MD;   Location: [facility];  Service: General;  Laterality: N/A;   ERCP N/A 12/21/2016   Procedure: ENDOSCOPIC RETROGRADE CHOLANGIOPANCREATOGRAPHY (ERCP);  Surgeon: Jinny Carmine, MD;  Location: [facility] ENDOSCOPY;  Service: Endoscopy;  Laterality: N/A;   ESOPHAGOGASTRODUODENOSCOPY  12/06/2020   Procedure: ESOPHAGOGASTRODUODENOSCOPY (EGD);  Surgeon: Maryruth Ole DASEN, MD;  Location: [facility] ENDOSCOPY;  Service: Endoscopy;;   GIVENS CAPSULE STUDY N/A 12/08/2020   Procedure: GIVENS CAPSULE STUDY;  Surgeon: Unk Corinn Skiff, MD;  Location: [facility] ENDOSCOPY;  Service: Gastroenterology;  Laterality: N/A;   HERNIA REPAIR     LAPAROSCOPIC SALPINGO OOPHERECTOMY Right 01/01/2020   Procedure: LAPAROSCOPIC SALPINGO OOPHORECTOMY;  Surgeon: Schermerhorn, Debby PARAS, MD;  Location: [facility];  Service: Gynecology;  Laterality: Right;   LYSIS OF ADHESION N/A 01/01/2020   Procedure: LYSIS OF ADHESION;  Surgeon: Schermerhorn, Debby PARAS, MD;  Location: [facility];  Service: Gynecology;  Laterality: N/A;   RADICAL HYSTERECTOMY  04/14/2017   SALPINGECTOMY Bilateral 04/14/2017   SENTINEL LYMPH NODE BIOPSY     TUBAL LIGATION     VISCERAL ANGIOGRAPHY N/A 12/11/2020   Procedure: VISCERAL ANGIOGRAPHY;  Surgeon: Marea Selinda RAMAN, MD;  Location: [facility];  Service: Cardiovascular;  Laterality: N/A;   Patient Active Problem List   Diagnosis Date Noted  Spondylosis without myelopathy or radiculopathy, lumbosacral region 09/06/2022   Lumbar facet joint pain 08/19/2022   Chronic low back pain (Bilateral) w/ sciatica (Bilateral) 12/29/2021   Lumbosacral radiculopathy at L5 (Bilateral) (L>R) 10/28/2021   Lumbosacral radiculopathy at S1 (Bilateral) (L>R) 10/28/2021   Weakness of lower extremity (Left) 10/28/2021   Chronic hip pain (Bilateral) (L>R) 10/28/2021   Greater trochanteric bursitis of hips (Bilateral) (R>L) 10/28/2021   DDD (degenerative disc disease), lumbosacral 10/28/2021   Chronic generalized pain 10/28/2021    Osteoarthritis of knee (Left) 10/28/2021   Elevated sed rate 04/27/2021   Vitamin D  deficiency 04/27/2021   Marijuana use 04/27/2021   Abnormal drug screen (12/29/2020) 04/27/2021   Chronic pain syndrome 12/29/2020   Pharmacologic therapy 12/29/2020   Disorder of skeletal system 12/29/2020   Problems influencing health status 12/29/2020   History of marijuana use 12/29/2020   Abnormal MRI, cervical spine (05/06/2019) 12/29/2020   Abnormal MRI, lumbar spine (12/06/2021) 12/29/2020   GERD (gastroesophageal reflux disease) 12/29/2020   NSAID induced gastritis 12/29/2020   Chronic low back pain (1ry area of Pain) (Bilateral) (L>R) w/o sciatica 12/29/2020   Chronic lower extremity pain (2ry area of Pain) (Bilateral) (L>R) 12/29/2020   Chronic sacroiliac joint pain (Bilateral) (R>L) 12/29/2020   Lumbosacral facet syndrome (Bilateral) 12/29/2020   Lumbar facet hypertrophy (L4-5) (Bilateral) 12/29/2020   Lumbosacral facet arthropathy 12/29/2020   Somatic dysfunction of sacroiliac joints (Bilateral) 12/29/2020   Hypokalemia 12/11/2020   Acute blood loss anemia 12/07/2020   Vitamin B12 deficiency 12/06/2020   Rectal bleeding 12/04/2020   Precordial pain 11/28/2020   Palpitations 11/28/2020   Heart murmur 11/28/2020   Uncontrolled hypertension 11/28/2020   Right ovarian cyst    Acute bilateral low back pain with bilateral sciatica 06/01/2019   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) 06/01/2019   Cervical cancer, FIGO stage IB1 (HCC) 05/17/2017   Ileus (HCC) 04/18/2017   Postoperative anemia due to acute blood loss 04/18/2017   Tobacco use 04/06/2017   Malignant neoplasm of overlapping sites of cervix (HCC) 04/06/2017   Malignant neoplasm of exocervix (HCC)    Common bile duct stone    Pancreatitis 12/16/2016   Calculus of gallbladder with chronic cholecystitis without obstruction    Incarcerated epigastric hernia     PCP: [facility]   REFERRING  PROVIDER:  Tanya Glisson, MD  REFERRING DIAG:  M54.50,G89.29 (ICD-10-CM) - Chronic bilateral low back pain without sciatica M79.604,M79.605,G89.29 (ICD-10-CM) - Chronic pain of lower extremity, bilateral  Rationale for Evaluation and Treatment: Rehabilitation  THERAPY DIAG:  Chronic bilateral low back pain with left-sided sciatica  Muscle weakness (generalized)  ONSET DATE: History of back pain since 2021  SUBJECTIVE:  SUBJECTIVE STATEMENT: Pt reports being pretty sore after last session. Remains sore all the time.   PERTINENT HISTORY:  From 10/05/22: Procedure: Lumbar facet block (AKA.: Lumbosacral medial branch nerve block) Side: Bilateral Level: L3-4, L4-5, L5-S1, and TBD Facets (L2, L3, L4, L5, S1, and TBD Medial Branch Nerves)   Pt reports she has had sciatic problems for many years. Pt was diagnosed with DDD. Pt report she is physically unable to work and can barely walk at the end of the day. Pt is unable to rest due to the sharp pains going down her back and legs that wake her up regularly. Pt just started trying to go back to work yesterday but even just sitting in one place at a time she feels she has issues and has to frequently get up to move secondary to pain. Pt reports her facet block was grossly ineffective and once the numbing effect of the medication wore off her pain returned to it's debilitating state. Pt reports her previous therapy experience made her pain worse. Pt reports the pain when she left was worse than when she came in.  Pt can tolerate standing 5 min before needing to sit or change positions.  Pt can sit for approximately 5-10 min prior to needing to get up  Pt report she has sciatic nerve pain in both Les going down the back of her legs.  Pt stand pain: sharp pain  that shoots down to her feet, she has to lean over on skink to wash dishes Sitting pain: pain begins to go down her legs.   PAIN:  Are you having pain? Yes 10/10 (always 10 of 10); bilat SIJ areas, intermittent butt cheek shooting)   PRECAUTIONS: None  RED FLAGS: None   WEIGHT BEARING RESTRICTIONS: No  FALLS:  Has patient fallen in last 6 months? 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  DIAGNOSTIC FINDINGS:  From CT Lumbar apine 08/07/22: IMPRESSION: No recent fracture is seen in the lumbar spine. There is no significant central spinal stenosis.   There is encroachment of neural foramina at multiple levels in lumbar spine and lower thoracic spine as described in the body of the report.   Aortic arteriosclerosis  PATIENT SURVEYS:  FOTO 22 goal 37 STarT Back: 7 Tampa Scale 11: 39/40 (does not answer one question)   COGNITION: Overall cognitive status: Within functional limits for tasks assessed     SENSATION: Not tested  PALPATION: Pt points to bilateral low back along posteriorsuperior aspect of iliac spine   LUMBAR ROM:   AROM eval  Flexion Hands to knees  Extension Min motion, only position of relief   Right lateral flexion Hand to lateral thigh then tightness  Left lateral flexion Hand to lateral thigh then tightness  Right rotation Able to turn sideways then pain increases  Left rotation Able to turn sideways then pain increases   (Blank rows = not  tested)  FUNCTIONAL TESTS:  5 x STS 4 reps in 32 sec, caused significant increase in pain on  rep 3 ( attempted without UE pain)    GAIT: Distance walked: 60 ft  Assistive device utilized: None Level of assistance: Complete Independence Comments: arch collapse, slight hip drop, NBOS generally   TODAY'S TREATMENT: DATE: 01/28/23  -heat applied to back in chair -seated heel slides x15 BLE (slide board used for optimal leg position and friction)  -education on use of sensory TENS for pain management: TENS application (2 leads both from T9 level - PSIS   -seated brace marching (green chair, feet elevated to 4 step) 20x, alternating -seated chest press c 2lb weight bar 1x10  -seated brace marching (green chair, feet elevated to 4 step) 20x, alternating -seated chest press c 2lb weight bar 1x10   -green yoga ball squeeze 10x5 secH  -greenTB hor ABDCT 15x3secH (green ball between feet for hip joint spacing)  -green yoga ball squeeze 10x5 secH  -greenTB hor ABDCT 15x3secH (green ball between feet for hip joint spacing)   *doffed TENS  PATIENT EDUCATION:  Education details: POC Person educated: Patient Education method: Explanation Education comprehension: verbalized understanding   HOME EXERCISE PROGRAM: None assigned yet; pt needs with with symptoms management at home first.    ASSESSMENT:  CLINICAL IMPRESSION: Continued with graded movement and hip/core motor engagement in a tolerated range. Introduced sensory TENS and used heat, both to improve tolerance to activity. Pt follows cues well for exercise instruction, provides useful feedback for when symptoms are worsening. Pt remains very frustrated by persistance of her pain and lack of control over it.  Patient will benefit from skilled physical therapy to improve her low back pain as well as to improve her overall quality of life as result of her functional level.  OBJECTIVE IMPAIRMENTS: Abnormal gait, decreased activity  tolerance, decreased mobility, decreased ROM, decreased strength, impaired perceived functional ability, increased muscle spasms, impaired flexibility, postural dysfunction, and pain.   ACTIVITY LIMITATIONS: carrying, lifting, bending, sitting, standing, squatting, and locomotion level  PARTICIPATION LIMITATIONS: meal prep, laundry, interpersonal relationship, shopping, community activity, and yard work  PERSONAL FACTORS: Time since onset of injury/illness/exacerbation and HTN, Depression, hx of cancer  are also affecting patient's functional outcome.   REHAB POTENTIAL: Fair due to chronicity and lack of response to other treatment options  CLINICAL DECISION MAKING: Stable/uncomplicated  EVALUATION COMPLEXITY: Low   GOALS: Goals reviewed with patient? Yes  SHORT TERM GOALS: Target date: 02/01/2023   Patient will be independent in home exercise program to improve strength/mobility for better functional independence with ADLs. Baseline: No HEP currently  Goal status: INITIAL   LONG TERM GOALS: Target date: 03/01/2023  1.  Patient will complete 30 second chair stand test by 4 reps or more indicating an increased LE strength and improved balance. Baseline: 4 Goal status: INITIAL  2.  Patient will increase FOTO score to equal to or greater than  40   to demonstrate statistically significant improvement in mobility and quality of life.  Baseline: 27 Goal status: INITIAL   3.  Patient will decrease Tampa scale 11 score by 10 points to demonstrate improved confidence for completion of functional activities. Baseline: 39/40 Goal status: INITIAL  4.  Patient will decrease STarT back score by > 3 points to demonstrate improved back related impairments.  Baseline: 7 Goal status: INITIAL  5.  Patient will increase six minute walk test distance to >1000 for progression to community ambulator and improve gait ability Baseline: test visit 2, don't exacerbate pain Goal status:  INITIAL   PLAN:  PT FREQUENCY:  2x/week  PT DURATION: 8 weeks  PLANNED INTERVENTIONS: 97110-Therapeutic exercises, 97530- Therapeutic activity, [id number]- Neuromuscular re-education, 97535- Self Care, [id number]- Manual therapy, [id number]- Gait training, [phone]- Electrical stimulation (manual), [id number]- Ultrasound, [id number]- Traction (mechanical), Dry Needling, Joint mobilization, Joint manipulation, Spinal manipulation, Spinal mobilization, Cryotherapy, and Moist heat.  PLAN FOR NEXT SESSION:  -Continue with graded ROM/exercise as tolerated today   9:41 AM, 01/28/23 Peggye JAYSON Linear, PT, DPT Physical Therapist - [facility]  Outpatient [facility] Therapy- [facility] [phone]

## 2023-01-28 NOTE — Telephone Encounter (Signed)
 Patient is calling to let our office know that she has completed 3-4 sessions of physical therapy, but states that she is in so much pain performing the exercises and has expressed this with her physical therapist and they have adjusted the exercises. She states that even with the adjustments, she is still in a lot of pain and sometimes can barely get out of bed. She states the only medication she is taking is Gabapentin  3 times a day and is getting no relief with this. She finally states that she is scheduled for an injection on 02/03/2023. She would like to know how many more sessions of physical therapy she needs to complete what she needs to do to get some pain relief. Please advise.

## 2023-01-31 ENCOUNTER — Ambulatory Visit: Payer: Medicaid Other | Admitting: Physical Therapy

## 2023-02-01 ENCOUNTER — Encounter: Payer: BLUE CROSS/BLUE SHIELD | Admitting: Physical Therapy

## 2023-02-02 ENCOUNTER — Ambulatory Visit: Payer: Medicaid Other | Admitting: Physical Therapy

## 2023-02-02 NOTE — Progress Notes (Signed)
 PROVIDER NOTE: Interpretation of information contained herein should be left to medically-trained personnel. Specific patient instructions are provided elsewhere under Patient Instructions section of medical record. This document was created in part using STT-dictation technology, any transcriptional errors that may result from this process are unintentional.  Patient: Cassidy Mathis Type: Established DOB: 11-Jan-1976 MRN: [id number] [facility]CP: [facility]  Service: Procedure DOS: 02/03/2023 Setting: Ambulatory Location: Ambulatory outpatient facility Delivery: Face-to-face Provider: Eric DELENA Como, MD Specialty: Interventional Pain Management Specialty designation: 09 Location: Outpatient facility Ref. Prov.: [facility]       Interventional Therapy   Type: Lumbar epidural steroid injection (LESI) (interlaminar) #1    Laterality: Left   Level:  L3-4 Level.  Imaging: Fluoroscopic guidance Spinal ([id number]) Anesthesia: Local anesthesia (1-2% Lidocaine ) Anxiolysis: IV Versed  3.0 mg Sedation: Moderate Sedation Fentanyl  1 mL (50 mcg) DOS: 02/03/2023  Performed by: Eric DELENA Como, MD  Purpose: Diagnostic/Therapeutic Indications: Lumbar radicular pain of intraspinal etiology of more than 4 weeks that has failed to respond to conservative therapy and is severe enough to impact quality of life or function. 1. 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 Chronic lower extremity pain (2ry area of Pain) (Bilateral) (L>R)   3. Degeneration of intervertebral disc of lumbosacral region with discogenic back pain and lower extremity pain   4. 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 Lumbar facet hypertrophy (L4-5) (Bilateral)   6. Lumbosacral radiculopathy at L5 (Bilateral) (L>R)   7. Lumbosacral radiculopathy at S1 (Bilateral) (L>R)   8. Weakness of lower extremity (Left)   9. Abnormal MRI, lumbar spine (12/06/2021)     NAS-11 Pain score:   Pre-procedure: 10-Worst pain ever/10   Post-procedure: 0-No pain/10      Position / Prep / Materials:  Position: Prone w/ head of the table raised (slight reverse trendelenburg) to facilitate breathing.  Prep solution: ChloraPrep (2% chlorhexidine  gluconate and 70% isopropyl alcohol) Prep Area: Entire Posterior Lumbar Region from lower scapular tip down to mid buttocks area and from flank to flank. Materials:  Tray: Epidural tray Needle(s):  Type: Epidural needle (Tuohy) Gauge (G):  17 Length: Regular (3.5-in) Qty: 1   H&P (Pre-op Assessment):  Cassidy Mathis is a 48 y.o. (year old), female patient, seen today for interventional treatment. She  has a past surgical history that includes Tubal ligation; epigastric hernia repair (N/A, 04/18/2015); Hernia repair; Cholecystectomy (10/21/2015); ERCP (N/A, 12/21/2016); Cyst excision; Cholecystectomy (Bilateral); Cervical conization w/bx (N/A, 03/23/2017); Radical hysterectomy (04/14/2017); Salpingectomy (Bilateral, 04/14/2017); Sentinel lymph node biopsy; Abdominal hysterectomy; Lysis of adhesion (N/A, 01/01/2020); Laparoscopic salpingo oophorectomy (Right, 01/01/2020); Colonoscopy with propofol  (N/A, 12/06/2020); Esophagogastroduodenoscopy (12/06/2020); Givens capsule study (N/A, 12/08/2020); and VISCERAL ANGIOGRAPHY (N/A, 12/11/2020). Ms. Pieratt has a current medication list which includes the following prescription(s): albuterol , amlodipine , gabapentin , and losartan, and the following Facility-Administered Medications: fentanyl . Her primarily concern today is the Back Pain (lower)  Initial Vital Signs:  Pulse/HCG Rate: 84ECG Heart Rate: 85 (nsr) Temp: 97.6 F (36.4 C) Resp: 14 BP: (!) 138/102 SpO2: 100 %  BMI: Estimated body mass index is 35.78 kg/m as calculated from the following:   Height as of this encounter: 5' 5 (1.651 m).   Weight as of this encounter: 215 lb (97.5 kg).  Risk Assessment: Allergies: Reviewed. She  is allergic to ace inhibitors.  Allergy Precautions: None required Coagulopathies: Reviewed. None identified.  Blood-thinner therapy: None at this time Active Infection(s): Reviewed. None identified. [name] is  afebrile  Site Confirmation: Ms. Tabbert was asked to confirm the procedure and laterality before marking the site Procedure checklist: Completed Consent: Before the procedure and under the influence of no sedative(s), amnesic(s), or anxiolytics, the patient was informed of the treatment options, risks and possible complications. To fulfill our ethical and legal obligations, as recommended by the American Medical Association's Code of Ethics, I have informed the patient of my clinical impression; the nature and purpose of the treatment or procedure; the risks, benefits, and possible complications of the intervention; the alternatives, including doing nothing; the risk(s) and benefit(s) of the alternative treatment(s) or procedure(s); and the risk(s) and benefit(s) of doing nothing. The patient was provided information about the general risks and possible complications associated with the procedure. These may include, but are not limited to: failure to achieve desired goals, infection, bleeding, organ or nerve damage, allergic reactions, paralysis, and death. In addition, the patient was informed of those risks and complications associated to Spine-related procedures, such as failure to decrease pain; infection (i.e.: Meningitis, epidural or intraspinal abscess); bleeding (i.e.: epidural hematoma, subarachnoid hemorrhage, or any other type of intraspinal or peri-dural bleeding); organ or nerve damage (i.e.: Any type of peripheral nerve, nerve root, or spinal cord injury) with subsequent damage to sensory, motor, and/or autonomic systems, resulting in permanent pain, numbness, and/or weakness of one or several areas of the body; allergic reactions; (i.e.: anaphylactic reaction); and/or  death. Furthermore, the patient was informed of those risks and complications associated with the medications. These include, but are not limited to: allergic reactions (i.e.: anaphylactic or anaphylactoid reaction(s)); adrenal axis suppression; blood sugar elevation that in diabetics may result in ketoacidosis or comma; water retention that in patients with history of congestive heart failure may result in shortness of breath, pulmonary edema, and decompensation with resultant heart failure; weight gain; swelling or edema; medication-induced neural toxicity; particulate matter embolism and blood vessel occlusion with resultant organ, and/or nervous system infarction; and/or aseptic necrosis of one or more joints. Finally, the patient was informed that Medicine is not an exact science; therefore, there is also the possibility of unforeseen or unpredictable risks and/or possible complications that may result in a catastrophic outcome. The patient indicated having understood very clearly. We have given the patient no guarantees and we have made no promises. Enough time was given to the patient to ask questions, all of which were answered to the patient's satisfaction. Ms. Channing has indicated that she wanted to continue with the procedure. Attestation: I, the ordering provider, attest that I have discussed with the patient the benefits, risks, side-effects, alternatives, likelihood of achieving goals, and potential problems during recovery for the procedure that I have provided informed consent. Date  Time: 02/03/2023  9:16 AM  Pre-Procedure Preparation:  Monitoring: As per clinic protocol. Respiration, ETCO2, SpO2, BP, heart rate and rhythm monitor placed and checked for adequate function Safety Precautions: Patient was assessed for positional comfort and pressure points before starting the procedure. Time-out: I initiated and conducted the Time-out before starting the procedure, as per protocol. The patient  was asked to participate by confirming the accuracy of the Time Out information. Verification of the correct person, site, and procedure were performed and confirmed by me, the nursing staff, and the patient. Time-out conducted as per Joint Commission's Universal Protocol (UP.01.01.01). Time: 1008 Start Time: 1008 hrs.  Description/Narrative of Procedure:          Target: Epidural space via interlaminar opening, initially targeting the lower laminar border of  the superior vertebral body. Region: Lumbar Approach: Percutaneous paravertebral  Rationale (medical necessity): procedure needed and proper for the diagnosis and/or treatment of the patient's medical symptoms and needs. Procedural Technique Safety Precautions: Aspiration looking for blood return was conducted prior to all injections. At no point did we inject any substances, as a needle was being advanced. No attempts were made at seeking any paresthesias. Safe injection practices and needle disposal techniques used. Medications properly checked for expiration dates. SDV (single dose vial) medications used. Description of the Procedure: Protocol guidelines were followed. The procedure needle was introduced through the skin, ipsilateral to the reported pain, and advanced to the target area. Bone was contacted and the needle walked caudad, until the lamina was cleared. The epidural space was identified using "loss-of-resistance technique" with 2-3 ml of PF-NaCl (0.9% NSS), in a 5cc LOR glass syringe.  Vitals:   02/03/23 1011 02/03/23 1015 02/03/23 1025 02/03/23 1035  BP: 123/80 123/68 116/77 121/72  Pulse:      Resp: 17 15 16 18   Temp:      TempSrc:      SpO2: 100% 100% 100% 100%  Weight:      Height:        Start Time: 1008 hrs. End Time: 1013 hrs.  Imaging Guidance (Spinal):          Type of Imaging Technique: Fluoroscopy Guidance (Spinal) Indication(s): Fluoroscopy guidance for needle placement to enhance accuracy in  procedures requiring precise needle localization for targeted delivery of medication in or near specific anatomical locations not easily accessible without such real-time imaging assistance. Exposure Time: Please see nurses notes. Contrast: Before injecting any contrast, we confirmed that the patient did not have an allergy to iodine , shellfish, or radiological contrast. Once satisfactory needle placement was completed at the desired level, radiological contrast was injected. Contrast injected under live fluoroscopy. No contrast complications. See chart for type and volume of contrast used. Fluoroscopic Guidance: I was personally present during the use of fluoroscopy. Tunnel Vision Technique used to obtain the best possible view of the target area. Parallax error corrected before commencing the procedure. Direction-depth-direction technique used to introduce the needle under continuous pulsed fluoroscopy. Once target was reached, antero-posterior, oblique, and lateral fluoroscopic projection used confirm needle placement in all planes. Images permanently stored in EMR. Interpretation: I personally interpreted the imaging intraoperatively. Adequate needle placement confirmed in multiple planes. Appropriate spread of contrast into desired area was observed. No evidence of afferent or efferent intravascular uptake. No intrathecal or subarachnoid spread observed. Permanent images saved into the patient's record.  Antibiotic Prophylaxis:   Anti-infectives (From admission, onward)    None      Indication(s): None identified  Post-operative Assessment:  Post-procedure Vital Signs:  Pulse/HCG Rate: 8470 Temp: 97.6 F (36.4 C) Resp: 18 BP: 121/72 SpO2: 100 %  EBL: None  Complications: No immediate post-treatment complications observed by team, or reported by patient.  Note: The patient tolerated the entire procedure well. A repeat set of vitals were taken after the procedure and the patient  was kept under observation following institutional policy, for this type of procedure. Post-procedural neurological assessment was performed, showing return to baseline, prior to discharge. The patient was provided with post-procedure discharge instructions, including a section on how to identify potential problems. Should any problems arise concerning this procedure, the patient was given instructions to immediately contact us , at any time, without hesitation. In any case, we plan to contact the patient by telephone for a follow-up status  report regarding this interventional procedure.  Comments:  No additional relevant information.  Plan of Care (POC)  Orders:  Orders Placed This Encounter  Procedures   Lumbar Epidural Injection    Scheduling Instructions:     Procedure: Interlaminar LESI L3-4     Laterality: Left     Sedation: Patient's choice     Timeframe: Today    Where will this procedure be performed?:   [facility]   [facility] C-ARM 1-60 MIN NO REPORT    Intraoperative interpretation by procedural physician at [facility].    Standing Status:   Standing    Number of Occurrences:   1    Reason for exam::   Assistance in needle guidance and placement for procedures requiring needle placement in or near specific anatomical locations not easily accessible without such assistance.   Informed Consent Details: Physician/Practitioner Attestation; Transcribe to consent form and obtain patient signature    Note: Always confirm laterality of pain with Ms. Shelburne, before procedure. Transcribe to consent form and obtain patient signature.    Physician/Practitioner attestation of informed consent for procedure/surgical case:   I, the physician/practitioner, attest that I have discussed with the patient the benefits, risks, side effects, alternatives, likelihood of achieving goals and potential problems during recovery for the procedure that I have provided informed consent.     Procedure:   Lumbar epidural steroid injection under fluoroscopic guidance    Physician/Practitioner performing the procedure:   Imogene Gravelle A. Tanya, MD    Indication/Reason:   Low back and/or lower extremity pain secondary to lumbar radiculitis   Provide equipment / supplies at bedside    Procedural tray: Epidural Tray (Disposable  single use) Skin infiltration needle: Regular 1.5-in, 25-G, (x1) Block needle size: Regular standard Catheter: No catheter required    Standing Status:   Standing    Number of Occurrences:   1    Specify:   Epidural Tray   Saline lock IV    Have LR [phone] mL available and administer at 125 mL/hr if patient becomes hypotensive.    Standing Status:   Standing    Number of Occurrences:   1   Chronic Opioid Analgesic:   No chronic opioid analgesics therapy prescribed by our practice. Hydrocodone /APAP 5/325, 1 tab p.o. 4 times daily (#10) (last filled 08/08/2022) MME/day: 20 mg/day   Medications ordered for procedure: Meds ordered this encounter  Medications   iohexol  (OMNIPAQUE ) 180 MG/ML injection 10 mL    Must be Myelogram-compatible. If not available, you may substitute with a water-soluble, non-ionic, hypoallergenic, myelogram-compatible radiological contrast medium.   lidocaine  (XYLOCAINE ) 2 % (with pres) injection 400 mg   pentafluoroprop-tetrafluoroeth (GEBAUERS) aerosol   midazolam  (VERSED ) 5 MG/5ML injection 0.5-2 mg    Make sure Flumazenil is available in the pyxis when using this medication. If oversedation occurs, administer 0.2 mg IV over 15 sec. If after 45 sec no response, administer 0.2 mg again over 1 min; may repeat at 1 min intervals; not to exceed 4 doses (1 mg)   fentaNYL  (SUBLIMAZE ) injection 25-50 mcg    Make sure Narcan is available in the pyxis when using this medication. In the event of respiratory depression (RR< 8/min): Titrate NARCAN (naloxone) in increments of 0.1 to 0.2 mg IV at 2-3 minute intervals, until desired degree of  reversal.   sodium chloride  flush (NS) 0.9 % injection 2 mL   ropivacaine  (PF) 2 mg/mL (0.2%) (NAROPIN ) injection 2 mL   triamcinolone  acetonide (  KENALOG -40) injection 40 mg   Medications administered: We administered iohexol , lidocaine , pentafluoroprop-tetrafluoroeth, midazolam , fentaNYL , sodium chloride  flush, ropivacaine  (PF) 2 mg/mL (0.2%), and triamcinolone  acetonide.  See the medical record for exact dosing, route, and time of administration.  Follow-up plan:   Return in about 2 weeks (around 02/17/2023) for (Face2F), (PPE).       Interventional Therapies  Risk  Complexity Considerations:   Abnormal UDS  GERD  nicotine dependence  hypertension     Planned  Pending:   Diagnostic/Therapeutic left L3-4 LESI #1  (10/19/2022) referral to physical therapy entered to again try treatment of her low back and lower extremity pain.   Under consideration:   Diagnostic bilateral lumbar facet MBB #2  Diagnostic bilateral sacroiliac joint block #1  Diagnostic bilateral IA hip joint injection #1  Diagnostic bilateral trochanteric bursa injection #1    Completed:   Diagnostic left L4-5 LESI x2 (12/15/2021) (10-0/10) (1st:100/100/80 x1 week/0) (10-0/10) (2nd:100/100/60 x1 week/0)  Diagnostic bilateral lumbar facet MBB x1 (10/05/2022) (8-0/10) (100/100/100 x 3 days/10-20)  PT referral (12/29/2020).  She describes having attended twice a week for 2-3 weeks for a total of 4-5 sessions.  Not only did the physical therapy did not help the pain but it actually worsened it.   Completed by Delon Gins, MD [facility] PMR):   Diagnostic/therapeutic bilateral L5 TFESI x2 (06/04/2019 & 08/02/2019) (no benefit & painful)    Therapeutic  Palliative (PRN) options:   None established      Recent Visits Date Type Provider Dept  01/17/23 Office Visit Tanya Glisson, MD [facility]  Showing recent visits within past 90 days and meeting all other requirements Today's Visits Date  Type Provider Dept  02/03/23 Procedure visit Tanya Glisson, MD [facility]  Showing today's visits and meeting all other requirements Future Appointments Date Type Provider Dept  02/17/23 Appointment Tanya Glisson, MD [facility]  Showing future appointments within next 90 days and meeting all other requirements  Disposition: Discharge home  Discharge (Date  Time): 02/03/2023; 1040 hrs.   Primary Care Physician: [facility] Location: [facility] Note by: Glisson DELENA Tanya, MD (TTS technology used. I apologize for any typographical errors that were not detected and corrected.) Date: 02/03/2023; Time: 10:48 AM  Disclaimer:  Medicine is not an visual merchandiser. The only guarantee in medicine is that nothing is guaranteed. It is important to note that the decision to proceed with this intervention was based on the information collected from the patient. The Data and conclusions were drawn from the patient's questionnaire, the interview, and the physical examination. Because the information was provided in large part by the patient, it cannot be guaranteed that it has not been purposely or unconsciously manipulated. Every effort has been made to obtain as much relevant data as possible for this evaluation. It is important to note that the conclusions that lead to this procedure are derived in large part from the available data. Always take into account that the treatment will also be dependent on availability of resources and existing treatment guidelines, considered by other Pain Management Practitioners as being common knowledge and practice, at the time of the intervention. For Medico-Legal purposes, it is also important to point out that variation in procedural techniques and pharmacological choices are the acceptable norm. The indications, contraindications, technique, and results of the above procedure should only be  interpreted and judged by a Board-Certified Interventional Pain Specialist with extensive familiarity and expertise  in the same exact procedure and technique.

## 2023-02-03 ENCOUNTER — Ambulatory Visit: Payer: Medicaid Other | Admitting: Physical Therapy

## 2023-02-03 ENCOUNTER — Ambulatory Visit
Admission: RE | Admit: 2023-02-03 | Discharge: 2023-02-03 | Disposition: A | Discharge status: Home / Self Care | Payer: BLUE CROSS/BLUE SHIELD | Source: Ambulatory Visit | Attending: Pain Medicine | Admitting: Pain Medicine

## 2023-02-03 ENCOUNTER — Encounter: Payer: Self-pay | Admitting: Pain Medicine

## 2023-02-03 ENCOUNTER — Ambulatory Visit: Payer: Medicaid Other | Attending: Pain Medicine | Admitting: Pain Medicine

## 2023-02-03 ENCOUNTER — Ambulatory Visit: Payer: BLUE CROSS/BLUE SHIELD | Admitting: Physical Therapy

## 2023-02-03 VITALS — BP 121/72 | HR 84 | Temp 97.6°F | Resp 18 | Ht 65.0 in | Wt 215.0 lb

## 2023-02-03 DIAGNOSIS — M5442 Lumbago with sciatica, left side: Secondary | ICD-10-CM | POA: Diagnosis present

## 2023-02-03 DIAGNOSIS — R29898 Other symptoms and signs involving the musculoskeletal system: Secondary | ICD-10-CM | POA: Diagnosis present

## 2023-02-03 DIAGNOSIS — M51372 Other intervertebral disc degeneration, lumbosacral region with discogenic back pain and lower extremity pain: Secondary | ICD-10-CM | POA: Diagnosis present

## 2023-02-03 DIAGNOSIS — M79604 Pain in right leg: Secondary | ICD-10-CM | POA: Insufficient documentation

## 2023-02-03 DIAGNOSIS — G8929 Other chronic pain: Secondary | ICD-10-CM | POA: Diagnosis not present

## 2023-02-03 DIAGNOSIS — M47816 Spondylosis without myelopathy or radiculopathy, lumbar region: Secondary | ICD-10-CM | POA: Diagnosis present

## 2023-02-03 DIAGNOSIS — M5441 Lumbago with sciatica, right side: Secondary | ICD-10-CM | POA: Insufficient documentation

## 2023-02-03 DIAGNOSIS — M48061 Spinal stenosis, lumbar region without neurogenic claudication: Secondary | ICD-10-CM | POA: Diagnosis present

## 2023-02-03 DIAGNOSIS — R937 Abnormal findings on diagnostic imaging of other parts of musculoskeletal system: Secondary | ICD-10-CM | POA: Diagnosis present

## 2023-02-03 DIAGNOSIS — M79605 Pain in left leg: Secondary | ICD-10-CM | POA: Diagnosis present

## 2023-02-03 DIAGNOSIS — M5417 Radiculopathy, lumbosacral region: Secondary | ICD-10-CM | POA: Diagnosis present

## 2023-02-03 MED ORDER — TRIAMCINOLONE ACETONIDE 40 MG/ML IJ SUSP
40.0000 mg | Freq: Once | INTRAMUSCULAR | Status: AC
  Administered 2023-02-03: 40 mg

## 2023-02-03 MED ORDER — IOHEXOL 180 MG/ML  SOLN
INTRAMUSCULAR | Status: AC
  Filled 2023-02-03: qty 10

## 2023-02-03 MED ORDER — IOHEXOL 180 MG/ML  SOLN
10.0000 mL | Freq: Once | INTRAMUSCULAR | Status: AC
  Administered 2023-02-03: 10 mL via EPIDURAL

## 2023-02-03 MED ORDER — SODIUM CHLORIDE (PF) 0.9 % IJ SOLN
INTRAMUSCULAR | Status: AC
  Filled 2023-02-03: qty 10

## 2023-02-03 MED ORDER — FENTANYL CITRATE (PF) 100 MCG/2ML IJ SOLN
25.0000 ug | INTRAMUSCULAR | Status: DC | PRN
  Administered 2023-02-03: 50 ug via INTRAVENOUS

## 2023-02-03 MED ORDER — MIDAZOLAM HCL 5 MG/5ML IJ SOLN
INTRAMUSCULAR | Status: AC
  Filled 2023-02-03: qty 5

## 2023-02-03 MED ORDER — PENTAFLUOROPROP-TETRAFLUOROETH EX AERO
INHALATION_SPRAY | Freq: Once | CUTANEOUS | Status: AC
  Administered 2023-02-03: 30 via TOPICAL

## 2023-02-03 MED ORDER — TRIAMCINOLONE ACETONIDE 40 MG/ML IJ SUSP
INTRAMUSCULAR | Status: AC
  Filled 2023-02-03: qty 1

## 2023-02-03 MED ORDER — ROPIVACAINE HCL 2 MG/ML IJ SOLN
INTRAMUSCULAR | Status: AC
  Filled 2023-02-03: qty 20

## 2023-02-03 MED ORDER — ROPIVACAINE HCL 2 MG/ML IJ SOLN
2.0000 mL | Freq: Once | INTRAMUSCULAR | Status: AC
  Administered 2023-02-03: 2 mL via EPIDURAL

## 2023-02-03 MED ORDER — SODIUM CHLORIDE 0.9% FLUSH
2.0000 mL | Freq: Once | INTRAVENOUS | Status: AC
  Administered 2023-02-03: 2 mL

## 2023-02-03 MED ORDER — LIDOCAINE HCL 2 % IJ SOLN
20.0000 mL | Freq: Once | INTRAMUSCULAR | Status: AC
  Administered 2023-02-03: 100 mg

## 2023-02-03 MED ORDER — MIDAZOLAM HCL 5 MG/5ML IJ SOLN
0.5000 mg | Freq: Once | INTRAMUSCULAR | Status: AC
  Administered 2023-02-03: 3 mg via INTRAVENOUS

## 2023-02-03 MED ORDER — LIDOCAINE HCL (PF) 2 % IJ SOLN
INTRAMUSCULAR | Status: AC
  Filled 2023-02-03: qty 10

## 2023-02-03 MED ORDER — FENTANYL CITRATE (PF) 100 MCG/2ML IJ SOLN
INTRAMUSCULAR | Status: AC
  Filled 2023-02-03: qty 2

## 2023-02-03 NOTE — Patient Instructions (Signed)

## 2023-02-04 ENCOUNTER — Ambulatory Visit: Payer: Medicaid Other | Admitting: Physical Therapy

## 2023-02-04 ENCOUNTER — Telehealth: Payer: Self-pay

## 2023-02-04 DIAGNOSIS — G8929 Other chronic pain: Secondary | ICD-10-CM

## 2023-02-04 DIAGNOSIS — M5442 Lumbago with sciatica, left side: Secondary | ICD-10-CM | POA: Diagnosis not present

## 2023-02-04 DIAGNOSIS — M6281 Muscle weakness (generalized): Secondary | ICD-10-CM

## 2023-02-04 NOTE — Telephone Encounter (Signed)
 Post procedure follow up. Patient states she is still in pain andhas not gotten any relief.  Instructed her to document everything on her diary and to call us for any questions or concerns.

## 2023-02-04 NOTE — Therapy (Signed)
 OUTPATIENT PHYSICAL THERAPY THORACOLUMBAR TREATMENT   Patient Name: Cassidy Mathis MRN: [id number] DOB:04/11/1975, 48 y.o., female Today's Date: 02/04/2023  END OF SESSION:  PT End of Session - 02/04/23 1017     Visit Number 4    Number of Visits 12    Date for PT Re-Evaluation 02/23/23    Authorization Type Medicaid [employer]    Authorization Time Period 02/02/21 - 03/13/21    PT Start Time 1015    PT Stop Time 1055    PT Time Calculation (min) 40 min    Activity Tolerance No increased pain;Patient limited by pain    Behavior During Therapy [facility] for tasks assessed/performed              Past Medical History:  Diagnosis Date   Allergy    seasonal   Anemia    h/o   Asthma    Cancer (HCC)    cervical   Depression    GERD (gastroesophageal reflux disease)    RARE-NO MEDS   Headache    MIGRAINES   Hypertension    PT STATES SHE IS SUPPOSED TO BE TAKING LISINOPRIL -HCTZ BUT HAS BEEN OUT FOR A WHILE NEEDS TO GET ANOTHER PRESCRIPTION FROM HER PCP   Pancreatitis    Pilonidal cyst    TOOK ANTIBIOTIC THIS MONTH AND IT IS RESOLVED   Pneumonia 2018   Sciatica    Past Surgical History:  Procedure Laterality Date   ABDOMINAL HYSTERECTOMY     partial   CERVICAL CONIZATION W/BX N/A 03/23/2017   Procedure: CONIZATION CERVIX WITH BIOPSY;  Surgeon: Elby Webb Loges, MD;  Location: [facility] ORS;  Service: Gynecology;  Laterality: N/A;   CHOLECYSTECTOMY  10/21/2015   Procedure: LAPAROSCOPIC CHOLECYSTECTOMY WITH INTRAOPERATIVE CHOLANGIOGRAM;  Surgeon: Laneta JULIANNA Luna, MD;  Location: [facility];  Service: General;;   CHOLECYSTECTOMY Bilateral    COLONOSCOPY WITH PROPOFOL  N/A 12/06/2020   Procedure: COLONOSCOPY WITH PROPOFOL ;  Surgeon: Maryruth Ole DASEN, MD;  Location: [facility]OS[facility];  Service: Endoscopy;  Laterality: N/A;   CYST EXCISION     tongue AND WRIST   EPIGASTRIC HERNIA REPAIR N/A 04/18/2015   Procedure: HERNIA REPAIR EPIGASTRIC ADULT;  Surgeon: Laneta JULIANNA Luna, MD;   Location: [facility];  Service: General;  Laterality: N/A;   ERCP N/A 12/21/2016   Procedure: ENDOSCOPIC RETROGRADE CHOLANGIOPANCREATOGRAPHY (ERCP);  Surgeon: Jinny Carmine, MD;  Location: [facility] ENDOSCOPY;  Service: Endoscopy;  Laterality: N/A;   ESOPHAGOGASTRODUODENOSCOPY  12/06/2020   Procedure: ESOPHAGOGASTRODUODENOSCOPY (EGD);  Surgeon: Maryruth Ole DASEN, MD;  Location: [facility] ENDOSCOPY;  Service: Endoscopy;;   GIVENS CAPSULE STUDY N/A 12/08/2020   Procedure: GIVENS CAPSULE STUDY;  Surgeon: Unk Corinn Skiff, MD;  Location: [facility] ENDOSCOPY;  Service: Gastroenterology;  Laterality: N/A;   HERNIA REPAIR     LAPAROSCOPIC SALPINGO OOPHERECTOMY Right 01/01/2020   Procedure: LAPAROSCOPIC SALPINGO OOPHORECTOMY;  Surgeon: Schermerhorn, Debby PARAS, MD;  Location: [facility];  Service: Gynecology;  Laterality: Right;   LYSIS OF ADHESION N/A 01/01/2020   Procedure: LYSIS OF ADHESION;  Surgeon: Schermerhorn, Debby PARAS, MD;  Location: [facility];  Service: Gynecology;  Laterality: N/A;   RADICAL HYSTERECTOMY  04/14/2017   SALPINGECTOMY Bilateral 04/14/2017   SENTINEL LYMPH NODE BIOPSY     TUBAL LIGATION     VISCERAL ANGIOGRAPHY N/A 12/11/2020   Procedure: VISCERAL ANGIOGRAPHY;  Surgeon: Marea Selinda RAMAN, MD;  Location: [facility] INV[facility]IVE [facility];  Service: Cardiovascular;  Laterality: N/A;   Patient Active Problem List   Diagnosis Date Noted  Spondylosis without myelopathy or radiculopathy, lumbosacral region 09/06/2022   Lumbar facet joint pain 08/19/2022   Chronic low back pain (Bilateral) w/ sciatica (Bilateral) 12/29/2021   Lumbosacral radiculopathy at L5 (Bilateral) (L>R) 10/28/2021   Lumbosacral radiculopathy at S1 (Bilateral) (L>R) 10/28/2021   Weakness of lower extremity (Left) 10/28/2021   Chronic hip pain (Bilateral) (L>R) 10/28/2021   Greater trochanteric bursitis of hips (Bilateral) (R>L) 10/28/2021   DDD (degenerative disc disease), lumbosacral 10/28/2021   Chronic generalized pain 10/28/2021    Osteoarthritis of knee (Left) 10/28/2021   Elevated sed rate 04/27/2021   Vitamin D  deficiency 04/27/2021   Marijuana use 04/27/2021   Abnormal drug screen (12/29/2020) 04/27/2021   Chronic pain syndrome 12/29/2020   Pharmacologic therapy 12/29/2020   Disorder of skeletal system 12/29/2020   Problems influencing health status 12/29/2020   History of marijuana use 12/29/2020   Abnormal MRI, cervical spine (05/06/2019) 12/29/2020   Abnormal MRI, lumbar spine (12/06/2021) 12/29/2020   GERD (gastroesophageal reflux disease) 12/29/2020   NSAID induced gastritis 12/29/2020   Chronic low back pain (1ry area of Pain) (Bilateral) (L>R) w/o sciatica 12/29/2020   Chronic lower extremity pain (2ry area of Pain) (Bilateral) (L>R) 12/29/2020   Chronic sacroiliac joint pain (Bilateral) (R>L) 12/29/2020   Lumbosacral facet syndrome (Bilateral) 12/29/2020   Lumbar facet hypertrophy (L4-5) (Bilateral) 12/29/2020   Lumbosacral facet arthropathy 12/29/2020   Somatic dysfunction of sacroiliac joints (Bilateral) 12/29/2020   Hypokalemia 12/11/2020   Acute blood loss anemia 12/07/2020   Vitamin B12 deficiency 12/06/2020   Rectal bleeding 12/04/2020   Precordial pain 11/28/2020   Palpitations 11/28/2020   Heart murmur 11/28/2020   Uncontrolled hypertension 11/28/2020   Right ovarian cyst    Acute bilateral low back pain with bilateral sciatica 06/01/2019   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) 06/01/2019   Cervical cancer, FIGO stage IB1 (HCC) 05/17/2017   Ileus (HCC) 04/18/2017   Postoperative anemia due to acute blood loss 04/18/2017   Tobacco use 04/06/2017   Malignant neoplasm of overlapping sites of cervix (HCC) 04/06/2017   Malignant neoplasm of exocervix (HCC)    Common bile duct stone    Pancreatitis 12/16/2016   Calculus of gallbladder with chronic cholecystitis without obstruction    Incarcerated epigastric hernia     [facility]P: [facility]   REFERRING  PROVIDER:  Tanya Glisson, MD  REFERRING DIAG:  M54.50,G89.29 (ICD-10-CM) - Chronic bilateral low back pain without sciatica M79.604,M79.605,G89.29 (ICD-10-CM) - Chronic pain of lower extremity, bilateral  Rationale for Evaluation and Treatment: Rehabilitation  THERAPY DIAG:  Chronic bilateral low back pain with left-sided sciatica  Muscle weakness (generalized)  ONSET DATE: History of back pain since 2021  SUBJECTIVE:  SUBJECTIVE STATEMENT: Pt reports pain and discomfort significantly today but also after last session. Pt had another injection yesterday and reports continued pain in her back and no improvements.  Discussed with patient we could make today her last day but we will keep her case open in case she needs more physical therapy prior to her appointment with neurosurgery.  Patient okay with this plan and is disappointed that PT was not able to improve her pain levels.  PERTINENT HISTORY:  From 10/05/22: Procedure: Lumbar facet block (AKA.: Lumbosacral medial branch nerve block) Side: Bilateral Level: L3-4, L4-5, L5-S1, and TBD Facets (L2, L3, L4, L5, S1, and TBD Medial Branch Nerves)   Pt reports she has had sciatic problems for many years. Pt was diagnosed with DDD. Pt report she is physically unable to work and can barely walk at the end of the day. Pt is unable to rest due to the sharp pains going down her back and legs that wake her up regularly. Pt just started trying to go back to work yesterday but even just sitting in one place at a time she feels she has issues and has to frequently get up to move secondary to pain. Pt reports her facet block was grossly ineffective and once the numbing effect of the medication wore off her pain returned to it's debilitating state. Pt reports her  previous therapy experience made her pain worse. Pt reports the pain when she left was worse than when she came in.  Pt can tolerate standing 5 min before needing to sit or change positions.  Pt can sit for approximately 5-10 min prior to needing to get up  Pt report she has sciatic nerve pain in both Les going down the back of her legs.  Pt stand pain: sharp pain that shoots down to her feet, she has to lean over on skink to wash dishes Sitting pain: pain begins to go down her legs.   PAIN:  Are you having pain? Yes 10/10 (always 10 of 10); bilat SIJ areas, intermittent butt cheek shooting)   PRECAUTIONS: None  RED FLAGS: None   WEIGHT BEARING RESTRICTIONS: No  FALLS:  Has patient fallen in last 6 months? 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  DIAGNOSTIC FINDINGS:  From CT Lumbar apine 08/07/22: IMPRESSION: No recent fracture is seen in the lumbar spine. There is no significant central spinal stenosis.   There is encroachment of neural foramina at multiple levels in lumbar spine and lower thoracic spine as described in the body of the report.   Aortic arteriosclerosis  PATIENT SURVEYS:  FOTO 22 goal 37 STarT Back: 7 Tampa Scale 11: 39/40 (does not answer one question)   COGNITION: Overall cognitive status: Within functional limits for tasks assessed     SENSATION: Not tested  PALPATION: Pt points to bilateral low back along posteriorsuperior aspect of iliac spine   LUMBAR  ROM:   AROM eval  Flexion Hands to knees  Extension Min motion, only position of relief   Right  lateral flexion Hand to lateral thigh then tightness  Left lateral flexion Hand to lateral thigh then tightness  Right rotation Able to turn sideways then pain increases  Left rotation Able to turn sideways then pain increases   (Blank rows = not tested)  FUNCTIONAL TESTS:  5 x STS 4 reps in 32 sec, caused significant increase in pain on rep 3 ( attempted without UE pain)    GAIT: Distance walked: 60 ft  Assistive device utilized: None Level of assistance: Complete Independence Comments: arch collapse, slight hip drop, NBOS generally   TODAY'S TREATMENT: DATE: 02/04/23  Graded movement exposure alternating from sitting to standing exercise as follows x 3 rounds  - Nustep UE/ LE with focus on UE dominance to diminish discomfort x 3 min  - brief seated rest  - Ambulation x 320 ft  - brief seated rest  - Nustep UE/ LE with focus on UE dominance to diminish discomfort x 3 min  - brief seated rest  - Ambulation x 320 ft  - brief seated rest  - Nustep UE/ LE with focus on UE dominance to diminish discomfort x 3 min  - brief seated rest  - Ambulation x 320 ft  - brief seated rest  Tried a wedge in patient's left shoe in order to improve her arch collapse with ambulation to see if this modified her pain.  Patient reports it felt good patient was instructed to look into medial support and arch support for shoes in order to potentially improve her pain levels.  Patient also provided with the following education for pain as well as for graded exposure to activities to improve her strength. [email or web address]  [email or web address]  Patient verbalized understanding of looking into the above links with the handout she was provided was also educated regarding  what these papers were talking about.  Paper has a QR code but is interactive for patient to follow-up with on her own. PATIENT EDUCATION:  Education details: POC Person educated: Patient Education method: Explanation Education comprehension: verbalized understanding   HOME EXERCISE PROGRAM: None assigned yet; pt needs with with symptoms management at home first.    ASSESSMENT:  CLINICAL IMPRESSION:  Patient presents with continues significant levels of pain and reports significant increase in pain following physical therapy sessions.  Physical therapist agrees to take a short break from physical therapy allow the patient to potentially go to her neurosurgical appointment.  Physical therapist is leaving case open in case patient needs to come back prior to neurosurgery appointment.  Patient provided with education materials for chronic pain as well as graded activity exposure and was provided with education regarding this.  Patient seemed to respond fairly well to activities today but as stated earlier her response to therapy following sessions has been overall negative and causing her significant pain.  OBJECTIVE IMPAIRMENTS: Abnormal gait, decreased activity tolerance, decreased mobility, decreased ROM, decreased strength, impaired perceived functional ability, increased muscle spasms, impaired flexibility, postural dysfunction, and pain.   ACTIVITY LIMITATIONS: carrying, lifting, bending, sitting, standing, squatting, and locomotion level  PARTICIPATION LIMITATIONS: meal prep, laundry, interpersonal relationship, shopping, community activity, and yard work  PERSONAL FACTORS: Time since onset of injury/illness/exacerbation and HTN, Depression, hx of cancer  are also affecting patient's functional outcome.   REHAB POTENTIAL: Fair due to chronicity and lack of response to other treatment options  CLINICAL DECISION MAKING: Stable/uncomplicated  EVALUATION COMPLEXITY:  Low   GOALS: Goals reviewed with patient? Yes  SHORT TERM GOALS: Target date: 02/01/2023  Patient will be independent in home exercise program to improve strength/mobility for better functional independence with ADLs. Baseline: No HEP currently  Goal status: INITIAL   LONG TERM GOALS: Target date: 03/01/2023  1.  Patient will complete 30 second chair stand test by 4 reps or more indicating an increased LE strength and improved balance. Baseline: 4 Goal status: INITIAL  2.  Patient will increase FOTO score to equal to or greater than  40   to demonstrate statistically significant improvement in mobility and quality of life.  Baseline: 27 Goal status: INITIAL   3.  Patient will decrease Tampa scale 11 score by 10 points to demonstrate improved confidence for completion of functional activities. Baseline: 39/40 Goal status: INITIAL  4.  Patient will decrease STarT back score by > 3 points to demonstrate improved back related impairments.  Baseline: 7 Goal status: INITIAL  5.  Patient will increase six minute walk test distance to >1000 for progression to community ambulator and improve gait ability Baseline: test visit 2, don't exacerbate pain Goal status: INITIAL   PLAN:  PT FREQUENCY: 2x/week  PT DURATION: 8 weeks  PLANNED INTERVENTIONS: 97110-Therapeutic exercises, 97530- Therapeutic activity, 97112- Neuromuscular re-education, 97535- Self Care, [id number]- Manual therapy, [id number]- Gait training, [id number]- Electrical stimulation (manual), [id number]- Ultrasound, [id number]- Traction (mechanical), Dry Needling, Joint mobilization, Joint manipulation, Spinal manipulation, Spinal mobilization, Cryotherapy, and Moist heat.  PLAN FOR NEXT SESSION:  -Continue with graded ROM/exercise as tolerated today   10:26 AM, 02/04/23

## 2023-02-06 ENCOUNTER — Emergency Department: Payer: BLUE CROSS/BLUE SHIELD

## 2023-02-06 ENCOUNTER — Emergency Department
Admission: EM | Admit: 2023-02-06 | Discharge: 2023-02-06 | Disposition: A | Discharge status: Home / Self Care | Payer: BLUE CROSS/BLUE SHIELD | Attending: Emergency Medicine | Admitting: Emergency Medicine

## 2023-02-06 ENCOUNTER — Other Ambulatory Visit: Payer: Self-pay

## 2023-02-06 DIAGNOSIS — W000XXA Fall on same level due to ice and snow, initial encounter: Secondary | ICD-10-CM | POA: Diagnosis not present

## 2023-02-06 DIAGNOSIS — S8392XA Sprain of unspecified site of left knee, initial encounter: Secondary | ICD-10-CM | POA: Insufficient documentation

## 2023-02-06 DIAGNOSIS — S8992XA Unspecified injury of left lower leg, initial encounter: Secondary | ICD-10-CM | POA: Diagnosis present

## 2023-02-06 MED ORDER — NAPROXEN 500 MG PO TABS
500.0000 mg | ORAL_TABLET | Freq: Once | ORAL | Status: AC
  Administered 2023-02-06: 500 mg via ORAL
  Filled 2023-02-06: qty 1

## 2023-02-06 MED ORDER — NAPROXEN 500 MG PO TABS: 500.0000 mg | ORAL_TABLET | Freq: Two times a day (BID) | ORAL | 2 refills | Status: DC

## 2023-02-06 NOTE — ED Provider Triage Note (Signed)
 Emergency Medicine Provider Triage Evaluation Note  Cassidy Mathis , a 48 y.o. female  was evaluated in triage.  Pt complains of left knee pain after slipping on ice. She landed directly on the knee.  Physical Exam  Temp 97.9 F (36.6 C) (Oral)   LMP 03/18/2017 (Exact Date)  Gen:   Awake, no distress   Resp:  Normal effort  MSK:   Moves extremities without difficulty  Other:    Medical Decision Making  Medically screening exam initiated at 12:02 PM.  Appropriate orders placed.  Cassidy Mathis was informed that the remainder of the evaluation will be completed by another provider, this initial triage assessment does not replace that evaluation, and the importance of remaining in the ED until their evaluation is complete.     Cassidy Kirk NOVAK, FNP 02/06/23 1203

## 2023-02-06 NOTE — ED Triage Notes (Signed)
 Pt slipped on ice and fell onto her L knee this morning. Pt says she can barely walk due to pain. Pt reports pain radiates up her thigh, denies hip pain. Pt is not on blood thinners. Pt denies previous surgery to affected joint.

## 2023-02-07 ENCOUNTER — Ambulatory Visit (INDEPENDENT_AMBULATORY_CARE_PROVIDER_SITE_OTHER): Payer: BLUE CROSS/BLUE SHIELD | Admitting: Neurosurgery

## 2023-02-07 VITALS — BP 130/82 | Ht 65.0 in | Wt 215.0 lb

## 2023-02-07 DIAGNOSIS — M5441 Lumbago with sciatica, right side: Secondary | ICD-10-CM

## 2023-02-07 DIAGNOSIS — G8929 Other chronic pain: Secondary | ICD-10-CM | POA: Diagnosis not present

## 2023-02-07 DIAGNOSIS — M5442 Lumbago with sciatica, left side: Secondary | ICD-10-CM

## 2023-02-07 NOTE — Progress Notes (Signed)
 Referring Physician:  No referring provider defined for this encounter.  Primary Physician:  [facility]  History of Present Illness: 02/07/2023 Ms. Odom's is following up today with her history of back pain and bilateral lower extremity pain.  She has been working with physical therapy.  Unfortunately not has not yet scheduled with her cervical or thoracic imaging, or her EMG.  She is coming back in today for reevaluation.  She cannot find anything that improves her lower extremity pain.  She has now started her physical therapy.  She does not feel like her symptoms are getting worse, but she states that she has not noticed any improvement.  She has had previous lumbar injections with no improvement.  Conservative measures:  Physical therapy: participated in 4 visits from 01/15/21 to 02/24/21 Multimodal medical therapy including regular antiinflammatories:  Methocarbamol , Hydrocodone , Gabapentin , Naproxen , Lidocaine  patches, Meloxicam , Prednisone  Injections: 12/15/21: Left L4-5 IL ESI (Dr. Tanya), no improvement 11/10/21: Left L4-5 IL ESI (Dr. Tanya), no improvement  The symptoms are causing a significant impact on the patient's life.   I have utilized the care everywhere function in epic to review the outside records available from external health systems.  Review of Systems:  A 10 point review of systems is negative, except for the pertinent positives and negatives detailed in the HPI.  Past Medical History: Past Medical History:  Diagnosis Date   Allergy    seasonal   Anemia    h/o   Asthma    Cancer (HCC)    cervical   Depression    GERD (gastroesophageal reflux disease)    RARE-NO MEDS   Headache    MIGRAINES   Hypertension    PT STATES SHE IS SUPPOSED TO BE TAKING LISINOPRIL -HCTZ BUT HAS BEEN OUT FOR A WHILE NEEDS TO GET ANOTHER PRESCRIPTION FROM HER PCP   Pancreatitis    Pilonidal cyst    TOOK ANTIBIOTIC THIS MONTH AND IT IS  RESOLVED   Pneumonia 2018   Sciatica     Past Surgical History: Past Surgical History:  Procedure Laterality Date   ABDOMINAL HYSTERECTOMY     partial   CERVICAL CONIZATION W/BX N/A 03/23/2017   Procedure: CONIZATION CERVIX WITH BIOPSY;  Surgeon: Elby Webb Loges, MD;  Location: [facility] ORS;  Service: Gynecology;  Laterality: N/A;   CHOLECYSTECTOMY  10/21/2015   Procedure: LAPAROSCOPIC CHOLECYSTECTOMY WITH INTRAOPERATIVE CHOLANGIOGRAM;  Surgeon: Laneta JULIANNA Luna, MD;  Location: [facility];  Service: General;;   CHOLECYSTECTOMY Bilateral    COLONOSCOPY WITH PROPOFOL  N/A 12/06/2020   Procedure: COLONOSCOPY WITH PROPOFOL ;  Surgeon: Maryruth Ole DASEN, MD;  Location: [facility] ENDOS[facility];  Service: Endoscopy;  Laterality: N/A;   CYST EXCISION     tongue AND WRIST   EPIGASTRIC HERNIA REPAIR N/A 04/18/2015   Procedure: HERNIA REPAIR EPIGASTRIC ADULT;  Surgeon: Laneta JULIANNA Luna, MD;  Location: [facility];  Service: General;  Laterality: N/A;   ERCP N/A 12/21/2016   Procedure: ENDOSCOPIC RETROGRADE CHOLANGIOPANCREATOGRAPHY (ERCP);  Surgeon: Jinny Carmine, MD;  Location: [facility] ENDOSCOPY;  Service: Endoscopy;  Laterality: N/A;   ESOPHAGOGASTRODUODENOSCOPY  12/06/2020   Procedure: ESOPHAGOGASTRODUODENOSCOPY (EGD);  Surgeon: Maryruth Ole DASEN, MD;  Location: [facility] ENDOSCOPY;  Service: Endoscopy;;   GIVENS CAPSULE STUDY N/A 12/08/2020   Procedure: GIVENS CAPSULE STUDY;  Surgeon: Unk Corinn Skiff, MD;  Location[facility];  Service: Gastroenterology;  Laterality: N/A;   HERNIA REPAIR     LAPAROSCOPIC SALPINGO OOPHERECTOMY Right 01/01/2020   Procedure: LAPAROSCOPIC SALPINGO OOPHORECTOMY;  Surgeon: Schermerhorn, Debby PARAS, MD;  Location: [facility] OR[facility];  Service: Gynecology;  Laterality: Right;   LYSIS OF ADHESION N/A 01/01/2020   Procedure: LYSIS OF ADHESION;  Surgeon: Schermerhorn, Debby PARAS, MD;  Location: [facility] OR[facility];  Service: Gynecology;  Laterality: N/A;   RADICAL HYSTERECTOMY  04/14/2017   SALPINGECTOMY  Bilateral 04/14/2017   SENTINEL LYMPH NODE BIOPSY     TUBAL LIGATION     VISCERAL ANGIOGRAPHY N/A 12/11/2020   Procedure: VISCERAL ANGIOGRAPHY;  Surgeon: Marea Selinda RAMAN, MD;  Location: [facility] INV[facility]IVE [facility];  Service: Cardiovascular;  Laterality: N/A;    Allergies: Allergies as of 02/07/2023 - Review Complete 02/07/2023  Allergen Reaction Noted   Ace inhibitors Swelling 03/30/2021    Medications:  Current Outpatient Medications:    albuterol  (PROVENTIL  HFA;VENTOLIN  HFA) 108 (90 Base) MCG/ACT inhaler, Inhale 2 puffs into the lungs every 6 (six) hours as needed for wheezing or shortness of breath., Disp: 1 Inhaler, Rfl: 2   amLODipine  (NORVASC ) 5 MG tablet, TAKE 1 TABLET(5 MG) BY MOUTH DAILY (Patient taking differently: Take 10 mg by mouth daily.), Disp: 30 tablet, Rfl: 0   gabapentin  (NEURONTIN ) 300 MG capsule, Take 300 mg by mouth 3 (three) times daily., Disp: , Rfl:    losartan (COZAAR) 25 MG tablet, Take 25 mg by mouth daily., Disp: , Rfl:    naproxen  (NAPROSYN ) 500 MG tablet, Take 1 tablet (500 mg total) by mouth 2 (two) times daily with a meal., Disp: 20 tablet, Rfl: 2  Social History: Social History   Tobacco Use   Smoking status: Every Day    Current packs/day: 0.00    Average packs/day: 0.5 packs/day for 12.0 years (6.0 ttl pk-yrs)    Types: Cigarettes    Last attempt to quit: 2023    Years since quitting: 2.0   Smokeless tobacco: Never   Tobacco comments:    07/2021 stop smoking cigarettes, but smoke two black milds a day   Vaping Use   Vaping status: Never Used  Substance Use Topics   Alcohol use: Yes    Comment: occassionally; less than 1/month   Drug use: Yes    Frequency: 1.0 times per week    Types: Marijuana    Family Medical History: Family History  Problem Relation Age of Onset   Hypertension Mother    Gallstones Mother    Hypertension Father    Gout Father    Cancer Paternal Uncle        lung   Cancer Maternal Grandmother        not sure what type  of cancer   Cancer Maternal Grandfather        not sure what type of cancer   Cancer Paternal Grandmother        not sure what type of cancer   Cancer Paternal Grandfather        not sure what type of cancer    Physical Examination: Vitals:   02/07/23 1135  BP: 130/82    General: Patient is in no apparent distress. Attention to examination is appropriate.  Neck:   Supple.  Full range of motion.  Respiratory: Patient is breathing without any difficulty.   NEUROLOGICAL:     Awake, alert, oriented to person, place, and time.  Speech is clear and fluent.   Cranial Nerves: Pupils equal round and reactive to light.  Facial tone is symmetric.  Facial sensation is symmetric. Shoulder shrug is symmetric. Tongue protrusion is midline.    Strength:  Side  Iliopsoas Quads Hamstring PF DF EHL  R 5 5 5 5 5 5   L 5 5 5 5 5 5    Reflexes are 2+ and symmetric at the biceps, triceps, brachioradialis, patella and achilles.   Hoffman's is absent. Clonus is absent.  Babinski responses mute.   Bilateral upper and lower extremity sensation is intact to light touch at rest     Gait is antalgic.    Imaging: Narrative & Impression  CLINICAL DATA:  Low back pain. Symptoms persist with greater than 6 weeks of treatment. Pain radiates into the lower extremities bilaterally, left greater than right.   EXAM: MRI LUMBAR SPINE WITHOUT CONTRAST   TECHNIQUE: Multiplanar, multisequence MR imaging of the lumbar spine was performed. No intravenous contrast was administered.   COMPARISON:  05/05/2019   FINDINGS: Segmentation: 5 non rib-bearing lumbar type vertebral bodies are present. The lowest fully formed vertebral body is L5.   Alignment: No significant listhesis is present. Mild straightening of the normal lumbar lordosis is again seen.   Vertebrae: Diffuse decreased marrow signal is slightly improved compared to the prior study. No discrete lesions are present. Vertebral body heights are  maintained.   Conus medullaris and cauda equina: Conus extends to the L2 level. Conus and cauda equina appear normal.   Paraspinal and other soft tissues: Limited imaging the abdomen is unremarkable. There is no significant adenopathy. No solid organ lesions are present.   Disc levels:   L1-2: Normal disc signal and height is present. No focal protrusion or stenosis is present.   L2-3: Normal disc signal and height is present. No focal protrusion or stenosis is present.   L3-4: A broad-based disc protrusion is again noted. Mild facet hypertrophy demonstrates slight progression. Mild right foraminal narrowing is present. The left foramen is patent.   L4-5: A broad-based disc protrusion is present. A far left annular tear is present adjacent to the exiting left L4 nerve roots. Mild foraminal narrowing is worse on the left. Mild right foraminal narrowing is present.   L5-S1: Mild facet hypertrophy is noted bilaterally. No significant disc protrusion or stenosis is present.   IMPRESSION: 1. Slight progression of mild right foraminal narrowing at L3-4 secondary to a broad-based disc protrusion and mild facet hypertrophy. 2. Far left annular tear at L4-5 adjacent to the exiting left L4 nerve roots. 3. Mild foraminal narrowing bilaterally at L4-5 is worse on the left. 4. Mild facet hypertrophy at L5-S1 without significant disc protrusion or stenosis. 5. Diffuse decreased marrow signal is slightly improved compared to the prior study. This is nonspecific, but can be seen in the setting of anemia, smoking, or obesity.     Electronically Signed   By: Lonni Necessary M.D.   On: 12/06/2021 07:02   Narrative & Impression  CLINICAL DATA:  Low back pain, bilateral sciatica extending to both feet.   EXAM: CT LUMBAR SPINE WITHOUT CONTRAST   TECHNIQUE: Multidetector CT imaging of the lumbar spine was performed without intravenous contrast administration. Multiplanar CT  image reconstructions were also generated.   RADIATION DOSE REDUCTION: This exam was performed according to the departmental dose-optimization program which includes automated exposure control, adjustment of the mA and/or kV according to patient size and/or use of iterative reconstruction technique.   COMPARISON:  Previous studies including MRI lumbar spine done on 12/02/2021   FINDINGS: Segmentation: There are 5 non-rib-bearing vertebrae in lumbar region.   Alignment: Alignment of posterior margins of vertebral bodies appears normal.   Vertebrae: No recent  fracture is seen.   Paraspinal and other soft tissues: There is no central spinal stenosis. There are few scattered small arterial calcifications surgical clips are seen in gallbladder fossa.   Disc levels: Bulging of annulus is seen at T11-T12 level causing mild extrinsic pressure over the ventral margin of thecal sac without significant encroachment of neural foramina.   No significant abnormalities are seen at T12-L1 level.   No significant abnormalities are seen at the L1-L2 level.   Bulging of annulus is causing mild narrowing of left neural foramina at L2-L3 level.   Bulging annulus is causing extrinsic pressure over the ventral margin of thecal sac and mild encroachment of neural foramina at L3-L4 level.   Bulging of annulus is causing extrinsic pressure over the ventral margin of thecal sac and mild to moderate encroachment of neural foramina at the L4-L5 level.   Bulging annulus is causing mild encroachment of neural foramina at L5-S1 level.   IMPRESSION: No recent fracture is seen in the lumbar spine. There is no significant central spinal stenosis.   There is encroachment of neural foramina at multiple levels in lumbar spine and lower thoracic spine as described in the body of the report.   Aortic arteriosclerosis.     Electronically Signed   By: Gearldine Mary M.D.   On: 08/07/2022 18:57    I have personally reviewed the images and agree with the above interpretation.  Medical Decision Making/Assessment and Plan: Ms. Totten is a pleasant 48 y.o. female with history of chronic back pain who has had previous evaluations including total spine imaging.  She has started her physical therapy.  At her last visit we recommended that she have cervical and thoracic spinal imaging as well as an EMG to help identify any possible pain generator or radiculopathy.  Her imaging currently is not on revealing with no evidence of lumbar compression noted that could be causing her bilateral lower extremity pain.  Again this could be coming from the thoracic spine or cervical spine however there are very few if any signs that would point to that on her clinical examination.  As her pain is including a subjective numbness tingling and radiation we like her to get an EMG to evaluate for any evidence of radiculopathy or neuropathy.  Would like to follow-up with her after this.    Thank you for involving me in the care of this patient.    Penne MICAEL Sharps MD/MSCR Neurosurgery

## 2023-02-08 ENCOUNTER — Encounter: Payer: BLUE CROSS/BLUE SHIELD | Admitting: Physical Therapy

## 2023-02-08 ENCOUNTER — Ambulatory Visit: Payer: Medicaid Other | Admitting: Physical Therapy

## 2023-02-10 ENCOUNTER — Ambulatory Visit: Payer: BLUE CROSS/BLUE SHIELD | Admitting: Physical Therapy

## 2023-02-10 ENCOUNTER — Encounter: Payer: Medicaid Other | Admitting: Physical Therapy

## 2023-02-14 ENCOUNTER — Encounter: Payer: Medicaid Other | Admitting: Physical Therapy

## 2023-02-16 ENCOUNTER — Encounter: Payer: Medicaid Other | Admitting: Physical Therapy

## 2023-02-16 NOTE — ED Provider Notes (Signed)
   [facility] Provider Note    Event Date/Time   First MD Initiated Contact with Patient 02/06/23 1409     (approximate)   History   Knee Pain   HPI  Cassidy Mathis is a 48 y.o. female who presents with complaints of left knee pain after slipping on the ice, she fell forward onto her knee, no other injuries reported.  She is able to ambulate     Physical Exam   Triage Vital Signs: ED Triage Vitals  Encounter Vitals Group     BP 02/06/23 1202 (!) 142/100     Systolic BP Percentile --      Diastolic BP Percentile --      Pulse Rate 02/06/23 1202 (!) 102     Resp 02/06/23 1202 18     Temp 02/06/23 1200 97.9 F (36.6 C)     Temp Source 02/06/23 1200 Oral     SpO2 02/06/23 1202 98 %     Weight 02/06/23 1202 97.5 kg (215 lb)     Height 02/06/23 1202 1.651 m (5\' 5" )     Head Circumference --      Peak Flow --      Pain Score 02/06/23 1202 10     Pain Loc --      Pain Education --      Exclude from Growth Chart --     Most recent vital signs: Vitals:   02/06/23 1200 02/06/23 1202  BP:  (!) 142/100  Pulse:  (!) 102  Resp:  18  Temp: 97.9 F (36.6 C)   SpO2:  98%     General: Awake, no distress.  CV:  Good peripheral perfusion.  Resp:  Normal effort.  Abd:  No distention.  Other:  Left knee: Mild tenderness along the lateral aspect of the knee, no significant effusion, no bony normalities, patient is able to ambulate   ED Results / Procedures / Treatments   Labs (all labs ordered are listed, but only abnormal results are displayed) Labs Reviewed - No data to display   EKG     RADIOLOGY Left knee x-ray viewed interpret by me, no fracture    PROCEDURES:  Critical Care performed:   Procedures   MEDICATIONS ORDERED IN ED: Medications  naproxen (NAPROSYN) tablet 500 mg (500 mg Oral Given 02/06/23 1515)     IMPRESSION / MDM / ASSESSMENT AND PLAN / ED COURSE  I reviewed the triage vital signs and the nursing  notes. Patient's presentation is most consistent with acute complicated illness / injury requiring diagnostic workup.  Patient presents after mechanical fall with left knee injury, x-ray negative for fracture, suspect mild sprain, recommend supportive care, outpatient follow-up as needed.        FINAL CLINICAL IMPRESSION(S) / ED DIAGNOSES   Final diagnoses:  Sprain of left knee, unspecified ligament, initial encounter     Rx / DC Orders   ED Discharge Orders          Ordered    naproxen (NAPROSYN) 500 MG tablet  2 times daily with meals        02/06/23 1427             Note:  This document was prepared using Dragon voice recognition software and may include unintentional dictation errors.   Jene Every, MD 02/16/23 [phone]

## 2023-02-17 ENCOUNTER — Ambulatory Visit: Payer: BLUE CROSS/BLUE SHIELD | Admitting: Physical Therapy

## 2023-02-17 ENCOUNTER — Encounter: Payer: Self-pay | Admitting: Pain Medicine

## 2023-02-17 ENCOUNTER — Ambulatory Visit: Payer: BLUE CROSS/BLUE SHIELD | Attending: Pain Medicine | Admitting: Pain Medicine

## 2023-02-17 DIAGNOSIS — M79605 Pain in left leg: Secondary | ICD-10-CM

## 2023-02-17 DIAGNOSIS — M5442 Lumbago with sciatica, left side: Secondary | ICD-10-CM

## 2023-02-17 DIAGNOSIS — G8929 Other chronic pain: Secondary | ICD-10-CM

## 2023-02-17 DIAGNOSIS — M5417 Radiculopathy, lumbosacral region: Secondary | ICD-10-CM

## 2023-02-17 DIAGNOSIS — M5459 Other low back pain: Secondary | ICD-10-CM

## 2023-02-17 DIAGNOSIS — M79604 Pain in right leg: Secondary | ICD-10-CM

## 2023-02-17 DIAGNOSIS — Z09 Encounter for follow-up examination after completed treatment for conditions other than malignant neoplasm: Secondary | ICD-10-CM

## 2023-02-17 DIAGNOSIS — M5441 Lumbago with sciatica, right side: Secondary | ICD-10-CM

## 2023-02-17 DIAGNOSIS — R29898 Other symptoms and signs involving the musculoskeletal system: Secondary | ICD-10-CM

## 2023-02-17 NOTE — Patient Instructions (Signed)

## 2023-02-17 NOTE — Progress Notes (Signed)
Patient: Cassidy Mathis  Service Category: E/M  Provider: Oswaldo Done, MD  DOB: 1975/04/13  DOS: 02/17/2023  Location: Office  MRN: [id number]  Setting: Ambulatory outpatient  Referring Provider: [facility]  Type: Established Patient  Specialty: Interventional Pain Management  PCP: [facility]  Location: Remote location  Delivery: TeleHealth     Virtual Encounter - Pain Management PROVIDER NOTE: Information contained herein reflects review and annotations entered in association with encounter. Interpretation of such information and data should be left to medically-trained personnel. Information provided to patient can be located elsewhere in the medical record under "Patient Instructions". Document created using STT-dictation technology, any transcriptional errors that may result from process are unintentional.    Contact & Pharmacy Preferred: [phone] Home: [phone] (home) Mobile: [phone] (mobile) E-mail: [email or web address] [place], [place] - [address] AT [facility] [address] [place] [address] Phone: [phone] Fax: [phone]   Pre-screening  Cassidy Mathis offered "in-person" vs "virtual" encounter. She indicated preferring virtual for this encounter.   Reason COVID-19*  Social distancing based on CDC and AMA recommendations.   I contacted Nykayla L Hermiz on 02/17/2023 via telephone.      I clearly identified myself as Oswaldo Done, MD. I verified that I was speaking with the correct person using two identifiers (Name: Cassidy Mathis, and date of birth: [birth year]).  Consent I sought verbal advanced consent from Cassidy Mathis for virtual visit interactions. I informed [name] of possible security and privacy concerns, risks, and limitations associated with providing "not-in-person" medical evaluation and management services. I also  informed Ms. Bacchi of the availability of "in-person" appointments. Finally, I informed her that there would be a charge for the virtual visit and that she could be  personally, fully or partially, financially responsible for it. [name] expressed understanding and agreed to proceed.   Historic Elements   Ms. Cassidy Mathis is a 48 y.o. year old, female patient evaluated today after our last contact on 02/03/2023. Cassidy Mathis  has a past medical history of Allergy, Anemia, Asthma, Cancer (HCC), Depression, GERD (gastroesophageal reflux disease), Headache, Hypertension, Pancreatitis, Pilonidal cyst, Pneumonia (2018), and Sciatica. She also  has a past surgical history that includes Tubal ligation; epigastric hernia repair (N/A, 04/18/2015); Hernia repair; Cholecystectomy (10/21/2015); ERCP (N/A, 12/21/2016); Cyst excision; Cholecystectomy (Bilateral); Cervical conization w/bx (N/A, 03/23/2017); Radical hysterectomy (04/14/2017); Salpingectomy (Bilateral, 04/14/2017); Sentinel lymph node biopsy; Abdominal hysterectomy; Lysis of adhesion (N/A, 01/01/2020); Laparoscopic salpingo oophorectomy (Right, 01/01/2020); Colonoscopy with propofol (N/A, 12/06/2020); Esophagogastroduodenoscopy (12/06/2020); Givens capsule study (N/A, 12/08/2020); and VISCERAL ANGIOGRAPHY (N/A, 12/11/2020). Cassidy Mathis has a current medication list which includes the following prescription(s): albuterol, amlodipine, gabapentin, losartan, and naproxen. She  reports that she has been smoking cigarettes. She has a 6 pack-year smoking history. She has never used smokeless tobacco. She reports current alcohol use. She reports current drug use. Frequency: 1.00 time per week. Drug: Marijuana. Cassidy Mathis is allergic to ace inhibitors.  BMI: Estimated body mass index is 35.78 kg/m as calculated from the following:   Height as of 02/07/23: 5\' 5"  (1.651 m).   Weight as of 02/07/23: 215 lb (97.5 kg). Last encounter: 01/17/2023. Last procedure: 02/03/2023.  HPI   Today, she is being contacted for a post-procedure assessment.  The patient indicates having changed her face-to-face appointment for a virtual visit due to having developed an open respiratory infection  and having had a fall when she slipped on ice.  She ended up hitting her left side and twisting her left knee.  She went to the emergency room where they did some x-rays and she has an appointment to follow-up with the orthopedic surgeons at the [facility] next week.  She is currently having more pain on the right lower extremity going all the way down to the top of her right foot and what appears to be an L5 dermatomal distribution.  For this reason, I will be scheduling her to return for a right-sided L5-S1 LESI #1 under fluoroscopic guidance.  Will schedule this in about 2 weeks from now to allow her to recover from her open respiratory infection.  The plan was shared with the patient who understood and accepted.  Post-procedure evaluation   Type: Lumbar epidural steroid injection (LESI) (interlaminar) #1    Laterality: Left   Level:  L3-4 Level.  Imaging: Fluoroscopic guidance Spinal ([id number]) Anesthesia: Local anesthesia (1-2% Lidocaine) Anxiolysis: IV Versed 3.0 mg Sedation: Moderate Sedation Fentanyl 1 mL (50 mcg) DOS: 02/03/2023  Performed by: Oswaldo Done, MD  Purpose: Diagnostic/Therapeutic Indications: Lumbar radicular pain of intraspinal etiology of more than 4 weeks that has failed to respond to conservative therapy and is severe enough to impact quality of life or function. 1. 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 Chronic lower extremity pain (2ry area of Pain) (Bilateral) (L>R)   3. Degeneration of intervertebral disc of lumbosacral region with discogenic back pain and lower extremity pain   4. 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 Lumbar facet hypertrophy (L4-5) (Bilateral)   6. Lumbosacral radiculopathy at L5 (Bilateral)  (L>R)   7. Lumbosacral radiculopathy at S1 (Bilateral) (L>R)   8. Weakness of lower extremity (Left)   9. Abnormal MRI, lumbar spine (12/06/2021)    NAS-11 Pain score:   Pre-procedure: 10-Worst pain ever/10   Post-procedure: 0-No pain/10     Effectiveness:  Initial hour after procedure: 100 %. Subsequent 4-6 hours post-procedure: 90 %. Analgesia past initial 6 hours: 0 %. Ongoing improvement:  Analgesic: Unfortunately the patient had a recent fall where she twisted her left knee and fell onto her left side this has aggravated her symptoms and she is currently walking around with crutches.  This is likely to have affected the possible benefits that we would have seen with the left L3-4 LESI. Function: Back to baseline ROM: Back to baseline  Pharmacotherapy Assessment  Opioid Analgesic: No chronic opioid analgesics therapy prescribed by our practice. Hydrocodone/APAP 5/325, 1 tab p.o. 4 times daily (#10) (last filled 08/08/2022) MME/day: 20 mg/day   Monitoring: [place] PMP: PDMP reviewed during this encounter.       Pharmacotherapy: No side-effects or adverse reactions reported. Compliance: No problems identified. Effectiveness: Clinically acceptable. Plan: Refer to "POC". UDS:  Summary  Date Value Ref Range Status  12/29/2020 Note  Final    Comment:    ==================================================================== Compliance Drug Analysis, Ur ==================================================================== Test                             Result       Flag       Units  Drug Present and Declared for Prescription Verification   Butalbital                     PRESENT      EXPECTED  Gabapentin                     PRESENT      EXPECTED  Drug Present not Declared for Prescription Verification   Carboxy-THC                    96           UNEXPECTED ng/mg creat    Carboxy-THC is a metabolite of tetrahydrocannabinol (THC). Source of    THC is most commonly herbal marijuana or  marijuana-based products,    but THC is also present in a scheduled prescription medication.    Trace amounts of THC can be present in hemp and cannabidiol (CBD)    products. This test is not intended to distinguish between delta-9-    tetrahydrocannabinol, the predominant form of THC in most herbal or    marijuana-based products, and delta-8-tetrahydrocannabinol.  Drug Absent but Declared for Prescription Verification   Acetaminophen                  Not Detected UNEXPECTED    Acetaminophen, as indicated in the declared medication list, is not    always detected even when used as directed.  ==================================================================== Test                      Result    Flag   Units      Ref Range   Creatinine              54               mg/dL      >=16 ==================================================================== Declared Medications:  The flagging and interpretation on this report are based on the  following declared medications.  Unexpected results may arise from  inaccuracies in the declared medications.   **Note: The testing scope of this panel includes these medications:   Butalbital (Fioricet)  Gabapentin (Neurontin)   **Note: The testing scope of this panel does not include small to  moderate amounts of these reported medications:   Acetaminophen (Fioricet)   **Note: The testing scope of this panel does not include the  following reported medications:   Albuterol (Ventolin HFA)  Amlodipine (Norvasc)  Caffeine (Fioricet)  Cyanocobalamin  Esomeprazole (Nexium)  Lisinopril (Zestril) ==================================================================== For clinical consultation, please call [phone]. ====================================================================    No results found for: "CBDTHCR", "D8THCCBX", "D9THCCBX"   Laboratory Chemistry Profile   Renal Lab Results  Component Value Date   BUN 7 12/12/2020    CREATININE 0.84 12/12/2020   BCR 8 (L) 11/28/2020   GFRAA >60 05/05/2019   GFRNONAA >60 12/12/2020    Hepatic Lab Results  Component Value Date   AST 18 12/04/2020   ALT 19 12/04/2020   ALBUMIN 3.6 12/04/2020   ALKPHOS 59 12/04/2020   HCVAB 0.1 12/16/2016   LIPASE 87 (H) 12/04/2020    Electrolytes Lab Results  Component Value Date   NA 138 12/12/2020   K 3.6 12/12/2020   CL 107 12/12/2020   CALCIUM 8.3 (L) 12/12/2020   MG 2.2 12/29/2020   PHOS 3.4 12/08/2020    Bone Lab Results  Component Value Date   25OHVITD1 8.9 (L) 12/29/2020   25OHVITD2 <1.0 12/29/2020   25OHVITD3 8.9 12/29/2020    Inflammation (CRP: Acute Phase) (ESR: Chronic Phase) Lab Results  Component Value Date   CRP 8 12/29/2020   ESRSEDRATE 62 (H) 12/29/2020  Note: Above Lab results reviewed.  Imaging  DG Knee Complete 4 Views Left CLINICAL DATA:  Fall on ice today.  Pain  EXAM: LEFT KNEE - COMPLETE 4 VIEW  COMPARISON:  None Available.  FINDINGS: No evidence of fracture, dislocation, or joint effusion. No evidence of arthropathy or other focal bone abnormality. Soft tissues are unremarkable.  IMPRESSION: Negative left knee radiographs is  Electronically Signed   By: Marin Roberts M.D.   On: 02/06/2023 12:39  Assessment  The primary encounter diagnosis was Chronic lower extremity pain (2ry area of Pain) (Bilateral) (L>R). 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  Plan of Care  Problem-specific:  No problem-specific Assessment & Plan notes found for this encounter.  Ms. IVRY DANESE has a current medication list which includes the following long-term medication(s): albuterol, amlodipine, and losartan.  Pharmacotherapy  (Medications Ordered): No orders of the defined types were placed in this encounter.  Orders:  Orders Placed This Encounter  Procedures   Lumbar Epidural Injection    Standing Status:   Future    Expiration Date:   05/18/2023    Scheduling Instructions:     Procedure: Interlaminar Lumbar Epidural Steroid injection (LESI)  L5-S1     Laterality: Right-sided     Sedation: With Sedation.     Timeframe: 2 weeks from now    Where will this procedure be performed?:   [facility]   Nursing Instructions:    Please complete this patient's postprocedure evaluation.    Scheduling Instructions:     Please complete this patient's postprocedure evaluation.   Follow-up plan:   Return in about 2 weeks (around 03/03/2023) for (ECT): (R) L5-S1 LESI #1.      Interventional Therapies  Risk  Complexity Considerations:   Abnormal UDS  GERD  nicotine dependence  hypertension     Planned  Pending:   Diagnostic/Therapeutic right L5-S1 LESI #1  (10/19/2022) referral to physical therapy entered to again try treatment of her low back and lower extremity pain.   Under consideration:   Diagnostic bilateral lumbar facet MBB #2  Diagnostic bilateral sacroiliac joint block #1  Diagnostic bilateral IA hip joint injection #1  Diagnostic bilateral trochanteric bursa injection #1    Completed:   Diagnostic/Therapeutic left L3-4 LESI x1 (02/03/2023) (100/90/0.  The patient fell onto her left side a couple days after her treatment.) Diagnostic left L4-5 LESI x2 (12/15/2021) (10-0/10) (1st:100/100/80 x1 week/0) (10-0/10) (2nd:100/100/60 x1 week/0)  Diagnostic bilateral lumbar facet MBB x1 (10/05/2022) (8-0/10) (100/100/100 x 3 days/10-20)  PT referral (12/29/2020).  She describes having attended twice a week for 2-3 weeks for a total of 4-5 sessions.  Not only did the physical therapy did not help the pain but it actually worsened it.   Completed by Filomena Jungling, MD [facility] PMR):    Diagnostic/therapeutic bilateral L5 TFESI x2 (06/04/2019 & 08/02/2019) (no benefit & painful)    Therapeutic  Palliative (PRN) options:   None established      Recent Visits Date Type Provider Dept  02/03/23 Procedure visit Delano Metz, MD [facility]  01/17/23 Office Visit Delano Metz, MD [facility]  Showing recent visits within past 90 days and meeting all other requirements Today's Visits Date Type Provider Dept  02/17/23 Office Visit Delano Metz, MD [facility]  Showing today's visits and meeting all other requirements Future Appointments No visits were found meeting these conditions. Showing future appointments within next 90 days and meeting all other requirements  I discussed the assessment and treatment plan with the patient. The patient was provided an opportunity to ask questions and all were answered. The patient agreed with the plan and demonstrated an understanding of the instructions.  Patient advised to call back or seek an in-person evaluation if the symptoms or condition worsens.  Duration of encounter: 18 minutes.  Note by: Oswaldo Done, MD Date: 02/17/2023; Time: 12:22 PM

## 2023-02-21 ENCOUNTER — Encounter: Payer: Medicaid Other | Admitting: Physical Therapy

## 2023-02-22 ENCOUNTER — Other Ambulatory Visit: Payer: Self-pay | Admitting: Physician Assistant

## 2023-02-22 ENCOUNTER — Encounter: Payer: BLUE CROSS/BLUE SHIELD | Admitting: Physical Therapy

## 2023-02-22 ENCOUNTER — Telehealth: Payer: Self-pay | Admitting: Neurosurgery

## 2023-02-22 MED ORDER — DIAZEPAM 5 MG PO TABS: 5.0000 mg | ORAL_TABLET | Freq: Once | ORAL | 0 refills | Status: AC

## 2023-02-22 NOTE — Telephone Encounter (Signed)
Patient notified

## 2023-02-22 NOTE — Telephone Encounter (Signed)
Patient has called stating she has 2 MRI's to complete tomorrow and would like medication sent to her pharmacy for her anxiety.   Please advise

## 2023-02-23 ENCOUNTER — Encounter: Payer: Medicaid Other | Admitting: Physical Therapy

## 2023-02-23 ENCOUNTER — Ambulatory Visit: Payer: BLUE CROSS/BLUE SHIELD

## 2023-02-23 ENCOUNTER — Ambulatory Visit
Admission: RE | Admit: 2023-02-23 | Discharge: 2023-02-23 | Disposition: A | Discharge status: Home / Self Care | Payer: BLUE CROSS/BLUE SHIELD | Source: Ambulatory Visit | Attending: Neurosurgery | Admitting: Neurosurgery

## 2023-02-23 DIAGNOSIS — M5441 Lumbago with sciatica, right side: Secondary | ICD-10-CM | POA: Insufficient documentation

## 2023-02-23 DIAGNOSIS — M542 Cervicalgia: Secondary | ICD-10-CM | POA: Diagnosis present

## 2023-02-23 DIAGNOSIS — G8929 Other chronic pain: Secondary | ICD-10-CM | POA: Diagnosis present

## 2023-02-23 DIAGNOSIS — M5442 Lumbago with sciatica, left side: Secondary | ICD-10-CM | POA: Diagnosis present

## 2023-02-28 ENCOUNTER — Encounter: Payer: Medicaid Other | Admitting: Physical Therapy

## 2023-03-01 ENCOUNTER — Encounter: Payer: BLUE CROSS/BLUE SHIELD | Admitting: Physical Therapy

## 2023-03-02 ENCOUNTER — Encounter: Payer: Medicaid Other | Admitting: Physical Therapy

## 2023-03-03 ENCOUNTER — Encounter: Payer: Self-pay | Admitting: Neurosurgery

## 2023-03-04 ENCOUNTER — Encounter: Payer: BLUE CROSS/BLUE SHIELD | Admitting: Physical Therapy

## 2023-03-07 ENCOUNTER — Ambulatory Visit: Payer: Medicaid Other | Admitting: Physical Therapy

## 2023-03-08 ENCOUNTER — Encounter: Payer: BLUE CROSS/BLUE SHIELD | Admitting: Physical Therapy

## 2023-03-09 ENCOUNTER — Encounter: Payer: Medicaid Other | Admitting: Physical Therapy

## 2023-03-11 ENCOUNTER — Encounter: Payer: BLUE CROSS/BLUE SHIELD | Admitting: Physical Therapy

## 2023-03-15 ENCOUNTER — Encounter: Payer: BLUE CROSS/BLUE SHIELD | Admitting: Physical Therapy

## 2023-03-18 ENCOUNTER — Encounter: Payer: BLUE CROSS/BLUE SHIELD | Admitting: Physical Therapy

## 2023-03-22 ENCOUNTER — Encounter: Payer: BLUE CROSS/BLUE SHIELD | Admitting: Physical Therapy

## 2023-03-25 ENCOUNTER — Encounter: Payer: BLUE CROSS/BLUE SHIELD | Admitting: Physical Therapy

## 2023-03-26 ENCOUNTER — Emergency Department
Admission: EM | Admit: 2023-03-26 | Discharge: 2023-03-26 | Disposition: A | Discharge status: Home / Self Care | Attending: Emergency Medicine | Admitting: Emergency Medicine

## 2023-03-26 ENCOUNTER — Encounter: Payer: Self-pay | Admitting: Intensive Care

## 2023-03-26 ENCOUNTER — Emergency Department

## 2023-03-26 ENCOUNTER — Other Ambulatory Visit: Payer: Self-pay

## 2023-03-26 DIAGNOSIS — Y990 Civilian activity done for income or pay: Secondary | ICD-10-CM | POA: Diagnosis not present

## 2023-03-26 DIAGNOSIS — X501XXA Overexertion from prolonged static or awkward postures, initial encounter: Secondary | ICD-10-CM | POA: Insufficient documentation

## 2023-03-26 DIAGNOSIS — S8992XA Unspecified injury of left lower leg, initial encounter: Secondary | ICD-10-CM | POA: Insufficient documentation

## 2023-03-26 MED ORDER — MELOXICAM 15 MG PO TABS: 15.0000 mg | ORAL_TABLET | Freq: Every day | ORAL | 0 refills | Status: AC

## 2023-03-26 MED ORDER — KETOROLAC TROMETHAMINE 30 MG/ML IJ SOLN
30.0000 mg | Freq: Once | INTRAMUSCULAR | Status: AC
  Administered 2023-03-26: 30 mg via INTRAMUSCULAR
  Filled 2023-03-26: qty 1

## 2023-03-26 NOTE — ED Provider Notes (Signed)
 [facility] Emergency Department Provider Note     Event Date/Time   First MD Initiated Contact with Patient 03/26/23 1127     (approximate)   History   Knee Injury   HPI  Cassidy Mathis is a 48 y.o. female presents to the ED for evaluation of left knee pain.  Patient reports her knee buckled and twisted yesterday at work while getting up from a chair.  Denies direct impact or injury to the knee.  Patient reports history of knee pain and 1 month ago slipping on ice on the same knee.  Pain is worse with knee flexion.  Patient is ambulatory.     Physical Exam   Triage Vital Signs: ED Triage Vitals  Encounter Vitals Group     BP 03/26/23 1100 (!) 143/104     Systolic BP Percentile --      Diastolic BP Percentile --      Pulse Rate 03/26/23 1100 78     Resp 03/26/23 1100 17     Temp 03/26/23 1100 98.3 F (36.8 C)     Temp Source 03/26/23 1100 Oral     SpO2 03/26/23 1100 100 %     Weight 03/26/23 1100 207 lb (93.9 kg)     Height 03/26/23 1100 5\' 5"  (1.651 m)     Head Circumference --      Peak Flow --      Pain Score 03/26/23 1104 10     Pain Loc --      Pain Education --      Exclude from Growth Chart --     Most recent vital signs: Vitals:   03/26/23 1100  BP: (!) 143/104  Pulse: 78  Resp: 17  Temp: 98.3 F (36.8 C)  SpO2: 100%    General Awake, no distress.  HEENT NCAT. PERRL. EOMI. No rhinorrhea. Mucous membranes are moist.  CV:  Good peripheral perfusion.  RESP:  Normal effort.  ABD:  No distention.  Other:  Anterior aspect of knee appears with mild swelling.  Skin is intact and no ecchymosis noted.  Tenderness along the posterior medial distal femur. Negative anterior drawer test, negative varus and valgus test.  Limited ROM due to pain.    ED Results / Procedures / Treatments   Labs (all labs ordered are listed, but only abnormal results are displayed) Labs Reviewed - No data to display  RADIOLOGY  I personally  viewed and evaluated these images as part of my medical decision making, as well as reviewing the written report by the radiologist.  ED Provider Interpretation: No acute bony abnormality.  DG Knee Complete 4 Views Left Result Date: 03/26/2023 CLINICAL DATA:  Left knee injury at work yesterday. Twisting injury. EXAM: LEFT KNEE - COMPLETE 4+ VIEW COMPARISON:  Radiographs 02/06/2023 FINDINGS: The mineralization and alignment are normal. There is no evidence of acute fracture or dislocation. The joint spaces are preserved. Stable small subchondral cyst in the medial tibial plateau. No significant joint effusion or other focal soft tissue abnormality identified. IMPRESSION: Stable left knee radiographs.  No acute osseous findings. Electronically Signed   By: Carey Bullocks M.D.   On: 03/26/2023 11:40    PROCEDURES:  Critical Care performed: No  Procedures   MEDICATIONS ORDERED IN ED: Medications - No data to display   IMPRESSION / MDM / ASSESSMENT AND PLAN / ED COURSE  I reviewed the triage vital signs and the nursing notes.  48 y.o. female presents to the emergency department for evaluation and treatment of left knee pain. See HPI for further details.   Differential diagnosis includes, but is not limited to fracture, dislocation, strain, ligamentous injury   Patient's presentation is most consistent with acute complicated illness / injury requiring diagnostic workup.  Patient is alert and oriented.  She is hemodynamically stable.  Physical exam findings are stated above.  No visible deformity noted.  X-ray is reassuring of any acute bony abnormalities.  I do suspect muscle strain versus ligamentous injury, considering a meniscus injury.    Chart reviewed. Patient has a history of chronic left knee pain. She was evaluated by orthopedics on 03/27 and received a large joint injection of bupivacaine/lidocaine.   Patient is placed in a knee immobilizer and  provided crutches to weight-bear as tolerated.  She is encouraged to follow-up with orthopedic for further management as needed.  ED return precautions discussed.   FINAL CLINICAL IMPRESSION(S) / ED DIAGNOSES   Final diagnoses:  None   Rx / DC Orders   ED Discharge Orders     None       Note:  This document was prepared using Dragon voice recognition software and may include unintentional dictation errors.    Romeo Apple, Jamil Castillo A, PA-C 03/26/23 1409    Sharyn Creamer, MD 03/26/23 [id number]

## 2023-03-26 NOTE — Discharge Instructions (Addendum)
 Follow up with orthopedics for further management as needed. Gradual increase your weightbearing with the assistance of the crutches as tolerated.

## 2023-03-26 NOTE — ED Triage Notes (Signed)
 Patient reports injuring left knee at work yesterday. Reports she twisted it.   Reports she slipped on ice a few months ago and also injured her left knee.

## 2023-03-29 ENCOUNTER — Encounter: Payer: BLUE CROSS/BLUE SHIELD | Admitting: Physical Therapy

## 2023-04-01 ENCOUNTER — Encounter: Payer: BLUE CROSS/BLUE SHIELD | Admitting: Physical Therapy

## 2023-04-05 ENCOUNTER — Encounter: Payer: BLUE CROSS/BLUE SHIELD | Admitting: Physical Therapy

## 2023-04-08 ENCOUNTER — Encounter: Payer: BLUE CROSS/BLUE SHIELD | Admitting: Physical Therapy

## 2023-04-11 ENCOUNTER — Other Ambulatory Visit: Payer: Self-pay | Admitting: Student

## 2023-04-11 DIAGNOSIS — G8929 Other chronic pain: Secondary | ICD-10-CM

## 2023-04-11 DIAGNOSIS — W19XXXS Unspecified fall, sequela: Secondary | ICD-10-CM

## 2023-04-11 DIAGNOSIS — M2392 Unspecified internal derangement of left knee: Secondary | ICD-10-CM

## 2023-04-12 ENCOUNTER — Encounter: Payer: BLUE CROSS/BLUE SHIELD | Admitting: Physical Therapy

## 2023-04-15 ENCOUNTER — Encounter: Payer: BLUE CROSS/BLUE SHIELD | Admitting: Physical Therapy

## 2023-04-19 ENCOUNTER — Ambulatory Visit
Admission: RE | Admit: 2023-04-19 | Discharge: 2023-04-19 | Disposition: A | Discharge status: Home / Self Care | Source: Ambulatory Visit | Attending: Student | Admitting: Student

## 2023-04-19 DIAGNOSIS — M2392 Unspecified internal derangement of left knee: Secondary | ICD-10-CM

## 2023-04-19 DIAGNOSIS — W19XXXS Unspecified fall, sequela: Secondary | ICD-10-CM

## 2023-04-19 DIAGNOSIS — G8929 Other chronic pain: Secondary | ICD-10-CM

## 2023-05-17 ENCOUNTER — Other Ambulatory Visit: Payer: Self-pay | Admitting: Orthopedic Surgery

## 2023-05-18 ENCOUNTER — Other Ambulatory Visit: Payer: Self-pay | Admitting: Orthopedic Surgery

## 2023-05-19 ENCOUNTER — Other Ambulatory Visit: Payer: Self-pay

## 2023-05-19 ENCOUNTER — Encounter: Payer: Self-pay | Admitting: Orthopedic Surgery

## 2023-05-19 ENCOUNTER — Inpatient Hospital Stay
Admission: RE | Admit: 2023-05-19 | Discharge: 2023-05-19 | Disposition: A | Discharge status: Home / Self Care | Source: Ambulatory Visit

## 2023-05-27 ENCOUNTER — Ambulatory Visit: Payer: Worker's Compensation | Admitting: Anesthesiology

## 2023-05-27 ENCOUNTER — Other Ambulatory Visit: Payer: Self-pay

## 2023-05-27 ENCOUNTER — Encounter: Payer: Self-pay | Admitting: Orthopedic Surgery

## 2023-05-27 ENCOUNTER — Ambulatory Visit
Admission: RE | Admit: 2023-05-27 | Discharge: 2023-05-27 | Disposition: A | Discharge status: Home / Self Care | Payer: Worker's Compensation | Attending: Orthopedic Surgery | Admitting: Orthopedic Surgery

## 2023-05-27 ENCOUNTER — Encounter
Admission: RE | Disposition: A | Discharge status: Home / Self Care | Payer: Self-pay | Source: Home / Self Care | Attending: Orthopedic Surgery

## 2023-05-27 DIAGNOSIS — M1712 Unilateral primary osteoarthritis, left knee: Secondary | ICD-10-CM | POA: Insufficient documentation

## 2023-05-27 DIAGNOSIS — S83252A Bucket-handle tear of lateral meniscus, current injury, left knee, initial encounter: Secondary | ICD-10-CM | POA: Diagnosis present

## 2023-05-27 DIAGNOSIS — I10 Essential (primary) hypertension: Secondary | ICD-10-CM | POA: Insufficient documentation

## 2023-05-27 DIAGNOSIS — W010XXA Fall on same level from slipping, tripping and stumbling without subsequent striking against object, initial encounter: Secondary | ICD-10-CM | POA: Diagnosis not present

## 2023-05-27 DIAGNOSIS — F17210 Nicotine dependence, cigarettes, uncomplicated: Secondary | ICD-10-CM | POA: Insufficient documentation

## 2023-05-27 HISTORY — DX: Anxiety disorder, unspecified: F41.9

## 2023-05-27 HISTORY — PX: KNEE ARTHROSCOPY WITH MENISCAL REPAIR: SHX5653

## 2023-05-27 HISTORY — DX: Claustrophobia: F40.240

## 2023-05-27 SURGERY — ARTHROSCOPY, KNEE, WITH MENISCUS REPAIR
Anesthesia: General | Site: Knee | Laterality: Left

## 2023-05-27 MED ORDER — MIDAZOLAM HCL 2 MG/2ML IJ SOLN
2.0000 mg | Freq: Once | INTRAMUSCULAR | Status: AC
  Administered 2023-05-27: 2 mg via INTRAVENOUS

## 2023-05-27 MED ORDER — HYDROMORPHONE HCL 1 MG/ML IJ SOLN: 0.5000 mg | INTRAMUSCULAR | Status: DC | PRN

## 2023-05-27 MED ORDER — ASPIRIN 325 MG PO TBEC: 325.0000 mg | DELAYED_RELEASE_TABLET | Freq: Every day | ORAL | 0 refills | Status: AC

## 2023-05-27 MED ORDER — LIDOCAINE-EPINEPHRINE 1 %-1:100000 IJ SOLN
INTRAMUSCULAR | Status: DC | PRN
  Administered 2023-05-27: 20 mL

## 2023-05-27 MED ORDER — MIDAZOLAM HCL 2 MG/2ML IJ SOLN
INTRAMUSCULAR | Status: AC
  Filled 2023-05-27: qty 2

## 2023-05-27 MED ORDER — DEXMEDETOMIDINE HCL IN NACL 200 MCG/50ML IV SOLN
INTRAVENOUS | Status: DC | PRN
  Administered 2023-05-27: 8 ug via INTRAVENOUS

## 2023-05-27 MED ORDER — HYDROCODONE-ACETAMINOPHEN 5-325 MG PO TABS: 1.0000 | ORAL_TABLET | ORAL | 0 refills | Status: DC | PRN

## 2023-05-27 MED ORDER — FENTANYL CITRATE PF 50 MCG/ML IJ SOSY: 50.0000 ug | PREFILLED_SYRINGE | INTRAMUSCULAR | Status: DC | PRN

## 2023-05-27 MED ORDER — KETOROLAC TROMETHAMINE 30 MG/ML IJ SOLN
INTRAMUSCULAR | Status: DC | PRN
  Administered 2023-05-27: 30 mg via INTRAVENOUS

## 2023-05-27 MED ORDER — PHENYLEPHRINE HCL (PRESSORS) 10 MG/ML IV SOLN
INTRAVENOUS | Status: DC | PRN
Start: 2023-05-27 — End: 2023-05-27
  Administered 2023-05-27: 120 ug via INTRAVENOUS
  Administered 2023-05-27 (×4): 80 ug via INTRAVENOUS

## 2023-05-27 MED ORDER — CEFAZOLIN SODIUM-DEXTROSE 2-3 GM-%(50ML) IV SOLR
INTRAVENOUS | Status: AC
  Filled 2023-05-27: qty 50

## 2023-05-27 MED ORDER — OXYCODONE HCL 5 MG PO TABS
ORAL_TABLET | ORAL | Status: AC
  Filled 2023-05-27: qty 2

## 2023-05-27 MED ORDER — IBUPROFEN 800 MG PO TABS
800.0000 mg | ORAL_TABLET | Freq: Three times a day (TID) | ORAL | 0 refills | Status: AC
Start: 2023-05-27 — End: 2023-06-10

## 2023-05-27 MED ORDER — FENTANYL CITRATE (PF) 100 MCG/2ML IJ SOLN
INTRAMUSCULAR | Status: AC
  Filled 2023-05-27: qty 2

## 2023-05-27 MED ORDER — MIDAZOLAM HCL 2 MG/2ML IJ SOLN
INTRAMUSCULAR | Status: DC | PRN
  Administered 2023-05-27 (×2): 2 mg via INTRAVENOUS

## 2023-05-27 MED ORDER — CEFAZOLIN SODIUM-DEXTROSE 2-3 GM-%(50ML) IV SOLR: INTRAVENOUS | Status: DC | PRN

## 2023-05-27 MED ORDER — CEFAZOLIN SODIUM-DEXTROSE 2-4 GM/100ML-% IV SOLN
2.0000 g | INTRAVENOUS | Status: AC
  Administered 2023-05-27: 2 g via INTRAVENOUS

## 2023-05-27 MED ORDER — PROPOFOL 10 MG/ML IV BOLUS
INTRAVENOUS | Status: DC | PRN
  Administered 2023-05-27: 50 mg via INTRAVENOUS
  Administered 2023-05-27: 200 mg via INTRAVENOUS

## 2023-05-27 MED ORDER — ACETAMINOPHEN 10 MG/ML IV SOLN
INTRAVENOUS | Status: AC
  Filled 2023-05-27: qty 100

## 2023-05-27 MED ORDER — RINGERS IRRIGATION IR SOLN
Status: DC | PRN
  Administered 2023-05-27: 9000 mL

## 2023-05-27 MED ORDER — ACETAMINOPHEN 500 MG PO TABS: 1000.0000 mg | ORAL_TABLET | Freq: Three times a day (TID) | ORAL | 2 refills | Status: DC

## 2023-05-27 MED ORDER — FENTANYL CITRATE (PF) 100 MCG/2ML IJ SOLN
INTRAMUSCULAR | Status: DC | PRN
  Administered 2023-05-27: 100 ug via INTRAVENOUS

## 2023-05-27 MED ORDER — OXYCODONE HCL 5 MG PO TABS
10.0000 mg | ORAL_TABLET | Freq: Once | ORAL | Status: AC
  Administered 2023-05-27: 10 mg via ORAL

## 2023-05-27 MED ORDER — ACETAMINOPHEN 10 MG/ML IV SOLN
INTRAVENOUS | Status: DC | PRN
  Administered 2023-05-27: 1000 mg via INTRAVENOUS

## 2023-05-27 MED ORDER — ONDANSETRON HCL 4 MG/2ML IJ SOLN
INTRAMUSCULAR | Status: DC | PRN
  Administered 2023-05-27: 4 mg via INTRAVENOUS

## 2023-05-27 MED ORDER — ONDANSETRON 4 MG PO TBDP: 4.0000 mg | ORAL_TABLET | Freq: Three times a day (TID) | ORAL | 0 refills | Status: DC | PRN

## 2023-05-27 MED ORDER — LIDOCAINE HCL (CARDIAC) PF 100 MG/5ML IV SOSY
PREFILLED_SYRINGE | INTRAVENOUS | Status: DC | PRN
  Administered 2023-05-27: 100 mg via INTRAVENOUS

## 2023-05-27 MED ORDER — SODIUM CHLORIDE 0.9 % IV SOLN: INTRAVENOUS | Status: DC | PRN

## 2023-05-27 MED ORDER — DEXAMETHASONE SODIUM PHOSPHATE 10 MG/ML IJ SOLN
INTRAMUSCULAR | Status: DC | PRN
  Administered 2023-05-27: 4 mg via INTRAVENOUS
  Administered 2023-05-27 (×2): 8 mg via INTRAVENOUS

## 2023-05-27 SURGICAL SUPPLY — 32 items
ADAPTER IRRIG TUBE 2 SPIKE SOL (ADAPTER) ×3 IMPLANT
BLADE FULL RADIUS 3.5 (BLADE) ×3 IMPLANT
BLADE SHAVER 4.5X7 STR FR (MISCELLANEOUS) ×3 IMPLANT
BLADE SHAVER AGGRES 5.5 STR (CUTTER) ×1 IMPLANT
BLADE SURG SZ11 CARB STEEL (BLADE) ×3 IMPLANT
BNDG ESMARCH 6X12 STRL LF (GAUZE/BANDAGES/DRESSINGS) ×3 IMPLANT
CHLORAPREP W/TINT 26 (MISCELLANEOUS) ×3 IMPLANT
COOLER POLAR GLACIER W/PUMP (MISCELLANEOUS) ×3 IMPLANT
COVER LIGHT HANDLE 1/PK (MISCELLANEOUS) ×1 IMPLANT
DRAPE ARTHROSCOPY W/POUCH 90 (DRAPES) ×5 IMPLANT
DRAPE IMP U-DRAPE 54X76 (DRAPES) ×3 IMPLANT
ELECTRODE REM PT RTRN 9FT ADLT (ELECTROSURGICAL) ×3 IMPLANT
GAUZE SPONGE 4X4 12PLY STRL (GAUZE/BANDAGES/DRESSINGS) ×3 IMPLANT
GLOVE BIOGEL PI IND STRL 8 (GLOVE) ×6 IMPLANT
GLOVE SURG ORTHO 8.0 STRL STRW (GLOVE) ×3 IMPLANT
GLOVE SURG SYN 7.5 E (GLOVE) ×6 IMPLANT
GLOVE SURG SYN 7.5 PF PI (GLOVE) ×2 IMPLANT
GOWN STRL REUS W/ TWL LRG LVL3 (GOWN DISPOSABLE) ×3 IMPLANT
GOWN STRL REUS W/ TWL XL LVL3 (GOWN DISPOSABLE) ×5 IMPLANT
IV LR IRRIG 3000ML ARTHROMATIC (IV SOLUTION) ×11 IMPLANT
KIT TURNOVER KIT A (KITS) ×3 IMPLANT
MANIFOLD NEPTUNE II (INSTRUMENTS) ×5 IMPLANT
MAT ABSORB FLUID 56X50 GRAY (MISCELLANEOUS) ×5 IMPLANT
PACK ARTHROSCOPY KNEE (MISCELLANEOUS) ×3 IMPLANT
PAD ABD DERMACEA PRESS 5X9 (GAUZE/BANDAGES/DRESSINGS) ×5 IMPLANT
PAD WRAPON POLAR KNEE (MISCELLANEOUS) ×3 IMPLANT
PADDING CAST COTTON 6X4 NS (MISCELLANEOUS) ×3 IMPLANT
SPONGE T-LAP 18X18 ~~LOC~~+RFID (SPONGE) ×3 IMPLANT
SUTURE EHLN 3-0 FS-10 30 BLK (SUTURE) ×3 IMPLANT
TUBE SET DOUBLEFLO INFLOW (TUBING) ×3 IMPLANT
TUBE SET DOUBLEFLO OUTFLOW (TUBING) ×3 IMPLANT
WAND WEREWOLF FLOW 90D (MISCELLANEOUS) ×3 IMPLANT

## 2023-05-27 NOTE — Transfer of Care (Signed)
 Immediate Anesthesia Transfer of Care Note  Patient: Edina L Allman  Procedure(s) Performed: ARTHROSCOPY, KNEE with menisectomy (Left: Knee)  Patient Location: PACU  Anesthesia Type:General  Level of Consciousness: awake  Airway & Oxygen Therapy: Patient Spontanous Breathing and Patient connected to face mask oxygen  Post-op Assessment: Report given to RN and Post -op Vital signs reviewed and stable  Post vital signs: Reviewed and stable  Last Vitals:  Vitals Value Taken Time  BP 113/66 05/27/23 1045  Temp 36.8 C 05/27/23 1044  Pulse 64 05/27/23 1050  Resp 14 05/27/23 1050  SpO2 100 % 05/27/23 1050  Vitals shown include unfiled device data.  Last Pain:  Vitals:   05/27/23 1044  TempSrc:   PainSc: Asleep         Complications: No notable events documented.

## 2023-05-27 NOTE — Discharge Instructions (Signed)
Arthroscopic Knee Surgery - Partial Meniscectomy   Post-Op Instructions   1. Bracing or crutches: Crutches will be provided at the time of discharge from the surgery center if you do not already have them.   2. Ice: You may be provided with a device (Polar Care) that allows you to ice the affected area effectively. Otherwise you can ice manually.    3. Driving:  Plan on not driving for at least two weeks. Please note that you are advised NOT to drive while taking narcotic pain medications as you may be impaired and unsafe to drive.   4. Activity: Ankle pumps several times an hour while awake to prevent blood clots. Weight bearing: as tolerated. Use crutches for as needed (usually ~1 week or less) until pain allows you to ambulate without a limp. Bending and straightening the knee is unlimited. Elevate knee above heart level as much as possible for one week. Avoid standing more than 5 minutes (consecutively) for the first week.  Avoid long distance travel for 2 weeks.  5. Medications:  - You have been provided a prescription for narcotic pain medicine. After surgery, take 1-2 narcotic tablets every 4 hours if needed for severe pain.  - You may take up to 3000mg/day of tylenol (acetaminophen). You can take 1000mg 3x/day. Please check your narcotic. If you have acetaminophen in your narcotic (each tablet will be 325mg), be careful not to exceed a total of 3000mg/day of acetaminophen.  - A prescription for anti-nausea medication will be provided in case the narcotic medicine or anesthesia causes nausea - take 1 tablet every 6 hours only if nauseated.  - Take ibuprofen 800 mg every 8 hours WITH food to reduce post-operative knee swelling. DO NOT STOP IBUPROFEN POST-OP UNTIL INSTRUCTED TO DO SO at first post-op office visit (10-14 days after surgery). However, please discontinue if you have any abdominal discomfort after taking this.  - Take enteric coated aspirin 325 mg once daily for 2 weeks to prevent  blood clots.    6. Bandages: The physical therapist should change the bandages at the first post-op appointment. If needed, the dressing supplies have been provided to you.   7. Physical Therapy: 1-2 times per week for 6 weeks. Therapy typically starts on post operative Day 3 or 4. You have been provided an order for physical therapy. The therapist will provide home exercises.   8. Work: May do light duty/desk job in approximately 1-2 weeks when off of narcotics, pain is well-controlled, and swelling has decreased. Labor intensive jobs may require 4-6 weeks to return.      9. Post-Op Appointments: Your first post-op appointment will be with Dr. Nichoel Digiulio in approximately 2 weeks time.    If you find that they have not been scheduled please call the Orthopaedic Appointment front desk at [phone].  

## 2023-05-27 NOTE — Anesthesia Postprocedure Evaluation (Signed)
 Anesthesia Post Note  Patient: Cassidy Mathis  Procedure(s) Performed: ARTHROSCOPY, KNEE with menisectomy (Left: Knee)  Patient location during evaluation: PACU Anesthesia Type: General Level of consciousness: awake and alert Pain management: pain level controlled Vital Signs Assessment: post-procedure vital signs reviewed and stable Respiratory status: spontaneous breathing, nonlabored ventilation, respiratory function stable and patient connected to nasal cannula oxygen Cardiovascular status: blood pressure returned to baseline and stable Postop Assessment: no apparent nausea or vomiting Anesthetic complications: no   No notable events documented.   Last Vitals:  Vitals:   05/27/23 1100 05/27/23 1115  BP: (!) 116/59 124/85  Pulse: 80 63  Resp: 14 14  Temp: 36.8 C   SpO2: 100% 98%    Last Pain:  Vitals:   05/27/23 1058  TempSrc:   PainSc: 4                  Analisse Randle C Romano Stigger

## 2023-05-27 NOTE — Anesthesia Preprocedure Evaluation (Addendum)
 Anesthesia Evaluation  Patient identified by MRN, date of birth, ID band Patient awake    Reviewed: Allergy & Precautions, H&P , NPO status , Patient's Chart, lab work & pertinent test results  Airway Mallampati: II  TM Distance: >3 FB Neck ROM: Full    Dental no notable dental hx.    Pulmonary asthma , pneumonia, Current Smoker and Patient abstained from smoking.   Pulmonary exam normal breath sounds clear to auscultation       Cardiovascular hypertension, Normal cardiovascular exam+ Valvular Problems/Murmurs  Rhythm:Regular Rate:Normal     Neuro/Psych  Headaches PSYCHIATRIC DISORDERS  Depression     Neuromuscular disease negative neurological ROS  negative psych ROS   GI/Hepatic negative GI ROS, Neg liver ROS,GERD  ,,  Endo/Other  negative endocrine ROS    Renal/GU negative Renal ROS  negative genitourinary   Musculoskeletal negative musculoskeletal ROS (+) Arthritis ,    Abdominal   Peds negative pediatric ROS (+)  Hematology negative hematology ROS (+) Blood dyscrasia, anemia   Anesthesia Other Findings Depression  Allergy Headache  Pilonidal cyst Hypertension  Pancreatitis GERD (gastroesophageal reflux disease) Asthma Pneumonia  Cancer (HCC) Anemia  Sciatica Obesity Claustrophobia Anxiety--will administer preop versed  IV per patient request, states she is "very nervous"   Reproductive/Obstetrics negative OB ROS                             Anesthesia Physical Anesthesia Plan  ASA: 2  Anesthesia Plan: General   Post-op Pain Management:    Induction: Intravenous  PONV Risk Score and Plan:   Airway Management Planned: LMA  Additional Equipment:   Intra-op Plan:   Post-operative Plan: Extubation in OR  Informed Consent: I have reviewed the patients History and Physical, chart, labs and discussed the procedure including the risks, benefits and alternatives for the  proposed anesthesia with the patient or authorized representative who has indicated his/her understanding and acceptance.     Dental Advisory Given  Plan Discussed with: Anesthesiologist, CRNA and Surgeon  Anesthesia Plan Comments: (Patient consented for risks of anesthesia including but not limited to:  - adverse reactions to medications - damage to eyes, teeth, lips or other oral mucosa - nerve damage due to positioning  - sore throat or hoarseness - Damage to heart, brain, nerves, lungs, other parts of body or loss of life  Patient voiced understanding and assent.)         Anesthesia Quick Evaluation

## 2023-05-27 NOTE — Op Note (Addendum)
 Operative Note    SURGERY DATE: 05/27/2023   PRE-OP DIAGNOSIS:  1. Left knee bucket-handle lateral meniscus tear 2. Left knee degenerative changes   POST-OP DIAGNOSIS:  1. Left knee bucket-handle lateral meniscus tear 2. Left knee discoid meniscus 3. Left knee degenerative changes  PROCEDURES:  1.  Left knee arthroscopy, partial lateral meniscectomy 2.  Left knee chondroplasty of patellofemoral and lateral compartments   SURGEON: Cleotilde Dago, MD  ASSISTANT: Marine Sia, PA    ANESTHESIA: Gen   ESTIMATED BLOOD LOSS: minimal   TOTAL IV FLUIDS: per anesthesia   INDICATION(S):  Gwenda L Sharpnack is a 48 y.o. female with signs and symptoms as well as MRI finding of bucket-handle lateral meniscus tear.  She has failed extensive nonoperative management.  After discussion of risks, benefits, and alternatives to surgery, the patient elected to proceed.  We discussed preoperatively that initial attempt would be to repair the meniscus tear, if at all possible.  If this was not possible, plan will be for partial meniscectomy.   OPERATIVE FINDINGS:    Examination under anesthesia: A careful examination under anesthesia was performed.  Passive range of motion was: Hyperextension: 2.  Extension: 0.  Flexion: 125.  Lachman: normal. Pivot Shift: normal.  Posterior drawer: normal.  Varus stability in full extension: normal.  Varus stability in 30 degrees of flexion: normal.  Valgus stability in full extension: normal.  Valgus stability in 30 degrees of flexion: normal.   Intra-operative findings: A thorough arthroscopic examination of the knee was performed.  The findings are: 1. Suprapatellar pouch: Normal 2. Undersurface of median ridge: Grade 1 softening 3. Medial patellar facet: Grade 1 softening 4. Lateral patellar facet: Grade 3 degenerative changes 5. Trochlea: Focal grade 1 degenerative changes 6. Lateral gutter/popliteus tendon: Normal 7. Hoffa's fat pad: Inflamed 8. Medial  gutter/plica: Normal 9. ACL: Normal 10. PCL: Normal 11. Medial meniscus: Normal 12. Medial compartment cartilage: Scattered grade 1-2 degenerative changes to the femoral condyle; grade 1 degenerative changes to the central tibial plateau 13. Lateral meniscus: The patient had an underlying complete discoid meniscus.  There was a bucket-handle tear of the posterior horn and body with flipped discoid fragment anteriorly.  Furthermore, there was a complete horizontal tear of the discoid fragment.  Additionally, there was a full width radial tear of the remnant rim at the meniscocapsular junction at the posterior horn/body junction.  Lastly, there was a vertical tear of the remnant posterior horn at the meniscocapsular junction with significant instability of the remnant posterior rim. 14. Lateral compartment cartilage: Significant grade 3 degenerative changes to the posterior/central tibial plateau measuring approximately 18 x 9 mm.  Grade 1 degenerative changes to the femoral condyle   OPERATIVE REPORT:     I identified Alycia L Girdler in the pre-operative holding area. I marked the operative knee with my initials. I reviewed the risks and benefits of the proposed surgical intervention and the patient wished to proceed. The patient was transferred to the operative suite and placed in the supine position with all bony prominences padded.  Anesthesia was administered. Appropriate IV antibiotics were administered prior to incision. The extremity was then prepped and draped in standard fashion. A time out was performed confirming the correct extremity, correct patient, and correct procedure.   Arthroscopy portals were marked. Local anesthetic was injected to the planned portal sites. The anterolateral portal was established with an 11 blade.      The arthroscope was placed in the anterolateral portal and then  into the suprapatellar pouch. Next, the medial portal was established under needle localization. A  diagnostic knee scope was completed with the above findings. The bucket-handle lateral meniscus tear was identified with flipped fragment anteriorly.   First, I started by excising the discoid portion of the meniscus using an arthroscopic biter.  After this was complete, there was a full width horizontal meniscus tear of the remnant of the torn/discoid fragment.  On further examination of the meniscus rim tissue, there was a vertical tear of the posterior horn at the meniscocapsular junction with significant instability of the remnant rim.  Furthermore, there was a full width radial tear at the posterior horn/body junction of the remnant rim of meniscus tissue.  The edges of this tear were significantly degenerative and appeared unlikely to hold any repair sutures.  They were gently debrided using an arthroscopic shaver, and remaining healthy tissue could not be mobilized enough to restore circumferential meniscus fibers.  Given this finding, as well as the complexity of the tear along with extensive degenerative changes to the tibial plateau at baseline, we elected to not attempt further repair as there would be a relatively high chance of failure.  Therefore, the meniscal tear was debrided using an arthroscopic biter and an oscillating shaver until the meniscus had stable borders. A chondroplasty was performed of the lateral compartment and patellofemoral compartment such that there were stable cartilage edges without any loose fragments of cartilage. Arthroscopic fluid was removed from the joint.   The portals were closed with 3-0 Nylon suture. Sterile dressings included Xeroform, 4x4s, Sof-Rol, and Bias wrap. A Polarcare was placed.  The patient was then awakened and taken to the PACU hemodynamically stable without complication.   Of note, assistance from a PA was essential to performing the surgery.  PA was present for the entire surgery.  PA assisted with patient positioning, retraction, instrumentation,  and wound closure. The surgery would have been more difficult and had longer operative time without PA assistance.    POSTOPERATIVE PLAN: The patient will be discharged home today once they meet PACU criteria. Aspirin  325 mg daily was prescribed for 2 weeks for DVT prophylaxis.  Physical therapy will start on POD#3-4. Weight-bearing as tolerated. Follow up in 2 weeks per protocol.

## 2023-05-27 NOTE — H&P (Signed)
 Paper H&P to be scanned into permanent record. H&P reviewed. No significant changes noted.

## 2023-05-28 ENCOUNTER — Other Ambulatory Visit: Payer: Self-pay

## 2023-05-28 ENCOUNTER — Telehealth: Admitting: Nurse Practitioner

## 2023-05-28 ENCOUNTER — Emergency Department
Admission: EM | Admit: 2023-05-28 | Discharge: 2023-05-28 | Disposition: A | Discharge status: Home / Self Care | Payer: Worker's Compensation | Attending: Emergency Medicine | Admitting: Emergency Medicine

## 2023-05-28 DIAGNOSIS — M25562 Pain in left knee: Secondary | ICD-10-CM

## 2023-05-28 DIAGNOSIS — L7682 Other postprocedural complications of skin and subcutaneous tissue: Secondary | ICD-10-CM

## 2023-05-28 NOTE — ED Provider Notes (Signed)
 [facility] Provider Note    Event Date/Time   First MD Initiated Contact with Patient 05/28/23 1511     (approximate)   History   Knee Pain   HPI  Cassidy Mathis is a 48 y.o. female with PMH of left meniscus tear, who had knee arthroscopy yesterday to repair this presents for postop bleeding.  Patient states that when she got home from surgery the dressing that was applied fell off.  She changed the dressing and put a new one on.  This morning when she woke up the dressing was saturated with blood.  She is no longer bleeding at this time.      Physical Exam   Triage Vital Signs: ED Triage Vitals [05/28/23 1316]  Encounter Vitals Group     BP (!) 143/86     Systolic BP Percentile      Diastolic BP Percentile      Pulse Rate 67     Resp 16     Temp 98.4 F (36.9 C)     Temp Source Oral     SpO2 98 %     Weight 210 lb (95.3 kg)     Height 5\' 5"  (1.651 m)     Head Circumference      Peak Flow      Pain Score 10     Pain Loc      Pain Education      Exclude from Growth Chart     Most recent vital signs: Vitals:   05/28/23 1316  BP: (!) 143/86  Pulse: 67  Resp: 16  Temp: 98.4 F (36.9 C)  SpO2: 98%   General: Awake, no distress.  CV:  Good peripheral perfusion.  Resp:  Normal effort.  Abd:  No distention.  Other:  Two incision sites on the anterior left knee with stitches intact, no active bleeding at this time, small amount of bruising surrounding the lateral incision.   ED Results / Procedures / Treatments   Labs (all labs ordered are listed, but only abnormal results are displayed) Labs Reviewed - No data to display   PROCEDURES:  Critical Care performed: No  Procedures   MEDICATIONS ORDERED IN ED: Medications - No data to display   IMPRESSION / MDM / ASSESSMENT AND PLAN / ED COURSE  I reviewed the triage vital signs and the nursing notes.                             48 year old female presents for  postsurgical bleeding from incision site in her knee.  Blood pressure was elevated otherwise vital signs are stable.  Patient NAD on exam.  Differential diagnosis includes, but is not limited to, postop infection, wound dehiscence, postop pain.  Patient's presentation is most consistent with acute, uncomplicated illness.  At the time of my assessment patient did not have any bleeding from the surgical site.  I redressed the wound using nonadherent gauze, then did a layer of regular gauze and secured with an Ace bandage.  I explained to the patient that some amount of postop bleeding is normal but this likely occurred because the dressing was not holding pressure over the wound.  I advised her to hold pressure for at least 20 minutes over the surgical site and elevate it if it starts bleeding at home.  Advised her to reach out to the orthopedic surgery team if this begins again.  She  can return to the ER if the office is closed.  Patient voiced understanding, all questions were answered and she was stable at discharge.      FINAL CLINICAL IMPRESSION(S) / ED DIAGNOSES   Final diagnoses:  Acute pain of left knee     Rx / DC Orders   ED Discharge Orders     None        Note:  This document was prepared using Dragon voice recognition software and may include unintentional dictation errors.   Phyliss Breen, PA-C 05/28/23 1535    Lind Repine, MD 05/28/23 1845

## 2023-05-28 NOTE — ED Triage Notes (Signed)
 Pt presents to ER from home with complaints of left knee incision bleeding. Pt reports she had meniscuses repair yesterday to left knee and at night she noticed the dressing with blood. Pt reports she changed dressing and applied a new one and today dressing was saturated with blood. RN changed dressing in triage, no bleeding noted on incisions. Applied new dressing and wrapped left knee.

## 2023-05-28 NOTE — Patient Instructions (Signed)
  Cassidy Mathis, thank you for joining Collins Dean, NP for today's virtual visit.  While this provider is not your primary care provider (PCP), if your PCP is located in our provider database this encounter information will be shared with them immediately following your visit.   A [facility] MyChart account gives you access to today's visit and all your visits, tests, and labs performed at [facility] " click here if you don't have a [facility] MyChart account or go to [email or web address]  Consent: (Patient) Cassidy Mathis provided verbal consent for this virtual visit at the beginning of the encounter.  Current Medications:  Current Outpatient Medications:    acetaminophen  (TYLENOL ) 500 MG tablet, Take 2 tablets (1,000 mg total) by mouth every 8 (eight) hours., Disp: 90 tablet, Rfl: 2   albuterol  (PROVENTIL  HFA;VENTOLIN  HFA) 108 (90 Base) MCG/ACT inhaler, Inhale 2 puffs into the lungs every 6 (six) hours as needed for wheezing or shortness of breath., Disp: 1 Inhaler, Rfl: 2   amLODipine  (NORVASC ) 10 MG tablet, Take 10 mg by mouth daily., Disp: , Rfl:    aspirin  EC 325 MG tablet, Take 1 tablet (325 mg total) by mouth daily for 14 days., Disp: 14 tablet, Rfl: 0   HYDROcodone -acetaminophen  (NORCO/VICODIN) 5-325 MG tablet, Take 1-2 tablets by mouth every 4 (four) hours as needed for moderate pain (pain score 4-6)., Disp: 10 tablet, Rfl: 0   ibuprofen (ADVIL) 800 MG tablet, Take 1 tablet (800 mg total) by mouth 3 (three) times daily for 14 days., Disp: 42 tablet, Rfl: 0   losartan (COZAAR) 50 MG tablet, Take 50 mg by mouth daily., Disp: , Rfl:    ondansetron  (ZOFRAN -ODT) 4 MG disintegrating tablet, Take 1 tablet (4 mg total) by mouth every 8 (eight) hours as needed for nausea or vomiting., Disp: 20 tablet, Rfl: 0   Medications ordered in this encounter:  No orders of the defined types were placed in this encounter.    *If you need refills on other medications prior  to your next appointment, please contact your pharmacy*  Follow-Up: Call back or seek an in-person evaluation if the symptoms worsen or if the condition fails to improve as anticipated.  [facility] [phone]  Other Instructions Call the orthopedic surgeon and go to the emergency room   If you have been instructed to have an in-person evaluation today at a local Urgent Care facility, please use the link below. It will take you to a list of all of our available [facility], including address, phone number and hours of operation. Please do not delay care.  [facility]  If you or a family member do not have a primary care provider, use the link below to schedule a visit and establish care. When you choose a [place] primary care physician or advanced practice provider, you gain a long-term partner in health. Find a Primary Care Provider  Learn more about [facility] in-office and virtual care options: [facility] - Get Care Now

## 2023-05-28 NOTE — Discharge Instructions (Addendum)
 Please keep the ace bandage over the knee. This is applying pressure over the wound to prevent it from bleeding again. If it begins to bleed, please hold pressure for 20 minutes and elevate the leg.   Follow up with orthopedics next week.

## 2023-05-28 NOTE — Progress Notes (Signed)
 Virtual Visit Consent   Cassidy Mathis, you are scheduled for a virtual visit with a [facility] provider today. Just as with appointments in the office, your consent must be obtained to participate. Your consent will be active for this visit and any virtual visit you may have with one of our providers in the next 365 days. If you have a MyChart account, a copy of this consent can be sent to you electronically.  As this is a virtual visit, video technology does not allow for your provider to perform a traditional examination. This may limit your provider's ability to fully assess your condition. If your provider identifies any concerns that need to be evaluated in person or the need to arrange testing (such as labs, EKG, etc.), we will make arrangements to do so. Although advances in technology are sophisticated, we cannot ensure that it will always work on either your end or our end. If the connection with a video visit is poor, the visit may have to be switched to a telephone visit. With either a video or telephone visit, we are not always able to ensure that we have a secure connection.  By engaging in this virtual visit, you consent to the provision of healthcare and authorize for your insurance to be billed (if applicable) for the services provided during this visit. Depending on your insurance coverage, you may receive a charge related to this service.  I need to obtain your verbal consent now. Are you willing to proceed with your visit today? Nimrat L Carby has provided verbal consent on 05/28/2023 for a virtual visit (video or telephone). Collins Dean, NP  Date: 05/28/2023 12:03 PM   Virtual Visit via Video Note   I, Collins Dean, connected with  Cassidy Mathis  ([id number], [birth year]) on 05/28/23 at 12:00 PM EDT by a video-enabled telemedicine application and verified that I am speaking with the correct person using two identifiers.  Location: Patient: Virtual Visit Location  Patient: Home Provider: Virtual Visit Location Provider: Home Office   I discussed the limitations of evaluation and management by telemedicine and the availability of in person appointments. The patient expressed understanding and agreed to proceed.    History of Present Illness: Cassidy Mathis is a 48 y.o. who identifies as a female who was assigned female at birth, and is being seen today for surgical complication.  Ms. Malley had knee arthroscopy/meniscal repair yesterday. She states the operative bandage she had placed started to unravel once she got home so yesterday evening she took the post op bandage off and placed her own ace wrap over it. She woke up with the dressing completely soaked with blood and now the bleeding will not stop  She had previously been instructed to keep the bandage on post op until physical therapy appt next week.   Problems:  Patient Active Problem List   Diagnosis Date Noted   Spondylosis without myelopathy or radiculopathy, lumbosacral region 09/06/2022   Lumbar facet joint pain 08/19/2022   Chronic low back pain (Bilateral) w/ sciatica (Bilateral) 12/29/2021   Lumbosacral radiculopathy at L5 (Bilateral) (L>R) 10/28/2021   Lumbosacral radiculopathy at S1 (Bilateral) (L>R) 10/28/2021   Weakness of lower extremity (Left) 10/28/2021   Chronic hip pain (Bilateral) (L>R) 10/28/2021   Greater trochanteric bursitis of hips (Bilateral) (R>L) 10/28/2021   DDD (degenerative disc disease), lumbosacral 10/28/2021   Chronic generalized pain 10/28/2021   Osteoarthritis of knee (Left) 10/28/2021   Elevated sed rate 04/27/2021  Vitamin D  deficiency 04/27/2021   Marijuana use 04/27/2021   Abnormal drug screen (12/29/2020) 04/27/2021   Chronic pain syndrome 12/29/2020   Pharmacologic therapy 12/29/2020   Disorder of skeletal system 12/29/2020   Problems influencing health status 12/29/2020   History of marijuana use 12/29/2020   Abnormal MRI, cervical spine  (05/06/2019) 12/29/2020   Abnormal MRI, lumbar spine (12/06/2021) 12/29/2020   GERD (gastroesophageal reflux disease) 12/29/2020   NSAID induced gastritis 12/29/2020   Chronic low back pain (1ry area of Pain) (Bilateral) (L>R) w/o sciatica 12/29/2020   Chronic lower extremity pain (2ry area of Pain) (Bilateral) (L>R) 12/29/2020   Chronic sacroiliac joint pain (Bilateral) (R>L) 12/29/2020   Lumbosacral facet syndrome (Bilateral) 12/29/2020   Lumbar facet hypertrophy (L4-5) (Bilateral) 12/29/2020   Lumbosacral facet arthropathy 12/29/2020   Somatic dysfunction of sacroiliac joints (Bilateral) 12/29/2020   Hypokalemia 12/11/2020   Acute blood loss anemia 12/07/2020   Vitamin B12 deficiency 12/06/2020   Rectal bleeding 12/04/2020   Precordial pain 11/28/2020   Palpitations 11/28/2020   Heart murmur 11/28/2020   Uncontrolled hypertension 11/28/2020   Right ovarian cyst    Acute bilateral low back pain with bilateral sciatica 06/01/2019   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) 06/01/2019   Cervical cancer, FIGO stage IB1 (HCC) 05/17/2017   Ileus (HCC) 04/18/2017   Postoperative anemia due to acute blood loss 04/18/2017   Tobacco use 04/06/2017   Malignant neoplasm of overlapping sites of cervix (HCC) 04/06/2017   Malignant neoplasm of exocervix (HCC)    Common bile duct stone    Pancreatitis 12/16/2016   Calculus of gallbladder with chronic cholecystitis without obstruction    Incarcerated epigastric hernia     Allergies:  Allergies  Allergen Reactions   Ace Inhibitors Swelling    ANGIOEDEMA   Lisinopril     Medications:  Current Outpatient Medications:    acetaminophen  (TYLENOL ) 500 MG tablet, Take 2 tablets (1,000 mg total) by mouth every 8 (eight) hours., Disp: 90 tablet, Rfl: 2   albuterol  (PROVENTIL  HFA;VENTOLIN  HFA) 108 (90 Base) MCG/ACT inhaler, Inhale 2 puffs into the lungs every 6 (six) hours as needed for wheezing or shortness of breath., Disp: 1 Inhaler, Rfl:  2   amLODipine  (NORVASC ) 10 MG tablet, Take 10 mg by mouth daily., Disp: , Rfl:    aspirin  EC 325 MG tablet, Take 1 tablet (325 mg total) by mouth daily for 14 days., Disp: 14 tablet, Rfl: 0   HYDROcodone -acetaminophen  (NORCO/VICODIN) 5-325 MG tablet, Take 1-2 tablets by mouth every 4 (four) hours as needed for moderate pain (pain score 4-6)., Disp: 10 tablet, Rfl: 0   ibuprofen (ADVIL) 800 MG tablet, Take 1 tablet (800 mg total) by mouth 3 (three) times daily for 14 days., Disp: 42 tablet, Rfl: 0   losartan (COZAAR) 50 MG tablet, Take 50 mg by mouth daily., Disp: , Rfl:    ondansetron  (ZOFRAN -ODT) 4 MG disintegrating tablet, Take 1 tablet (4 mg total) by mouth every 8 (eight) hours as needed for nausea or vomiting., Disp: 20 tablet, Rfl: 0  Observations/Objective: Patient is well-developed, well-nourished in no acute distress.  Resting comfortably at home.  Head is normocephalic, atraumatic.  No labored breathing.  Speech is clear and coherent with logical content.  Patient is alert and oriented at baseline.    Assessment and Plan: 1. Bleeding at insertion site (Primary) Call the orthopedic surgeon and go to the emergency room  Follow Up Instructions: I discussed the assessment and treatment plan with the patient.  The patient was provided an opportunity to ask questions and all were answered. The patient agreed with the plan and demonstrated an understanding of the instructions.  A copy of instructions were sent to the patient via MyChart unless otherwise noted below.    The patient was advised to call back or seek an in-person evaluation if the symptoms worsen or if the condition fails to improve as anticipated.    Averi Cacioppo W Ethal Gotay, NP

## 2023-07-21 ENCOUNTER — Encounter: Payer: Self-pay | Admitting: Emergency Medicine

## 2023-07-21 ENCOUNTER — Other Ambulatory Visit: Payer: Self-pay

## 2023-07-21 ENCOUNTER — Emergency Department
Admission: EM | Admit: 2023-07-21 | Discharge: 2023-07-21 | Disposition: A | Discharge status: Home / Self Care | Attending: Emergency Medicine | Admitting: Emergency Medicine

## 2023-07-21 DIAGNOSIS — M25561 Pain in right knee: Secondary | ICD-10-CM | POA: Insufficient documentation

## 2023-07-21 MED ORDER — KETOROLAC TROMETHAMINE 15 MG/ML IJ SOLN
15.0000 mg | Freq: Once | INTRAMUSCULAR | Status: AC
Start: 2023-07-21 — End: 2023-07-21
  Administered 2023-07-21: 15 mg via INTRAMUSCULAR
  Filled 2023-07-21: qty 1

## 2023-07-21 NOTE — ED Notes (Signed)
 Pt had right knee XR at [facility] on 07/14/23.

## 2023-07-21 NOTE — Discharge Instructions (Signed)
 Please follow-up with orthopedic department.  You may wear the brace during the day, please remove at night.  Please return for any new, worsening, or change in symptoms or other concerns.  It was a pleasure caring for you today.

## 2023-07-21 NOTE — ED Triage Notes (Signed)
 Pt here with right leg pain since the 17th. Pt states she had meniscus surgery on her left knee and has been putting weight on her right leg more. Pt states now her right leg is having a lot of pain, mostly in the back of her knee. Pt states the pain is constant but pt is still mobile.

## 2023-07-21 NOTE — ED Provider Notes (Signed)
 [facility] Provider Note    Event Date/Time   First MD Initiated Contact with Patient 07/21/23 1113     (approximate)   History   Leg Pain   HPI  Cassidy Mathis is a 48 y.o. female with a past medical history of meniscus injury to her left status post operative management on 05/27/2023 who presents today for evaluation of right knee pain.  Patient reports that she has been compensating for her left knee by walking differently on her right knee and this has increased her pain.  She reports that she has seen orthopedics twice for this complaints.  She notes that she has had popping and clicking to this knee.  She has not had any specific injury.  No numbness or tingling.  She is awaiting an MRI to be scheduled to orthopedics, is asking to have this done in the emergency department today.  Patient Active Problem List   Diagnosis Date Noted   Spondylosis without myelopathy or radiculopathy, lumbosacral region 09/06/2022   Lumbar facet joint pain 08/19/2022   Chronic low back pain (Bilateral) w/ sciatica (Bilateral) 12/29/2021   Lumbosacral radiculopathy at L5 (Bilateral) (L>R) 10/28/2021   Lumbosacral radiculopathy at S1 (Bilateral) (L>R) 10/28/2021   Weakness of lower extremity (Left) 10/28/2021   Chronic hip pain (Bilateral) (L>R) 10/28/2021   Greater trochanteric bursitis of hips (Bilateral) (R>L) 10/28/2021   DDD (degenerative disc disease), lumbosacral 10/28/2021   Chronic generalized pain 10/28/2021   Osteoarthritis of knee (Left) 10/28/2021   Elevated sed rate 04/27/2021   Vitamin D  deficiency 04/27/2021   Marijuana use 04/27/2021   Abnormal drug screen (12/29/2020) 04/27/2021   Chronic pain syndrome 12/29/2020   Pharmacologic therapy 12/29/2020   Disorder of skeletal system 12/29/2020   Problems influencing health status 12/29/2020   History of marijuana use 12/29/2020   Abnormal MRI, cervical spine (05/06/2019) 12/29/2020   Abnormal MRI, lumbar  spine (12/06/2021) 12/29/2020   GERD (gastroesophageal reflux disease) 12/29/2020   NSAID induced gastritis 12/29/2020   Chronic low back pain (1ry area of Pain) (Bilateral) (L>R) w/o sciatica 12/29/2020   Chronic lower extremity pain (2ry area of Pain) (Bilateral) (L>R) 12/29/2020   Chronic sacroiliac joint pain (Bilateral) (R>L) 12/29/2020   Lumbosacral facet syndrome (Bilateral) 12/29/2020   Lumbar facet hypertrophy (L4-5) (Bilateral) 12/29/2020   Lumbosacral facet arthropathy 12/29/2020   Somatic dysfunction of sacroiliac joints (Bilateral) 12/29/2020   Hypokalemia 12/11/2020   Acute blood loss anemia 12/07/2020   Vitamin B12 deficiency 12/06/2020   Rectal bleeding 12/04/2020   Precordial pain 11/28/2020   Palpitations 11/28/2020   Heart murmur 11/28/2020   Uncontrolled hypertension 11/28/2020   Right ovarian cyst    Acute bilateral low back pain with bilateral sciatica 06/01/2019   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) 06/01/2019   Cervical cancer, FIGO stage IB1 (HCC) 05/17/2017   Ileus (HCC) 04/18/2017   Postoperative anemia due to acute blood loss 04/18/2017   Tobacco use 04/06/2017   Malignant neoplasm of overlapping sites of cervix (HCC) 04/06/2017   Malignant neoplasm of exocervix (HCC)    Common bile duct stone    Pancreatitis 12/16/2016   Calculus of gallbladder with chronic cholecystitis without obstruction    Incarcerated epigastric hernia           Physical Exam   Triage Vital Signs: ED Triage Vitals [07/21/23 1059]  Encounter Vitals Group     BP (!) 154/105     Girls Systolic BP Percentile  Girls Diastolic BP Percentile      Boys Systolic BP Percentile      Boys Diastolic BP Percentile      Pulse Rate 79     Resp 17     Temp 98.6 F (37 C)     Temp Source Oral     SpO2 100 %     Weight 210 lb 1.6 oz (95.3 kg)     Height 5' 5 (1.651 m)     Head Circumference      Peak Flow      Pain Score 10     Pain Loc      Pain Education       Exclude from Growth Chart     Most recent vital signs: Vitals:   07/21/23 1059  BP: (!) 154/105  Pulse: 79  Resp: 17  Temp: 98.6 F (37 C)  SpO2: 100%    Physical Exam Vitals and nursing note reviewed.  Constitutional:      General: Awake and alert. No acute distress.    Appearance: Normal appearance. The patient is normal weight.  HENT:     Head: Normocephalic and atraumatic.     Mouth: Mucous membranes are moist.  Eyes:     General: PERRL. Normal EOMs        Right eye: No discharge.        Left eye: No discharge.     Conjunctiva/sclera: Conjunctivae normal.  Cardiovascular:     Rate and Rhythm: Normal rate and regular rhythm.     Pulses: Normal pulses.  Pulmonary:     Effort: Pulmonary effort is normal. No respiratory distress.     Breath sounds: Normal breath sounds.  Abdominal:     Abdomen is soft. There is no abdominal tenderness. No rebound or guarding. No distention. Musculoskeletal:        General: No swelling. Normal range of motion.     Cervical back: Normal range of motion and neck supple.  Right knee: No deformity or rash.  Mild medial and lateral joint line tenderness. No patellar tenderness, no ballotment Warm and well perfused extremity with 2+ pedal pulses 5/5 strength to dorsiflexion and plantarflexion at the ankle with intact sensation throughout extremity Normal range of motion of the knee, with intact flexion and extension to active and passive range of motion. Extensor mechanism intact. No ligamentous laxity. Negative anterior/posterior drawer/negative lachman, positive mcmurrays No effusion or warmth Intact quadriceps, hamstring function, patellar tendon function Pelvis stable Full ROM of ankle without pain or swelling Foot warm and well perfused Skin:    General: Skin is warm and dry.     Capillary Refill: Capillary refill takes less than 2 seconds.     Findings: No rash.  Neurological:     Mental Status: The patient is awake and alert.       ED Results / Procedures / Treatments   Labs (all labs ordered are listed, but only abnormal results are displayed) Labs Reviewed - No data to display   EKG     RADIOLOGY     PROCEDURES:  Critical Care performed:   Procedures   MEDICATIONS ORDERED IN ED: Medications  ketorolac  (TORADOL ) 15 MG/ML injection 15 mg (15 mg Intramuscular Given 07/21/23 1137)     IMPRESSION / MDM / ASSESSMENT AND PLAN / ED COURSE  I reviewed the triage vital signs and the nursing notes.   Differential diagnosis includes, but is not limited to, meniscus injury, ligamental strain, less likely fracture  or dislocation.  I reviewed the patient's chart.  Patient had a left knee bucket-handle lateral meniscus tear for which she had a partial lateral meniscectomy on 05/27/2023 with Dr. Earnestine Blanch.  She has been undergoing physical therapy.  She had a follow-up appointment on 07/14/2023 at which time she reported persistent right knee pain.  She was also seen by Dr. Blanch on 07/06/2023 given right knee pain while trying to compensate for her left knee surgery.  She had a cortisone injection on 07/06/2023.  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  They started her on Celebrex 200 mg once daily and was also prescribed hydrocodone .  X-ray of her right knee reveals very minimal lateral femoral osteophyte formation, no chondral defect or lesion, no joint line abnormality.   Patient denies any new injuries and does not wish to have another x-ray which was offered for her today.  Advised that she needs to wait for her outpatient MRI as I am unable to order this for her today given that it is nonemergent.  There are no findings suggestive of a septic arthritis, she is able to range her knee fully, there is no warmth, erythema, or effusion noted.  No constitutional symptoms.  No trauma to suggest fracture or dislocation.  I suspect possible meniscus injury  versus ligamental strain.  I advised that she should continue to await her outpatient MRI and follow-up with orthopedics as she has scheduled, as well as rest, ice, elevate her leg.  She was given a knee brace to use during the day, remove at night.  We discussed return precautions and the importance of close outpatient follow-up.  Patient understands and agrees with plan.  She was discharged in stable condition.  Patient's presentation is most consistent with acute complicated illness / injury requiring diagnostic workup.   FINAL CLINICAL IMPRESSION(S) / ED DIAGNOSES   Final diagnoses:  Acute pain of right knee     Rx / DC Orders   ED Discharge Orders     None        Note:  This document was prepared using Dragon voice recognition software and may include unintentional dictation errors.   Keanen Dohse E, PA-C 07/21/23 1354    Levander Slate, MD 07/22/23 [phone]

## 2023-07-26 ENCOUNTER — Other Ambulatory Visit: Payer: Self-pay | Admitting: Orthopedic Surgery

## 2023-07-26 DIAGNOSIS — G8929 Other chronic pain: Secondary | ICD-10-CM

## 2023-07-26 DIAGNOSIS — M2351 Chronic instability of knee, right knee: Secondary | ICD-10-CM

## 2023-07-26 DIAGNOSIS — M2391 Unspecified internal derangement of right knee: Secondary | ICD-10-CM

## 2023-07-30 ENCOUNTER — Ambulatory Visit
Admission: RE | Admit: 2023-07-30 | Discharge: 2023-07-30 | Disposition: A | Discharge status: Home / Self Care | Source: Ambulatory Visit | Attending: Orthopedic Surgery | Admitting: Orthopedic Surgery

## 2023-07-30 DIAGNOSIS — M2391 Unspecified internal derangement of right knee: Secondary | ICD-10-CM

## 2023-07-30 DIAGNOSIS — M2351 Chronic instability of knee, right knee: Secondary | ICD-10-CM

## 2023-07-30 DIAGNOSIS — G8929 Other chronic pain: Secondary | ICD-10-CM

## 2023-08-02 ENCOUNTER — Other Ambulatory Visit: Payer: Self-pay | Admitting: Orthopedic Surgery

## 2023-08-02 DIAGNOSIS — G8929 Other chronic pain: Secondary | ICD-10-CM

## 2023-08-02 DIAGNOSIS — M2391 Unspecified internal derangement of right knee: Secondary | ICD-10-CM

## 2023-08-02 DIAGNOSIS — M2351 Chronic instability of knee, right knee: Secondary | ICD-10-CM

## 2023-08-19 ENCOUNTER — Encounter: Payer: Self-pay | Admitting: Orthopedic Surgery

## 2023-08-19 ENCOUNTER — Other Ambulatory Visit: Payer: Self-pay | Admitting: Orthopedic Surgery

## 2023-08-22 NOTE — Anesthesia Preprocedure Evaluation (Addendum)
 Anesthesia Evaluation  Patient identified by MRN, date of birth, ID band Patient awake    Reviewed: Allergy & Precautions, H&P , NPO status , Patient's Chart, lab work & pertinent test results  Airway Mallampati: II  TM Distance: >3 FB     Dental   Pulmonary asthma , pneumonia, Current Smoker and Patient abstained from smoking.          Cardiovascular hypertension, + Valvular Problems/Murmurs      Neuro/Psych  Headaches PSYCHIATRIC DISORDERS Anxiety Depression     Neuromuscular disease    GI/Hepatic Neg liver ROS,GERD  ,,  Endo/Other  negative endocrine ROS    Renal/GU negative Renal ROS  negative genitourinary   Musculoskeletal  (+) Arthritis ,    Abdominal   Peds negative pediatric ROS (+)  Hematology  (+) Blood dyscrasia, anemia   Anesthesia Other Findings NOTE: claustrophobia/anxiety/depression 05-27-23 had left knee menisectomy with Dr. Ola anesthesiologist  Anxiety--administered Versed  IV preop last procedure, and will administer preop versed  IV per patient request, states she is very nervous  Patient states my anxiety is so bad  Depression  Allergy Headache  Pilonidal cyst Hypertension  Pancreatitis GERD (gastroesophageal reflux disease) preop states GERD not often,  Asthma Pneumonia  Cancer (HCC) Anemia  Sciatica Claustrophobia  Anxiety Arthritis     Reproductive/Obstetrics negative OB ROS                              Anesthesia Physical Anesthesia Plan  ASA: 2  Anesthesia Plan: General   Post-op Pain Management:    Induction: Intravenous  PONV Risk Score and Plan:   Airway Management Planned: LMA  Additional Equipment:   Intra-op Plan:   Post-operative Plan: Extubation in OR  Informed Consent: I have reviewed the patients History and Physical, chart, labs and discussed the procedure including the risks, benefits and alternatives for the proposed  anesthesia with the patient or authorized representative who has indicated his/her understanding and acceptance.     Dental Advisory Given  Plan Discussed with: Anesthesiologist, CRNA and Surgeon  Anesthesia Plan Comments: (Patient consented for risks of anesthesia including but not limited to:  - adverse reactions to medications - damage to eyes, teeth, lips or other oral mucosa - nerve damage due to positioning  - sore throat or hoarseness - Damage to heart, brain, nerves, lungs, other parts of body or loss of life  Patient voiced understanding and assent.)         Anesthesia Quick Evaluation

## 2023-08-24 ENCOUNTER — Other Ambulatory Visit: Payer: Self-pay | Admitting: Orthopedic Surgery

## 2023-09-02 HISTORY — DX: Unspecified osteoarthritis, unspecified site: M19.90

## 2023-09-09 ENCOUNTER — Emergency Department
Admission: EM | Admit: 2023-09-09 | Discharge: 2023-09-09 | Disposition: A | Discharge status: Home / Self Care | Attending: Emergency Medicine | Admitting: Emergency Medicine

## 2023-09-09 ENCOUNTER — Other Ambulatory Visit: Payer: Self-pay

## 2023-09-09 ENCOUNTER — Ambulatory Visit

## 2023-09-09 ENCOUNTER — Encounter: Payer: Self-pay | Admitting: Orthopedic Surgery

## 2023-09-09 ENCOUNTER — Encounter
Admission: RE | Disposition: A | Discharge status: Other Acute Inpatient Hospital | Payer: Self-pay | Source: Home / Self Care | Attending: Orthopedic Surgery

## 2023-09-09 ENCOUNTER — Ambulatory Visit
Admission: RE | Admit: 2023-09-09 | Discharge: 2023-09-09 | Disposition: A | Discharge status: Other Acute Inpatient Hospital | Attending: Orthopedic Surgery | Admitting: Orthopedic Surgery

## 2023-09-09 ENCOUNTER — Other Ambulatory Visit
Admission: RE | Admit: 2023-09-09 | Discharge: 2023-09-09 | Disposition: A | Discharge status: Home / Self Care | Attending: Anesthesiology | Admitting: Anesthesiology

## 2023-09-09 ENCOUNTER — Ambulatory Visit: Payer: Self-pay | Admitting: Anesthesiology

## 2023-09-09 ENCOUNTER — Emergency Department

## 2023-09-09 DIAGNOSIS — M2341 Loose body in knee, right knee: Secondary | ICD-10-CM | POA: Diagnosis not present

## 2023-09-09 DIAGNOSIS — I10 Essential (primary) hypertension: Secondary | ICD-10-CM | POA: Insufficient documentation

## 2023-09-09 DIAGNOSIS — J45909 Unspecified asthma, uncomplicated: Secondary | ICD-10-CM | POA: Insufficient documentation

## 2023-09-09 DIAGNOSIS — S83281A Other tear of lateral meniscus, current injury, right knee, initial encounter: Secondary | ICD-10-CM | POA: Diagnosis present

## 2023-09-09 DIAGNOSIS — W000XXA Fall on same level due to ice and snow, initial encounter: Secondary | ICD-10-CM | POA: Insufficient documentation

## 2023-09-09 DIAGNOSIS — S83241A Other tear of medial meniscus, current injury, right knee, initial encounter: Secondary | ICD-10-CM | POA: Insufficient documentation

## 2023-09-09 DIAGNOSIS — M1711 Unilateral primary osteoarthritis, right knee: Secondary | ICD-10-CM | POA: Insufficient documentation

## 2023-09-09 DIAGNOSIS — R0789 Other chest pain: Secondary | ICD-10-CM | POA: Diagnosis present

## 2023-09-09 DIAGNOSIS — R079 Chest pain, unspecified: Secondary | ICD-10-CM

## 2023-09-09 DIAGNOSIS — F17210 Nicotine dependence, cigarettes, uncomplicated: Secondary | ICD-10-CM | POA: Diagnosis not present

## 2023-09-09 DIAGNOSIS — G709 Myoneural disorder, unspecified: Secondary | ICD-10-CM | POA: Insufficient documentation

## 2023-09-09 DIAGNOSIS — M25561 Pain in right knee: Secondary | ICD-10-CM | POA: Insufficient documentation

## 2023-09-09 DIAGNOSIS — K219 Gastro-esophageal reflux disease without esophagitis: Secondary | ICD-10-CM | POA: Diagnosis not present

## 2023-09-09 HISTORY — DX: Unspecified osteoarthritis, unspecified site: M19.90

## 2023-09-09 HISTORY — PX: KNEE ARTHROSCOPY WITH LATERAL MENISECTOMY: SHX6193

## 2023-09-09 LAB — COMPREHENSIVE METABOLIC PANEL WITH GFR
ALT: 121 U/L — ABNORMAL HIGH (ref 0–44)
AST: 244 U/L — ABNORMAL HIGH (ref 15–41)
Albumin: 3.6 g/dL (ref 3.5–5.0)
Alkaline Phosphatase: 99 U/L (ref 38–126)
Anion gap: 10 (ref 5–15)
BUN: 12 mg/dL (ref 6–20)
CO2: 22 mmol/L (ref 22–32)
Calcium: 8.7 mg/dL — ABNORMAL LOW (ref 8.9–10.3)
Chloride: 108 mmol/L (ref 98–111)
Creatinine, Ser: 0.84 mg/dL (ref 0.44–1.00)
GFR, Estimated: >60 mL/min (ref >60)
Glucose, Bld: 119 mg/dL — ABNORMAL HIGH (ref 70–99)
Potassium: 3.4 mmol/L — ABNORMAL LOW (ref 3.5–5.1)
Sodium: 140 mmol/L (ref 135–145)
Total Bilirubin: 0.8 mg/dL (ref 0.0–1.2)
Total Protein: 7.3 g/dL (ref 6.5–8.1)

## 2023-09-09 LAB — BRAIN NATRIURETIC PEPTIDE: B Natriuretic Peptide: 8.3 pg/mL (ref 0.0–100.0)

## 2023-09-09 LAB — BASIC METABOLIC PANEL WITH GFR
Anion gap: 10 (ref 5–15)
BUN: 15 mg/dL (ref 6–20)
CO2: 22 mmol/L (ref 22–32)
Calcium: 8.6 mg/dL — ABNORMAL LOW (ref 8.9–10.3)
Chloride: 106 mmol/L (ref 98–111)
Creatinine, Ser: 1.1 mg/dL — ABNORMAL HIGH (ref 0.44–1.00)
GFR, Estimated: >60 mL/min (ref >60)
Glucose, Bld: 101 mg/dL — ABNORMAL HIGH (ref 70–99)
Potassium: 3.3 mmol/L — ABNORMAL LOW (ref 3.5–5.1)
Sodium: 138 mmol/L (ref 135–145)

## 2023-09-09 LAB — CBC
HCT: 36.1 % (ref 36.0–46.0)
Hemoglobin: 12 g/dL (ref 12.0–15.0)
MCH: 29.5 pg (ref 26.0–34.0)
MCHC: 33.2 g/dL (ref 30.0–36.0)
MCV: 88.7 fL (ref 80.0–100.0)
Platelets: 374 K/uL (ref 150–400)
RBC: 4.07 MIL/uL (ref 3.87–5.11)
RDW: 13.2 % (ref 11.5–15.5)
WBC: 11.7 K/uL — ABNORMAL HIGH (ref 4.0–10.5)
nRBC: 0 % (ref 0.0–0.2)

## 2023-09-09 LAB — TROPONIN I (HIGH SENSITIVITY)
Troponin I (High Sensitivity): 9 ng/L (ref <18)
Troponin I (High Sensitivity): 2 ng/L (ref <18)
Troponin I (High Sensitivity): 3 ng/L (ref <18)

## 2023-09-09 SURGERY — ARTHROSCOPY, KNEE, WITH LATERAL MENISCECTOMY
Anesthesia: General | Site: Knee | Laterality: Right

## 2023-09-09 MED ORDER — EPHEDRINE 5 MG/ML INJ
INTRAVENOUS | Status: AC
  Filled 2023-09-09: qty 5

## 2023-09-09 MED ORDER — CEFAZOLIN SODIUM-DEXTROSE 2-3 GM-%(50ML) IV SOLR
INTRAVENOUS | Status: AC
  Filled 2023-09-09: qty 50

## 2023-09-09 MED ORDER — HYDROCODONE-ACETAMINOPHEN 5-325 MG PO TABS: 1.0000 | ORAL_TABLET | ORAL | 0 refills | Status: DC | PRN

## 2023-09-09 MED ORDER — ONDANSETRON HCL 4 MG/2ML IJ SOLN
INTRAMUSCULAR | Status: AC
  Filled 2023-09-09: qty 2

## 2023-09-09 MED ORDER — MORPHINE SULFATE (PF) 4 MG/ML IV SOLN
4.0000 mg | Freq: Once | INTRAVENOUS | Status: AC
  Administered 2023-09-09: 4 mg via INTRAVENOUS
  Filled 2023-09-09: qty 1

## 2023-09-09 MED ORDER — ONDANSETRON 4 MG PO TBDP: 4.0000 mg | ORAL_TABLET | Freq: Three times a day (TID) | ORAL | 0 refills | Status: DC | PRN

## 2023-09-09 MED ORDER — ONDANSETRON HCL 4 MG/2ML IJ SOLN
4.0000 mg | Freq: Once | INTRAMUSCULAR | Status: AC
  Administered 2023-09-09: 4 mg via INTRAVENOUS
  Filled 2023-09-09: qty 2

## 2023-09-09 MED ORDER — PHENYLEPHRINE HCL (PRESSORS) 10 MG/ML IV SOLN
INTRAVENOUS | Status: DC | PRN
  Administered 2023-09-09: 160 ug via INTRAVENOUS

## 2023-09-09 MED ORDER — LIDOCAINE HCL (PF) 2 % IJ SOLN
INTRAMUSCULAR | Status: AC
Start: 2023-09-09 — End: 2023-09-09
  Filled 2023-09-09: qty 5

## 2023-09-09 MED ORDER — DEXAMETHASONE SODIUM PHOSPHATE 4 MG/ML IJ SOLN
INTRAMUSCULAR | Status: DC | PRN
  Administered 2023-09-09: 4 mg via INTRAVENOUS

## 2023-09-09 MED ORDER — MIDAZOLAM HCL 2 MG/2ML IJ SOLN
2.0000 mg | Freq: Once | INTRAMUSCULAR | Status: AC
  Administered 2023-09-09: 2 mg via INTRAVENOUS

## 2023-09-09 MED ORDER — MIDAZOLAM HCL 2 MG/2ML IJ SOLN
INTRAMUSCULAR | Status: AC
  Filled 2023-09-09: qty 2

## 2023-09-09 MED ORDER — ASPIRIN 325 MG PO TBEC: 325.0000 mg | DELAYED_RELEASE_TABLET | Freq: Every day | ORAL | 0 refills | Status: AC

## 2023-09-09 MED ORDER — MIDAZOLAM HCL 5 MG/5ML IJ SOLN
INTRAMUSCULAR | Status: DC | PRN
  Administered 2023-09-09: 2 mg via INTRAVENOUS

## 2023-09-09 MED ORDER — LIDOCAINE-EPINEPHRINE 1 %-1:100000 IJ SOLN
INTRAMUSCULAR | Status: DC | PRN
  Administered 2023-09-09: 14 mL

## 2023-09-09 MED ORDER — LACTATED RINGERS IV SOLN: INTRAVENOUS | Status: DC

## 2023-09-09 MED ORDER — FENTANYL CITRATE (PF) 100 MCG/2ML IJ SOLN
INTRAMUSCULAR | Status: AC
  Filled 2023-09-09: qty 2

## 2023-09-09 MED ORDER — CEFAZOLIN SODIUM-DEXTROSE 2-4 GM/100ML-% IV SOLN
2.0000 g | INTRAVENOUS | Status: AC
  Administered 2023-09-09: 2 g via INTRAVENOUS

## 2023-09-09 MED ORDER — EPHEDRINE SULFATE (PRESSORS) 50 MG/ML IJ SOLN
INTRAMUSCULAR | Status: DC | PRN
Start: 2023-09-09 — End: 2023-09-09
  Administered 2023-09-09 (×2): 10 mg via INTRAVENOUS
  Administered 2023-09-09: 5 mg via INTRAVENOUS

## 2023-09-09 MED ORDER — PROPOFOL 10 MG/ML IV BOLUS
INTRAVENOUS | Status: DC | PRN
  Administered 2023-09-09: 200 mg via INTRAVENOUS

## 2023-09-09 MED ORDER — FAMOTIDINE IN NACL 20-0.9 MG/50ML-% IV SOLN
20.0000 mg | Freq: Two times a day (BID) | INTRAVENOUS | Status: DC
  Administered 2023-09-09: 20 mg via INTRAVENOUS

## 2023-09-09 MED ORDER — FENTANYL CITRATE (PF) 100 MCG/2ML IJ SOLN
INTRAMUSCULAR | Status: DC | PRN
  Administered 2023-09-09: 50 ug via INTRAVENOUS
  Administered 2023-09-09 (×2): 25 ug via INTRAVENOUS

## 2023-09-09 MED ORDER — PROPOFOL 10 MG/ML IV BOLUS
INTRAVENOUS | Status: AC
  Filled 2023-09-09: qty 20

## 2023-09-09 MED ORDER — LACTATED RINGERS IR SOLN
Status: DC | PRN
  Administered 2023-09-09: 5000 mL

## 2023-09-09 MED ORDER — DEXAMETHASONE SODIUM PHOSPHATE 4 MG/ML IJ SOLN
INTRAMUSCULAR | Status: AC
  Filled 2023-09-09: qty 1

## 2023-09-09 MED ORDER — ACETAMINOPHEN 500 MG PO TABS: 1000.0000 mg | ORAL_TABLET | Freq: Three times a day (TID) | ORAL | 2 refills | Status: AC

## 2023-09-09 MED ORDER — FAMOTIDINE 200 MG/20ML IV SOLN
INTRAVENOUS | Status: AC
  Filled 2023-09-09: qty 1

## 2023-09-09 MED ORDER — IBUPROFEN 800 MG PO TABS: 800.0000 mg | ORAL_TABLET | Freq: Three times a day (TID) | ORAL | 0 refills | Status: AC

## 2023-09-09 MED ORDER — ONDANSETRON HCL 4 MG/2ML IJ SOLN
INTRAMUSCULAR | Status: DC | PRN
  Administered 2023-09-09: 4 mg via INTRAVENOUS

## 2023-09-09 MED ORDER — LIDOCAINE HCL (CARDIAC) PF 100 MG/5ML IV SOSY
PREFILLED_SYRINGE | INTRAVENOUS | Status: DC | PRN
  Administered 2023-09-09: 100 mg via INTRATRACHEAL

## 2023-09-09 SURGICAL SUPPLY — 34 items
BLADE FULL RADIUS 3.5 (BLADE) IMPLANT
BLADE SHAVER 4.5X7 STR FR (MISCELLANEOUS) IMPLANT
BLADE SURG 15 STRL LF DISP TIS (BLADE) IMPLANT
BLADE SURG SZ11 CARB STEEL (BLADE) ×2 IMPLANT
BNDG COHESIVE 4X5 WHT NS (GAUZE/BANDAGES/DRESSINGS) ×2 IMPLANT
BNDG ESMARK 6X12 TAN STRL LF (GAUZE/BANDAGES/DRESSINGS) IMPLANT
CHLORAPREP W/TINT 26 (MISCELLANEOUS) ×2 IMPLANT
COOLER POLAR GLACIER W/PUMP (MISCELLANEOUS) ×2 IMPLANT
COVER LIGHT HANDLE UNIVERSAL (MISCELLANEOUS) ×4 IMPLANT
CUFF TRNQT CYL 30X4X21-28X (TOURNIQUET CUFF) IMPLANT
CUFF TRNQT CYL 34X4X40X1 (TOURNIQUET CUFF) IMPLANT
DRAPE EXTREMITY T 121X128X90 (DISPOSABLE) ×2 IMPLANT
DRAPE IMP U-DRAPE 54X76 (DRAPES) ×2 IMPLANT
DRAPE U-SHAPE 48X52 POLY STRL (PACKS) ×2 IMPLANT
GAUZE SPONGE 4X4 12PLY STRL (GAUZE/BANDAGES/DRESSINGS) ×2 IMPLANT
GLOVE SRG 8 PF TXTR STRL LF DI (GLOVE) ×2 IMPLANT
GLOVE SURG ENC MOIS LTX SZ7.5 (GLOVE) ×2 IMPLANT
GOWN STRL REIN 2XL XLG LVL4 (GOWN DISPOSABLE) ×4 IMPLANT
GOWN STRL REUS W/ TWL LRG LVL3 (GOWN DISPOSABLE) ×2 IMPLANT
IV LR IRRIG 3000ML ARTHROMATIC (IV SOLUTION) ×8 IMPLANT
KIT TURNOVER KIT A (KITS) ×2 IMPLANT
MANIFOLD 4PT FOR NEPTUNE1 (MISCELLANEOUS) ×2 IMPLANT
MAT ABSORB FLUID 56X50 GRAY (MISCELLANEOUS) ×2 IMPLANT
PACK ARTHROSCOPY KNEE (MISCELLANEOUS) ×2 IMPLANT
PAD ABD DERMACEA PRESS 5X9 (GAUZE/BANDAGES/DRESSINGS) ×4 IMPLANT
PAD WRAPON POLAR KNEE (MISCELLANEOUS) ×2 IMPLANT
PADDING CAST BLEND 6X4 STRL (MISCELLANEOUS) IMPLANT
SET Y ADAPTER MULIT-BAG IRRIG (MISCELLANEOUS) ×4 IMPLANT
STRIP CLOSURE SKIN 1/2X4 (GAUZE/BANDAGES/DRESSINGS) IMPLANT
SUTURE EHLN 3-0 FS-10 30 BLK (SUTURE) ×2 IMPLANT
TOWEL OR 17X26 4PK STRL BLUE (TOWEL DISPOSABLE) ×4 IMPLANT
TUBING INFLOW SET DBFLO PUMP (TUBING) ×2 IMPLANT
TUBING OUTFLOW SET DBLFO PUMP (TUBING) ×2 IMPLANT
WAND WEREWOLF FLOW 90D (MISCELLANEOUS) ×2 IMPLANT

## 2023-09-09 NOTE — Discharge Instructions (Signed)
 Arthroscopic Knee Surgery - Partial Meniscectomy   Post-Op Instructions   1. Bracing or crutches: Crutches will be provided at the time of discharge from the surgery center if you do not already have them.   2. Ice: You may be provided with a device [facility]) that allows you to ice the affected area effectively. Otherwise you can ice manually.    3. Driving:  Plan on not driving for at least two weeks. Please note that you are advised NOT to drive while taking narcotic pain medications as you may be impaired and unsafe to drive.   4. Activity: Ankle pumps several times an hour while awake to prevent blood clots. Weight bearing: as tolerated. Use crutches for as needed (usually ~1 week or less) until pain allows you to ambulate without a limp. Bending and straightening the knee is unlimited. Elevate knee above heart level as much as possible for one week. Avoid standing more than 5 minutes (consecutively) for the first week.  Avoid long distance travel for 2 weeks.  5. Medications:  - You have been provided a prescription for narcotic pain medicine. After surgery, take 1-2 narcotic tablets every 4 hours if needed for severe pain.  - You may take up to 3000mg /day of tylenol  (acetaminophen ). You can take 1000mg  3x/day. Please check your narcotic. If you have acetaminophen  in your narcotic (each tablet will be 325mg ), be careful not to exceed a total of 3000mg /day of acetaminophen .  - A prescription for anti-nausea medication will be provided in case the narcotic medicine or anesthesia causes nausea - take 1 tablet every 6 hours only if nauseated.  - Take ibuprofen  800 mg every 8 hours WITH food to reduce post-operative knee swelling. DO NOT STOP IBUPROFEN  POST-OP UNTIL INSTRUCTED TO DO SO at first post-op office visit (10-14 days after surgery). However, please discontinue if you have any abdominal discomfort after taking this.  - Take enteric coated aspirin  325 mg once daily for 2 weeks to prevent  blood clots.    6. Bandages: The physical therapist should change the bandages at the first post-op appointment. If needed, the dressing supplies have been provided to you.   7. Physical Therapy: 1-2 times per week for 6 weeks. Therapy typically starts on post operative Day 3 or 4. You have been provided an order for physical therapy. The therapist will provide home exercises.   8. Work: May do light duty/desk job in approximately 1-2 weeks when off of narcotics, pain is well-controlled, and swelling has decreased. Labor intensive jobs may require 4-6 weeks to return.      9. Post-Op Appointments: Your first post-op appointment will be with Dr. Tobie in approximately 2 weeks time.    If you find that they have not been scheduled please call the Orthopaedic Appointment front desk at [phone].

## 2023-09-09 NOTE — Progress Notes (Signed)
 I tried to write a note, but for unknown reason, Epic would not let me finish the note, so am writing this addendum.  Patient states OK to talk with her sister, so discussed w/patient and sister, this may be cardiac, it may be GI, but I can't safely differentiate, and in light of symptoms now and previously, will notify EMS. Discussed w/Dr. Raynell at [facility] and will plan to send her there via ambulance. EMS here to transport patient.

## 2023-09-09 NOTE — Transfer of Care (Signed)
 Immediate Anesthesia Transfer of Care Note  Patient: Briseyda L Meno  Procedure(s) Performed: ARTHROSCOPY, KNEE, WITH LATERAL MENISCECTOMY (Right: Knee)  Patient Location: PACU  Anesthesia Type: General  Level of Consciousness: awake, alert  and patient cooperative  Airway and Oxygen Therapy: Patient Spontanous Breathing and Patient connected to supplemental oxygen  Post-op Assessment: Post-op Vital signs reviewed, Patient's Cardiovascular Status Stable, Respiratory Function Stable, Patent Airway and No signs of Nausea or vomiting  Post-op Vital Signs: Reviewed and stable  Complications: No notable events documented.

## 2023-09-09 NOTE — H&P (Signed)
 Paper H&P to be scanned into permanent record. H&P reviewed. No significant changes noted. Reg rate and rhythm, chest sounds clear

## 2023-09-09 NOTE — Progress Notes (Signed)
 Report given to EMS. Pt VSS. Patient transported to [facility].

## 2023-09-09 NOTE — ED Provider Notes (Signed)
 [facility] Provider Note    Event Date/Time   First MD Initiated Contact with Patient 09/09/23 1046     (approximate)  History   Chief Complaint: Chest pain  HPI  Cassidy Mathis is a 48 y.o. female with a past medical history of anxiety, migraines, hypertension, presents to the emergency department for chest pain.  According to the patient and record review patient just finished a right knee arthroscopy for meniscal repair around 9:00 this morning.  While in postop care patient developed chest pain and was sent from the surgical center to the emergency department for further evaluation.  In speaking to the patient more in-depth she states she has been experiencing similar chest pains for years, states she has had them worked up in the past without any findings.  States she used to see cardiologist but has not seen one recently.  Denies any prior MI or stents.  Patient currently rates her chest pain as a 6/10 but also states significant right knee pain.  No shortness of breath.    Physical Exam   Triage Vital Signs: ED Triage Vitals [09/09/23 1050]  Encounter Vitals Group     BP 106/83     Girls Systolic BP Percentile      Girls Diastolic BP Percentile      Boys Systolic BP Percentile      Boys Diastolic BP Percentile      Pulse Rate 80     Resp 18     Temp      Temp src      SpO2 100 %     Weight      Height      Head Circumference      Peak Flow      Pain Score      Pain Loc      Pain Education      Exclude from Growth Chart     Most recent vital signs: Vitals:   09/09/23 1050  BP: 106/83  Pulse: 80  Resp: 18  SpO2: 100%    General: Awake, no distress.  CV:  Good peripheral perfusion.  Regular rate and rhythm  Resp:  Normal effort.  Equal breath sounds bilaterally.  No chest wall tenderness. Abd:  No distention.  Soft, nontender.  No rebound or guarding. Other:  Knee brace on the right knee.   ED Results / Procedures / Treatments    EKG  EKG viewed and interpreted by myself shows a normal sinus rhythm at 65 bpm with a narrow QRS, normal axis, normal intervals, no concerning ST changes.  RADIOLOGY  I have reviewed interpret the chest x-ray images.  No consolidation on my evaluation. Radiology is read the x-ray is negative   MEDICATIONS ORDERED IN ED: Medications  morphine  (PF) 4 MG/ML injection 4 mg (has no administration in time range)  ondansetron  (ZOFRAN ) injection 4 mg (has no administration in time range)     IMPRESSION / MDM / ASSESSMENT AND PLAN / ED COURSE  I reviewed the triage vital signs and the nursing notes.  Patient's presentation is most consistent with acute presentation with potential threat to life or bodily function.  Patient presents to the emergency department from the day surgical center for chest pain.  Patient developed chest pain shortly after her knee arthroscopy.  Patient states a history of similar chest pains in the past have been ongoing for years but states that has been several months up to a year  since she has had chest pain this bad.  Patient states she has been seen in the emergency department previously for chest pain that has not shown any findings.  Used to see a cardiologist per patient however states she has not been seeing one recently.  We will check labs including CBC chemistry troponin x 2, chest x-ray and obtain an EKG.  I reviewed the EKG from the surgical center which showed no acute finding.  Patient's workup shows a reassuring CBC reassuring chemistry and a negative troponin.  Given her onset of symptoms shortly prior to arrival we will repeat a troponin as a precaution.  If the patient's repeat troponin remains negative I believe the patient can be safely discharged home.  Patient's repeat troponin remains negative.  We will discharge the patient home with outpatient follow-up.  FINAL CLINICAL IMPRESSION(S) / ED DIAGNOSES   Chest pain   Note:  This document  was prepared using Dragon voice recognition software and may include unintentional dictation errors.   Dorothyann Drivers, MD 09/09/23 [phone]

## 2023-09-09 NOTE — Anesthesia Procedure Notes (Signed)
 Procedure Name: LMA Insertion Date/Time: 09/09/2023 7:48 AM  Performed by: Veronica Alm BROCKS, CRNAPre-anesthesia Checklist: Patient identified, Emergency Drugs available, Suction available and Patient being monitored Patient Re-evaluated:Patient Re-evaluated prior to induction Oxygen Delivery Method: Circle system utilized Preoxygenation: Pre-oxygenation with 100% oxygen Induction Type: IV induction Ventilation: Mask ventilation without difficulty LMA Size: 4.0 Number of attempts: 1 Placement Confirmation: positive ETCO2, CO2 detector and breath sounds checked- equal and bilateral Tube secured with: Tape

## 2023-09-09 NOTE — Anesthesia Postprocedure Evaluation (Signed)
 Anesthesia Post Note  Patient: Cassidy Mathis  Procedure(s) Performed: ARTHROSCOPY, KNEE, WITH LATERAL MENISCECTOMY (Right: Knee)  Anesthesia Type: General Anesthetic complications: no Comments: Postop, patient reported chest pain, pressure, feels like someone sitting on my chest.  Denies dyspnea. Skin warm and dry.  Denies nausea. Vitals stable, EKG NSR.  However, on questioning, she states that at home, this sometimes happens with activity, and that there have been occasions when she did have nausea or a cold sweat, when I asked her.  States she takes a motrin  or tylenol  and I lie down flat and rest, and then it usually goes away.  Uncertain of length of time for this. Quit smoking cigarettes, but does smoke black and mild, and sometimes marijuana.   Does have GERD.  Do not have GI cocktail in ASC. Did administer Pepcid  20 mg IV.  Obtained EKG, labs, pCXR.    No notable events documented.   Last Vitals:  Vitals:   09/09/23 0945 09/09/23 0950  BP: 121/71   Pulse: 72 65  Resp: 15 16  Temp:    SpO2: 100% 100%    Last Pain:  Vitals:   09/09/23 0900  TempSrc:   PainSc: 9                  Jillann Charette C Zaheer Wageman

## 2023-09-09 NOTE — Progress Notes (Signed)
 Pt grabbing chest and wincing. Pt ask RN to lay her flat because her chest is hurting. Pt reports 9/10 chest pain. Pt states it feels like somebody is sitting on my chest, like a lot of pressure. Pt states she gets this feeling at home sometimes and she lays flat on her stomach and it gets better. Pt repositioned and the pain remained the same. Pt has no cardiac history but reports having history of acid reflux. Anesthesia made aware. EKG, chest Xray, and labs ordered. Vital signs stable. Will continue to monitor.

## 2023-09-09 NOTE — Op Note (Signed)
 Operative Note    SURGERY DATE: 09/09/2023   PRE-OP DIAGNOSIS:  1. 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 Right tricompartmental degenerative changes   POST-OP DIAGNOSIS:  1. Right medial meniscus tear 2. 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 Right tricompartmental degenerative changes 4.  Right knee multiple loose bodies   PROCEDURES:  1. Right knee arthroscopy, partial medial and lateral meniscectomy 2. Right knee tricompartmental chondroplasty 3.  Right knee arthroscopic loose body removal   SURGEON: Earnestine HILARIO Blanch, MD   ANESTHESIA: Gen   ESTIMATED BLOOD LOSS: minimal   TOTAL IV FLUIDS: per anesthesia   INDICATION(S):  Cassidy Mathis is a 48 y.o. female with signs and symptoms as well as MRI finding of lateral meniscus tear with possible bucket handle tear with discoid variant. Of note, she had similar findings on the contralateral side.  After discussion of risks, benefits, and alternatives to surgery, the patient elected to proceed.   OPERATIVE FINDINGS:    Examination under anesthesia: A careful examination under anesthesia was performed.  Passive range of motion was: Hyperextension: 2.  Extension: 0.  Flexion: 130.  Lachman: normal. Pivot Shift: normal.  Posterior drawer: normal.  Varus stability in full extension: normal.  Varus stability in 30 degrees of flexion: normal.  Valgus stability in full extension: normal.  Valgus stability in 30 degrees of flexion: normal.   Intra-operative findings: A thorough arthroscopic examination of the knee was performed.  The findings are: 1. Suprapatellar pouch: Normal, except for multiple cartilaginous loose bodies 2. Undersurface of median ridge: Grade 1 softening 3. Medial patellar facet: Grade 1 softening 4. Lateral patellar facet: Focal area of grade 2-3 degenerative changes 5. Trochlea: Normal 6. Lateral gutter/popliteus tendon: Normal 7. Hoffa's fat pad: Inflamed 8. Medial  gutter/plica: Normal, except multiple cartilaginous loose bodies 9. ACL: Normal 10. PCL: Normal 11. Medial meniscus: Complex radial and horizontal tear of the posterior horn affecting approximately 30% of the meniscus width 12. Medial compartment cartilage: Extensive grade 3-4 degenerative changes to the medial femoral condyle; grade 1 degenerative changes the tibial plateau 13. Lateral meniscus: Incomplete discoid meniscus type morphology: Thickened anterior horn with connection from anterior horn to posterior horn just lateral to the intercondylar notch.  No meniscus over the central portion of the tibial plateau.  Meniscus body was not in its usual position with anterior horn fibers moving superiorly and posterior horn fibers moving superiorly at the level of the meniscus body.  There was also a horizontal tear of the posterior horn. 14. Lateral compartment cartilage: Grade 3-4 degenerative changes to the lateral femoral condyle; grade 2-3 degenerative changes to the tibial plateau    OPERATIVE REPORT:     I identified Cassidy Mathis in the pre-operative holding area. I marked the operative knee with my initials. I reviewed the risks and benefits of the proposed surgical intervention and the patient wished to proceed. The patient was transferred to the operative suite and placed in the supine position with all bony prominences padded.  Anesthesia was administered. Appropriate IV antibiotics were administered prior to incision. The extremity was then prepped and draped in standard fashion. A time out was performed confirming the correct extremity, correct patient, and correct procedure.   Arthroscopy portals were marked. Local anesthetic was injected to the planned portal sites. The anterolateral portal was established with an 11 blade.      The arthroscope was placed in the anterolateral portal and then into the suprapatellar pouch. Next,  the medial portal was established under needle  localization. A diagnostic knee scope was completed with the above findings.  The loose bodies were removed with an oscillating shaver.  The medial meniscus tear was identified.   The MCL was pie-crusted to improve visualization of the posterior horn. The medial meniscal tear was debrided using an arthroscopic biter and an oscillating shaver until the meniscus had stable borders.  Attention was then turned to the lateral meniscus.  The discoid portion connecting the anterior horn to the posterior horn medially was excised using combination of arthroscopic biters and an oscillating shaver.  The horizontal tear of the posterior horn was also debrided in a similar fashion.  The meniscus was probed extensively to ensure it was stable.  Next, A chondroplasty was performed of the medial compartment, lateral compartment, and patellofemoral compartment such that there were stable cartilage edges without any loose fragments of cartilage. Arthroscopic fluid was removed from the joint.   The portals were closed with 3-0 Nylon suture. Sterile dressings included Xeroform, 4x4s, Sof-Rol, and Bias wrap. A Polarcare was placed.  The patient was then awakened and taken to the PACU hemodynamically stable without complication.     POSTOPERATIVE PLAN: The patient will be discharged home today once they meet PACU criteria. Aspirin  325 mg daily was prescribed for 2 weeks for DVT prophylaxis.  Physical therapy will start on POD#3-4. Weight-bearing as tolerated. Follow up in 2 weeks per protocol.

## 2023-09-09 NOTE — ED Triage Notes (Signed)
 Pt presents to the ED via [facility] from [facility]. Pt reports waking up from her right meniscus repair and having central chest pain.

## 2023-09-10 LAB — CKMB (ARMC ONLY): CK, MB: 1.2 ng/mL (ref 0.5–5.0)

## 2023-09-10 LAB — MYOGLOBIN, SERUM: Myoglobin: 31 ng/mL (ref 25–58)

## 2024-01-08 NOTE — Progress Notes (Unsigned)
 PROVIDER NOTE: Interpretation of information contained herein should be left to medically-trained personnel. Specific patient instructions are provided elsewhere under Patient Instructions section of medical record. This document was created in part using AI and STT-dictation technology, any transcriptional errors that may result from this process are unintentional.  Patient: Cassidy Mathis  Service: E/M   [facility]P: [facility]  DOB: [birth year]  DOS: 01/09/2024  Provider: Eric DELENA Como, MD  MRN: [id number]  Delivery: Face-to-face  Specialty: Interventional Pain Management  Type: Established Patient  Setting: Ambulatory outpatient facility  Specialty designation: 09  Referring Prov.: [facility]  Location: Outpatient office facility       History of present illness (HPI) Ms. Cassidy Mathis, a 48 y.o. year old female, is here today because of her Lumbar facet joint pain [M54.59]. [name] primary complain today is Back Pain (Lower bilateral ), Knee Pain (Bilateral ), and Leg Pain (Bilateral )  Pertinent problems: 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); Chronic pain syndrome; Abnormal MRI, cervical spine (05/06/2019); Abnormal MRI, lumbar spine (12/06/2021); Chronic low back pain (1ry area of Pain) (Bilateral) (L>R) w/o sciatica; Chronic lower extremity pain (2ry area of Pain) (Bilateral) (L>R); Chronic sacroiliac joint pain (Bilateral) (R>L); Lumbosacral facet syndrome (Bilateral); Lumbar facet hypertrophy (L4-5) (Bilateral); Lumbosacral facet arthropathy; Somatic dysfunction of sacroiliac joints (Bilateral); Lumbosacral radiculopathy at L5 (Bilateral); Lumbosacral radiculopathy at S1 (Bilateral); Weakness of lower extremity (Left); Chronic hip pain (Bilateral) (L>R); Greater trochanteric bursitis of hips (Bilateral) (R>L); DDD (degenerative disc disease), lumbosacral; Chronic generalized pain; Osteoarthritis of knee  (Left); Chronic low back pain (Bilateral) w/ sciatica (Bilateral); Lumbar facet joint pain; Spondylosis without myelopathy or radiculopathy, lumbosacral region; Chronic knee pain (Bilateral); and Osteoarthritis of knees (Bilateral) on their pertinent problem list.  Pain Assessment: Severity of Chronic pain is reported as a 9 /10. Location: Back Lower, Right, Left/Radaites with tingling and numbness down outer legs bilateral to feet bilateral. Onset: More than a month ago. Quality: Constant, Tingling, Nagging, Sharp, Aching, Pressure. Timing: Constant. Modifying factor(s): Denies. Vitals:  height is 5' 5 (1.651 m) and weight is 210 lb (95.3 kg). Her temporal temperature is 99 F (37.2 C). Her blood pressure is 140/91 (abnormal) and her pulse is 73. Her respiration is 16 and oxygen saturation is 100%.  BMI: Estimated body mass index is 34.95 kg/m as calculated from the following:   Height as of this encounter: 5' 5 (1.651 m).   Weight as of this encounter: 210 lb (95.3 kg).  Last encounter: 02/17/2023. Last procedure: 02/17/2023.  Reason for encounter: evaluation of worsening, or previously known (established) problem. On 02/03/2023 the patient had a left-sided L3-4 LESI #1 done under fluoroscopic guidance and IV sedation where on the day of the procedure she had indicated her pain to be a 10/10 and immediately after the procedure when she left the office her pain was a 0/10.  She was scheduled to return for face-to-face follow-up evaluation on 02/17/2023, but in reviewing the note : The patient indicates having changed her face-to-face appointment for a virtual visit due to having developed an upper respiratory infection and having had a fall when she slipped on ice. She ended up hitting her left side and twisting her left knee. She went to the emergency room where they did some x-rays and she has an appointment to follow-up with the orthopedic surgeons at the [facility] next week. She is currently  having more pain on the  right lower extremity going all the way down to the top of her right foot and what appears to be an L5 dermatomal distribution. For this reason, I will be scheduling her to return for a right-sided L5-S1 LESI #1 under fluoroscopic guidance. Will schedule this in about 2 weeks from now to allow her to recover from her open respiratory infection. The plan was shared with the patient who understood and accepted.   Discussed the use of AI scribe software for clinical note transcription with the patient, who gave verbal consent to proceed.  History of Present Illness   Cassidy Mathis is a 48 year old female who presents with a flare-up of back and leg pain.  She returns after a left L3/4 LESI on February 03, 2023. Planned in-person follow-up on February 17, 2023 was switched to virtual due to an upper respiratory infection and a fall on ice that caused a left knee injury.  After the fall she was evaluated in the emergency department with x-rays and seen by orthopedic surgery at [facility]. Since then she has had increased right lower extremity pain radiating to the right foot.  She has chronic bilateral knee pain after prior meniscus surgeries, which affects comfort and positioning.  She reports constant bilateral sciatic pain from the lateral legs to the feet with numbness, worse at rest. Pain limits her ability to stand upright and perform activities like washing dishes. Back pain is more severe than leg pain.  She takes blood pressure medication. She does not use blood thinners and does not have diabetes.       Pharmacotherapy Assessment   No chronic opioid analgesics therapy prescribed by our practice. Hydrocodone /APAP 5/325, 1 tab p.o. 4 times daily (#10) (last filled 08/08/2022) MME/day: 20 mg/day   Monitoring:  PMP: PDMP reviewed during this encounter.       Pharmacotherapy: No side-effects or adverse reactions reported. Compliance: No problems  identified. Effectiveness: Clinically acceptable.  No notes on file  UDS:  Summary  Date Value Ref Range Status  12/29/2020 Note  Final    Comment:    ==================================================================== Compliance Drug Analysis, Ur ==================================================================== Test                             Result       Flag       Units  Drug Present and Declared for Prescription Verification   Butalbital                      PRESENT      EXPECTED   Gabapentin                      PRESENT      EXPECTED  Drug Present not Declared for Prescription Verification   Carboxy-THC                    96           UNEXPECTED ng/mg creat    Carboxy-THC is a metabolite of tetrahydrocannabinol (THC). Source of    THC is most commonly herbal marijuana or marijuana-based products,    but THC is also present in a scheduled prescription medication.    Trace amounts of THC can be present in hemp and cannabidiol (CBD)    products. This test is not intended to distinguish between delta-9-    tetrahydrocannabinol, the predominant form of THC in most  herbal or    marijuana-based products, and delta-8-tetrahydrocannabinol.  Drug Absent but Declared for Prescription Verification   Acetaminophen                   Not Detected UNEXPECTED    Acetaminophen , as indicated in the declared medication list, is not    always detected even when used as directed.  ==================================================================== Test                      Result    Flag   Units      Ref Range   Creatinine              54               mg/dL      >=79 ==================================================================== Declared Medications:  The flagging and interpretation on this report are based on the  following declared medications.  Unexpected results may arise from  inaccuracies in the declared medications.   **Note: The testing scope of this panel includes these  medications:   Butalbital  (Fioricet )  Gabapentin  (Neurontin )   **Note: The testing scope of this panel does not include small to  moderate amounts of these reported medications:   Acetaminophen  (Fioricet )   **Note: The testing scope of this panel does not include the  following reported medications:   Albuterol  (Ventolin  HFA)  Amlodipine  (Norvasc )  Caffeine  (Fioricet )  Cyanocobalamin   Esomeprazole  (Nexium )  Lisinopril  (Zestril ) ==================================================================== For clinical consultation, please call [phone]. ====================================================================     No results found for: CBDTHCR No results found for: D8THCCBX No results found for: D9THCCBX  ROS  Constitutional: Denies any fever or chills Gastrointestinal: No reported hemesis, hematochezia, vomiting, or acute GI distress Musculoskeletal: Denies any acute onset joint swelling, redness, loss of ROM, or weakness Neurological: No reported episodes of acute onset apraxia, aphasia, dysarthria, agnosia, amnesia, paralysis, loss of coordination, or loss of consciousness  Medication Review  acetaminophen , albuterol , amLODipine , and losartan  History Review  Allergy: Ms. Polanco is allergic to ace inhibitors and lisinopril . Drug: Ms. Heatwole  reports that she does not currently use drugs after having used the following drugs: Marijuana. Frequency: 1.00 time per week. Alcohol:  reports current alcohol use. Tobacco:  reports that she has been smoking cigarettes and cigars. She has a 6 pack-year smoking history. She has never used smokeless tobacco. Social: Ms. Eickhoff  reports that she has been smoking cigarettes and cigars. She has a 6 pack-year smoking history. She has never used smokeless tobacco. She reports current alcohol use. She reports that she does not currently use drugs after having used the following drugs: Marijuana. Frequency: 1.00 time per  week. Medical:  has a past medical history of Allergy, Anemia, Anxiety, Arthritis, Asthma, Cancer (HCC), Claustrophobia, Depression, GERD (gastroesophageal reflux disease), Headache, Hypertension, Pancreatitis, Pilonidal cyst, Pneumonia (2018), and Sciatica. Surgical: Ms. Gassen  has a past surgical history that includes Tubal ligation; epigastric hernia repair (N/A, 04/18/2015); Hernia repair; Cholecystectomy (10/21/2015); ERCP (N/A, 12/21/2016); Cyst excision; Cholecystectomy (Bilateral); Cervical conization w/bx (N/A, 03/23/2017); Radical hysterectomy (04/14/2017); Salpingectomy (Bilateral, 04/14/2017); Sentinel lymph node biopsy; Abdominal hysterectomy; Lysis of adhesion (N/A, 01/01/2020); Laparoscopic salpingo oophorectomy (Right, 01/01/2020); Colonoscopy with propofol  (N/A, 12/06/2020); Esophagogastroduodenoscopy (12/06/2020); Givens capsule study (N/A, 12/08/2020); VISCERAL ANGIOGRAPHY (N/A, 12/11/2020); Knee arthroscopy with meniscal repair (Left, 05/27/2023); and Knee arthroscopy with lateral menisectomy (Right, 09/09/2023). Family: family history includes Cancer in her maternal grandfather, maternal grandmother, paternal grandfather, paternal grandmother, and paternal uncle; Gallstones in her  mother; Gout in her father; Hypertension in her father and mother.  Laboratory Chemistry Profile   Renal Lab Results  Component Value Date   BUN 12 09/09/2023   CREATININE 0.84 09/09/2023   BCR 8 (L) 11/28/2020   GFRAA >60 05/05/2019   GFRNONAA >60 09/09/2023    Hepatic Lab Results  Component Value Date   AST 244 (H) 09/09/2023   ALT 121 (H) 09/09/2023   ALBUMIN 3.6 09/09/2023   ALKPHOS 99 09/09/2023   HCVAB 0.1 12/16/2016   LIPASE 87 (H) 12/04/2020    Electrolytes Lab Results  Component Value Date   NA 140 09/09/2023   K 3.4 (L) 09/09/2023   CL 108 09/09/2023   CALCIUM 8.7 (L) 09/09/2023   MG 2.2 12/29/2020   PHOS 3.4 12/08/2020    Bone Lab Results  Component Value Date   25OHVITD1 8.9 (L)  12/29/2020   25OHVITD2 <1.0 12/29/2020   25OHVITD3 8.9 12/29/2020    Inflammation (CRP: Acute Phase) (ESR: Chronic Phase) Lab Results  Component Value Date   CRP 8 12/29/2020   ESRSEDRATE 62 (H) 12/29/2020         Note: Above Lab results reviewed.  Recent Imaging Review  DG Chest Portable 1 View CLINICAL DATA:  Chest pain.  EXAM: PORTABLE CHEST 1 VIEW  COMPARISON:  September 09, 2023.  FINDINGS: Stable cardiomediastinal silhouette. Both lungs are clear. The visualized skeletal structures are unremarkable.  IMPRESSION: No active disease.  Electronically Signed   By: Lynwood Landy Raddle M.D.   On: 09/09/2023 11:21 DG Chest Port 1 View CLINICAL DATA:  355200 Chest pain 355200  EXAM: PORTABLE CHEST - 1 VIEW  COMPARISON:  November 08, 2020  FINDINGS: No focal airspace consolidation, pleural effusion, or pneumothorax. No cardiomegaly.No acute fracture or destructive lesion.  IMPRESSION: No acute cardiopulmonary abnormality.  Electronically Signed   By: Rogelia Myers M.D.   On: 09/09/2023 09:42 Note: Reviewed        Physical Exam  Vitals: BP (!) 140/91 (Patient Position: Sitting, Cuff Size: Normal)   Pulse 73   Temp 99 F (37.2 C) (Temporal)   Resp 16   Ht 5' 5 (1.651 m)   Wt 210 lb (95.3 kg)   LMP 03/18/2017   SpO2 100%   BMI 34.95 kg/m  BMI: Estimated body mass index is 34.95 kg/m as calculated from the following:   Height as of this encounter: 5' 5 (1.651 m).   Weight as of this encounter: 210 lb (95.3 kg). Ideal: Ideal body weight: 57 kg (125 lb 10.6 oz) Adjusted ideal body weight: 72.3 kg (159 lb 6.4 oz) General appearance: Well nourished, well developed, and well hydrated. In no apparent acute distress Mental status: Alert, oriented x 3 (person, place, & time)       Respiratory: No evidence of acute respiratory distress Eyes: PERLA   Assessment   Diagnosis Status  1. Lumbar facet joint pain   2. Chronic low back pain (1ry area of Pain)  (Bilateral) (L>R) w/o sciatica   3. Lumbar facet hypertrophy (L4-5) (Bilateral)   4. Lumbosacral facet arthropathy   5. Lumbosacral facet syndrome (Bilateral)   6. Spondylosis without myelopathy or radiculopathy, lumbosacral region   7. Chronic knee pain (Bilateral)   8. Osteoarthritis of knees (Bilateral)    Controlled Controlled Controlled   Updated Problems: Problem  Chronic knee pain (Bilateral)  Osteoarthritis of knees (Bilateral)  Lumbosacral radiculopathy at L5 (Bilateral)  Lumbosacral radiculopathy at S1 (Bilateral)  Acute Bilateral Low Back  Pain With Bilateral Sciatica (Resolved)    Plan of Care  Problem-specific:  Assessment and Plan    Chronic low back pain with bilateral lumbosacral radiculopathy and sciatica   Persistent low back pain involves the L5 and S1 dermatomes, with constant pain more problematic than leg pain, indicating facet joint involvement rather than nerve roots. Scheduled bilateral lumbar facet block with sedation. She must have a driver for the procedure. A follow-up appointment is scheduled two weeks post-procedure to evaluate results.  Lumbar facet joint pain   The nature of the pain and its impact on posture and daily activities suggest lumbar facet joint pain. Constant pain affects her ability to stand straight and perform tasks like washing dishes. Scheduled bilateral lumbar facet block with sedation. She must have a driver for the procedure. A follow-up appointment is scheduled two weeks post-procedure to evaluate results.  Chronic bilateral lower extremity pain   Chronic pain extends from the lateral aspect of the legs to the bottom of the feet, consistent with L5 and S1 nerve root involvement. Pain is constant, exacerbated by rest, and accompanied by numbness. Scheduled bilateral lumbar facet block with sedation. She must have a driver for the procedure. A follow-up appointment is scheduled two weeks post-procedure to evaluate results.        Ms. Danny Zimny Warning has a current medication list which includes the following long-term medication(s): albuterol , amlodipine , and losartan.  Pharmacotherapy (Medications Ordered): No orders of the defined types were placed in this encounter.  Orders:  Orders Placed This Encounter  Procedures   LUMBAR FACET(MEDIAL BRANCH NERVE BLOCK) MBNB    Diagnosis: Lumbar Facet Syndrome (M47.816); Lumbosacral Facet Syndrome (M47.817); Lumbar Facet Joint Pain (M54.59) Medical Necessity Statement: 1.Severe chronic axial low back pain causing functional impairment documented by ongoing pain scale assessments. 2.Pain present for longer than 3 months (Chronic) documented to have failed noninvasive conservative therapies. 3.Absence of untreated radiculopathy. 4.There is no radiological evidence of untreated fractures, tumor, infection, or deformity.  Physical Examination Findings: Positive Kemp Maneuver: (Y)  Positive Lumbar Hyperextension-Rotation provocative test: (Y)    Standing Status:   Future    Expiration Date:   04/08/2024    Scheduling Instructions:     Procedure: Lumbar facet Block     Type: Medial Branch Block     Side: Bilateral     Purpose: Diagnostic/Therapeutic     Level(s): L3-4, L4-5, and L5-S1 Facets (L2, L3, L4, L5, and S1 Medial Branch)     Sedation: With Sedation.     Timeframe: ASAP    Where will this procedure be performed?:   [facility]     Interventional Therapies  Risk  Complexity Considerations:   Abnormal UDS  GERD  nicotine dependence  hypertension     Planned  Pending:   Diagnostic bilateral lumbar facet MBB x2    Under consideration:   Diagnostic/Therapeutic midline L5-S1 LESI #1  Diagnostic bilateral lumbar facet MBB #2  Diagnostic bilateral sacroiliac joint block #1  Diagnostic bilateral IA hip joint injection #1  Diagnostic bilateral trochanteric bursa injection #1    Completed:   Diagnostic/Therapeutic left L3-4 LESI x1  (02/03/2023) (100/90/0.  The patient fell onto her left side a couple days after her treatment.) Diagnostic left L4-5 LESI x2 (12/15/2021) (10-0/10) (1st:100/100/80 x1 week/0) (10-0/10) (2nd:100/100/60 x1 week/0)  Diagnostic bilateral lumbar facet MBB x1 (10/05/2022) (8-0/10) (100/100/100 x 3 days/10-20)  PT referral (12/29/2020) Referral to physical therapy entered to again (10/19/2022)    Completed by  Delon Gins, MD [facility] PMR):   Diagnostic/therapeutic bilateral L5 TFESI x2 (06/04/2019 & 08/02/2019) (no benefit & painful)    Therapeutic  Palliative (PRN) options:   None established     Return for (ECT):(B) L-FCT Blk #2.    Recent Visits No visits were found meeting these conditions. Showing recent visits within past 90 days and meeting all other requirements Today's Visits Date Type Provider Dept  01/09/24 Office Visit Tanya Glisson, MD [facility]  Showing today's visits and meeting all other requirements Future Appointments No visits were found meeting these conditions. Showing future appointments within next 90 days and meeting all other requirements  I discussed the assessment and treatment plan with the patient. The patient was provided an opportunity to ask questions and all were answered. The patient agreed with the plan and demonstrated an understanding of the instructions.  Patient advised to call back or seek an in-person evaluation if the symptoms or condition worsens.  Duration of encounter: 30 minutes.  Total time on encounter, as per AMA guidelines included both the face-to-face and non-face-to-face time personally spent by the physician and/or other qualified health care professional(s) on the day of the encounter (includes time in activities that require the physician or other qualified health care professional and does not include time in activities normally performed by clinical staff). Physician's time may include the following activities  when performed: Preparing to see the patient (e.g., pre-charting review of records, searching for previously ordered imaging, lab work, and nerve conduction tests) Review of prior analgesic pharmacotherapies. Reviewing PMP Interpreting ordered tests (e.g., lab work, imaging, nerve conduction tests) Performing post-procedure evaluations, including interpretation of diagnostic procedures Obtaining and/or reviewing separately obtained history Performing a medically appropriate examination and/or evaluation Counseling and educating the patient/family/caregiver Ordering medications, tests, or procedures Referring and communicating with other health care professionals (when not separately reported) Documenting clinical information in the electronic or other health record Independently interpreting results (not separately reported) and communicating results to the patient/ family/caregiver Care coordination (not separately reported)  Note by: Glisson DELENA Tanya, MD (TTS and AI technology used. I apologize for any typographical errors that were not detected and corrected.) Date: 01/09/2024; Time: 10:09 AM

## 2024-01-09 ENCOUNTER — Telehealth: Payer: Self-pay | Admitting: Pain Medicine

## 2024-01-09 ENCOUNTER — Encounter: Payer: Self-pay | Admitting: Pain Medicine

## 2024-01-09 ENCOUNTER — Ambulatory Visit: Attending: Pain Medicine | Admitting: Pain Medicine

## 2024-01-09 VITALS — BP 140/91 | HR 73 | Temp 99.0°F | Resp 16 | Ht 65.0 in | Wt 210.0 lb

## 2024-01-09 DIAGNOSIS — M47816 Spondylosis without myelopathy or radiculopathy, lumbar region: Secondary | ICD-10-CM

## 2024-01-09 DIAGNOSIS — M170 Bilateral primary osteoarthritis of knee: Secondary | ICD-10-CM | POA: Diagnosis present

## 2024-01-09 DIAGNOSIS — M47817 Spondylosis without myelopathy or radiculopathy, lumbosacral region: Secondary | ICD-10-CM | POA: Insufficient documentation

## 2024-01-09 DIAGNOSIS — M5459 Other low back pain: Secondary | ICD-10-CM | POA: Diagnosis present

## 2024-01-09 DIAGNOSIS — G8929 Other chronic pain: Secondary | ICD-10-CM | POA: Insufficient documentation

## 2024-01-09 DIAGNOSIS — M25561 Pain in right knee: Secondary | ICD-10-CM | POA: Diagnosis present

## 2024-01-09 DIAGNOSIS — M545 Low back pain, unspecified: Secondary | ICD-10-CM | POA: Insufficient documentation

## 2024-01-09 DIAGNOSIS — M25562 Pain in left knee: Secondary | ICD-10-CM | POA: Insufficient documentation

## 2024-01-09 NOTE — Telephone Encounter (Signed)
 PT stated that she would like something to help her sleep for the pain.

## 2024-01-09 NOTE — Patient Instructions (Signed)
 ______________________________________________________________________    Procedure instructions  Stop blood-thinners  Do not eat or drink fluids (other than water) for 6 hours before your procedure  No water for 2 hours before your procedure  Take your blood pressure medicine with a sip of water  Arrive 30 minutes before your appointment  If sedation is planned, bring suitable driver. [place], [place], & public transportation are NOT APPROVED)  Carefully read the Preparing for your procedure detailed instructions  If you have questions call us  at [phone]  Procedure appointments are for procedures only.   NO medication refills or new problem evaluations will be done on procedure days.   Only the scheduled, pre-approved procedure and side will be done.   ______________________________________________________________________     ______________________________________________________________________    Preparing for your procedure  Appointments: If you think you may not be able to keep your appointment, call 24-48 hours in advance to cancel. We need time to make it available to others.  Procedure visits are for procedures only. During your procedure appointment there will be: NO Prescription Refills*. NO medication changes or discussions*. NO discussion of disability issues*. NO unrelated pain problem evaluations*. NO evaluations to order other pain procedures*. *These will be addressed at a separate and distinct evaluation encounter on the provider's evaluation schedule and not during procedure days.  Instructions: Food intake: Avoid eating anything solid for at least 8 hours prior to your procedure. Clear liquid intake: You may take clear liquids such as water up to 2 hours prior to your procedure. (No carbonated drinks. No soda.) Transportation: Unless otherwise stated by your physician, bring a driver. (Driver cannot be a Market Researcher, Pharmacist, Community, or any other form of public  transportation.) Morning Medicines: Except for blood thinners, take all of your other morning medications with a sip of water. Make sure to take your heart and blood pressure medicines. If your blood pressure's lower number is above 100, the case will be rescheduled. Blood thinners: Make sure to stop your blood thinners as instructed.  If you take a blood thinner, but were not instructed to stop it, call our office [phone] and ask to talk to a nurse. Not stopping a blood thinner prior to certain procedures could lead to serious complications. Diabetics on insulin: Notify the staff so that you can be scheduled 1st case in the morning. If your diabetes requires high dose insulin, take only  of your normal insulin dose the morning of the procedure and notify the staff that you have done so. Preventing infections: Shower with an antibacterial soap the morning of your procedure.  Build-up your immune system: Take 1000 mg of Vitamin C with every meal (3 times a day) the day prior to your procedure. Antibiotics: Inform the nursing staff if you are taking any antibiotics or if you have any conditions that may require antibiotics prior to procedures. (Example: recent joint implants)   Pregnancy: If you are pregnant make sure to notify the nursing staff. Not doing so may result in injury to the fetus, including death.  Sickness: If you have a cold, fever, or any active infections, call and cancel or reschedule your procedure. Receiving steroids while having an infection may result in complications. Arrival: You must be in the facility at least 30 minutes prior to your scheduled procedure. Tardiness: Your scheduled time is also the cutoff time. If you do not arrive at least 15 minutes prior to your procedure, you will be rescheduled.  Children: Do not bring any children with  you. Make arrangements to keep them home. Dress appropriately: There is always a possibility that your clothing may get soiled. Avoid  long dresses. Valuables: Do not bring any jewelry or valuables.  Reasons to call and reschedule or cancel your procedure: (Following these recommendations will minimize the risk of a serious complication.) Surgeries: Avoid having procedures within 2 weeks of any surgery. (Avoid for 2 weeks before or after any surgery). Flu Shots: Avoid having procedures within 2 weeks of a flu shots or . (Avoid for 2 weeks before or after immunizations). Barium: Avoid having a procedure within 7-10 days after having had a radiological study involving the use of radiological contrast. (Myelograms, Barium swallow or enema study). Heart attacks: Avoid any elective procedures or surgeries for the initial 6 months after a Myocardial Infarction (Heart Attack). Blood thinners: It is imperative that you stop these medications before procedures. Let us  know if you if you take any blood thinner.  Infection: Avoid procedures during or within two weeks of an infection (including chest colds or gastrointestinal problems). Symptoms associated with infections include: Localized redness, fever, chills, night sweats or profuse sweating, burning sensation when voiding, cough, congestion, stuffiness, runny nose, sore throat, diarrhea, nausea, vomiting, cold or Flu symptoms, recent or current infections. It is specially important if the infection is over the area that we intend to treat. Heart and lung problems: Symptoms that may suggest an active cardiopulmonary problem include: cough, chest pain, breathing difficulties or shortness of breath, dizziness, ankle swelling, uncontrolled high or unusually low blood pressure, and/or palpitations. If you are experiencing any of these symptoms, cancel your procedure and contact your primary care physician for an evaluation.  Remember:  Regular Business hours are:  Monday to Thursday 8:00 AM to 4:00 PM  Provider's Schedule: Eric Como, MD:  Procedure days: Tuesday and Thursday 7:30  AM to 4:00 PM  Wallie Sherry, MD:  Procedure days: Monday and Wednesday 7:30 AM to 4:00 PM Last  Updated: 01/04/2023 ______________________________________________________________________     ______________________________________________________________________    General Risks and Possible Complications  Patient Responsibilities: It is important that you read this as it is part of your informed consent. It is our duty to inform you of the risks and possible complications associated with treatments offered to you. It is your responsibility as a patient to read this and to ask questions about anything that is not clear or that you believe was not covered in this document.  Patients Rights: You have the right to refuse treatment. You also have the right to change your mind, even after initially having agreed to have the treatment done. However, under this last option, if you wait until the last second to change your mind, you may be charged for the materials used up to that point.  Introduction: Medicine is not an visual merchandiser. Everything in Medicine, including the lack of treatment(s), carries the potential for danger, harm, or loss (which is by definition: Risk). In Medicine, a complication is a secondary problem, condition, or disease that can aggravate an already existing one. All treatments carry the risk of possible complications. The fact that a side effects or complications occurs, does not imply that the treatment was conducted incorrectly. It must be clearly understood that these can happen even when everything is done following the highest safety standards.  No treatment: You can choose not to proceed with the proposed treatment alternative. The PRO(s) would include: avoiding the risk of complications associated with the therapy. The CON(s) would include:  not getting any of the treatment benefits. These benefits fall under one of three categories: diagnostic; therapeutic; and/or  palliative. Diagnostic benefits include: getting information which can ultimately lead to improvement of the disease or symptom(s). Therapeutic benefits are those associated with the successful treatment of the disease. Finally, palliative benefits are those related to the decrease of the primary symptoms, without necessarily curing the condition (example: decreasing the pain from a flare-up of a chronic condition, such as incurable terminal cancer).  General Risks and Complications: These are associated to most interventional treatments. They can occur alone, or in combination. They fall under one of the following six (6) categories: no benefit or worsening of symptoms; bleeding; infection; nerve damage; allergic reactions; and/or death. No benefits or worsening of symptoms: In Medicine there are no guarantees, only probabilities. No healthcare provider can ever guarantee that a medical treatment will work, they can only state the probability that it may. Furthermore, there is always the possibility that the condition may worsen, either directly, or indirectly, as a consequence of the treatment. Bleeding: This is more common if the patient is taking a blood thinner, either prescription or over the counter (example: Goody Powders, Fish oil, Aspirin , Garlic, etc.), or if suffering a condition associated with impaired coagulation (example: Hemophilia, cirrhosis of the liver, low platelet counts, etc.). However, even if you do not have one on these, it can still happen. If you have any of these conditions, or take one of these drugs, make sure to notify your treating physician. Infection: This is more common in patients with a compromised immune system, either due to disease (example: diabetes, cancer, human immunodeficiency virus [HIV], etc.), or due to medications or treatments (example: therapies used to treat cancer and rheumatological diseases). However, even if you do not have one on these, it can still  happen. If you have any of these conditions, or take one of these drugs, make sure to notify your treating physician. Nerve Damage: This is more common when the treatment is an invasive one, but it can also happen with the use of medications, such as those used in the treatment of cancer. The damage can occur to small secondary nerves, or to large primary ones, such as those in the spinal cord and brain. This damage may be temporary or permanent and it may lead to impairments that can range from temporary numbness to permanent paralysis and/or brain death. Allergic Reactions: Any time a substance or material comes in contact with our body, there is the possibility of an allergic reaction. These can range from a mild skin rash (contact dermatitis) to a severe systemic reaction (anaphylactic reaction), which can result in death. Death: In general, any medical intervention can result in death, most of the time due to an unforeseen complication. ______________________________________________________________________      ______________________________________________________________________    Steroid injections  Common steroids for injections Triamcinolone : Used by many sports medicine physicians for large joint and bursal injections, often combined with a local anesthetic like lidocaine . A study focusing on coccydynia (tailbone pain) found triamcinolone  was more effective than betamethasone, suggesting it may also be preferable for other localized inflammation conditions. Methylprednisolone : A common alternative to triamcinolone  that is also a strong anti-inflammatory. It is available in different formulations, with the acetate suspension being the long-acting option for intra-articular injections. Dexamethasone : This is a non-particulate steroid, meaning it has a lower risk of tissue damage compared to particulate steroids like triamcinolone  and methylprednisolone . While less common for this specific  use,  it is an option for targeted injections.   Considerations for physicians Particulate vs. non-particulate steroids: Triamcinolone  and methylprednisolone  are particulate, meaning they can clump together. Dexamethasone  is non-particulate. Particulate steroids are often preferred for their longer-lasting effects but carry a theoretical higher risk for certain injections (though this is less of a concern in the costochondral joints). Combined injectate: Corticosteroids are typically mixed with a local anesthetic like lidocaine  to provide both immediate pain relief (from the anesthetic) and longer-term inflammation reduction (from the steroid). Imaging guidance: To ensure accurate placement of the needle and medication, physicians may use ultrasound or fluoroscopic guidance for the injection, especially in complex or refractory cases.   Patient guidance Before undergoing a steroid injection, discuss the options with your physician. They will determine the best steroid, dosage, and procedure for your specific case based on factors like: Severity of your condition History of response to other treatments Your overall health status Experience and preference of the physician  Last  Updated: 09/20/2023 ______________________________________________________________________     ______________________________________________________________________    Patient information on: Body mass index (BMI) and Weight Management  Dear Ms. Cleverly you are receiving this information because your weight may be adversely affecting your health.   Your current Estimated body mass index is 34.95 kg/m as calculated from the following:   Height as of this encounter: 5' 5 (1.651 m).   Weight as of this encounter: 210 lb (95.3 kg).  We recommend you talk to your primary care physician about providing or referring you to a supervised weight management program.  Here is some information about weight and the body mass  index (BMI) classification:  BMI is a measure of obesity that's calculated by dividing a person's weight in kilograms by their height in meters squared. A person can use an online calculator to determine their BMI. Body mass index (BMI) is a common tool for deciding whether a person has an appropriate body weight.  It measures a person's weight in relation to their height.  According to the [facility] of health (NIH): A BMI of less than 18.5 means that a person is underweight. A BMI of between 18.5 and 24.9 is ideal. A BMI of between 25 and 29.9 is overweight. A BMI over 30 indicates obesity.  Body Mass Index (BMI) Classification BMI level (kg/m2) Category Associated incidence of chronic pain  <18  Underweight   18.5-24.9 Ideal body weight   25-29.9 Overweight  20%  30-34.9 Obese (Class I)  68%  35-39.9 Severe obesity (Class II)  136%  >40 Extreme obesity (Class III)  254%    Morbidly Obese Classification: You will be considered to be Morbidly Obese if your BMI is above 30 and you have one or more of the following conditions caused or associated to obesity: 1.    Type 2 Diabetes (Leading to cardiovascular diseases (CVD), stroke, peripheral vascular diseases (PVD), retinopathy, nephropathy, and neuropathy) 2.    Cardiovascular Disease (High Blood Pressure; Congestive Heart Failure; High Cholesterol; Coronary Artery Disease; Angina; Arrhythmias, Dysrhythmias, or Heart Attacks) 3.    Breathing problems (Asthma; obesity-hypoventilation syndrome; obstructive sleep apnea; chronic inflammatory airway disease; reactive airway disease; or shortness of breath) 4.    Chronic kidney disease 5.    Liver disease (nonalcoholic fatty liver disease) 6.    High blood pressure 7.    Acid reflux (gastroesophageal reflux disease; heartburn) 8.    Osteoarthritis (OA) (affecting the hip(s), the knee(s) and/or the lower back) (usually requiring knee and/or hip replacements, as  well as back  surgeries) 9.    Low back pain (Lumbar Facet Syndrome; and/or Degenerative Disc Disease) 10.  Hip pain (Osteoarthritis of hip) (For every 1 lbs of added body weight, there is a 2 lbs increase in pressure inside of each hip articulation. 1:2 mechanical relationship) 11.  Knee pain (Osteoarthritis of knee) (For every 1 lbs of added body weight, there is a 4 lbs increase in pressure inside of each knee articulation. 1:4 mechanical relationship) (patients with a BMI>30 kg/m2 were 6.8 times more likely to develop knee OA than normal-weight individuals) 12.  Cancer: Epidemiological studies have shown that obesity is a risk factor for: post-menopausal breast cancer; cancers of the endometrium, colon and kidney cancer; malignant adenomas of the esophagus. Obese subjects have an approximately 1.5-3.5-fold increased risk of developing these cancers compared with normal-weight subjects, and it has been estimated that between 15 and 45% of these cancers can be attributed to overweight. More recent studies suggest that obesity may also increase the risk of other types of cancer, including pancreatic, hepatic and gallbladder cancer. (Ref: Obesity and cancer. Pischon T, Nthlings U, [employer] H. Proc Nutr Soc. 2008 May;67(2):128-45. doi: 10.1017/S0029665108006976.) The International Agency for Research on Cancer (IARC) has identified 13 cancers associated with overweight and obesity: meningioma, multiple myeloma, adenocarcinoma of the esophagus, and cancers of the thyroid, postmenopausal breast cancer, gallbladder, stomach, liver, pancreas, kidney, ovaries, uterus, colon and rectal (colorectal) cancers. 55 percent of all cancers diagnosed in women and 24 percent of those diagnosed in men are associated with overweight and obesity.  Recommendation: If you have any of the above conditions it is urgent that you take a step back and concentrate in losing weight. Dedicate 100% of your efforts on this task. Nothing else will improve  your health more than bringing your weight down and your BMI to less than 30.   Nutritionist and/or supervised weight-management program: We are aware that most chronic pain patients are unable to exercise secondary to their pain. For this reason, you must rely on proper nutrition and diet in order to lose the weight. We recommend you talk to a nutritionist.   Bariatric surgery: A person might be considered a candidate for bariatric surgery if they meet one of the following BMI criteria:  BMI of 40 or higher: This is considered extreme obesity (Class III). BMI of 35-39.9: This is considered obesity, and the person might also have a serious weight-related health condition, such as high blood pressure, type 2 diabetes, or severe sleep apnea  BMI of 30-34.9: This might be considered if the person has serious weight-related health problems and hasn't had substantial weight loss or improvement in co-morbidities through other methods   On your own: A realistic goal is to lose 10% of your body weight over a period of 12 months.  If over a period of six (6) months you have unsuccessfully tried to lose weight, then it is time for you to seek professional help and to enter a medically supervised weight management program, and/or undergo bariatric surgery.   Pain management considerations and possible limitations:  1.    Pharmacological Problems: Be advised that the use of opioid analgesics (oxycodone ; hydrocodone ; morphine ; methadone; codeine; and all of their derivatives) have been associated with decreased metabolism and weight gain.  For this reason, should we see that you are unable to lose weight while taking these medications, it may become necessary for us  to taper down and indefinitely discontinue them.  2.  Technical Problems: The incidence of successful interventional therapies decreases as the patient's BMI increases. It is much more difficult to accomplish a safe and effective interventional therapy  on a patient with a BMI above 35. 3.    Radiation Exposure Problems: The x-rays machine, used to accomplish injection therapies, will automatically increase their x-ray output in order to capture an appropriate bone image. This means that radiation exposure increases exponentially with the patient's BMI. (The higher the BMI, the higher the radiation exposure.) Although the level of radiation used at a given time is still safe to the patient, it is not for the physician and/or assisting staff. Unfortunately, radiation exposure is accumulative. Because physicians and the staff have to do procedures and be exposed on a daily basis, this can result in health problems such as cancer and radiation burns. Radiation exposure to the staff is monitored by the radiation batches that they wear. The exposure levels are reported back to the staff on a quarterly basis. Depending on levels of exposure, physicians and staff may be obligated by law to decrease this exposure. This means that they have the right and obligation to refuse providing therapies where they may be overexposed to radiation. For this reason, physicians may decline to offer therapies such as radiofrequency ablation or implants to patients with a BMI above 40. 4.    Current Trends: Be advised that the current trend is to no longer offer certain therapies to patients with a BMI equal to, or above 35, due to increase perioperative risks, increased technical procedural difficulties, and excessive radiation exposure to healthcare personnel.  Last updated: 10/18/2022 ______________________________________________________________________

## 2024-01-12 NOTE — Telephone Encounter (Signed)
 Attempt to contact patient to inform her we can not prescribe mediation to help her sleep. No answer and left VM.

## 2024-01-20 ENCOUNTER — Emergency Department

## 2024-01-20 ENCOUNTER — Emergency Department
Admission: EM | Admit: 2024-01-20 | Discharge: 2024-01-20 | Disposition: A | Discharge status: Home / Self Care | Attending: Emergency Medicine | Admitting: Emergency Medicine

## 2024-01-20 ENCOUNTER — Other Ambulatory Visit: Payer: Self-pay

## 2024-01-20 ENCOUNTER — Encounter: Payer: Self-pay | Admitting: Emergency Medicine

## 2024-01-20 DIAGNOSIS — M25561 Pain in right knee: Secondary | ICD-10-CM | POA: Insufficient documentation

## 2024-01-20 DIAGNOSIS — Z8541 Personal history of malignant neoplasm of cervix uteri: Secondary | ICD-10-CM | POA: Insufficient documentation

## 2024-01-20 DIAGNOSIS — G8929 Other chronic pain: Secondary | ICD-10-CM | POA: Insufficient documentation

## 2024-01-20 DIAGNOSIS — M25562 Pain in left knee: Secondary | ICD-10-CM | POA: Insufficient documentation

## 2024-01-20 DIAGNOSIS — I10 Essential (primary) hypertension: Secondary | ICD-10-CM | POA: Insufficient documentation

## 2024-01-20 MED ORDER — HYDROCODONE-ACETAMINOPHEN 5-325 MG PO TABS: 1.0000 | ORAL_TABLET | Freq: Four times a day (QID) | ORAL | 0 refills | Status: AC | PRN

## 2024-01-20 MED ORDER — PREDNISONE 50 MG PO TABS: ORAL_TABLET | ORAL | 0 refills | Status: AC

## 2024-01-20 MED ORDER — HYDROCODONE-ACETAMINOPHEN 5-325 MG PO TABS
1.0000 | ORAL_TABLET | Freq: Once | ORAL | Status: AC
  Administered 2024-01-20: 1 via ORAL
  Filled 2024-01-20: qty 1

## 2024-01-20 MED ORDER — LIDOCAINE 5 % EX PTCH
1.0000 | MEDICATED_PATCH | CUTANEOUS | Status: DC
  Administered 2024-01-20: 1 via TRANSDERMAL
  Filled 2024-01-20: qty 1

## 2024-01-20 MED ORDER — LIDOCAINE 5 % EX PTCH: 1.0000 | MEDICATED_PATCH | CUTANEOUS | 0 refills | Status: AC

## 2024-01-20 NOTE — ED Provider Notes (Signed)
 "  [facility] Provider Note    Event Date/Time   First MD Initiated Contact with Patient 01/20/24 [phone]     (approximate)   History   Knee Pain   HPI  Cassidy Mathis is a 48 y.o. female with PMH of hypertension, GERD, cervical cancer, osteoarthritis of both knees, and chronic pain presents for evaluation of bilateral knee pain.  Patient states that she has had knee pain for several years.  She explains that she has had knee surgery on both knees for treatment of torn meniscus.  She is followed by Dr. Tobie diarrhea and is in chronic pain management for back pain.  She reports that her pain has gotten to a level that she cannot tolerate anymore and is here for pain management.  She has not had any new falls or injury to the knee.  She has pain with weightbearing and occasional numbness and tingling down the legs which she attributes to her known sciatic nerve issue.      Physical Exam   Triage Vital Signs: ED Triage Vitals  Encounter Vitals Group     BP 01/20/24 0922 (!) 150/93     Girls Systolic BP Percentile --      Girls Diastolic BP Percentile --      Boys Systolic BP Percentile --      Boys Diastolic BP Percentile --      Pulse Rate 01/20/24 0922 78     Resp 01/20/24 0922 17     Temp 01/20/24 0922 98.1 F (36.7 C)     Temp Source 01/20/24 0922 Oral     SpO2 01/20/24 0922 96 %     Weight 01/20/24 0924 209 lb 7 oz (95 kg)     Height 01/20/24 0924 5' 5 (1.651 m)     Head Circumference --      Peak Flow --      Pain Score --      Pain Loc --      Pain Education --      Exclude from Growth Chart --     Most recent vital signs: Vitals:   01/20/24 0922  BP: (!) 150/93  Pulse: 78  Resp: 17  Temp: 98.1 F (36.7 C)  SpO2: 96%   General: Awake, no distress.  CV:  Good peripheral perfusion.  RRR. Resp:  Normal effort.  CTAB. Abd:  No distention.  Other:  Knees do not appear significantly swollen when compared to each other, tenderness to  palpation of the medial joint line of the right knee, tenderness to palpation of the left knee, knee range of motion well-maintained in both legs, sensation intact in both legs, posterior tibialis pulse 2+ bilaterally.   ED Results / Procedures / Treatments   Labs (all labs ordered are listed, but only abnormal results are displayed) Labs Reviewed - No data to display   RADIOLOGY  Right knee x-ray obtained, I interpreted the images as well as reviewed the radiologist report was negative for fractures but showed mild osteoarthritis.  PROCEDURES:  Critical Care performed: No  Procedures   MEDICATIONS ORDERED IN ED: Medications  lidocaine  (LIDODERM ) 5 % 1 patch (has no administration in time range)  HYDROcodone -acetaminophen  (NORCO/VICODIN) 5-325 MG per tablet 1 tablet (has no administration in time range)     IMPRESSION / MDM / ASSESSMENT AND PLAN / ED COURSE  I reviewed the triage vital signs and the nursing notes.  48 year old female presents for evaluation of bilateral knee pain.  Blood pressure was low but elevated but patient does have history of hypertension, vital signs stable otherwise.  Differential diagnosis includes, but is not limited to, chronic knee pain, arthritis, meniscus injury, ligament injury.  Patient's presentation is most consistent with exacerbation of chronic illness.  X-ray of the right knee shows mild osteoarthritis.  Patient presents with acute on chronic knee pain.  She reports she has tried lidocaine  patches and has not gotten much relief from those.  She is here today for additional pain management until she can see the orthopedic provider in January.  Patient was given a dose of pain medication while in the ED.  I reviewed her PDMP and will send a short course of pain medication for her.  Patient informed that I am limited in the amount of medication I can prescribe as an ER provider.  Did advise her to follow-up with  pain management.  She reports history of GI bleed so we will avoid NSAIDs.  She is not diabetic so we will prescribe steroids.  Patient voiced understanding, all questions were answered and she is stable at discharge.      FINAL CLINICAL IMPRESSION(S) / ED DIAGNOSES   Final diagnoses:  Chronic pain of both knees     Rx / DC Orders   ED Discharge Orders          Ordered    HYDROcodone -acetaminophen  (NORCO/VICODIN) 5-325 MG tablet  Every 6 hours PRN        01/20/24 1117    lidocaine  (LIDODERM ) 5 %  Every 24 hours        01/20/24 1117    predniSONE  (DELTASONE ) 50 MG tablet        01/20/24 1117             Note:  This document was prepared using Dragon voice recognition software and may include unintentional dictation errors.   Cleaster Tinnie LABOR, PA-C 01/20/24 1120    Dorothyann Drivers, MD 01/20/24 1829  "

## 2024-01-20 NOTE — ED Triage Notes (Signed)
 Pt reports bilateral knee surgery (meniscus repair), right done in September. Both knees are causing pain but right is worse over past 2 weeks.

## 2024-01-20 NOTE — ED Notes (Signed)
 See triage note  Presents with right knee pain  States pain started about 2 weeks ago w/o known injury

## 2024-01-20 NOTE — Discharge Instructions (Signed)
 You can take 650 mg of Tylenol  every 6 hours as needed for pain. You can use ice, heat, muscle creams and other topical pain relievers as well.  I have sent a strong pain medication called Norco to the pharmacy.  This medication can be taken every 4-6 hours as needed for severe or breakthrough pain.  This medication can cause dependency so only take if you are unable to control your pain with other medications.  It will make you sleepy so do not drive or operate heavy machinery after taking it.  Do not drink alcohol while taking this medication.  This medication can also cause constipation so please take an over-the-counter stool softener like MiraLAX  or Colace while taking it.  This medication contains both hydrocodone  and acetaminophen .  Do not take Tylenol  at the same time.  You can take the Norco or Tylenol  but not both.  I have sent lidocaine  patches to the pharmacy.  These can be worn for 12 hours at a time.  After 12 hours please remove it for 12 hours before replacing.  Please schedule follow-up appointment with Dr. Tobie whose information is attached.  Return to the emergency department with any worsening symptoms.

## 2024-01-27 ENCOUNTER — Other Ambulatory Visit: Payer: Self-pay | Admitting: Orthopedic Surgery

## 2024-01-27 DIAGNOSIS — M1712 Unilateral primary osteoarthritis, left knee: Secondary | ICD-10-CM

## 2024-01-30 ENCOUNTER — Inpatient Hospital Stay: Admission: RE | Admit: 2024-01-30 | Source: Ambulatory Visit

## 2024-01-30 DIAGNOSIS — M1712 Unilateral primary osteoarthritis, left knee: Secondary | ICD-10-CM

## 2024-01-30 DIAGNOSIS — G8929 Other chronic pain: Secondary | ICD-10-CM

## 2024-01-30 DIAGNOSIS — M2391 Unspecified internal derangement of right knee: Secondary | ICD-10-CM

## 2024-01-30 DIAGNOSIS — M2351 Chronic instability of knee, right knee: Secondary | ICD-10-CM

## 2024-01-31 ENCOUNTER — Ambulatory Visit: Admitting: Pain Medicine

## 2024-01-31 ENCOUNTER — Ambulatory Visit
Admission: RE | Admit: 2024-01-31 | Discharge: 2024-01-31 | Disposition: A | Discharge status: Home / Self Care | Source: Ambulatory Visit | Attending: Orthopedic Surgery | Admitting: Orthopedic Surgery

## 2024-01-31 DIAGNOSIS — G8929 Other chronic pain: Secondary | ICD-10-CM

## 2024-01-31 DIAGNOSIS — M2351 Chronic instability of knee, right knee: Secondary | ICD-10-CM

## 2024-01-31 DIAGNOSIS — M2391 Unspecified internal derangement of right knee: Secondary | ICD-10-CM

## 2024-02-09 ENCOUNTER — Ambulatory Visit: Admitting: Pain Medicine

## 2024-02-14 ENCOUNTER — Ambulatory Visit (HOSPITAL_BASED_OUTPATIENT_CLINIC_OR_DEPARTMENT_OTHER): Admitting: Pain Medicine

## 2024-02-14 ENCOUNTER — Ambulatory Visit
Admission: RE | Admit: 2024-02-14 | Discharge: 2024-02-14 | Disposition: A | Discharge status: Home / Self Care | Source: Ambulatory Visit | Attending: Pain Medicine | Admitting: Pain Medicine

## 2024-02-14 ENCOUNTER — Encounter: Payer: Self-pay | Admitting: Pain Medicine

## 2024-02-14 VITALS — BP 135/88 | HR 86 | Temp 98.1°F | Resp 16 | Ht 65.0 in | Wt 210.0 lb

## 2024-02-14 DIAGNOSIS — M47816 Spondylosis without myelopathy or radiculopathy, lumbar region: Secondary | ICD-10-CM

## 2024-02-14 DIAGNOSIS — G8929 Other chronic pain: Secondary | ICD-10-CM | POA: Insufficient documentation

## 2024-02-14 DIAGNOSIS — M5459 Other low back pain: Secondary | ICD-10-CM

## 2024-02-14 DIAGNOSIS — R937 Abnormal findings on diagnostic imaging of other parts of musculoskeletal system: Secondary | ICD-10-CM

## 2024-02-14 DIAGNOSIS — M545 Low back pain, unspecified: Secondary | ICD-10-CM | POA: Insufficient documentation

## 2024-02-14 DIAGNOSIS — M47817 Spondylosis without myelopathy or radiculopathy, lumbosacral region: Secondary | ICD-10-CM | POA: Insufficient documentation

## 2024-02-14 MED ORDER — LIDOCAINE HCL 2 % IJ SOLN
20.0000 mL | Freq: Once | INTRAMUSCULAR | Status: AC
  Administered 2024-02-14: 400 mg

## 2024-02-14 MED ORDER — LIDOCAINE HCL 2 % IJ SOLN
INTRAMUSCULAR | Status: AC
  Filled 2024-02-14: qty 20

## 2024-02-14 MED ORDER — MIDAZOLAM HCL 5 MG/5ML IJ SOLN
0.5000 mg | Freq: Once | INTRAMUSCULAR | Status: AC
  Administered 2024-02-14: 3 mg via INTRAVENOUS

## 2024-02-14 MED ORDER — FENTANYL CITRATE (PF) 100 MCG/2ML IJ SOLN
25.0000 ug | INTRAMUSCULAR | Status: DC | PRN
  Administered 2024-02-14: 50 ug via INTRAVENOUS

## 2024-02-14 MED ORDER — FENTANYL CITRATE (PF) 100 MCG/2ML IJ SOLN
INTRAMUSCULAR | Status: AC
  Filled 2024-02-14: qty 2

## 2024-02-14 MED ORDER — PENTAFLUOROPROP-TETRAFLUOROETH EX AERO
INHALATION_SPRAY | Freq: Once | CUTANEOUS | Status: AC
  Administered 2024-02-14: 30 via TOPICAL

## 2024-02-14 MED ORDER — MIDAZOLAM HCL 5 MG/5ML IJ SOLN
INTRAMUSCULAR | Status: AC
  Filled 2024-02-14: qty 5

## 2024-02-14 MED ORDER — ROPIVACAINE HCL 2 MG/ML IJ SOLN
INTRAMUSCULAR | Status: AC
  Filled 2024-02-14: qty 20

## 2024-02-14 MED ORDER — ROPIVACAINE HCL 2 MG/ML IJ SOLN
18.0000 mL | Freq: Once | INTRAMUSCULAR | Status: AC
  Administered 2024-02-14: 18 mL via PERINEURAL

## 2024-02-14 MED ORDER — TRIAMCINOLONE ACETONIDE 40 MG/ML IJ SUSP
80.0000 mg | Freq: Once | INTRAMUSCULAR | Status: AC
  Administered 2024-02-14: 80 mg

## 2024-02-14 MED ORDER — TRIAMCINOLONE ACETONIDE 40 MG/ML IJ SUSP
INTRAMUSCULAR | Status: AC
  Filled 2024-02-14: qty 2

## 2024-02-14 NOTE — Progress Notes (Signed)
 PROVIDER NOTE: Interpretation of information contained herein should be left to medically-trained personnel. Specific patient instructions are provided elsewhere under Patient Instructions section of medical record. This document was created in part using STT-dictation technology, any transcriptional errors that may result from this process are unintentional.  Patient: Cassidy Mathis Type: Established DOB: 11-06-1975 MRN: [id number] PCP: [facility]  Service: Procedure DOS: 02/14/2024 Setting: Ambulatory Location: Ambulatory outpatient facility Delivery: Face-to-face Provider: Eric DELENA Como, MD Specialty: Interventional Pain Management Specialty designation: 09 Location: Outpatient facility Ref. Prov.: Como Eric, MD       Interventional Therapy   Type: Lumbar Facet, Medial Branch Block(s)   #2  Laterality: Bilateral  Level: L3, L4, L5, and S1 Medial Branch/Dorsal Rami Level(s). Injecting these levels blocks the L4-5 and L5-S1 lumbar facet joints. Imaging: Fluoroscopic guidance Spinal ([id number]) Anesthesia: Local anesthesia (1-2% Lidocaine ) Anxiolysis: IV Versed  3.0 mg            Analgesia: No Sedation Fentanyl  1 mL (50 mcg) DOS: 02/14/2024 Performed by: Eric DELENA Como, MD  Primary Purpose: Diagnostic/Therapeutic Indications: Low back pain severe enough to impact quality of life or function. 1. Chronic low back pain (1ry area of Pain) (Bilateral) (L>R) w/o sciatica   2. Spondylosis without myelopathy or radiculopathy, lumbosacral region   3. Lumbar facet joint pain   4. Lumbosacral facet arthropathy   5. Lumbosacral facet syndrome (Bilateral)   6. Lumbar facet hypertrophy (L4-5) (Bilateral)   7. Abnormal MRI, lumbar spine (12/06/2021)    NAS-11 Pain score:   Pre-procedure: 8 /10   Post-procedure: 0-No pain/10     Position / Prep / Materials:  Position: Prone  Prep solution: ChloraPrep (2% chlorhexidine  gluconate and 70%  isopropyl alcohol) Area Prepped: Posterolateral Lumbosacral Spine (Wide prep: From the lower border of the scapula down to the end of the tailbone and from flank to flank.)  Materials:  Tray: Block Needle(s):  Type: Spinal  Gauge (G): 22  Length: 5-in Qty: 4     H&P (Pre-op Assessment):  [name] is a 49 y.o. (year old), female patient, seen today for interventional treatment. She  has a past surgical history that includes Tubal ligation; epigastric hernia repair (N/A, 04/18/2015); Hernia repair; Cholecystectomy (10/21/2015); ERCP (N/A, 12/21/2016); Cyst excision; Cholecystectomy (Bilateral); Cervical conization w/bx (N/A, 03/23/2017); Radical hysterectomy (04/14/2017); Salpingectomy (Bilateral, 04/14/2017); Sentinel lymph node biopsy; Abdominal hysterectomy; Lysis of adhesion (N/A, 01/01/2020); Laparoscopic salpingo oophorectomy (Right, 01/01/2020); Colonoscopy with propofol  (N/A, 12/06/2020); Esophagogastroduodenoscopy (12/06/2020); Givens capsule study (N/A, 12/08/2020); VISCERAL ANGIOGRAPHY (N/A, 12/11/2020); Knee arthroscopy with meniscal repair (Left, 05/27/2023); and Knee arthroscopy with lateral menisectomy (Right, 09/09/2023). [name] has a current medication list which includes the following prescription(s): acetaminophen , albuterol , amlodipine , hydrocodone -acetaminophen , losartan, and prednisone , and the following Facility-Administered Medications: fentanyl . Her primarily concern today is the Back Pain (lower)  Initial Vital Signs:  Pulse/HCG Rate: 86ECG Heart Rate: 63 (nsr) Temp: 98 F (36.7 C) Resp: 16 BP: (!) 140/80 SpO2: 100 %  BMI: Estimated body mass index is 34.95 kg/m as calculated from the following:   Height as of this encounter: 5' 5 (1.651 m).   Weight as of this encounter: 210 lb (95.3 kg).  Risk Assessment: Allergies: Reviewed. She is allergic to ace inhibitors and lisinopril .  Allergy Precautions: None required Coagulopathies: Reviewed. None identified.   Blood-thinner therapy: None at this time Active Infection(s): Reviewed. None identified. [name] is afebrile  Site Confirmation: [name] was asked to confirm the procedure and laterality before marking the site  Procedure checklist: Completed Consent: Before the procedure and under the influence of no sedative(s), amnesic(s), or anxiolytics, the patient was informed of the treatment options, risks and possible complications. To fulfill our ethical and legal obligations, as recommended by the American Medical Association's Code of Ethics, I have informed the patient of my clinical impression; the nature and purpose of the treatment or procedure; the risks, benefits, and possible complications of the intervention; the alternatives, including doing nothing; the risk(s) and benefit(s) of the alternative treatment(s) or procedure(s); and the risk(s) and benefit(s) of doing nothing. The patient was provided information about the general risks and possible complications associated with the procedure. These may include, but are not limited to: failure to achieve desired goals, infection, bleeding, organ or nerve damage, allergic reactions, paralysis, and death. In addition, the patient was informed of those risks and complications associated to Spine-related procedures, such as failure to decrease pain; infection (i.e.: Meningitis, epidural or intraspinal abscess); bleeding (i.e.: epidural hematoma, subarachnoid hemorrhage, or any other type of intraspinal or peri-dural bleeding); organ or nerve damage (i.e.: Any type of peripheral nerve, nerve root, or spinal cord injury) with subsequent damage to sensory, motor, and/or autonomic systems, resulting in permanent pain, numbness, and/or weakness of one or several areas of the body; allergic reactions; (i.e.: anaphylactic reaction); and/or death. Furthermore, the patient was informed of those risks and complications associated with the medications. These include,  but are not limited to: allergic reactions (i.e.: anaphylactic or anaphylactoid reaction(s)); adrenal axis suppression; blood sugar elevation that in diabetics may result in ketoacidosis or comma; water retention that in patients with history of congestive heart failure may result in shortness of breath, pulmonary edema, and decompensation with resultant heart failure; weight gain; swelling or edema; medication-induced neural toxicity; particulate matter embolism and blood vessel occlusion with resultant organ, and/or nervous system infarction; and/or aseptic necrosis of one or more joints. Finally, the patient was informed that Medicine is not an exact science; therefore, there is also the possibility of unforeseen or unpredictable risks and/or possible complications that may result in a catastrophic outcome. The patient indicated having understood very clearly. We have given the patient no guarantees and we have made no promises. Enough time was given to the patient to ask questions, all of which were answered to the patient's satisfaction. Ms. Dinh has indicated that she wanted to continue with the procedure. Attestation: I, the ordering provider, attest that I have discussed with the patient the benefits, risks, side-effects, alternatives, likelihood of achieving goals, and potential problems during recovery for the procedure that I have provided informed consent. Date  Time: 02/14/2024 10:18 AM  Pre-Procedure Preparation:  Monitoring: As per clinic protocol. Respiration, ETCO2, SpO2, BP, heart rate and rhythm monitor placed and checked for adequate function Safety Precautions: Patient was assessed for positional comfort and pressure points before starting the procedure. Time-out: I initiated and conducted the Time-out before starting the procedure, as per protocol. The patient was asked to participate by confirming the accuracy of the Time Out information. Verification of the correct person, site,  and procedure were performed and confirmed by me, the nursing staff, and the patient. Time-out conducted as per Joint Commission's Universal Protocol (UP.01.01.01). Time: 1101 Start Time: 1101 hrs.  Description of Procedure:          Laterality: (see above) Targeted Levels: (see above)  Safety Precautions: Aspiration looking for blood return was conducted prior to all injections. At no point did we inject any substances, as a needle  was being advanced. Before injecting, the patient was told to immediately notify me if she was experiencing any new onset of ringing in the ears, or metallic taste in the mouth. No attempts were made at seeking any paresthesias. Safe injection practices and needle disposal techniques used. Medications properly checked for expiration dates. SDV (single dose vial) medications used. After the completion of the procedure, all disposable equipment used was discarded in the proper designated medical waste containers. Local Anesthesia: Protocol guidelines were followed. The patient was positioned over the fluoroscopy table. The area was prepped in the usual manner. The time-out was completed. The target area was identified using fluoroscopy. A 12-in long, straight, sterile hemostat was used with fluoroscopic guidance to locate the targets for each level blocked. Once located, the skin was marked with an approved surgical skin marker. Once all sites were marked, the skin (epidermis, dermis, and hypodermis), as well as deeper tissues (fat, connective tissue and muscle) were infiltrated with a small amount of a short-acting local anesthetic, loaded on a 10cc syringe with a 25G, 1.5-in  Needle. An appropriate amount of time was allowed for local anesthetics to take effect before proceeding to the next step. Local Anesthetic: Lidocaine  2.0% The unused portion of the local anesthetic was discarded in the proper designated containers. Technical description of process:  Medial Branch   Dorsal Rami Nerve Block (MBB):  Neuroanatomy note: Each lumbar facet joint receives dual innervation from medial branches arising from the posterior primary rami at the same level and one level above. The target for each lumbar medial branch is the junction of the ipsilateral superior articular and transverse process of the lower vertebral body. (i.e.: The L4-L5 facet joint is innervated by the L4 medial branch [located at L5] and the L3 medial branch [located at L4]. Blocking the L4 Medial Branch is therefore achieved by injecting at the junction of the ipsilateral superior articular and transverse process of the lower vertebral body [L5].).  Exception: The exception to the above rule is the L5-S1 facet joint which has triple innervation requiring the L4 medial branch, as well as the L5 and the S1 Dorsal Rami(s) to be blocked to fully denervate the joint.  Under fluoroscopic guidance, a needle was inserted until contact was made with os over the target area. After negative aspiration, 0.5 mL of the nerve block solution was injected without difficulty or complication. Paresthesia were avoided during injection. The needle(s) were removed intact and without complication.  Once the entire procedure was completed, the treated area was cleaned, making sure to leave some of the prepping solution back to take advantage of its long term bactericidal properties.         Illustration of the posterior view of the lumbar spine and the posterior neural structures. Laminae of L2 through S1 are labeled. DPRL5, dorsal primary ramus of L5; DPRS1, dorsal primary ramus of S1; DPR3, dorsal primary ramus of L3; FJ, facet (zygapophyseal) joint L3-L4; I, inferior articular process of L4; LB1, lateral branch of dorsal primary ramus of L1; IAB, inferior articular branches from L3 medial branch (supplies L4-L5 facet joint); IBP, intermediate branch plexus; MB3, medial branch of dorsal primary ramus of L3; NR3, third lumbar nerve  root; S, superior articular process of L5; SAB, superior articular branches from L4 (supplies L4-5 facet joint also); TP3, transverse process of L3.   Facet Joint Innervation (* possible contribution)  L1-2 T12, L1 (L2*)  Medial Branch  L2-3 L1, L2 (L3*)  L3-4 L2, L3 (L4*)                     L4-5 L3, L4 (L5*)                     L5-S1 L4, L5, S1                        Vitals:   02/14/24 1110 02/14/24 1115 02/14/24 1126 02/14/24 1136  BP: (!) 136/96 (!) 134/97 132/87 135/88  Pulse:      Resp: 13 12 15 16   Temp:  98 F (36.7 C)  98.1 F (36.7 C)  SpO2: 100% 100% 100% 100%  Weight:      Height:         End Time: 1109 hrs.  Imaging Guidance (Spinal):         Type of Imaging Technique: Fluoroscopy Guidance (Spinal) Indication(s): Fluoroscopy guidance for needle placement to enhance accuracy in procedures requiring precise needle localization for targeted delivery of medication in or near specific anatomical locations not easily accessible without such real-time imaging assistance. Exposure Time: Please see nurses notes. Contrast: None used. Fluoroscopic Guidance: I was personally present during the use of fluoroscopy. Tunnel Vision Technique used to obtain the best possible view of the target area. Parallax error corrected before commencing the procedure. Direction-depth-direction technique used to introduce the needle under continuous pulsed fluoroscopy. Once target was reached, antero-posterior, oblique, and lateral fluoroscopic projection used confirm needle placement in all planes. Images permanently stored in EMR. Interpretation: No contrast injected. I personally interpreted the imaging intraoperatively. Adequate needle placement confirmed in multiple planes. Permanent images saved into the patient's record.  Post-operative Assessment:  Post-procedure Vital Signs:  Pulse/HCG Rate: 8679 Temp: 98.1 F (36.7 C) Resp: 16 BP: 135/88 SpO2: 100  %  EBL: None  Complications: No immediate post-treatment complications observed by team, or reported by patient.  Note: The patient tolerated the entire procedure well. A repeat set of vitals were taken after the procedure and the patient was kept under observation following institutional policy, for this type of procedure. Post-procedural neurological assessment was performed, showing return to baseline, prior to discharge. The patient was provided with post-procedure discharge instructions, including a section on how to identify potential problems. Should any problems arise concerning this procedure, the patient was given instructions to immediately contact us , at any time, without hesitation. In any case, we plan to contact the patient by telephone for a follow-up status report regarding this interventional procedure.  Comments:  No additional relevant information.  Plan of Care (POC)  Orders:  Orders Placed This Encounter  Procedures   LUMBAR FACET(MEDIAL BRANCH NERVE BLOCK) MBNB    Scheduling Instructions:     Procedure: Lumbar facet block (AKA.: Lumbosacral medial branch nerve block)     Side: Bilateral     Level: L4-5 and L5-S1 Facets (L3, L4, L5, and S1 Medial Branch Nerves)     Sedation: Patient's choice.     Date: 02/14/2024    Where will this procedure be performed?:   [facility]   DG [facility] C-ARM 1-60 MIN NO REPORT    Intraoperative interpretation by procedural physician at [facility].    Standing Status:   Standing    Number of Occurrences:   1    Reason for exam::   Assistance in needle guidance and placement for procedures requiring needle placement in or near specific anatomical  locations not easily accessible without such assistance.   Informed Consent Details: Physician/Practitioner Attestation; Transcribe to consent form and obtain patient signature    Nursing Order: Transcribe to consent form and obtain patient signature. Note: Always confirm  laterality of pain with Ms. Bratton, before procedure.    Physician/Practitioner attestation of informed consent for procedure/surgical case:   I, the physician/practitioner, attest that I have discussed with the patient the benefits, risks, side effects, alternatives, likelihood of achieving goals and potential problems during recovery for the procedure that I have provided informed consent.    Procedure:   Lumbar Facet Block  under fluoroscopic guidance    Physician/Practitioner performing the procedure:   Moncerrat Burnstein A. Tanya MD    Indication/Reason:   Low Back Pain, with our without leg pain, due to Facet Joint Arthralgia (Joint Pain) Spondylosis (Arthritis of the Spine), without myelopathy or radiculopathy (Nerve Damage).   Provide equipment / supplies at bedside    Procedure tray: Block Tray (Disposable  single use) Skin infiltration needle: Regular 1.5-in, 25-G, (x1) Block Needle type: Spinal Amount/quantity: 4 Size: Medium (5-inch) Gauge: 22G    Standing Status:   Standing    Number of Occurrences:   1    Specify:   Block Tray   Saline lock IV    Have LR [phone] available and administer at 125 mL/hr if patient becomes hypotensive.    Standing Status:   Standing    Number of Occurrences:   1     No chronic opioid analgesics therapy prescribed by our practice. Hydrocodone /APAP 5/325, 1 tab p.o. 4 times daily (#10) (last filled 08/08/2022) MME/day: 20 mg/day    Medications ordered for procedure: Meds ordered this encounter  Medications   lidocaine  (XYLOCAINE ) 2 % (with pres) injection 400 mg   pentafluoroprop-tetrafluoroeth (GEBAUERS) aerosol   midazolam  (VERSED ) 5 MG/5ML injection 0.5-2 mg    Make sure Flumazenil is available in the pyxis when using this medication. If oversedation occurs, administer 0.2 mg IV over 15 sec. If after 45 sec no response, administer 0.2 mg again over 1 min; may repeat at 1 min intervals; not to exceed 4 doses (1 mg)   fentaNYL  (SUBLIMAZE )  injection 25-50 mcg    Make sure Narcan is available in the pyxis when using this medication. In the event of respiratory depression (RR< 8/min): Titrate NARCAN (naloxone) in increments of 0.1 to 0.2 mg IV at 2-3 minute intervals, until desired degree of reversal.   ropivacaine  (PF) 2 mg/mL (0.2%) (NAROPIN ) injection 18 mL   triamcinolone  acetonide (KENALOG -40) injection 80 mg   Medications administered: We administered lidocaine , pentafluoroprop-tetrafluoroeth, midazolam , fentaNYL , ropivacaine  (PF) 2 mg/mL (0.2%), and triamcinolone  acetonide.  See the medical record for exact dosing, route, and time of administration.    Interventional Therapies  Risk  Complexity Considerations:   Abnormal UDS  GERD  nicotine dependence  hypertension     Planned  Pending:   Diagnostic bilateral lumbar facet MBB x2    Under consideration:   Diagnostic/Therapeutic midline L5-S1 LESI #1  Diagnostic bilateral lumbar facet MBB #2  Diagnostic bilateral sacroiliac joint block #1  Diagnostic bilateral IA hip joint injection #1  Diagnostic bilateral trochanteric bursa injection #1    Completed:   Diagnostic/Therapeutic left L3-4 LESI x1 (02/03/2023) (100/90/0.  The patient fell onto her left side a couple days after her treatment.) Diagnostic left L4-5 LESI x2 (12/15/2021) (10-0/10) (1st:100/100/80 x1 week/0) (10-0/10) (2nd:100/100/60 x1 week/0)  Diagnostic bilateral lumbar facet MBB x1 (10/05/2022) (8-0/10) (100/100/100  x 3 days/10-20)  PT referral (12/29/2020) Referral to physical therapy entered to again (10/19/2022)    Completed by Delon Gins, MD [facility] PMR):   Diagnostic/therapeutic bilateral L5 TFESI x2 (06/04/2019 & 08/02/2019) (no benefit & painful)    Therapeutic  Palliative (PRN) options:   None established      Follow-up plan:   Return in about 2 weeks (around 02/28/2024) for (Face2F), (PPE).     Recent Visits Date Type Provider Dept  01/09/24 Office Visit Tanya Glisson, MD [facility]  Showing recent visits within past 90 days and meeting all other requirements Today's Visits Date Type Provider Dept  02/14/24 Procedure visit Tanya Glisson, MD [facility]  Showing today's visits and meeting all other requirements Future Appointments Date Type Provider Dept  03/05/24 Appointment Tanya Glisson, MD [facility]  Showing future appointments within next 90 days and meeting all other requirements   Disposition: Discharge home  Discharge (Date  Time): 02/14/2024; 1138 hrs.   Primary Care Physician: [facility] Location: [facility] Note by: Glisson DELENA Tanya, MD (TTS technology used. I apologize for any typographical errors that were not detected and corrected.) Date: 02/14/2024; Time: 11:51 AM  Disclaimer:  Medicine is not an visual merchandiser. The only guarantee in medicine is that nothing is guaranteed. It is important to note that the decision to proceed with this intervention was based on the information collected from the patient. The Data and conclusions were drawn from the patient's questionnaire, the interview, and the physical examination. Because the information was provided in large part by the patient, it cannot be guaranteed that it has not been purposely or unconsciously manipulated. Every effort has been made to obtain as much relevant data as possible for this evaluation. It is important to note that the conclusions that lead to this procedure are derived in large part from the available data. Always take into account that the treatment will also be dependent on availability of resources and existing treatment guidelines, considered by other Pain Management Practitioners as being common knowledge and practice, at the time of the intervention. For Medico-Legal purposes, it is also important to point out that variation in procedural techniques and  pharmacological choices are the acceptable norm. The indications, contraindications, technique, and results of the above procedure should only be interpreted and judged by a Board-Certified Interventional Pain Specialist with extensive familiarity and expertise in the same exact procedure and technique.

## 2024-02-14 NOTE — Patient Instructions (Signed)
 ______________________________________________________________________    Post-Procedure Discharge Instructions  INSTRUCTIONS Apply ice:  Purpose: This will minimize any swelling and discomfort after procedure.  When: Day of procedure, as soon as you get home. How: Fill a plastic sandwich bag with crushed ice. Cover it with a small towel and apply to injection site. How long: (15 min on, 15 min off) Apply for 15 minutes then remove x 15 minutes.  Repeat sequence on day of procedure, until you go to bed. Apply heat:  Purpose: To treat any soreness and discomfort from the procedure. When: Starting the next day after the procedure. How: Apply heat to procedure site starting the day following the procedure. How long: May continue to repeat daily, until discomfort goes away. Food intake: Start with clear liquids (like water) and advance to regular food, as tolerated.  Physical activities: Keep activities to a minimum for the first 8 hours after the procedure. After that, then as tolerated. Driving: If you have received any sedation, be responsible and do not drive. You are not allowed to drive for 24 hours after having sedation. Blood thinner: (Applies only to those taking blood thinners) You may restart your blood thinner 6 hours after your procedure. Insulin: (Applies only to Diabetic patients taking insulin) As soon as you can eat, you may resume your normal dosing schedule. Infection prevention: Keep procedure site clean and dry. Shower daily and clean area with soap and water.  PAIN DIARY Post-procedure Pain Diary: Extremely important that this be done correctly and accurately. Recorded information will be used to determine the next step in treatment. For the purpose of accuracy, follow these rules: Evaluate only the area treated. Do not report or include pain from an untreated area. For the purpose of this evaluation, ignore all other areas of pain, except for the treated area. After your  procedure, avoid taking a long nap and attempting to complete the pain diary after you wake up. Instead, set your alarm clock to go off every hour, on the hour, for the initial 8 hours after the procedure. Document the duration of the numbing medicine, and the relief you are getting from it. Do not go to sleep and attempt to complete it later. It will not be accurate. If you received sedation, it is likely that you were given a medication that may cause amnesia. Because of this, completing the diary at a later time may cause the information to be inaccurate. This information is needed to plan your care. Follow-up appointment: Keep your post-procedure follow-up evaluation appointment after the procedure (usually 2 weeks for most procedures, 6 weeks for radiofrequencies). DO NOT FORGET to bring you pain diary with you.   EXPECT... (What should I expect to see with my procedure?) From numbing medicine (AKA: Local Anesthetics): Numbness or decrease in pain. You may also experience some weakness, which if present, could last for the duration of the local anesthetic. Onset: Full effect within 15 minutes of injected. Duration: It will depend on the type of local anesthetic used. On the average, 1 to 8 hours.  From steroids (Applies only if steroids were used): Decrease in swelling or inflammation. Once inflammation is improved, relief of the pain will follow. Onset of benefits: Depends on the amount of swelling present. The more swelling, the longer it will take for the benefits to be seen. In some cases, up to 10 days. Duration: Steroids will stay in the system x 2 weeks. Duration of benefits will depend on multiple posibilities including persistent irritating  factors. Side-effects: If present, they may typically last 2 weeks (the duration of the steroids). Frequent: Cramps (if they occur, drink Gatorade and take over-the-counter Magnesium 450-500 mg once to twice a day); water retention with temporary weight  gain; increases in blood sugar; decreased immune system response; increased appetite. Occasional: Facial flushing (red, warm cheeks); mood swings; menstrual changes. Uncommon: Long-term decrease or suppression of natural hormones; bone thinning. (These are more common with higher doses or more frequent use. This is why we prefer that our patients avoid having any injection therapies in other practices.)  Very Rare: Severe mood changes; psychosis; aseptic necrosis. From procedure: Some discomfort is to be expected once the numbing medicine wears off. This should be minimal if ice and heat are applied as instructed.  CALL IF... (When should I call?) You experience numbness and weakness that gets worse with time, as opposed to wearing off. New onset bowel or bladder incontinence. (Applies only to procedures done in the spine)  Emergency Numbers: Durning business hours (Monday - Thursday, 8:00 AM - 4:00 PM) (Friday, 9:00 AM - 12:00 Noon): [phone] After hours: [phone] NOTE: If you are having a problem and are unable connect with, or to talk to a provider, then go to your nearest urgent care or emergency department. If the problem is serious and urgent, please call 911. ______________________________________________________________________     ______________________________________________________________________    Steroid injections  Common steroids for injections Triamcinolone : Used by many sports medicine physicians for large joint and bursal injections, often combined with a local anesthetic like lidocaine . A study focusing on coccydynia (tailbone pain) found triamcinolone  was more effective than betamethasone, suggesting it may also be preferable for other localized inflammation conditions. Methylprednisolone: A common alternative to triamcinolone  that is also a strong anti-inflammatory. It is available in different formulations, with the acetate suspension being the long-acting  option for intra-articular injections. Dexamethasone : This is a non-particulate steroid, meaning it has a lower risk of tissue damage compared to particulate steroids like triamcinolone  and methylprednisolone. While less common for this specific use, it is an option for targeted injections.   Considerations for physicians Particulate vs. non-particulate steroids: Triamcinolone  and methylprednisolone are particulate, meaning they can clump together. Dexamethasone  is non-particulate. Particulate steroids are often preferred for their longer-lasting effects but carry a theoretical higher risk for certain injections (though this is less of a concern in the costochondral joints). Combined injectate: Corticosteroids are typically mixed with a local anesthetic like lidocaine  to provide both immediate pain relief (from the anesthetic) and longer-term inflammation reduction (from the steroid). Imaging guidance: To ensure accurate placement of the needle and medication, physicians may use ultrasound or fluoroscopic guidance for the injection, especially in complex or refractory cases.   Patient guidance Before undergoing a steroid injection, discuss the options with your physician. They will determine the best steroid, dosage, and procedure for your specific case based on factors like: Severity of your condition History of response to other treatments Your overall health status Experience and preference of the physician  Last  Updated: 09/20/2023 ______________________________________________________________________

## 2024-02-14 NOTE — Progress Notes (Signed)
 Safety precautions to be maintained throughout the outpatient stay will include: orient to surroundings, keep bed in low position, maintain call bell within reach at all times, provide assistance with transfer out of bed and ambulation.

## 2024-02-15 ENCOUNTER — Telehealth: Payer: Self-pay

## 2024-02-15 NOTE — Telephone Encounter (Signed)
 No issues post-procedure.

## 2024-03-05 ENCOUNTER — Ambulatory Visit: Admitting: Pain Medicine
